# Patient Record
Sex: Male | Born: 1945 | Race: Black or African American | Hispanic: No | Smoking: Former smoker
Health system: Southern US, Community
[De-identification: ages and names within clinical notes are randomized; demographics above are authoritative.]

## PROBLEM LIST (undated history)

## (undated) DIAGNOSIS — R972 Elevated prostate specific antigen [PSA]: Secondary | ICD-10-CM

## (undated) DIAGNOSIS — M899 Disorder of bone, unspecified: Secondary | ICD-10-CM

## (undated) DIAGNOSIS — I10 Essential (primary) hypertension: Secondary | ICD-10-CM

## (undated) DIAGNOSIS — R945 Abnormal results of liver function studies: Secondary | ICD-10-CM

## (undated) DIAGNOSIS — R609 Edema, unspecified: Secondary | ICD-10-CM

## (undated) DIAGNOSIS — H547 Unspecified visual loss: Secondary | ICD-10-CM

## (undated) DIAGNOSIS — E876 Hypokalemia: Secondary | ICD-10-CM

## (undated) DIAGNOSIS — M949 Disorder of cartilage, unspecified: Secondary | ICD-10-CM

## (undated) DIAGNOSIS — H409 Unspecified glaucoma: Secondary | ICD-10-CM

## (undated) HISTORY — DX: Unspecified glaucoma: H40.9

## (undated) HISTORY — DX: Abnormal results of liver function studies: R94.5

## (undated) HISTORY — DX: Disorder of bone, unspecified: M89.9

## (undated) HISTORY — DX: Hypokalemia: E87.6

## (undated) HISTORY — DX: Disorder of cartilage, unspecified: M94.9

## (undated) HISTORY — DX: Edema, unspecified: R60.9

## (undated) HISTORY — DX: Essential (primary) hypertension: I10

## (undated) HISTORY — DX: Elevated prostate specific antigen (PSA): R97.20

## (undated) NOTE — *Deleted (*Deleted)
Pt called EMS complaining of clogged suprapubic catheter, legs swollen. Pt taking keflex, took last dose today. Pt not reporting any pain.  BP 150/98 HR 77 O2 98 % RA, R 12, CBG 99.

---

## 2001-06-05 HISTORY — PX: HERNIA REPAIR: SHX51

## 2007-06-06 HISTORY — PX: EYE SURGERY: SHX253

## 2011-08-11 DIAGNOSIS — H179 Unspecified corneal scar and opacity: Secondary | ICD-10-CM | POA: Diagnosis not present

## 2011-08-11 DIAGNOSIS — H409 Unspecified glaucoma: Secondary | ICD-10-CM | POA: Diagnosis not present

## 2011-08-11 DIAGNOSIS — H538 Other visual disturbances: Secondary | ICD-10-CM | POA: Diagnosis not present

## 2011-08-11 DIAGNOSIS — H4011X0 Primary open-angle glaucoma, stage unspecified: Secondary | ICD-10-CM | POA: Diagnosis not present

## 2011-08-11 DIAGNOSIS — H2510 Age-related nuclear cataract, unspecified eye: Secondary | ICD-10-CM | POA: Diagnosis not present

## 2011-08-14 DIAGNOSIS — H409 Unspecified glaucoma: Secondary | ICD-10-CM | POA: Diagnosis not present

## 2011-08-14 DIAGNOSIS — H2510 Age-related nuclear cataract, unspecified eye: Secondary | ICD-10-CM | POA: Diagnosis not present

## 2011-08-14 DIAGNOSIS — H4011X0 Primary open-angle glaucoma, stage unspecified: Secondary | ICD-10-CM | POA: Diagnosis not present

## 2011-08-14 DIAGNOSIS — H179 Unspecified corneal scar and opacity: Secondary | ICD-10-CM | POA: Diagnosis not present

## 2011-08-14 DIAGNOSIS — H401190 Primary open-angle glaucoma, unspecified eye, stage unspecified: Secondary | ICD-10-CM | POA: Insufficient documentation

## 2011-08-22 DIAGNOSIS — I10 Essential (primary) hypertension: Secondary | ICD-10-CM | POA: Diagnosis not present

## 2011-08-22 DIAGNOSIS — Z1329 Encounter for screening for other suspected endocrine disorder: Secondary | ICD-10-CM | POA: Diagnosis not present

## 2011-08-22 DIAGNOSIS — Z131 Encounter for screening for diabetes mellitus: Secondary | ICD-10-CM | POA: Diagnosis not present

## 2011-08-22 DIAGNOSIS — Z23 Encounter for immunization: Secondary | ICD-10-CM | POA: Diagnosis not present

## 2011-08-22 DIAGNOSIS — Z1211 Encounter for screening for malignant neoplasm of colon: Secondary | ICD-10-CM | POA: Diagnosis not present

## 2011-08-22 DIAGNOSIS — Z833 Family history of diabetes mellitus: Secondary | ICD-10-CM | POA: Diagnosis not present

## 2011-08-22 DIAGNOSIS — R609 Edema, unspecified: Secondary | ICD-10-CM | POA: Diagnosis not present

## 2011-09-05 ENCOUNTER — Other Ambulatory Visit: Payer: Self-pay | Admitting: Internal Medicine

## 2011-09-05 DIAGNOSIS — I10 Essential (primary) hypertension: Secondary | ICD-10-CM | POA: Diagnosis not present

## 2011-09-05 DIAGNOSIS — K760 Fatty (change of) liver, not elsewhere classified: Secondary | ICD-10-CM

## 2011-09-07 ENCOUNTER — Other Ambulatory Visit: Payer: Self-pay | Admitting: Internal Medicine

## 2011-09-07 ENCOUNTER — Ambulatory Visit
Admission: RE | Admit: 2011-09-07 | Discharge: 2011-09-07 | Disposition: A | Discharge status: Home / Self Care | Payer: Medicare Other | Source: Ambulatory Visit | Attending: Internal Medicine | Admitting: Internal Medicine

## 2011-09-07 DIAGNOSIS — K760 Fatty (change of) liver, not elsewhere classified: Secondary | ICD-10-CM

## 2011-09-07 DIAGNOSIS — R7989 Other specified abnormal findings of blood chemistry: Secondary | ICD-10-CM | POA: Diagnosis not present

## 2011-09-19 DIAGNOSIS — R945 Abnormal results of liver function studies: Secondary | ICD-10-CM | POA: Diagnosis not present

## 2011-09-19 DIAGNOSIS — H409 Unspecified glaucoma: Secondary | ICD-10-CM | POA: Diagnosis not present

## 2011-09-19 DIAGNOSIS — R609 Edema, unspecified: Secondary | ICD-10-CM | POA: Diagnosis not present

## 2011-09-19 DIAGNOSIS — I10 Essential (primary) hypertension: Secondary | ICD-10-CM | POA: Diagnosis not present

## 2012-01-02 DIAGNOSIS — R972 Elevated prostate specific antigen [PSA]: Secondary | ICD-10-CM | POA: Diagnosis not present

## 2012-01-02 DIAGNOSIS — E876 Hypokalemia: Secondary | ICD-10-CM | POA: Diagnosis not present

## 2012-01-02 DIAGNOSIS — R609 Edema, unspecified: Secondary | ICD-10-CM | POA: Diagnosis not present

## 2012-01-02 DIAGNOSIS — I10 Essential (primary) hypertension: Secondary | ICD-10-CM | POA: Diagnosis not present

## 2012-06-05 HISTORY — PX: EYE SURGERY: SHX253

## 2012-09-12 DIAGNOSIS — H40159 Residual stage of open-angle glaucoma, unspecified eye: Secondary | ICD-10-CM | POA: Diagnosis not present

## 2012-09-12 DIAGNOSIS — H4011X0 Primary open-angle glaucoma, stage unspecified: Secondary | ICD-10-CM | POA: Diagnosis not present

## 2012-09-12 DIAGNOSIS — H3520 Other non-diabetic proliferative retinopathy, unspecified eye: Secondary | ICD-10-CM | POA: Diagnosis not present

## 2012-09-12 DIAGNOSIS — H35039 Hypertensive retinopathy, unspecified eye: Secondary | ICD-10-CM | POA: Diagnosis not present

## 2012-09-12 DIAGNOSIS — H2510 Age-related nuclear cataract, unspecified eye: Secondary | ICD-10-CM | POA: Diagnosis not present

## 2012-09-12 DIAGNOSIS — H409 Unspecified glaucoma: Secondary | ICD-10-CM | POA: Diagnosis not present

## 2012-09-12 DIAGNOSIS — B0052 Herpesviral keratitis: Secondary | ICD-10-CM | POA: Diagnosis not present

## 2012-09-12 DIAGNOSIS — H35059 Retinal neovascularization, unspecified, unspecified eye: Secondary | ICD-10-CM | POA: Diagnosis not present

## 2012-09-12 DIAGNOSIS — H11159 Pinguecula, unspecified eye: Secondary | ICD-10-CM | POA: Diagnosis not present

## 2012-09-17 ENCOUNTER — Ambulatory Visit (INDEPENDENT_AMBULATORY_CARE_PROVIDER_SITE_OTHER): Payer: Medicare Other | Admitting: Internal Medicine

## 2012-09-17 ENCOUNTER — Encounter: Payer: Self-pay | Admitting: Internal Medicine

## 2012-09-17 VITALS — BP 136/88 | HR 70 | Temp 97.8°F | Resp 15 | Wt 217.4 lb

## 2012-09-17 DIAGNOSIS — R972 Elevated prostate specific antigen [PSA]: Secondary | ICD-10-CM | POA: Diagnosis not present

## 2012-09-17 DIAGNOSIS — R609 Edema, unspecified: Secondary | ICD-10-CM | POA: Diagnosis not present

## 2012-09-17 DIAGNOSIS — I10 Essential (primary) hypertension: Secondary | ICD-10-CM

## 2012-09-17 DIAGNOSIS — Z111 Encounter for screening for respiratory tuberculosis: Secondary | ICD-10-CM

## 2012-09-17 LAB — TB SKIN TEST: Induration: 0 mm

## 2012-09-17 NOTE — Progress Notes (Signed)
  Subjective:    Patient ID: [name] [name]., male    DOB: Jan 06, 1946, 67 y.o.   MRN: [id number]  HPI  He has been having eye problem and follows with Dr Luciana Axe. He is scheduled for surgery and needs PPD placed and read prior to that. On review of dr rankin's lab request form, appears PPD is wanted wit concerns for sarcoidosis changes seen on retinal exam. He has been taking his blood pressure medications and his bp has been under better control. He has swelling in both his legs mainly towards end of day and improves with leg elevation. He denies any chest pain or SOB He has refused lab work and urology follow up with rectal exam for more than a year. He agrees for rectal exam during his physical next visit.  Review of Systems  Constitutional: Negative for fever, chills, diaphoresis, activity change and appetite change.  HENT: Negative for congestion.   Eyes: Negative for pain.  Respiratory: Negative for cough and shortness of breath.   Cardiovascular: Negative for chest pain, palpitations and leg swelling.  Gastrointestinal: Negative for abdominal pain.  Endocrine: Negative for cold intolerance and polydipsia.  Genitourinary: Negative for dysuria.  Musculoskeletal: Negative for arthralgias.  Neurological: Negative for dizziness, syncope and light-headedness.  Hematological: Negative for adenopathy.  Psychiatric/Behavioral: Negative for behavioral problems and agitation.       Objective:   Physical Exam  Constitutional: He is oriented to person, place, and time. He appears well-developed and well-nourished. No distress.  HENT:  Head: Normocephalic and atraumatic.  Mouth/Throat: Oropharynx is clear and moist.  Neck: Normal range of motion. Neck supple. No JVD present. No thyromegaly present.  Cardiovascular: Normal rate and regular rhythm.   Pulmonary/Chest: Effort normal and breath sounds normal.  Abdominal: Soft. Bowel sounds are normal.  Musculoskeletal: Normal range of  motion. He exhibits edema.  Lymphadenopathy:    He has no cervical adenopathy.  Neurological: He is alert and oriented to person, place, and time.  Skin: Skin is warm and dry. He is not diaphoretic.  Psychiatric: He has a normal mood and affect.    BP 136/88  Pulse 70  Temp(Src) 97.8 F (36.6 C) (Oral)  Resp 15  Wt 217 lb 6.4 oz (98.612 kg)      Assessment & Plan:   HTN- continue current dose of hctz, pt agrees for lab work today. With history of hypokalemia and long standing htn, check bmp for renal function and electrolytes. Will also check cbc to rule out anemia  Edema- could from side effect from hctz. No SOB or chest pain. No other cardiac findings on exam. Will check bmp and consider putting him on low dose lasix. Will get ekg on next visit and assess further  Elevated PSA- a year back had elevated PSA, refused rectal exam and self cancelled 2 urology appointment. He agrees for a repeat psa today and rectal exam on his next visit which is for physical Will need to assess for possible mass/malignancy vs BPH  Glaucoma- continue timolol and alphagan for now.  PPD has been placed and will read it on Thursday.  Deranged LFT- recheck lft panel today

## 2012-09-18 LAB — COMPREHENSIVE METABOLIC PANEL
ALT: 59 IU/L — ABNORMAL HIGH (ref 0–44)
AST: 59 IU/L — ABNORMAL HIGH (ref 0–40)
Albumin/Globulin Ratio: 1.4 (ref 1.1–2.5)
Albumin: 4.1 g/dL (ref 3.6–4.8)
Alkaline Phosphatase: 62 IU/L (ref 39–117)
BUN/Creatinine Ratio: 13 (ref 10–22)
BUN: 13 mg/dL (ref 8–27)
CO2: 28 mmol/L (ref 19–28)
Calcium: 9.4 mg/dL (ref 8.6–10.2)
Chloride: 97 mmol/L (ref 97–108)
Creatinine, Ser: 0.97 mg/dL (ref 0.76–1.27)
GFR calc Af Amer: 94 mL/min/{1.73_m2} (ref >59)
GFR calc non Af Amer: 81 mL/min/{1.73_m2} (ref >59)
Globulin, Total: 3 g/dL (ref 1.5–4.5)
Glucose: 94 mg/dL (ref 65–99)
Potassium: 3.8 mmol/L (ref 3.5–5.2)
Sodium: 137 mmol/L (ref 134–144)
Total Bilirubin: 0.8 mg/dL (ref 0.0–1.2)
Total Protein: 7.1 g/dL (ref 6.0–8.5)

## 2012-09-18 LAB — CBC WITH DIFFERENTIAL/PLATELET
Basophils Absolute: 0 10*3/uL (ref 0.0–0.2)
Basos: 0 % (ref 0–3)
Eos: 2 % (ref 0–5)
Eosinophils Absolute: 0.1 10*3/uL (ref 0.0–0.4)
HCT: 41.3 % (ref 37.5–51.0)
Hemoglobin: 14.2 g/dL (ref 12.6–17.7)
Immature Grans (Abs): 0 10*3/uL (ref 0.0–0.1)
Immature Granulocytes: 0 % (ref 0–2)
Lymphocytes Absolute: 2 10*3/uL (ref 0.7–3.1)
Lymphs: 37 % (ref 14–46)
MCH: 31.8 pg (ref 26.6–33.0)
MCHC: 34.4 g/dL (ref 31.5–35.7)
MCV: 92 fL (ref 79–97)
Monocytes Absolute: 0.5 10*3/uL (ref 0.1–0.9)
Monocytes: 9 % (ref 4–12)
Neutrophils Absolute: 2.7 10*3/uL (ref 1.4–7.0)
Neutrophils Relative %: 52 % (ref 40–74)
RBC: 4.47 x10E6/uL (ref 4.14–5.80)
RDW: 12.6 % (ref 12.3–15.4)
WBC: 5.3 10*3/uL (ref 3.4–10.8)

## 2012-09-18 LAB — PSA: PSA: 14.1 ng/mL — ABNORMAL HIGH (ref 0.0–4.0)

## 2012-09-23 DIAGNOSIS — H3520 Other non-diabetic proliferative retinopathy, unspecified eye: Secondary | ICD-10-CM | POA: Diagnosis not present

## 2012-10-02 ENCOUNTER — Telehealth: Payer: Self-pay | Admitting: *Deleted

## 2012-10-02 NOTE — Telephone Encounter (Signed)
Called patient to make him aware of his appointment at [facility] # [phone] on 10/23/2012 @ 8:15am and told patient that they told me he had already Canceled 2 previous appointment and this would be his last appointment scheduled. He stated that I shouldn't dictate that he couldn't cancel the appointment and that he wanted it else where. Told patient that this was the only [facility] in [place] and he stated that is couldn't be.Wanted to argue about why he had to go and I told him that his PSA was elevated and he needed to get it evaluated and he stated that it always ran high and that was not abnormal. Also said he was driving and could not take the information down. I told patient that I would mail him the appointment. He agreed. Mailed appointment today.

## 2012-10-03 ENCOUNTER — Telehealth: Payer: Self-pay | Admitting: *Deleted

## 2012-10-03 NOTE — Telephone Encounter (Signed)
Patient called and stated that her have two eye appointment bills that he has to pay, so he canceled his appointment with the Urologist

## 2012-10-03 NOTE — Telephone Encounter (Signed)
He needs to keep the appointment given his rising PSA level. If he is refusing, we can get renal ultrasound with comment being to evaluate size of prostate if patient agrees. thanks

## 2012-10-03 NOTE — Telephone Encounter (Signed)
LMOVM  To call back

## 2012-10-04 NOTE — Telephone Encounter (Signed)
LMOVM to call back 

## 2012-10-07 NOTE — Telephone Encounter (Signed)
LMOVM to call back 

## 2012-10-08 NOTE — Telephone Encounter (Signed)
Per Dr Glade Lloyd I do not have too contact patient any more since I have tried calling him 4 times and left messages all four times

## 2012-10-08 NOTE — Telephone Encounter (Signed)
LMOVM to call back 

## 2012-10-09 ENCOUNTER — Ambulatory Visit: Payer: Self-pay | Admitting: Internal Medicine

## 2012-10-14 DIAGNOSIS — H409 Unspecified glaucoma: Secondary | ICD-10-CM | POA: Diagnosis not present

## 2012-10-14 DIAGNOSIS — H179 Unspecified corneal scar and opacity: Secondary | ICD-10-CM | POA: Diagnosis not present

## 2012-10-14 DIAGNOSIS — H40159 Residual stage of open-angle glaucoma, unspecified eye: Secondary | ICD-10-CM | POA: Diagnosis not present

## 2012-10-14 DIAGNOSIS — H4011X0 Primary open-angle glaucoma, stage unspecified: Secondary | ICD-10-CM | POA: Diagnosis not present

## 2012-12-17 ENCOUNTER — Encounter (HOSPITAL_COMMUNITY): Payer: Self-pay

## 2012-12-17 ENCOUNTER — Emergency Department (HOSPITAL_COMMUNITY)
Admission: EM | Admit: 2012-12-17 | Discharge: 2012-12-17 | Disposition: A | Discharge status: Home / Self Care | Payer: Medicare Other | Attending: Emergency Medicine | Admitting: Emergency Medicine

## 2012-12-17 DIAGNOSIS — Z87448 Personal history of other diseases of urinary system: Secondary | ICD-10-CM | POA: Insufficient documentation

## 2012-12-17 DIAGNOSIS — Z79899 Other long term (current) drug therapy: Secondary | ICD-10-CM | POA: Diagnosis not present

## 2012-12-17 DIAGNOSIS — R339 Retention of urine, unspecified: Secondary | ICD-10-CM

## 2012-12-17 DIAGNOSIS — N390 Urinary tract infection, site not specified: Secondary | ICD-10-CM | POA: Diagnosis not present

## 2012-12-17 DIAGNOSIS — Z8719 Personal history of other diseases of the digestive system: Secondary | ICD-10-CM | POA: Insufficient documentation

## 2012-12-17 DIAGNOSIS — R319 Hematuria, unspecified: Secondary | ICD-10-CM | POA: Diagnosis not present

## 2012-12-17 DIAGNOSIS — Z8739 Personal history of other diseases of the musculoskeletal system and connective tissue: Secondary | ICD-10-CM | POA: Insufficient documentation

## 2012-12-17 DIAGNOSIS — H409 Unspecified glaucoma: Secondary | ICD-10-CM | POA: Insufficient documentation

## 2012-12-17 DIAGNOSIS — R338 Other retention of urine: Secondary | ICD-10-CM | POA: Diagnosis not present

## 2012-12-17 DIAGNOSIS — I10 Essential (primary) hypertension: Secondary | ICD-10-CM | POA: Insufficient documentation

## 2012-12-17 DIAGNOSIS — Z87891 Personal history of nicotine dependence: Secondary | ICD-10-CM | POA: Diagnosis not present

## 2012-12-17 DIAGNOSIS — Z8679 Personal history of other diseases of the circulatory system: Secondary | ICD-10-CM | POA: Insufficient documentation

## 2012-12-17 LAB — URINALYSIS, ROUTINE W REFLEX MICROSCOPIC
Glucose, UA: 100 mg/dL — AB
Ketones, ur: >80 mg/dL — AB
Nitrite: POSITIVE — AB
Protein, ur: >300 mg/dL — AB
Specific Gravity, Urine: 1.031 — ABNORMAL HIGH (ref 1.005–1.030)
Urobilinogen, UA: 4 mg/dL — ABNORMAL HIGH (ref 0.0–1.0)
pH: 5 (ref 5.0–8.0)

## 2012-12-17 LAB — URINE MICROSCOPIC-ADD ON

## 2012-12-17 MED ORDER — HYDROCODONE-ACETAMINOPHEN 5-325 MG PO TABS
1.0000 | ORAL_TABLET | Freq: Once | ORAL | Status: AC
Start: 2012-12-17 — End: 2012-12-17
  Administered 2012-12-17: 1 via ORAL
  Filled 2012-12-17: qty 1

## 2012-12-17 MED ORDER — CEPHALEXIN 500 MG PO CAPS: 500.0000 mg | ORAL_CAPSULE | Freq: Four times a day (QID) | ORAL | Status: DC

## 2012-12-17 MED ORDER — CEPHALEXIN 500 MG PO CAPS
500.0000 mg | ORAL_CAPSULE | Freq: Once | ORAL | Status: AC
  Administered 2012-12-17: 500 mg via ORAL
  Filled 2012-12-17: qty 1

## 2012-12-17 NOTE — ED Notes (Signed)
Foley cath bag changed to a leg bag per patient's request.

## 2012-12-17 NOTE — ED Notes (Signed)
Bladder Scan- 394 ml after voiding x2 with a total of 100 ml voided prior to scan.

## 2012-12-17 NOTE — ED Provider Notes (Signed)
History    CSN: [id number] Arrival date & time 12/17/12  0605  First MD Initiated Contact with Patient 12/17/12 0703     Chief Complaint  Patient presents with  . Hematuria   (Consider location/radiation/quality/duration/timing/severity/associated sxs/prior Treatment) Patient is a 67 y.o. male presenting with hematuria. The history is provided by the patient.  Hematuria This is a new problem. Episode onset: 3 days ago. The problem occurs constantly. The problem has been gradually worsening. Pertinent negatives include no chest pain, no abdominal pain and no shortness of breath. Nothing aggravates the symptoms. Nothing relieves the symptoms. He has tried nothing for the symptoms.  Gross hematuria started 3 days ago.  Associated with frequency and urgency.  Able to void small amounts frequently.  No fevers or abdominal pain.   Past Medical History  Diagnosis Date  . Hypopotassemia   . Elevated prostate specific antigen (PSA)   . Nonspecific abnormal results of liver function study   . Unspecified glaucoma(365.9)   . Hypertension   . Disorder of bone and cartilage, unspecified   . Edema    Past Surgical History  Procedure Laterality Date  . Hernia repair  2003  . Eye surgery  2009   History reviewed. No pertinent family history. History  Substance Use Topics  . Smoking status: Former Games developer  . Smokeless tobacco: Not on file  . Alcohol Use: No    Review of Systems  Constitutional: Negative for fever.  HENT: Negative for congestion.   Respiratory: Negative for cough and shortness of breath.   Cardiovascular: Negative for chest pain.  Gastrointestinal: Negative for nausea, vomiting, abdominal pain and diarrhea.  Genitourinary: Positive for hematuria.  All other systems reviewed and are negative.    Allergies  Review of patient's allergies indicates no known allergies.  Home Medications   Current Outpatient Rx  Name  Route  Sig  Dispense  Refill  . brimonidine  (ALPHAGAN P) 0.1 % SOLN   Ophthalmic   Apply 1 drop to eye daily.         . cholecalciferol (VITAMIN D) 1000 UNITS tablet   Oral   Take 1,000 Units by mouth daily.         . dorzolamide-timolol (COSOPT) 22.3-6.8 MG/ML ophthalmic solution   Both Eyes   Place 1 drop into both eyes 2 (two) times daily.          . folic acid (FOLVITE) 1 MG tablet   Oral   Take 2 mg by mouth daily.         . hydrochlorothiazide (HYDRODIURIL) 25 MG tablet   Oral   Take 25 mg by mouth daily.         . Magnesium 250 MG TABS   Oral   Take 250 mg by mouth daily.         . milk thistle 175 MG tablet   Oral   Take 175 mg by mouth daily.         Marland Kitchen POTASSIUM GLUCONATE PO   Oral   Take 4 tablets by mouth daily.         Marland Kitchen pyridOXINE (VITAMIN B-6) 100 MG tablet   Oral   Take 100 mg by mouth daily.         . vitamin C (ASCORBIC ACID) 500 MG tablet   Oral   Take 1,000 mg by mouth daily.          BP 143/82  Pulse 83  Temp(Src) 98.2 F (36.8 C) (  Oral)  Resp 18  SpO2 100% Physical Exam  Nursing note and vitals reviewed. Constitutional: He is oriented to person, place, and time. He appears well-developed and well-nourished. No distress.  HENT:  Head: Normocephalic and atraumatic.  Mouth/Throat: Oropharynx is clear and moist.  Eyes: Conjunctivae are normal. Pupils are equal, round, and reactive to light. No scleral icterus.  Neck: Neck supple.  Cardiovascular: Normal rate, regular rhythm, normal heart sounds and intact distal pulses.   No murmur heard. Pulmonary/Chest: Effort normal and breath sounds normal. No stridor. No respiratory distress. He has no wheezes. He has no rales.  Abdominal: Soft. He exhibits no distension. There is no tenderness.  Genitourinary: Right testis shows mass. Right testis shows no swelling and no tenderness. Left testis shows no mass, no swelling and no tenderness.  Blood at urethral meatus  Musculoskeletal: Normal range of motion. He exhibits no  edema.  Neurological: He is alert and oriented to person, place, and time.  Skin: Skin is warm and dry. No rash noted.  Psychiatric: He has a normal mood and affect. His behavior is normal.    ED Course  Procedures (including critical care time) Labs Reviewed  URINALYSIS, ROUTINE W REFLEX MICROSCOPIC - Abnormal; Notable for the following:    Color, Urine RED (*)    APPearance TURBID (*)    Specific Gravity, Urine 1.031 (*)    Glucose, UA 100 (*)    Hgb urine dipstick LARGE (*)    Bilirubin Urine LARGE (*)    Ketones, ur >80 (*)    Protein, ur >300 (*)    Urobilinogen, UA 4.0 (*)    Nitrite POSITIVE (*)    Leukocytes, UA LARGE (*)    All other components within normal limits  URINE MICROSCOPIC-ADD ON   No results found. 1. Hematuria   2. Urinary retention   3. UTI (urinary tract infection)     MDM  Gross hematuria for 3 days.  UA shows blood, leuks, and nitrites.  Along with urgency and frequency, likely represents hemorrhagic cystitis.  Plan to bladder scan to ensure adequate voiding, then treat with keflex.  He will need urology follow up.    9:31 AM post void residual approx 400cc.  Foley placed with good return and improvement in urinary urgency.  I discussed case with Dr. Patsi Sears (Urology) to secure outpatient follow up.  Pt remained well appearing and was discharged with foley in place.   Candyce Churn, MD 12/17/12 [phone]

## 2012-12-17 NOTE — ED Notes (Signed)
[id number]<GU> Expected date:<BR> Expected time:<BR> Means of arrival:<BR> Comments:<BR> EMS/66 yo male urinating blood

## 2012-12-17 NOTE — ED Notes (Signed)
Pt has very limited eyesight

## 2012-12-17 NOTE — ED Notes (Signed)
Pt states that he's bee urinating dark magenta color blood for three days, no complaints of any pain.

## 2012-12-18 ENCOUNTER — Emergency Department (HOSPITAL_COMMUNITY)
Admission: EM | Admit: 2012-12-18 | Discharge: 2012-12-19 | Disposition: A | Discharge status: Home / Self Care | Payer: Medicare Other | Attending: Emergency Medicine | Admitting: Emergency Medicine

## 2012-12-18 ENCOUNTER — Encounter (HOSPITAL_COMMUNITY): Payer: Self-pay | Admitting: Emergency Medicine

## 2012-12-18 DIAGNOSIS — I10 Essential (primary) hypertension: Secondary | ICD-10-CM | POA: Diagnosis not present

## 2012-12-18 DIAGNOSIS — R141 Gas pain: Secondary | ICD-10-CM | POA: Insufficient documentation

## 2012-12-18 DIAGNOSIS — Z79899 Other long term (current) drug therapy: Secondary | ICD-10-CM | POA: Insufficient documentation

## 2012-12-18 DIAGNOSIS — Z8739 Personal history of other diseases of the musculoskeletal system and connective tissue: Secondary | ICD-10-CM | POA: Insufficient documentation

## 2012-12-18 DIAGNOSIS — R109 Unspecified abdominal pain: Secondary | ICD-10-CM | POA: Insufficient documentation

## 2012-12-18 DIAGNOSIS — E876 Hypokalemia: Secondary | ICD-10-CM | POA: Insufficient documentation

## 2012-12-18 DIAGNOSIS — R3915 Urgency of urination: Secondary | ICD-10-CM | POA: Insufficient documentation

## 2012-12-18 DIAGNOSIS — Z8669 Personal history of other diseases of the nervous system and sense organs: Secondary | ICD-10-CM | POA: Insufficient documentation

## 2012-12-18 DIAGNOSIS — R339 Retention of urine, unspecified: Secondary | ICD-10-CM | POA: Insufficient documentation

## 2012-12-18 DIAGNOSIS — Z87891 Personal history of nicotine dependence: Secondary | ICD-10-CM | POA: Diagnosis not present

## 2012-12-18 DIAGNOSIS — R319 Hematuria, unspecified: Secondary | ICD-10-CM | POA: Diagnosis not present

## 2012-12-18 DIAGNOSIS — R142 Eructation: Secondary | ICD-10-CM | POA: Insufficient documentation

## 2012-12-18 HISTORY — DX: Unspecified visual loss: H54.7

## 2012-12-18 NOTE — ED Notes (Addendum)
PT. REPORTS URINARY RETENTION /UNABLE TO URINATE WITH BLADDER PRESSURE ONSET THIS MORNING . PT. IS BLIND .

## 2012-12-19 NOTE — ED Notes (Signed)
Pt states understanding of discharge instructions 

## 2012-12-19 NOTE — ED Provider Notes (Signed)
History    CSN: [id number] Arrival date & time 12/18/12  2337  First MD Initiated Contact with Patient 12/19/12 0009     Chief Complaint  Patient presents with  . Urinary Retention   (Consider location/radiation/quality/duration/timing/severity/associated sxs/prior Treatment) HPI Comments: Pt comes in with cc of urinary retention. Seen on 7/15 with hematuria, noted to have urinary retention and sent home with a urinary catheter and Urology f/u. Today he noted that the foley stopped draining, and so he cut the catheter, and removed the foley. The retention hasnt resolved, he continues to have the discomfort, and so he came to the ED.  no n/v/f/c.   The history is provided by the patient.   Past Medical History  Diagnosis Date  . Hypopotassemia   . Elevated prostate specific antigen (PSA)   . Nonspecific abnormal results of liver function study   . Unspecified glaucoma(365.9)   . Hypertension   . Disorder of bone and cartilage, unspecified   . Edema   . Blind    Past Surgical History  Procedure Laterality Date  . Hernia repair  2003  . Eye surgery  2009   No family history on file. History  Substance Use Topics  . Smoking status: Former Games developer  . Smokeless tobacco: Not on file  . Alcohol Use: No    Review of Systems  Constitutional: Positive for activity change. Negative for fever and chills.  Gastrointestinal: Positive for abdominal pain and abdominal distention.  Genitourinary: Positive for urgency, hematuria and decreased urine volume. Negative for dysuria and flank pain.  Musculoskeletal: Positive for back pain.    Allergies  Review of patient's allergies indicates no known allergies.  Home Medications   Current Outpatient Rx  Name  Route  Sig  Dispense  Refill  . brimonidine (ALPHAGAN P) 0.1 % SOLN   Both Eyes   Place 1 drop into both eyes daily.          . cephALEXin (KEFLEX) 500 MG capsule   Oral   Take 1 capsule (500 mg total) by mouth 4 (four)  times daily.   40 capsule   0   . cholecalciferol (VITAMIN D) 1000 UNITS tablet   Oral   Take 1,000 Units by mouth daily.         . dorzolamide-timolol (COSOPT) 22.3-6.8 MG/ML ophthalmic solution   Both Eyes   Place 1 drop into both eyes 2 (two) times daily.          . folic acid (FOLVITE) 1 MG tablet   Oral   Take 2 mg by mouth daily.         . hydrochlorothiazide (HYDRODIURIL) 25 MG tablet   Oral   Take 25 mg by mouth daily.         . Magnesium 250 MG TABS   Oral   Take 250 mg by mouth daily.         . milk thistle 175 MG tablet   Oral   Take 175 mg by mouth daily.         Marland Kitchen POTASSIUM GLUCONATE PO   Oral   Take 4 tablets by mouth daily.         Marland Kitchen pyridOXINE (VITAMIN B-6) 100 MG tablet   Oral   Take 100 mg by mouth daily.         . vitamin C (ASCORBIC ACID) 500 MG tablet   Oral   Take 1,000 mg by mouth daily.  BP 163/84  Pulse 77  Temp(Src) 98.1 F (36.7 C) (Oral)  Resp 20  SpO2 96% Physical Exam  Nursing note and vitals reviewed. Constitutional: He appears well-developed and well-nourished.  HENT:  Head: Normocephalic and atraumatic.  Eyes: Pupils are equal, round, and reactive to light.  Neck: Neck supple. No JVD present.  Cardiovascular: Normal rate and regular rhythm.   Pulmonary/Chest: No respiratory distress.  Abdominal: Soft. He exhibits distension. There is tenderness.  Diffuse tenderness, left sided hernia, reducible.    ED Course  Procedures (including critical care time) Labs Reviewed - No data to display No results found. 1. Urinary retention     MDM  Pt came in with cc of urinary retention.  Last urine drainage was at 9 am. He removed his own foley, which was placed for heamturia, as it was not draining. Bladder scan showed residual of > 900 cc.  New foley placed, with good output, and patient discharged. To see Urology as planned.    Derwood Kaplan, MD 12/19/12 0126

## 2012-12-20 ENCOUNTER — Encounter (HOSPITAL_COMMUNITY): Payer: Self-pay | Admitting: Family Medicine

## 2012-12-20 ENCOUNTER — Telehealth (HOSPITAL_COMMUNITY): Payer: Self-pay | Admitting: *Deleted

## 2012-12-20 ENCOUNTER — Emergency Department (HOSPITAL_COMMUNITY)
Admission: EM | Admit: 2012-12-20 | Discharge: 2012-12-20 | Disposition: A | Discharge status: Home / Self Care | Payer: Medicare Other | Attending: Emergency Medicine | Admitting: Emergency Medicine

## 2012-12-20 DIAGNOSIS — M7989 Other specified soft tissue disorders: Secondary | ICD-10-CM | POA: Insufficient documentation

## 2012-12-20 DIAGNOSIS — T83091A Other mechanical complication of indwelling urethral catheter, initial encounter: Secondary | ICD-10-CM | POA: Diagnosis not present

## 2012-12-20 DIAGNOSIS — Z8739 Personal history of other diseases of the musculoskeletal system and connective tissue: Secondary | ICD-10-CM | POA: Diagnosis not present

## 2012-12-20 DIAGNOSIS — R339 Retention of urine, unspecified: Secondary | ICD-10-CM | POA: Diagnosis not present

## 2012-12-20 DIAGNOSIS — R310 Gross hematuria: Secondary | ICD-10-CM | POA: Insufficient documentation

## 2012-12-20 DIAGNOSIS — I10 Essential (primary) hypertension: Secondary | ICD-10-CM | POA: Insufficient documentation

## 2012-12-20 DIAGNOSIS — Z87891 Personal history of nicotine dependence: Secondary | ICD-10-CM | POA: Insufficient documentation

## 2012-12-20 DIAGNOSIS — Z79899 Other long term (current) drug therapy: Secondary | ICD-10-CM | POA: Diagnosis not present

## 2012-12-20 DIAGNOSIS — E876 Hypokalemia: Secondary | ICD-10-CM | POA: Diagnosis not present

## 2012-12-20 DIAGNOSIS — Y846 Urinary catheterization as the cause of abnormal reaction of the patient, or of later complication, without mention of misadventure at the time of the procedure: Secondary | ICD-10-CM | POA: Insufficient documentation

## 2012-12-20 DIAGNOSIS — Z8669 Personal history of other diseases of the nervous system and sense organs: Secondary | ICD-10-CM | POA: Diagnosis not present

## 2012-12-20 MED ORDER — HYDROCODONE-ACETAMINOPHEN 5-325 MG PO TABS: 2.0000 | ORAL_TABLET | ORAL | Status: DC | PRN

## 2012-12-20 NOTE — ED Notes (Signed)
Dr. Ignacia Palma at beside.

## 2012-12-20 NOTE — ED Notes (Signed)
Pt c/o clogged urinary catheter. Pt states he received a catheter 3 days ago, began having some issues with the catheter and noticed he had no urine in his catheter and had some pain and then about ago he began having urinary flow. Pt states he had some blood and clots in his urine.

## 2012-12-20 NOTE — ED Notes (Addendum)
Per pt noticed catheter was clogged this am. sts urine is not flowing because blood clots.

## 2012-12-20 NOTE — ED Provider Notes (Signed)
History    CSN: [id number] Arrival date & time 12/20/12  1118  First MD Initiated Contact with Patient 12/20/12 1341     Chief Complaint  Patient presents with  . catheter issues    (Consider location/radiation/quality/duration/timing/severity/associated sxs/prior Treatment) Patient is a 67 y.o. male presenting with male genitourinary complaint. The history is provided by the patient. No language interpreter was used.  Male GU Problem Presenting symptoms comment:  Pt has urinary retention, a problem that has been ongoing since July 15, when seen for it and treated with Foley catheter.  He has had to return for catheter change due to blockage by clots on July 16, and now returns with same problem for several hours.  He has an appointment to be seen at [facility] on July 24. Relieved by:  Nothing Worsened by:  Nothing tried Ineffective treatments:  None tried Associated symptoms: hematuria and urinary retention   Associated symptoms: no fever and no urinary frequency   Risk factors: urinary catheter    Past Medical History  Diagnosis Date  . Hypopotassemia   . Elevated prostate specific antigen (PSA)   . Nonspecific abnormal results of liver function study   . Unspecified glaucoma(365.9)   . Hypertension   . Disorder of bone and cartilage, unspecified   . Edema   . Blind    Past Surgical History  Procedure Laterality Date  . Hernia repair  2003  . Eye surgery  2009   History reviewed. No pertinent family history. History  Substance Use Topics  . Smoking status: Former Games developer  . Smokeless tobacco: Not on file  . Alcohol Use: No    Review of Systems  Constitutional: Negative for fever and chills.       He is on Keflex to prevent UTI.  HENT: Negative.   Eyes: Negative.   Respiratory: Negative.   Cardiovascular: Positive for leg swelling.  Gastrointestinal: Negative.   Genitourinary: Positive for hematuria and difficulty urinating. Negative for frequency.    Urinary retention despite foley catheter.  Musculoskeletal: Negative.   Skin: Negative.   Neurological: Negative.   Psychiatric/Behavioral: Negative.     Allergies  Review of patient's allergies indicates no known allergies.  Home Medications   Current Outpatient Rx  Name  Route  Sig  Dispense  Refill  . brimonidine (ALPHAGAN P) 0.1 % SOLN   Both Eyes   Place 1 drop into both eyes daily.          . cephALEXin (KEFLEX) 500 MG capsule   Oral   Take 1 capsule (500 mg total) by mouth 4 (four) times daily.   40 capsule   0   . cholecalciferol (VITAMIN D) 1000 UNITS tablet   Oral   Take 1,000 Units by mouth daily.         . dorzolamide-timolol (COSOPT) 22.3-6.8 MG/ML ophthalmic solution   Both Eyes   Place 1 drop into both eyes 2 (two) times daily.          . folic acid (FOLVITE) 1 MG tablet   Oral   Take 2 mg by mouth daily.         . hydrochlorothiazide (HYDRODIURIL) 25 MG tablet   Oral   Take 25 mg by mouth daily.         . Magnesium 250 MG TABS   Oral   Take 250 mg by mouth daily.         . milk thistle 175 MG tablet   Oral  Take 175 mg by mouth daily.         Marland Kitchen POTASSIUM GLUCONATE PO   Oral   Take 4 tablets by mouth daily.         Marland Kitchen pyridOXINE (VITAMIN B-6) 100 MG tablet   Oral   Take 100 mg by mouth daily.         . vitamin C (ASCORBIC ACID) 500 MG tablet   Oral   Take 1,000 mg by mouth daily.          BP 184/92  Pulse 78  Temp(Src) 98.5 F (36.9 C) (Oral)  Resp 16  SpO2 98% Physical Exam  Nursing note and vitals reviewed. Constitutional: He is oriented to person, place, and time. He appears well-developed and well-nourished.  No current distress as his catheter became unclogged spontaneously.  His catheter bag is filled with dark bloody urine and clots.  HENT:  Head: Normocephalic and atraumatic.  Right Ear: External ear normal.  Left Ear: External ear normal.  Mouth/Throat: Oropharynx is clear and moist.  Eyes:  Conjunctivae are normal. Pupils are equal, round, and reactive to light. No scleral icterus.  Neck: Normal range of motion.  Cardiovascular: Normal rate, regular rhythm and normal heart sounds.   Pulmonary/Chest: Effort normal and breath sounds normal.  Abdominal: Soft. Bowel sounds are normal.  Genitourinary:  Has indwelling Foley catheter and leg bag.    Musculoskeletal:  3+ bilateral ankle and lower leg edema, chronic.  Neurological: He is alert and oriented to person, place, and time.  No sensory or motor deficit.  Skin: Skin is warm and dry.  Psychiatric: He has a normal mood and affect. His behavior is normal.    ED Course  Procedures (including critical care time)  Procedure:  Bladder irrigation. Pt's foley catheter and bladder was irrigated with 2 liters normal saline using piston syringe.  Multiple clots washed out until irrigating fluid had no further clots.  Called [facility], got pt an appointment to be seen next Monday, July 21, at 8 A.MJovita Gamma Rx for hydrocodone-acetaminophen, but got call from Flow Manager, who advised me that pt had altered the prescription, giving himself five refills.  Cancelled prescription.   1. Urinary retention   2. Gross hematuria      Carleene Cooper III, MD 12/21/12 [phone]

## 2012-12-21 ENCOUNTER — Encounter (HOSPITAL_COMMUNITY): Payer: Self-pay | Admitting: Emergency Medicine

## 2012-12-21 ENCOUNTER — Emergency Department (HOSPITAL_COMMUNITY)
Admission: EM | Admit: 2012-12-21 | Discharge: 2012-12-22 | Disposition: A | Discharge status: Home / Self Care | Payer: Medicare Other | Attending: Emergency Medicine | Admitting: Emergency Medicine

## 2012-12-21 DIAGNOSIS — Z79899 Other long term (current) drug therapy: Secondary | ICD-10-CM | POA: Insufficient documentation

## 2012-12-21 DIAGNOSIS — M7989 Other specified soft tissue disorders: Secondary | ICD-10-CM | POA: Diagnosis not present

## 2012-12-21 DIAGNOSIS — T83091A Other mechanical complication of indwelling urethral catheter, initial encounter: Secondary | ICD-10-CM | POA: Diagnosis not present

## 2012-12-21 DIAGNOSIS — T83091D Other mechanical complication of indwelling urethral catheter, subsequent encounter: Secondary | ICD-10-CM

## 2012-12-21 DIAGNOSIS — Y838 Other surgical procedures as the cause of abnormal reaction of the patient, or of later complication, without mention of misadventure at the time of the procedure: Secondary | ICD-10-CM | POA: Insufficient documentation

## 2012-12-21 DIAGNOSIS — I10 Essential (primary) hypertension: Secondary | ICD-10-CM | POA: Insufficient documentation

## 2012-12-21 DIAGNOSIS — Z87891 Personal history of nicotine dependence: Secondary | ICD-10-CM | POA: Insufficient documentation

## 2012-12-21 DIAGNOSIS — Z8669 Personal history of other diseases of the nervous system and sense organs: Secondary | ICD-10-CM | POA: Insufficient documentation

## 2012-12-21 DIAGNOSIS — E876 Hypokalemia: Secondary | ICD-10-CM | POA: Insufficient documentation

## 2012-12-21 DIAGNOSIS — Z9889 Other specified postprocedural states: Secondary | ICD-10-CM | POA: Insufficient documentation

## 2012-12-21 DIAGNOSIS — Z8739 Personal history of other diseases of the musculoskeletal system and connective tissue: Secondary | ICD-10-CM | POA: Insufficient documentation

## 2012-12-21 MED ORDER — HYDROCODONE-ACETAMINOPHEN 5-325 MG PO TABS
2.0000 | ORAL_TABLET | Freq: Once | ORAL | Status: AC
  Administered 2012-12-22: 2 via ORAL
  Filled 2012-12-21: qty 2

## 2012-12-21 NOTE — ED Notes (Signed)
Pt reports that his catheter has not had any drainage since 0700. Pt states he has a history of having an enlarged prostate and also clots, which has caused an obstructed catheter in the past. Pt reports increased pressure to void. Pt has leg drainage bag in place. Pt has moderately severe edema to his bilateral lower extremities, pt states normally the swelling goes down when he sleeps and elevates his feet, however he has been unable to sleep due to the urinary pressure. Pt is A/O, respirations equal and unlabored. NAD.

## 2012-12-21 NOTE — ED Notes (Signed)
Dr. Norlene Campbell made aware of the catheter irrigation.

## 2012-12-22 NOTE — ED Provider Notes (Signed)
History    CSN: [id number] Arrival date & time 12/21/12  2220  First MD Initiated Contact with Patient 12/21/12 2301     Chief Complaint  Patient presents with  . Urinary Retention   (Consider location/radiation/quality/duration/timing/severity/associated sxs/prior Treatment) HPI 67 yo male presents to the ER with dysfunctioning foley catheter.  Pt had catheter placed on 7/15.  He returned on 7/16, and again on 7/18.  Pt reports he has not urinated since 7 am.  Nursing staff has irrigated the foley and he is now without complaints with good return of urine.   Past Medical History  Diagnosis Date  . Hypopotassemia   . Elevated prostate specific antigen (PSA)   . Nonspecific abnormal results of liver function study   . Unspecified glaucoma(365.9)   . Hypertension   . Disorder of bone and cartilage, unspecified   . Edema   . Blind    Past Surgical History  Procedure Laterality Date  . Hernia repair  2003  . Eye surgery  2009   No family history on file. History  Substance Use Topics  . Smoking status: Former Games developer  . Smokeless tobacco: Not on file  . Alcohol Use: No    Review of Systems  Cardiovascular: Positive for leg swelling (worse over the last week as he has not been able to elevate legs).  All other systems reviewed and are negative.    Allergies  Review of patient's allergies indicates no known allergies.  Home Medications   Current Outpatient Rx  Name  Route  Sig  Dispense  Refill  . brimonidine (ALPHAGAN P) 0.1 % SOLN   Both Eyes   Place 1 drop into both eyes 2 (two) times daily.          . cephALEXin (KEFLEX) 500 MG capsule   Oral   Take 1 capsule (500 mg total) by mouth 4 (four) times daily.   40 capsule   0   . dorzolamide-timolol (COSOPT) 22.3-6.8 MG/ML ophthalmic solution   Both Eyes   Place 1 drop into both eyes 2 (two) times daily.          . folic acid (FOLVITE) 1 MG tablet   Oral   Take 2 mg by mouth every morning.           . hydrochlorothiazide (HYDRODIURIL) 25 MG tablet   Oral   Take 25 mg by mouth every morning.          . potassium gluconate 595 MG TABS   Oral   Take 595 mg by mouth every morning.         Marland Kitchen HYDROcodone-acetaminophen (NORCO/VICODIN) 5-325 MG per tablet   Oral   Take 2 tablets by mouth every 4 (four) hours as needed for pain.   20 tablet   0    BP 163/90  Pulse 80  Temp(Src) 98.3 F (36.8 C) (Oral)  Resp 18  Ht 5\' 8"  (1.727 m)  Wt 209 lb 6.4 oz (94.983 kg)  BMI 31.85 kg/m2  SpO2 99% Physical Exam  Nursing note and vitals reviewed. Constitutional: He appears well-developed and well-nourished. No distress.  HENT:  Head: Normocephalic and atraumatic.  Neck: Normal range of motion. Neck supple. No JVD present. No tracheal deviation present. No thyromegaly present.  Cardiovascular: Normal rate, regular rhythm, normal heart sounds and intact distal pulses.  Exam reveals no gallop and no friction rub.   No murmur heard. Pulmonary/Chest: Effort normal and breath sounds normal. No stridor. No respiratory  distress. He has no wheezes. He has no rales. He exhibits no tenderness.  Abdominal: Soft. Bowel sounds are normal. He exhibits no distension and no mass. There is no tenderness. There is no rebound and no guarding.  Genitourinary:  Foley draining brown urine  Musculoskeletal: Normal range of motion. He exhibits edema. He exhibits no tenderness.  Lymphadenopathy:    He has no cervical adenopathy.  Skin: Skin is warm and dry. No rash noted. No erythema. No pallor.    ED Course  Procedures (including critical care time) Labs Reviewed - No data to display No results found. 1. Obstruction of Foley catheter, subsequent encounter     MDM  67 yo male with obstructed foley.  Of note, pt is very sorry for having changed his norco prescription given to him 2 days ago.  He has appt with urology on Monday.  Olivia Mackie, MD 12/22/12 [phone]

## 2012-12-23 ENCOUNTER — Inpatient Hospital Stay (HOSPITAL_COMMUNITY)
Admission: EM | Admit: 2012-12-23 | Discharge: 2012-12-25 | DRG: 342 | Disposition: A | Discharge status: Home / Self Care | Payer: Medicare Other | Attending: Internal Medicine | Admitting: Internal Medicine

## 2012-12-23 ENCOUNTER — Observation Stay (HOSPITAL_COMMUNITY): Payer: Medicare Other

## 2012-12-23 ENCOUNTER — Encounter (HOSPITAL_COMMUNITY): Payer: Self-pay | Admitting: *Deleted

## 2012-12-23 DIAGNOSIS — R310 Gross hematuria: Secondary | ICD-10-CM | POA: Diagnosis not present

## 2012-12-23 DIAGNOSIS — R319 Hematuria, unspecified: Secondary | ICD-10-CM

## 2012-12-23 DIAGNOSIS — D649 Anemia, unspecified: Secondary | ICD-10-CM

## 2012-12-23 DIAGNOSIS — Z87891 Personal history of nicotine dependence: Secondary | ICD-10-CM | POA: Diagnosis not present

## 2012-12-23 DIAGNOSIS — N281 Cyst of kidney, acquired: Secondary | ICD-10-CM | POA: Diagnosis present

## 2012-12-23 DIAGNOSIS — R079 Chest pain, unspecified: Secondary | ICD-10-CM | POA: Diagnosis not present

## 2012-12-23 DIAGNOSIS — N200 Calculus of kidney: Secondary | ICD-10-CM | POA: Diagnosis present

## 2012-12-23 DIAGNOSIS — M949 Disorder of cartilage, unspecified: Secondary | ICD-10-CM | POA: Diagnosis present

## 2012-12-23 DIAGNOSIS — H409 Unspecified glaucoma: Secondary | ICD-10-CM | POA: Diagnosis present

## 2012-12-23 DIAGNOSIS — I10 Essential (primary) hypertension: Secondary | ICD-10-CM | POA: Diagnosis present

## 2012-12-23 DIAGNOSIS — D126 Benign neoplasm of colon, unspecified: Secondary | ICD-10-CM | POA: Diagnosis not present

## 2012-12-23 DIAGNOSIS — Q820 Hereditary lymphedema: Secondary | ICD-10-CM | POA: Diagnosis not present

## 2012-12-23 DIAGNOSIS — N390 Urinary tract infection, site not specified: Secondary | ICD-10-CM | POA: Diagnosis present

## 2012-12-23 DIAGNOSIS — E871 Hypo-osmolality and hyponatremia: Secondary | ICD-10-CM | POA: Diagnosis not present

## 2012-12-23 DIAGNOSIS — N401 Enlarged prostate with lower urinary tract symptoms: Secondary | ICD-10-CM | POA: Diagnosis present

## 2012-12-23 DIAGNOSIS — E876 Hypokalemia: Secondary | ICD-10-CM | POA: Diagnosis not present

## 2012-12-23 DIAGNOSIS — H543 Unqualified visual loss, both eyes: Secondary | ICD-10-CM | POA: Diagnosis present

## 2012-12-23 DIAGNOSIS — M899 Disorder of bone, unspecified: Secondary | ICD-10-CM | POA: Diagnosis present

## 2012-12-23 DIAGNOSIS — D62 Acute posthemorrhagic anemia: Secondary | ICD-10-CM | POA: Diagnosis present

## 2012-12-23 DIAGNOSIS — R072 Precordial pain: Secondary | ICD-10-CM | POA: Diagnosis not present

## 2012-12-23 DIAGNOSIS — R609 Edema, unspecified: Secondary | ICD-10-CM | POA: Diagnosis present

## 2012-12-23 DIAGNOSIS — K389 Disease of appendix, unspecified: Principal | ICD-10-CM | POA: Diagnosis present

## 2012-12-23 DIAGNOSIS — R339 Retention of urine, unspecified: Secondary | ICD-10-CM | POA: Diagnosis present

## 2012-12-23 DIAGNOSIS — R972 Elevated prostate specific antigen [PSA]: Secondary | ICD-10-CM | POA: Diagnosis present

## 2012-12-23 DIAGNOSIS — N138 Other obstructive and reflux uropathy: Secondary | ICD-10-CM | POA: Diagnosis present

## 2012-12-23 DIAGNOSIS — N179 Acute kidney failure, unspecified: Secondary | ICD-10-CM | POA: Diagnosis present

## 2012-12-23 DIAGNOSIS — C61 Malignant neoplasm of prostate: Secondary | ICD-10-CM | POA: Diagnosis present

## 2012-12-23 DIAGNOSIS — N400 Enlarged prostate without lower urinary tract symptoms: Secondary | ICD-10-CM | POA: Diagnosis not present

## 2012-12-23 LAB — COMPREHENSIVE METABOLIC PANEL
ALT: 35 U/L (ref 0–53)
AST: 45 U/L — ABNORMAL HIGH (ref 0–37)
Albumin: 3.5 g/dL (ref 3.5–5.2)
Alkaline Phosphatase: 55 U/L (ref 39–117)
BUN: 19 mg/dL (ref 6–23)
CO2: 26 mEq/L (ref 19–32)
Calcium: 8.9 mg/dL (ref 8.4–10.5)
Chloride: 94 mEq/L — ABNORMAL LOW (ref 96–112)
Creatinine, Ser: 1.28 mg/dL (ref 0.50–1.35)
GFR calc Af Amer: 66 mL/min — ABNORMAL LOW (ref >90)
GFR calc non Af Amer: 57 mL/min — ABNORMAL LOW (ref >90)
Glucose, Bld: 108 mg/dL — ABNORMAL HIGH (ref 70–99)
Potassium: 2.8 mEq/L — ABNORMAL LOW (ref 3.5–5.1)
Sodium: 132 mEq/L — ABNORMAL LOW (ref 135–145)
Total Bilirubin: 1.2 mg/dL (ref 0.3–1.2)
Total Protein: 6.7 g/dL (ref 6.0–8.3)

## 2012-12-23 LAB — CBC
HCT: 25.9 % — ABNORMAL LOW (ref 39.0–52.0)
Hemoglobin: 8.9 g/dL — ABNORMAL LOW (ref 13.0–17.0)
MCH: 31.6 pg (ref 26.0–34.0)
MCHC: 34.4 g/dL (ref 30.0–36.0)
MCV: 91.8 fL (ref 78.0–100.0)
Platelets: 224 10*3/uL (ref 150–400)
RBC: 2.82 MIL/uL — ABNORMAL LOW (ref 4.22–5.81)
RDW: 12.8 % (ref 11.5–15.5)
WBC: 6.5 10*3/uL (ref 4.0–10.5)

## 2012-12-23 LAB — APTT: aPTT: 28 seconds (ref 24–37)

## 2012-12-23 LAB — POCT I-STAT, CHEM 8
BUN: 17 mg/dL (ref 6–23)
Calcium, Ion: 1.08 mmol/L — ABNORMAL LOW (ref 1.13–1.30)
Chloride: 97 mEq/L (ref 96–112)
Creatinine, Ser: 1.4 mg/dL — ABNORMAL HIGH (ref 0.50–1.35)
Glucose, Bld: 112 mg/dL — ABNORMAL HIGH (ref 70–99)
HCT: 26 % — ABNORMAL LOW (ref 39.0–52.0)
Hemoglobin: 8.8 g/dL — ABNORMAL LOW (ref 13.0–17.0)
Potassium: 2.9 mEq/L — ABNORMAL LOW (ref 3.5–5.1)
Sodium: 135 mEq/L (ref 135–145)
TCO2: 24 mmol/L (ref 0–100)

## 2012-12-23 LAB — PROTIME-INR
INR: 1.1 (ref 0.00–1.49)
Prothrombin Time: 14 seconds (ref 11.6–15.2)

## 2012-12-23 LAB — TYPE AND SCREEN
ABO/RH(D): B POS
Antibody Screen: NEGATIVE

## 2012-12-23 LAB — ABO/RH: ABO/RH(D): B POS

## 2012-12-23 LAB — TROPONIN I: Troponin I: <0.3 ng/mL (ref <0.30)

## 2012-12-23 LAB — PRO B NATRIURETIC PEPTIDE: Pro B Natriuretic peptide (BNP): 345.6 pg/mL — ABNORMAL HIGH (ref 0–125)

## 2012-12-23 LAB — TSH: TSH: 1.218 u[IU]/mL (ref 0.350–4.500)

## 2012-12-23 MED ORDER — POTASSIUM CHLORIDE CRYS ER 20 MEQ PO TBCR
40.0000 meq | EXTENDED_RELEASE_TABLET | Freq: Once | ORAL | Status: AC
  Administered 2012-12-24: 40 meq via ORAL
  Filled 2012-12-23 (×2): qty 2

## 2012-12-23 MED ORDER — ACETAMINOPHEN 325 MG PO TABS: 650.0000 mg | ORAL_TABLET | Freq: Four times a day (QID) | ORAL | Status: DC | PRN

## 2012-12-23 MED ORDER — SODIUM CHLORIDE 0.9 % IV SOLN
INTRAVENOUS | Status: DC
  Administered 2012-12-23: 11:00:00 via INTRAVENOUS
  Filled 2012-12-23 (×5): qty 1000

## 2012-12-23 MED ORDER — IOHEXOL 300 MG/ML  SOLN
100.0000 mL | Freq: Once | INTRAMUSCULAR | Status: AC | PRN
  Administered 2012-12-23: 100 mL via INTRAVENOUS

## 2012-12-23 MED ORDER — SODIUM CHLORIDE 0.9 % IJ SOLN
3.0000 mL | Freq: Two times a day (BID) | INTRAMUSCULAR | Status: DC
  Administered 2012-12-24: 3 mL via INTRAVENOUS

## 2012-12-23 MED ORDER — ONDANSETRON HCL 4 MG/2ML IJ SOLN: 4.0000 mg | Freq: Four times a day (QID) | INTRAMUSCULAR | Status: DC | PRN

## 2012-12-23 MED ORDER — DEXTROSE 5 % IV SOLN
2.0000 g | INTRAVENOUS | Status: AC
  Administered 2012-12-24: 2 g via INTRAVENOUS
  Filled 2012-12-23 (×2): qty 2

## 2012-12-23 MED ORDER — SODIUM CHLORIDE 0.9 % IV SOLN
INTRAVENOUS | Status: DC
  Administered 2012-12-23: 1000 mL via INTRAVENOUS

## 2012-12-23 MED ORDER — IOHEXOL 300 MG/ML  SOLN
50.0000 mL | Freq: Once | INTRAMUSCULAR | Status: AC | PRN
  Administered 2012-12-23: 50 mL via ORAL

## 2012-12-23 MED ORDER — ONDANSETRON HCL 4 MG/2ML IJ SOLN: 4.0000 mg | Freq: Three times a day (TID) | INTRAMUSCULAR | Status: DC | PRN

## 2012-12-23 MED ORDER — DEXTROSE 5 % IV SOLN
2.0000 g | INTRAVENOUS | Status: DC
  Filled 2012-12-23: qty 2

## 2012-12-23 MED ORDER — FOLIC ACID 1 MG PO TABS
2.0000 mg | ORAL_TABLET | Freq: Every morning | ORAL | Status: DC
  Administered 2012-12-23 – 2012-12-25 (×3): 2 mg via ORAL
  Filled 2012-12-23 (×3): qty 2

## 2012-12-23 MED ORDER — POTASSIUM CHLORIDE CRYS ER 20 MEQ PO TBCR
40.0000 meq | EXTENDED_RELEASE_TABLET | Freq: Once | ORAL | Status: AC
  Administered 2012-12-23: 40 meq via ORAL
  Filled 2012-12-23: qty 2

## 2012-12-23 MED ORDER — ACETAMINOPHEN 650 MG RE SUPP: 650.0000 mg | Freq: Four times a day (QID) | RECTAL | Status: DC | PRN

## 2012-12-23 MED ORDER — POTASSIUM CHLORIDE CRYS ER 20 MEQ PO TBCR
40.0000 meq | EXTENDED_RELEASE_TABLET | ORAL | Status: AC
  Administered 2012-12-23 (×2): 40 meq via ORAL
  Filled 2012-12-23 (×2): qty 2

## 2012-12-23 MED ORDER — HYDROMORPHONE HCL PF 1 MG/ML IJ SOLN: 1.0000 mg | INTRAMUSCULAR | Status: AC | PRN

## 2012-12-23 MED ORDER — BRIMONIDINE TARTRATE 0.15 % OP SOLN
1.0000 [drp] | Freq: Two times a day (BID) | OPHTHALMIC | Status: DC
  Administered 2012-12-23 – 2012-12-25 (×5): 1 [drp] via OPHTHALMIC
  Filled 2012-12-23: qty 5

## 2012-12-23 MED ORDER — PEG 3350-KCL-NA BICARB-NACL 420 G PO SOLR
4000.0000 mL | Freq: Once | ORAL | Status: AC
  Administered 2012-12-23: 4000 mL via ORAL

## 2012-12-23 MED ORDER — ONDANSETRON HCL 4 MG PO TABS: 4.0000 mg | ORAL_TABLET | Freq: Four times a day (QID) | ORAL | Status: DC | PRN

## 2012-12-23 MED ORDER — DORZOLAMIDE HCL-TIMOLOL MAL 2-0.5 % OP SOLN
1.0000 [drp] | Freq: Two times a day (BID) | OPHTHALMIC | Status: DC
  Administered 2012-12-23 – 2012-12-25 (×5): 1 [drp] via OPHTHALMIC
  Filled 2012-12-23: qty 10

## 2012-12-23 MED ORDER — HYDROCODONE-ACETAMINOPHEN 5-325 MG PO TABS
2.0000 | ORAL_TABLET | ORAL | Status: DC | PRN
  Administered 2012-12-23: 2 via ORAL
  Administered 2012-12-23: 1 via ORAL
  Administered 2012-12-24 – 2012-12-25 (×5): 2 via ORAL
  Filled 2012-12-23 (×7): qty 2

## 2012-12-23 NOTE — ED Provider Notes (Signed)
I have reviewed the report and personally reviewed the above radiology studies.    [name] [name] S Jhonatan Lomeli, MD 12/23/12 0613 

## 2012-12-23 NOTE — Consult Note (Addendum)
Urology Consult  Requesting provider:  Dr. Sharl Ma  CC: Gross hematuria.  HPI: 67 year old male presents to the ER with urinary retention. He began having gross hematuria on 12/16/12. A Foley catheter was placed. A stab draining and he removed himself but he continued to have urinary retention. He returned to the ER for catheter replacement. Again this was replaced and he was discharged home and he was to followup with me today, but he got admitted back to the hospital for continued clot urinary retention. This catheter was exchanged and he currently has a 16 [place] catheter in place. His urine is grossly clear with some sediment. I was able to irrigate his catheter with return of urinary sediment. The patient he agrees hematuria previously 5 years ago. He does not recall the result of that workup. He has a partial history of smoking for 5 years which was very distant. He was also exposed to secondhand smoke by his father when he was growing out. He denies any dysuria other than urgency when he was unable to void. He has a negative family history of prostate cancer. Nothing makes the gross hematuria better or worse. A CT of the abdomen and pelvis today with IV contrast. This revealed bilateral renal cysts which appeared simple. There were also numerous bilateral small renal lesions which were too small to characterize. There were no enhancing renal lesions concerning for cancer. His bladder was empty but did show some thickening of the bladder wall. He also had enlarged prostate. Rectal exam today is positive for a large prostate but no concerning nodules.  We discussed possible causes of gross hematuria which could include malignant and benign disease. We also discussed the need for workup for prostate cancer. I feel that obtaining a PSA at this time would not be accurate given his catheterization nipple times over the last week. We discussed continued workup with a cystoscopy. We discussed the risk benefits,  and side effects. He wishes to proceed.  PMH: Past Medical History  Diagnosis Date  . Hypopotassemia   . Elevated prostate specific antigen (PSA)   . Nonspecific abnormal results of liver function study   . Unspecified glaucoma(365.9)   . Hypertension   . Disorder of bone and cartilage, unspecified   . Edema   . Blind     PSH: Past Surgical History  Procedure Laterality Date  . Hernia repair  2003  . Eye surgery  2009    Allergies: No Known Allergies  Medications: Prescriptions prior to admission  Medication Sig Dispense Refill  . brimonidine (ALPHAGAN P) 0.1 % SOLN Place 1 drop into both eyes 2 (two) times daily.       . cephALEXin (KEFLEX) 500 MG capsule Take 1 capsule (500 mg total) by mouth 4 (four) times daily.  40 capsule  0  . dorzolamide-timolol (COSOPT) 22.3-6.8 MG/ML ophthalmic solution Place 1 drop into both eyes 2 (two) times daily.       . folic acid (FOLVITE) 1 MG tablet Take 2 mg by mouth every morning.       . hydrochlorothiazide (HYDRODIURIL) 25 MG tablet Take 25 mg by mouth every morning.       . potassium chloride (KLOR-CON) 20 MEQ packet Take 20 mEq by mouth daily.      Marland Kitchen HYDROcodone-acetaminophen (NORCO/VICODIN) 5-325 MG per tablet Take 2 tablets by mouth every 4 (four) hours as needed for pain.  20 tablet  0     Social History: History   Social  History  . Marital Status: Single    Spouse Name: N/A    Number of Children: N/A  . Years of Education: N/A   Occupational History  . Not on file.   Social History Main Topics  . Smoking status: Former Games developer  . Smokeless tobacco: Never Used  . Alcohol Use: No  . Drug Use: No  . Sexually Active: No   Other Topics Concern  . Not on file   Social History Narrative  . No narrative on file    Family History: Family History  Problem Relation Age of Onset  . Kidney failure Mother     Review of Systems: Positive: Chest discomfort. Negative: Nausea, SOB, or fever.  A further 10 point review  of systems was negative except what is listed in the HPI.  Physical Exam: Filed Vitals:   12/23/12 0600  BP: 140/79  Pulse: 67  Temp: 97.7 F (36.5 C)  Resp: 15    General: No acute distress.  Awake. Head:  Normocephalic.  Atraumatic. ENT:  EOMI.  Mucous membranes moist Neck:  Supple.  No lymphadenopathy. CV:  S1 present. S2 present. Regular rate. Pulmonary: Equal effort bilaterally.  Clear to auscultation bilaterally. Abdomen: Soft.  Non- tender to palpation. Skin:  Normal turgor.  No visible rash. Extremity: No gross deformity of bilateral upper extremities.  No gross deformity of    bilateral lower extremities. Neurologic: Alert. Appropriate mood.  Penis:  Circumcised.  No lesions. Urethra: Foley catheter in place draining clear yellow urine w/ sediment.  Orthotopic meatus. Scrotum: No lesions.  No ecchymosis.  No erythema. Testicles descended bilaterally. Bilateral hydroceles.     Studies:  Recent Labs     12/23/12  0235  12/23/12  0240  HGB  8.9*  8.8*  WBC  6.5   --   PLT  224   --     Recent Labs     12/23/12  0240  12/23/12  0419  NA  135  132*  K  2.9*  2.8*  CL  97  94*  CO2   --   26  BUN  17  19  CREATININE  1.40*  1.28  CALCIUM   --   8.9  GFRNONAA   --   57*  GFRAA   --   66*     Recent Labs     12/23/12  0419  INR  1.10  APTT  28     No components found with this basename: ABG,     Assessment:  Gross hematuria.   Plan: -Replace 16Fr cath with 20Fr cath. Nursing hand irrigate until no return of urinary sediment. -Will schedule for outpatient cystoscopy in my clinic later this week.   - Scheduled for f/u this Friday, 12/27/12 at 8 am.  Pager: [phone]    CC: Dr. Sharl Ma

## 2012-12-23 NOTE — H&P (Signed)
[facility] History and Physical  Brent Taillon. QMV:[id number] DOB: January 05, 1946 DOA: 12/23/2012  Referring physician: ED PCP: Oneal Grout, MD    Chief Complaint:  Chief Complaint  Patient presents with  . Blocked urinary catheter      HPI: [name] [name]. is a 67 y.o. male without significant PMHx who started having gross hematuria 1 week ago. He presented to the Ed and was found to have an elevated PSA . He  had a foley inserted and was referred to see urology . Before the urology appointment the patient presented back with severe abdominal pain and it was noted that the foley was blocked by clots and that the patient has developed mild aki and acute blood loss anemia. 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 Sharp , not associated with effort,  Has chronic vision loss from glaucoma  The patient denies anorexia, fever, weight loss,,  decreased hearing, hoarseness, syncope, dyspnea on exertion, balance deficits, hemoptysis, abdominal pain, melena, hematochezia, severe indigestion/heartburn,, genital sores, muscle weakness, suspicious skin lesions, transient blindness, difficulty walking, depression, unusual weight change, abnormal bleeding, enlarged lymph nodes, angioedema,    Past Medical History  Diagnosis Date  . Hypopotassemia   . Elevated prostate specific antigen (PSA)   . Nonspecific abnormal results of liver function study   . Unspecified glaucoma(365.9)   . Hypertension   . Disorder of bone and cartilage, unspecified   . Edema   . Blind    Past Surgical History  Procedure Laterality Date  . Hernia repair  2003  . Eye surgery  2009   Social History:  reports that he has quit smoking. He does not have any smokeless tobacco history on file. He reports that he does not drink alcohol or use illicit  drugs.   No Known Allergies  Family History  Problem Relation Age of Onset  . Kidney failure Mother     Prior to Admission medications   Medication Sig Start Date End Date Taking? Authorizing Provider  brimonidine (ALPHAGAN P) 0.1 % SOLN Place 1 drop into both eyes 2 (two) times daily.    Yes Historical Provider, MD  cephALEXin (KEFLEX) 500 MG capsule Take 1 capsule (500 mg total) by mouth 4 (four) times daily. 12/17/12  Yes Candyce Churn, MD  dorzolamide-timolol (COSOPT) 22.3-6.8 MG/ML ophthalmic solution Place 1 drop into both eyes 2 (two) times daily.    Yes Historical Provider, MD  folic acid (FOLVITE) 1 MG tablet Take 2 mg by mouth every morning.    Yes Historical Provider, MD  hydrochlorothiazide (HYDRODIURIL) 25 MG tablet Take 25 mg by mouth every morning.    Yes Historical Provider, MD  potassium chloride (KLOR-CON) 20 MEQ packet Take 20 mEq by mouth daily.   Yes Historical Provider, MD  HYDROcodone-acetaminophen (NORCO/VICODIN) 5-325 MG per tablet Take 2 tablets by mouth every 4 (four) hours as needed for pain. 12/20/12   Osvaldo Human, MD   Physical Exam: Filed Vitals:   12/23/12 0204 12/23/12 0429  BP: 179/127 155/83  Pulse: 79 68  Temp: 97.8 F (36.6 C)   TempSrc: Oral   Resp: 18 14  SpO2: 100% 100%     General:  axox3  Eyes: wears glasses, perrla  ENT: clear pharynx  Neck: no JVD   Cardiovascular: rrr, no m,r,g   Respiratory: ctab  Abdomen: soft, has suprapubic tenderness   Skin: warm, dry   Musculoskeletal: intact   Psychiatric: euthymic   Neurologic: cn 2-12 intact , strength 5/5 all 4  Labs on Admission:  Basic Metabolic Panel:  Recent Labs Lab 12/23/12 0240 12/23/12 0419  NA 135 132*  K 2.9* 2.8*  CL 97 94*  CO2  --  26  GLUCOSE 112* 108*  BUN 17 19  CREATININE 1.40* 1.28  CALCIUM  --  8.9   Liver Function Tests:  Recent Labs Lab 12/23/12 0419  AST 45*  ALT 35  ALKPHOS 55  BILITOT 1.2  PROT 6.7  ALBUMIN 3.5    No results found for this basename: LIPASE, AMYLASE,  in the last 168 hours No results found for this basename: AMMONIA,  in the last 168 hours CBC:  Recent Labs Lab 12/23/12 0235 12/23/12 0240  WBC 6.5  --   HGB 8.9* 8.8*  HCT 25.9* 26.0*  MCV 91.8  --   PLT 224  --    Cardiac Enzymes: No results found for this basename: CKTOTAL, CKMB, CKMBINDEX, TROPONINI,  in the last 168 hours  BNP (last 3 results) No results found for this basename: PROBNP,  in the last 8760 hours CBG: No results found for this basename: GLUCAP,  in the last 168 hours  Radiological Exams on Admission: No results found.  EKG: ordered   Assessment/Plan Principal Problem:   Hematuria Active Problems:   Elevated prostate specific antigen (PSA)   Hypertension   Edema   Chest pain   Anemia due to blood loss, acute   1. 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 We will get urology consultation for consideration of cysto, continuous foley irrigation , biopsy.  2. Acute blood loss anemia - for now patient is stable hemodynamically. We shall monitor CBCs closely and transfuse as needed  3. Chest pain - patient has never had a w/u - will start with ekg, troponins and echo  4. LE edema - ? Maybe congenital lymphedema - check BNP and echo and TSH  5. dvt px - scds   [name] [name] [facility] Pager [phone]  If 7PM-7AM, please contact night-coverage [email or web address] Password [email or web address] 12/23/2012, 6:02 AM

## 2012-12-23 NOTE — ED Provider Notes (Signed)
History    CSN: [id number] Arrival date & time 12/23/12  0154  First MD Initiated Contact with Patient 12/23/12 0216     Chief Complaint  Patient presents with  . Blocked urinary catheter    (Consider location/radiation/quality/duration/timing/severity/associated sxs/prior Treatment) HPI  [name] [name]. is a 67 y.o. male complaining of clogged foley catheter with urinary retention starting at 2000 yesterday. Pt has had gross hematuria stating on the 14th with urinary retention requiring foley cath placement. Pt is unsure about BPH (h/o elevated PSA). Pt reports suprapubic pain and fullness. Denies fever, N/V, flank pain. Patient is taking Keflex for UTI prophylaxis. Pt has been seen 4 times in the last four days for similar issues. He has not had urology evaluation, has appointment with Dr. Patsi Sears tomorrow morning at 8 AM.  Patient had similar episode of hematuria without urinary retention several years ago.  Past Medical History  Diagnosis Date  . Hypopotassemia   . Elevated prostate specific antigen (PSA)   . Nonspecific abnormal results of liver function study   . Unspecified glaucoma(365.9)   . Hypertension   . Disorder of bone and cartilage, unspecified   . Edema   . Blind    Past Surgical History  Procedure Laterality Date  . Hernia repair  2003  . Eye surgery  2009   No family history on file. History  Substance Use Topics  . Smoking status: Former Games developer  . Smokeless tobacco: Not on file  . Alcohol Use: No    Review of Systems  Constitutional: Negative for fever.  Respiratory: Negative for shortness of breath.   Cardiovascular: Negative for chest pain.  Gastrointestinal: Positive for abdominal pain. Negative for nausea, vomiting and diarrhea.  Genitourinary: Positive for hematuria and difficulty urinating. Negative for flank pain.  All other systems reviewed and are negative.    Allergies  Review of patient's allergies indicates no known  allergies.  Home Medications   Current Outpatient Rx  Name  Route  Sig  Dispense  Refill  . brimonidine (ALPHAGAN P) 0.1 % SOLN   Both Eyes   Place 1 drop into both eyes 2 (two) times daily.          . cephALEXin (KEFLEX) 500 MG capsule   Oral   Take 1 capsule (500 mg total) by mouth 4 (four) times daily.   40 capsule   0   . dorzolamide-timolol (COSOPT) 22.3-6.8 MG/ML ophthalmic solution   Both Eyes   Place 1 drop into both eyes 2 (two) times daily.          . folic acid (FOLVITE) 1 MG tablet   Oral   Take 2 mg by mouth every morning.          . hydrochlorothiazide (HYDRODIURIL) 25 MG tablet   Oral   Take 25 mg by mouth every morning.          Marland Kitchen HYDROcodone-acetaminophen (NORCO/VICODIN) 5-325 MG per tablet   Oral   Take 2 tablets by mouth every 4 (four) hours as needed for pain.   20 tablet   0   . potassium gluconate 595 MG TABS   Oral   Take 595 mg by mouth every morning.          BP 179/127  Pulse 79  Temp(Src) 97.8 F (36.6 C) (Oral)  Resp 18  SpO2 100% Physical Exam  Nursing note and vitals reviewed. Constitutional: He is oriented to person, place, and time. He appears well-developed and well-nourished.  No distress.  HENT:  Head: Normocephalic.  Eyes: Conjunctivae and EOM are normal.  Cardiovascular: Normal rate.   Pulmonary/Chest: Effort normal. No stridor.  Abdominal: Soft. Bowel sounds are normal.  Suprapubic tenderness to palpation.   Musculoskeletal: Normal range of motion.  Neurological: He is alert and oriented to person, place, and time.  Psychiatric: He has a normal mood and affect.    ED Course  Procedures (including critical care time) Labs Reviewed  CBC - Abnormal; Notable for the following:    RBC 2.82 (*)    Hemoglobin 8.9 (*)    HCT 25.9 (*)    All other components within normal limits  POCT I-STAT, CHEM 8 - Abnormal; Notable for the following:    Potassium 2.9 (*)    Creatinine, Ser 1.40 (*)    Glucose, Bld 112 (*)     Calcium, Ion 1.08 (*)    Hemoglobin 8.8 (*)    HCT 26.0 (*)    All other components within normal limits  COMPREHENSIVE METABOLIC PANEL  APTT  PROTIME-INR  TYPE AND SCREEN   No results found. 1. Painless hematuria   2. Urinary retention   3. Anemia   4. AKI (acute kidney injury)     MDM   Filed Vitals:   12/23/12 0204  BP: 179/127  Pulse: 79  Temp: 97.8 F (36.6 C)  TempSrc: Oral  Resp: 18  SpO2: 100%     [name] [name]. is a 67 y.o. male with gross hematuria starting 5 days ago. Patient has urinary retention, Foley placed. He continues to be clogged with blood clots. Flushing the patient's Foley cath resulted in urine. Patient had significant improvement in discomfort.   Patient has acute kidney injury with creatinine of 1.4 this was normal 5 days ago.  H&H is found to be 8 point 8/26. This is a 5 point drop in the last 5 days. Patient is not on any anticoagulation. Patient is asymptomatic he denies chest pain, palpitations, shortness of breath, weakness, fatigue. However chart review shows that he has a history of elevated LFTs. CMP and clotting studies and type and screen pending.  Due to the patient's age, acute drop in hemoglobin and repeated issues with keeping the Foley cath patent needs to be admitted for observation.  Patient [facility] be [facility] Lavera Guise to a telemetry bed.   Wynetta Emery, PA-C 12/23/12 [phone]

## 2012-12-23 NOTE — ED Notes (Signed)
Pt bladder scanned: of urine.

## 2012-12-23 NOTE — ED Notes (Signed)
Pt c/o of return of pain in bladder. Pt catheter flushed again with 20ml of NaCL and blood clot returned as well as more urine.  Urine bag draining now reaching upwards of .

## 2012-12-23 NOTE — Progress Notes (Signed)
  Echocardiogram 2D Echocardiogram has been performed.  Jorje Guild 12/23/2012, 12:40 PM

## 2012-12-23 NOTE — Care Management Note (Addendum)
    Page 1 of 1   12/24/2012     12:44:34 PM   CARE MANAGEMENT NOTE 12/24/2012  Patient:  [name],[name]   Account Number:  [id number]  Date Initiated:  12/23/2012  Documentation initiated by:  Lanier Clam  Subjective/Objective Assessment:   ADMITTED W/HEMATURIA.     Action/Plan:   FROM HOME,ALONE.HAS PCP,PHARMACY.   Anticipated DC Date:  12/25/2012   Anticipated DC Plan:  HOME/SELF CARE      DC Planning Services  CM consult      Choice offered to / List presented to:             Status of service:  In process, will continue to follow Medicare Important Message given?   (If response is "NO", the following Medicare IM given date fields will be blank) Date Medicare IM given:   Date Additional Medicare IM given:    Discharge Disposition:    Per UR Regulation:  Reviewed for med. necessity/level of care/duration of stay  If discussed at Long Length of Stay Meetings, dates discussed:    Comments:  12/24/12 [name] Mccorkel RN,BSN NCM [phone] S/P LAP APPY.  12/23/12 [name] Sanagustin RN,BSN NCM [phone]

## 2012-12-23 NOTE — Consult Note (Signed)
WOC consulted to mark patient preoperatively for possible ileostomy or colostomy.  After I explained that this was just precaution if once the surgeon got in to the abdomen and needed to do more than just remove the appendix we would have sites marked that he can see and that he can manage. He is in agreement to do as a precaution, just in case the surgeon needs them.   Marked ileostomy site at 2cm below the umbilicus and 3cm to the right of the umbilicus Marked colostomy site at 2cm below the umbilicus and 4.5 cm to the left of the umbilicus.  Marked both sites after evaluation of the patient lying, sitting, and standing.  Avoiding the belt line (which the patient reports he wears his pants under the pannus, so I have marked sites that will be above the belt line due to the fact he wears his pants.  I have avoided any creases or abdominal folds.  Pt does have poor vision but he reports he can see both sites I have marked.  Ostomy educational materials left in the room should we need them post op.   Answered questions appropriately.   WOC team will follow up if you reconsult [place], if he ends up with stoma. Tarissa Kerin Gorham RN,CWOCN [phone]

## 2012-12-23 NOTE — ED Notes (Signed)
Per pt report: pt was seen here at [facility] last night for similar complaint.  Pt's catheter was flushed last night and drained more than .  Tonight pt's catheter was clogged up again and pt noted no urine in his bag around 20:00. Pt hx of glaucoma.  Pt a/o x 4.  Skin warm and dry.

## 2012-12-23 NOTE — Progress Notes (Signed)
Subjective: Patient admitted this morning, CT Abdomen shows Focally dilated segment of the appendix measuring up to 2.8 cm  in diameter. There is some thin curvilinear calcification  associated with the wall of the appendix in this region, and the  overall appearance is concerning for potential mucocele of the  appendix.   Filed Vitals:   12/23/12 0600  BP: 140/79  Pulse: 67  Temp: 97.7 F (36.5 C)  Resp: 15    Chest: Clear Bilaterally Heart : S1S2 RRR Abdomen: Soft, nontender Ext : No edema Neuro: Alert, oriented x 3  A/P Hematuria Elevated PSA Anemia ? Mucocele of appendix  Hematuria- CT shows prostatomegaly, elevated PSA. Will get Urology consult. ? Mucocele of appendix- will obtain surgery consult.    Meredeth Ide [facility] [phone]

## 2012-12-23 NOTE — Progress Notes (Signed)
Patient declined MyChart activation at this time

## 2012-12-23 NOTE — Consult Note (Signed)
Reason for Consult: Incidental CT Abdomen showing focally dilated segment of the appendix measuring up to 2.8 cm in diameter, with overall appearance concerning for potential mucocele of the appendix. Referring Physician: Dr. Mauro Kaufmann  [name] Werts. is a 67 y.o. male.  HPI: 67 yo [place] American male presented to the ER on 12/17/2012 with urinary retention. He began having gross hematuria on 12/16/12. ED found elevated PSA.  A Foley catheter was placed. Returned to ER multiple times for catheter obstruction. Each time clots where found. He was readmitted back to the hospital for continued clot urinary retention and associated abdominal pain. Now patient states all discomfort and abdominal pain was relieved after bladder was drained. Patient informed of findings on CT and was then asked about his gastrointestinal history. He admits to abdominal pain only with the recent onset of urinary retention when his bladder was full. Has never had any issues with bowels. Denies fever, chills, nausea, vomiting, dysphagia, hematemesis, diarrhea, constipation, blood in stool, dyspepsia, GERD, distention, post prandial abdominal pain or nausea. Had bilateral inguinal hernia repair in 2003. No other abdominal surgeries. Had eye surgery in 2009 and 2014 for glaucoma related problems.    Past Medical History  Diagnosis Date  . Hypopotassemia   . Elevated prostate specific antigen (PSA)   . Nonspecific abnormal results of liver function study   . Unspecified glaucoma(365.9)   . Hypertension   . Disorder of bone and cartilage, unspecified   . Edema   . Blind     Past Surgical History  Procedure Laterality Date  . Hernia repair Bilateral 2003    Dr. Laroy Apple  . Eye surgery Bilateral 2009    for glaucoma  . Eye surgery Left 2014    Family History  Problem Relation Age of Onset  . Kidney failure Mother     Social History:  reports that he has quit smoking. He has never used smokeless tobacco.  He reports that he does not drink alcohol or use illicit drugs.  Allergies: No Known Allergies  Medications: I have reviewed the patient's current medications.  Results for orders placed during the hospital encounter of 12/23/12 (from the past 48 hour(s))  CBC     Status: Abnormal   Collection Time    12/23/12  2:35 AM      Result Value Range   WBC 6.5  4.0 - 10.5 K/uL   RBC 2.82 (*) 4.22 - 5.81 MIL/uL   Hemoglobin 8.9 (*) 13.0 - 17.0 g/dL   HCT 16.1 (*) 09.6 - 04.5 %   MCV 91.8  78.0 - 100.0 fL   MCH 31.6  26.0 - 34.0 pg   MCHC 34.4  30.0 - 36.0 g/dL   RDW 40.9  81.1 - 91.4 %   Platelets 224  150 - 400 K/uL  POCT I-STAT, CHEM 8     Status: Abnormal   Collection Time    12/23/12  2:40 AM      Result Value Range   Sodium 135  135 - 145 mEq/L   Potassium 2.9 (*) 3.5 - 5.1 mEq/L   Chloride 97  96 - 112 mEq/L   BUN 17  6 - 23 mg/dL   Creatinine, Ser 7.82 (*) 0.50 - 1.35 mg/dL   Glucose, Bld 956 (*) 70 - 99 mg/dL   Calcium, Ion 2.13 (*) 1.13 - 1.30 mmol/L   TCO2 24  0 - 100 mmol/L   Hemoglobin 8.8 (*) 13.0 - 17.0 g/dL  HCT 26.0 (*) 39.0 - 52.0 %  TYPE AND SCREEN     Status: None   Collection Time    12/23/12  3:52 AM      Result Value Range   ABO/RH(D) B POS     Antibody Screen NEG     Sample Expiration 12/26/2012    ABO/RH     Status: None   Collection Time    12/23/12  3:52 AM      Result Value Range   ABO/RH(D) B POS    COMPREHENSIVE METABOLIC PANEL     Status: Abnormal   Collection Time    12/23/12  4:19 AM      Result Value Range   Sodium 132 (*) 135 - 145 mEq/L   Potassium 2.8 (*) 3.5 - 5.1 mEq/L   Chloride 94 (*) 96 - 112 mEq/L   CO2 26  19 - 32 mEq/L   Glucose, Bld 108 (*) 70 - 99 mg/dL   BUN 19  6 - 23 mg/dL   Creatinine, Ser 4.09  0.50 - 1.35 mg/dL   Calcium 8.9  8.4 - 81.1 mg/dL   Total Protein 6.7  6.0 - 8.3 g/dL   Albumin 3.5  3.5 - 5.2 g/dL   AST 45 (*) 0 - 37 U/L   ALT 35  0 - 53 U/L   Alkaline Phosphatase 55  39 - 117 U/L   Total Bilirubin  1.2  0.3 - 1.2 mg/dL   GFR calc non Af Amer 57 (*) >90 mL/min   GFR calc Af Amer 66 (*) >90 mL/min   Comment:            The eGFR has been calculated     using the CKD EPI equation.     This calculation has not been     validated in all clinical     situations.     eGFR's persistently     <90 mL/min signify     possible Chronic Kidney Disease.  APTT     Status: None   Collection Time    12/23/12  4:19 AM      Result Value Range   aPTT 28  24 - 37 seconds  PROTIME-INR     Status: None   Collection Time    12/23/12  4:19 AM      Result Value Range   Prothrombin Time 14.0  11.6 - 15.2 seconds   INR 1.10  0.00 - 1.49  TSH     Status: None   Collection Time    12/23/12  4:19 AM      Result Value Range   TSH 1.218  0.350 - 4.500 uIU/mL  PRO B NATRIURETIC PEPTIDE     Status: Abnormal   Collection Time    12/23/12  6:40 AM      Result Value Range   Pro B Natriuretic peptide (BNP) 345.6 (*) 0 - 125 pg/mL  TROPONIN I     Status: None   Collection Time    12/23/12  6:40 AM      Result Value Range   Troponin I <0.30  <0.30 ng/mL   Comment:            Due to the release kinetics of cTnI,     a negative result within the first hours     of the onset of symptoms does not rule out     myocardial infarction with certainty.     If myocardial infarction  is still suspected,     repeat the test at appropriate intervals.    Ct Abdomen Pelvis W Contrast  12/23/2012   *RADIOLOGY REPORT*  Clinical Data: Bladder pain.  Gross hematuria.  CT ABDOMEN AND PELVIS WITH CONTRAST  Technique:  Multidetector CT imaging of the abdomen and pelvis was performed following the standard protocol during bolus administration of intravenous contrast.  Contrast: OMNIPAQUE IOHEXOL 300 MG/ML  SOLN  Comparison: No priors.  Findings:  Lung Bases: Unremarkable.  Abdomen/Pelvis:  There are multiple renal lesions bilaterally. Many of these are subcentimeter in size and too small to definitively characterize.  The  larger lesions are all well defined low attenuation and do not enhance, compatible with simple cysts, with the largest cyst extending exophytically from the anterior aspect of the lower pole of the right kidney measuring up to 5 cm in diameter.  Post contrast delayed images demonstrate no definite filling defect within the collecting system of either kidney.  The Foley balloon catheter is in position within the urinary bladder. Urinary bladder is nearly completely decompressed, but appears thick-walled.  The prostate gland is massively enlarged, with severe median lobe hypertrophy.  The gland is irregular in shape, making an accurate measurement challenging, however, the gland appears to measure approximately 11.2 x 8.4 x 11.4 cm.  Subcentimeter low attenuation lesion in segment 8 of the liver is too small to definitively characterize, but statistically likely to represent a small cyst.  No other larger more suspicious appearing hepatic lesions are noted.  The appearance of the gallbladder, pancreas, spleen and bilateral adrenal glands is unremarkable. Mild atherosclerosis throughout the abdominal and pelvic vasculature, without evidence of aneurysm or dissection.  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  No significant volume of ascites.  No pneumoperitoneum.  No pathologic distension of small bowel.  No definite pathologic lymphadenopathy identified within the abdomen or pelvis on today's examination.  Small left inguinal hernia containing only fat.  Musculoskeletal: There are no aggressive appearing lytic or blastic lesions noted in the visualized portions of the skeleton.  IMPRESSION: 1.  Massive prostatomegaly.  2.  Multiple renal lesions bilaterally.  The majority of these represent multiple simple cysts, however, many of the smaller  lesions are too small to definitively characterize. 3.  Focally dilated segment of the appendix measuring up to 2.8 cm in diameter.  There is some thin curvilinear calcification associated with the wall of the appendix in this region, and the overall appearance is concerning for potential mucocele of the appendix.  Nonemergent surgical consultation is recommended. 4.  Atherosclerosis.   Original Report Authenticated By: Trudie Reed, M.D.    Review of Systems  Constitutional: Negative for fever, chills, weight loss, malaise/fatigue and diaphoresis.  HENT: Positive for nosebleeds. Negative for congestion, sore throat and neck pain.   Eyes: Negative.   Respiratory: Negative for cough, hemoptysis, shortness of breath, wheezing and stridor.   Cardiovascular: Positive for leg swelling. Negative for chest pain, palpitations and orthopnea.  Gastrointestinal: Negative for heartburn, nausea, vomiting, abdominal pain, diarrhea, constipation, blood in stool and melena.  Genitourinary: Negative for dysuria, urgency, frequency, hematuria and flank pain.  Musculoskeletal: Negative for myalgias, back pain, joint pain and falls.  Skin: Negative for itching and rash.  Neurological: Negative for dizziness, tingling, tremors, sensory change, speech change, focal weakness, seizures, loss  of consciousness, weakness and headaches.  Endo/Heme/Allergies: Negative for environmental allergies and polydipsia. Does not bruise/bleed easily.  Psychiatric/Behavioral: Negative for depression, suicidal ideas, hallucinations, memory loss and substance abuse. The patient is not nervous/anxious and does not have insomnia.    Blood pressure 140/79, pulse 67, temperature 97.7 F (36.5 C), temperature source Oral, resp. rate 15, height 5\' 8"  (1.727 m), weight 203 lb 11.3 oz (92.4 kg), SpO2 100.00%. Physical Exam  Constitutional: He is oriented to person, place, and time. He appears well-developed and well-nourished. No distress.   HENT:  Head: Normocephalic and atraumatic.  Nose: Nose normal.  Mouth/Throat: Oropharynx is clear and moist.  Eyes: EOM are normal. No scleral icterus.  Left eye cloudy at 12 o'clock position  Neck: No JVD present. No tracheal deviation present. No thyromegaly present.  Cardiovascular: Normal rate, regular rhythm, normal heart sounds and intact distal pulses.  Exam reveals no gallop and no friction rub.   No murmur heard. Respiratory: Effort normal. No stridor. No respiratory distress. He has no wheezes. He has no rales. He exhibits no tenderness.  GI: Soft. Bowel sounds are normal. He exhibits no distension, no ascites and no mass. There is no hepatosplenomegaly. There is no tenderness. There is no rigidity, no rebound, no guarding, no tenderness at McBurney's point and negative Murphy's sign. Hernia confirmed negative in the ventral area, confirmed negative in the right inguinal area and confirmed negative in the left inguinal area.  Negative Rovsings sign, Murphys sign, Psoas sign  Musculoskeletal: He exhibits edema. He exhibits no tenderness.  Bilateral 3+ pitting edema  Neurological: He is alert and oriented to person, place, and time. Coordination normal.  Skin: Skin is warm and dry. No rash noted.  hyerpigmentation and scaling of skin on tibial aspect bilaterally.  Psychiatric: He has a normal mood and affect. His behavior is normal. Judgment and thought content normal.    Assessment/Plan: 1. 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 Hematuria 3. Acute Kidney injury 4. Urinary retention 5. Prostatomegaly  Plan: 1. Prepare for OR in am - laparoscopic appendectomy, possible ileocecectomy, possible open 2. Surgery preparation: clear liquid diet until 12am, 12am start NPO. Begin Golytly immediatley. 3.  Consent form 4.  Pre-op abx 5.  Hold VTE prophylaxis  Georgina Quint Physician Assistant Student, [facility] Surgery [phone]  12/23/2012, 12:20  PM  The patient was seen by me and examined.  Procedure explained to him.  Plan to proceed tomorrow with lap appendectomy

## 2012-12-24 ENCOUNTER — Encounter (HOSPITAL_COMMUNITY): Payer: Self-pay | Admitting: Anesthesiology

## 2012-12-24 ENCOUNTER — Observation Stay (HOSPITAL_COMMUNITY): Payer: Medicare Other | Admitting: Anesthesiology

## 2012-12-24 ENCOUNTER — Encounter: Payer: Self-pay | Admitting: Internal Medicine

## 2012-12-24 ENCOUNTER — Encounter (HOSPITAL_COMMUNITY)
Admission: EM | Disposition: A | Discharge status: Home / Self Care | Payer: Self-pay | Source: Home / Self Care | Attending: Family Medicine

## 2012-12-24 DIAGNOSIS — R319 Hematuria, unspecified: Secondary | ICD-10-CM | POA: Diagnosis not present

## 2012-12-24 DIAGNOSIS — I10 Essential (primary) hypertension: Secondary | ICD-10-CM | POA: Diagnosis not present

## 2012-12-24 DIAGNOSIS — K389 Disease of appendix, unspecified: Secondary | ICD-10-CM | POA: Diagnosis not present

## 2012-12-24 DIAGNOSIS — R339 Retention of urine, unspecified: Secondary | ICD-10-CM

## 2012-12-24 DIAGNOSIS — N179 Acute kidney failure, unspecified: Secondary | ICD-10-CM | POA: Diagnosis not present

## 2012-12-24 DIAGNOSIS — E876 Hypokalemia: Secondary | ICD-10-CM

## 2012-12-24 DIAGNOSIS — H409 Unspecified glaucoma: Secondary | ICD-10-CM | POA: Diagnosis not present

## 2012-12-24 DIAGNOSIS — E871 Hypo-osmolality and hyponatremia: Secondary | ICD-10-CM | POA: Diagnosis not present

## 2012-12-24 DIAGNOSIS — D126 Benign neoplasm of colon, unspecified: Secondary | ICD-10-CM | POA: Diagnosis not present

## 2012-12-24 DIAGNOSIS — D62 Acute posthemorrhagic anemia: Secondary | ICD-10-CM | POA: Diagnosis not present

## 2012-12-24 HISTORY — PX: LAPAROSCOPIC APPENDECTOMY: SHX408

## 2012-12-24 LAB — COMPREHENSIVE METABOLIC PANEL
ALT: 28 U/L (ref 0–53)
AST: 35 U/L (ref 0–37)
Albumin: 2.9 g/dL — ABNORMAL LOW (ref 3.5–5.2)
Alkaline Phosphatase: 48 U/L (ref 39–117)
BUN: 9 mg/dL (ref 6–23)
CO2: 27 mEq/L (ref 19–32)
Calcium: 8.3 mg/dL — ABNORMAL LOW (ref 8.4–10.5)
Chloride: 106 mEq/L (ref 96–112)
Creatinine, Ser: 0.89 mg/dL (ref 0.50–1.35)
GFR calc Af Amer: >90 mL/min (ref >90)
GFR calc non Af Amer: 87 mL/min — ABNORMAL LOW (ref >90)
Glucose, Bld: 92 mg/dL (ref 70–99)
Potassium: 3.6 mEq/L (ref 3.5–5.1)
Sodium: 140 mEq/L (ref 135–145)
Total Bilirubin: 0.8 mg/dL (ref 0.3–1.2)
Total Protein: 5.8 g/dL — ABNORMAL LOW (ref 6.0–8.3)

## 2012-12-24 LAB — CBC
HCT: 25.4 % — ABNORMAL LOW (ref 39.0–52.0)
Hemoglobin: 8.5 g/dL — ABNORMAL LOW (ref 13.0–17.0)
MCH: 31.6 pg (ref 26.0–34.0)
MCHC: 33.5 g/dL (ref 30.0–36.0)
MCV: 94.4 fL (ref 78.0–100.0)
Platelets: 199 10*3/uL (ref 150–400)
RBC: 2.69 MIL/uL — ABNORMAL LOW (ref 4.22–5.81)
RDW: 13.3 % (ref 11.5–15.5)
WBC: 5.7 10*3/uL (ref 4.0–10.5)

## 2012-12-24 LAB — SURGICAL PCR SCREEN
MRSA, PCR: NEGATIVE
Staphylococcus aureus: NEGATIVE

## 2012-12-24 SURGERY — APPENDECTOMY, LAPAROSCOPIC
Anesthesia: General | Site: Abdomen | Wound class: Clean Contaminated

## 2012-12-24 MED ORDER — MIDAZOLAM HCL 5 MG/5ML IJ SOLN
INTRAMUSCULAR | Status: DC | PRN
  Administered 2012-12-24: 2 mg via INTRAVENOUS

## 2012-12-24 MED ORDER — BUPIVACAINE-EPINEPHRINE 0.25% -1:200000 IJ SOLN
INTRAMUSCULAR | Status: AC
  Filled 2012-12-24: qty 1

## 2012-12-24 MED ORDER — PROPOFOL 10 MG/ML IV BOLUS
INTRAVENOUS | Status: DC | PRN
  Administered 2012-12-24: 180 mg via INTRAVENOUS

## 2012-12-24 MED ORDER — GLYCOPYRROLATE 0.2 MG/ML IJ SOLN
INTRAMUSCULAR | Status: DC | PRN
  Administered 2012-12-24: 0.6 mg via INTRAVENOUS

## 2012-12-24 MED ORDER — HEPARIN SODIUM (PORCINE) 5000 UNIT/ML IJ SOLN
5000.0000 [IU] | Freq: Three times a day (TID) | INTRAMUSCULAR | Status: DC
  Administered 2012-12-24 – 2012-12-25 (×3): 5000 [IU] via SUBCUTANEOUS
  Filled 2012-12-24 (×6): qty 1

## 2012-12-24 MED ORDER — PROMETHAZINE HCL 25 MG/ML IJ SOLN: 6.2500 mg | INTRAMUSCULAR | Status: DC | PRN

## 2012-12-24 MED ORDER — NEOSTIGMINE METHYLSULFATE 1 MG/ML IJ SOLN
INTRAMUSCULAR | Status: DC | PRN
  Administered 2012-12-24: 4 mg via INTRAVENOUS

## 2012-12-24 MED ORDER — PHENYLEPHRINE HCL 10 MG/ML IJ SOLN
INTRAMUSCULAR | Status: DC | PRN
  Administered 2012-12-24: 80 ug via INTRAVENOUS

## 2012-12-24 MED ORDER — LIDOCAINE HCL (CARDIAC) 20 MG/ML IV SOLN
INTRAVENOUS | Status: DC | PRN
  Administered 2012-12-24: 100 mg via INTRAVENOUS

## 2012-12-24 MED ORDER — ONDANSETRON HCL 4 MG/2ML IJ SOLN
INTRAMUSCULAR | Status: DC | PRN
  Administered 2012-12-24: 4 mg via INTRAVENOUS

## 2012-12-24 MED ORDER — LACTATED RINGERS IR SOLN
Status: DC | PRN
  Administered 2012-12-24: 1000 mL

## 2012-12-24 MED ORDER — HYDROCHLOROTHIAZIDE 12.5 MG PO CAPS
12.5000 mg | ORAL_CAPSULE | Freq: Every day | ORAL | Status: DC
  Administered 2012-12-24 – 2012-12-25 (×2): 12.5 mg via ORAL
  Filled 2012-12-24 (×2): qty 1

## 2012-12-24 MED ORDER — 0.9 % SODIUM CHLORIDE (POUR BTL) OPTIME
TOPICAL | Status: DC | PRN
  Administered 2012-12-24: 1000 mL

## 2012-12-24 MED ORDER — FENTANYL CITRATE 0.05 MG/ML IJ SOLN
INTRAMUSCULAR | Status: DC | PRN
  Administered 2012-12-24 (×4): 50 ug via INTRAVENOUS

## 2012-12-24 MED ORDER — POTASSIUM CHLORIDE CRYS ER 20 MEQ PO TBCR
20.0000 meq | EXTENDED_RELEASE_TABLET | Freq: Every day | ORAL | Status: DC
  Administered 2012-12-25: 20 meq via ORAL
  Filled 2012-12-24 (×2): qty 1

## 2012-12-24 MED ORDER — KCL IN DEXTROSE-NACL 20-5-0.45 MEQ/L-%-% IV SOLN
INTRAVENOUS | Status: DC
  Administered 2012-12-25: 05:00:00 via INTRAVENOUS
  Filled 2012-12-24 (×3): qty 1000

## 2012-12-24 MED ORDER — CIPROFLOXACIN IN D5W 400 MG/200ML IV SOLN
400.0000 mg | Freq: Two times a day (BID) | INTRAVENOUS | Status: DC
Start: 2012-12-24 — End: 2012-12-25
  Administered 2012-12-24 – 2012-12-25 (×2): 400 mg via INTRAVENOUS
  Filled 2012-12-24 (×3): qty 200

## 2012-12-24 MED ORDER — HYDROMORPHONE HCL PF 1 MG/ML IJ SOLN: 0.2500 mg | INTRAMUSCULAR | Status: DC | PRN

## 2012-12-24 MED ORDER — LACTATED RINGERS IV SOLN
INTRAVENOUS | Status: DC | PRN
  Administered 2012-12-24 (×2): via INTRAVENOUS

## 2012-12-24 MED ORDER — DEXAMETHASONE SODIUM PHOSPHATE 10 MG/ML IJ SOLN
INTRAMUSCULAR | Status: DC | PRN
  Administered 2012-12-24: 10 mg via INTRAVENOUS

## 2012-12-24 MED ORDER — SUCCINYLCHOLINE CHLORIDE 20 MG/ML IJ SOLN
INTRAMUSCULAR | Status: DC | PRN
  Administered 2012-12-24: 100 mg via INTRAVENOUS

## 2012-12-24 MED ORDER — BUPIVACAINE-EPINEPHRINE 0.25% -1:200000 IJ SOLN
INTRAMUSCULAR | Status: DC | PRN
  Administered 2012-12-24: 5 mL

## 2012-12-24 MED ORDER — ROCURONIUM BROMIDE 100 MG/10ML IV SOLN
INTRAVENOUS | Status: DC | PRN
  Administered 2012-12-24: 30 mg via INTRAVENOUS

## 2012-12-24 SURGICAL SUPPLY — 37 items
APPLIER CLIP ROT 10 11.4 M/L (STAPLE)
CANISTER SUCTION 2500CC (MISCELLANEOUS) ×2 IMPLANT
CLIP APPLIE ROT 10 11.4 M/L (STAPLE) IMPLANT
CLOTH BEACON ORANGE TIMEOUT ST (SAFETY) ×2 IMPLANT
COVER SURGICAL LIGHT HANDLE (MISCELLANEOUS) ×2 IMPLANT
CUTTER FLEX LINEAR 45M (STAPLE) ×2 IMPLANT
DECANTER SPIKE VIAL GLASS SM (MISCELLANEOUS) ×2 IMPLANT
DERMABOND ADVANCED (GAUZE/BANDAGES/DRESSINGS) ×1
DERMABOND ADVANCED .7 DNX12 (GAUZE/BANDAGES/DRESSINGS) ×1 IMPLANT
DRAPE LAPAROSCOPIC ABDOMINAL (DRAPES) ×2 IMPLANT
ELECT REM PT RETURN 9FT ADLT (ELECTROSURGICAL) ×2
ELECTRODE REM PT RTRN 9FT ADLT (ELECTROSURGICAL) ×1 IMPLANT
ENDOLOOP SUT PDS II  0 18 (SUTURE)
ENDOLOOP SUT PDS II 0 18 (SUTURE) IMPLANT
GLOVE BIOGEL M 8.0 STRL (GLOVE) ×2 IMPLANT
GLOVE BIOGEL PI IND STRL 7.0 (GLOVE) ×1 IMPLANT
GLOVE BIOGEL PI INDICATOR 7.0 (GLOVE) ×1
GOWN STRL NON-REIN LRG LVL3 (GOWN DISPOSABLE) ×2 IMPLANT
GOWN STRL REIN XL XLG (GOWN DISPOSABLE) ×4 IMPLANT
HAND ACTIVATED (MISCELLANEOUS) ×2 IMPLANT
KIT BASIN OR (CUSTOM PROCEDURE TRAY) ×2 IMPLANT
NS IRRIG 1000ML POUR BTL (IV SOLUTION) ×2 IMPLANT
PENCIL BUTTON HOLSTER BLD 10FT (ELECTRODE) IMPLANT
POUCH SPECIMEN RETRIEVAL 10MM (ENDOMECHANICALS) ×2 IMPLANT
RELOAD 45 VASCULAR/THIN (ENDOMECHANICALS) ×4 IMPLANT
RELOAD STAPLE TA45 3.5 REG BLU (ENDOMECHANICALS) IMPLANT
SET IRRIG TUBING LAPAROSCOPIC (IRRIGATION / IRRIGATOR) ×2 IMPLANT
SOLUTION ANTI FOG 6CC (MISCELLANEOUS) ×2 IMPLANT
STRIP CLOSURE SKIN 1/2X4 (GAUZE/BANDAGES/DRESSINGS) ×2 IMPLANT
SUT VIC AB 4-0 SH 18 (SUTURE) ×2 IMPLANT
SUT VICRYL 0 UR6 27IN ABS (SUTURE) ×2 IMPLANT
SYR 30ML LL (SYRINGE) ×2 IMPLANT
TRAY FOLEY CATH 14FRSI W/METER (CATHETERS) ×2 IMPLANT
TRAY LAP CHOLE (CUSTOM PROCEDURE TRAY) ×2 IMPLANT
TROCAR XCEL BLUNT TIP 100MML (ENDOMECHANICALS) ×2 IMPLANT
TROCAR XCEL NON-BLD 11X100MML (ENDOMECHANICALS) ×2 IMPLANT
TUBING INSUFFLATION 10FT LAP (TUBING) ×2 IMPLANT

## 2012-12-24 NOTE — Anesthesia Preprocedure Evaluation (Signed)
Anesthesia Evaluation  Patient identified by MRN, date of birth, ID band Patient awake  General Assessment Comment:.  Hypopotassemia     .  Elevated prostate specific antigen (PSA)     .  Nonspecific abnormal results of liver function study     .  Unspecified glaucoma(365.9)     .  Hypertension     .  Disorder of bone and cartilage, unspecified     .  Edema     .  Blind     Reviewed: Allergy & Precautions, H&P , NPO status , Patient's Chart, lab work & pertinent test results  Airway Mallampati: II TM Distance: >3 FB Neck ROM: Full    Dental no notable dental hx.    Pulmonary neg pulmonary ROS,  breath sounds clear to auscultation  Pulmonary exam normal       Cardiovascular Exercise Tolerance: Good hypertension, Pt. on medications negative cardio ROS  Rhythm:Regular Rate:Normal     Neuro/Psych negative neurological ROS  negative psych ROS   GI/Hepatic negative GI ROS, Neg liver ROS,   Endo/Other  negative endocrine ROS  Renal/GU negative Renal ROS  negative genitourinary   Musculoskeletal negative musculoskeletal ROS (+)   Abdominal   Peds negative pediatric ROS (+)  Hematology negative hematology ROS (+)   Anesthesia Other Findings   Reproductive/Obstetrics negative OB ROS                           Anesthesia Physical Anesthesia Plan  ASA: II  Anesthesia Plan: General   Post-op Pain Management:    Induction: Intravenous  Airway Management Planned: Oral ETT  Additional Equipment:   Intra-op Plan:   Post-operative Plan: Extubation in OR  Informed Consent: I have reviewed the patients History and Physical, chart, labs and discussed the procedure including the risks, benefits and alternatives for the proposed anesthesia with the patient or authorized representative who has indicated his/her understanding and acceptance.   Dental advisory given  Plan Discussed with:  CRNA  Anesthesia Plan Comments:         Anesthesia Quick Evaluation

## 2012-12-24 NOTE — Interval H&P Note (Signed)
History and Physical Interval Note:  12/24/2012 8:11 AM  [name] [name].  has presented today for surgery, with the diagnosis of mucocele of appendix  The various methods of treatment have been discussed with the patient and family. After consideration of risks, benefits and other options for treatment, the patient has consented to  Procedure(s):  LAPAROSCOPIC APPENDECTOMY WITH POSSIBLE ILEOCECECTOMY, POSSIBLE OPEN (N/A) as a surgical intervention .  The patient's history has been reviewed, patient examined, no change in status, stable for surgery.  I have reviewed the patient's chart and labs.  Questions were answered to the patient's satisfaction.     [name] B

## 2012-12-24 NOTE — Progress Notes (Signed)
[facility] PROGRESS NOTE  [name] [name]. WUJ:[id number] DOB: [birth year] DOA: 12/23/2012 PCP: Oneal Grout, MD  Assessment/Plan:  *Hematuria- patient has significant hematuria, CT abdomen shows multiple kidney stones and prostatomegaly.Patient has foley catheter in place, will follow up urology as outpatient for cystoscopy.  S/P laparoscopic appendectomy-CT Abdomen shows Focally dilated segment of the appendix measuring up to 2.8 cm in diameter. There is some thin curvilinear calcification associated with the wall of the appendix in this region, and the overall appearance is concerning for potential mucocele of the appendix. Surgery was consulted and he underwent appendectomy.  UTI- Patient had abnormal UA on 12/17/12 in the ED, no culture obtained, but patient says he has been taking antibiotics. will start Cipro as flouroquinolones have better penetration of the prostate.  Hyponatremia- resolved  Hypokalemia- resolved, potassium replaced.  Hypertension- will restart HCTZ as patient says he gets leg swelling without HCTZ, will also add potassium pills.Will start the Hctz at lower dose of 12.5 mg po daily to avoid the side effects of hypokalemia.  Anemia- Secondary to hematuria, Hb has remained around 8.5, will need to follow up CBC as outpatient  DVT Prophylaxis- SCD  Code Status: *Full code Family Communication: *Discussed with patient in detail Disposition Plan: *Home when stable   Consultants:  Urology  Surgery  Procedures:  Laparoscopic appendectomy  Antibiotics:  Cipro  HPI/Subjective: Patient seen and examined, admitted with hematuria and now s/p laparoscopic appendectomy after CT abdomen revealed incidental finding of mucocele in the appendix.  Objective: Filed Vitals:   12/24/12 1015 12/24/12 1020 12/24/12 1030 12/24/12 1045  BP: 136/78  139/84 139/79  Pulse: 60 63 60 58  Temp:  97.9 F (36.6 C)  98.3 F (36.8 C)  TempSrc:      Resp: 14 16 16  16   Height:      Weight:      SpO2: 100% 100% 100% 100%    Intake/Output Summary (Last 24 hours) at 12/24/12 1400 Last data filed at 12/24/12 1030  Gross per 24 hour  Intake 2972.5 ml  Output   2410 ml  Net  562.5 ml   Filed Weights   12/23/12 0600  Weight: 92.4 kg (203 lb 11.3 oz)    Exam:   General:  Appear in no acute distress  Cardiovascular: S1s2 RRR  Respiratory: Clear bilaterally  Abdomen: *Soft, mild tenderness in RLQ  Musculoskeletal: *No edema  Data Reviewed: Basic Metabolic Panel:  Recent Labs Lab 12/23/12 0240 12/23/12 0419 12/24/12 0511  NA 135 132* 140  K 2.9* 2.8* 3.6  CL 97 94* 106  CO2  --  26 27  GLUCOSE 112* 108* 92  BUN 17 19 9   CREATININE 1.40* 1.28 0.89  CALCIUM  --  8.9 8.3*   Liver Function Tests:  Recent Labs Lab 12/23/12 0419 12/24/12 0511  AST 45* 35  ALT 35 28  ALKPHOS 55 48  BILITOT 1.2 0.8  PROT 6.7 5.8*  ALBUMIN 3.5 2.9*   No results found for this basename: LIPASE, AMYLASE,  in the last 168 hours No results found for this basename: AMMONIA,  in the last 168 hours CBC:  Recent Labs Lab 12/23/12 0235 12/23/12 0240 12/24/12 0511  WBC 6.5  --  5.7  HGB 8.9* 8.8* 8.5*  HCT 25.9* 26.0* 25.4*  MCV 91.8  --  94.4  PLT 224  --  199   Cardiac Enzymes:  Recent Labs Lab 12/23/12 0640  TROPONINI <0.30   BNP (last 3 results)  Recent Labs  12/23/12 0640  PROBNP 345.6*   CBG: No results found for this basename: GLUCAP,  in the last 168 hours  Recent Results (from the past 240 hour(s))  SURGICAL PCR SCREEN     Status: None   Collection Time    12/24/12  5:42 AM      Result Value Range Status   MRSA, PCR NEGATIVE  NEGATIVE Final   Staphylococcus aureus NEGATIVE  NEGATIVE Final   Comment:            The Xpert SA Assay (FDA     approved for NASAL specimens     in patients over 28 years of age),     is one component of     a comprehensive surveillance     program.  Test performance has     been  validated by [employer] for patients greater     than or equal to 23 year old.     It is not intended     to diagnose infection nor to     guide or monitor treatment.     Studies: Ct Abdomen Pelvis W Contrast  12/23/2012   *RADIOLOGY REPORT*  Clinical Data: Bladder pain.  Gross hematuria.  CT ABDOMEN AND PELVIS WITH CONTRAST  Technique:  Multidetector CT imaging of the abdomen and pelvis was performed following the standard protocol during bolus administration of intravenous contrast.  Contrast: OMNIPAQUE IOHEXOL 300 MG/ML  SOLN  Comparison: No priors.  Findings:  Lung Bases: Unremarkable.  Abdomen/Pelvis:  There are multiple renal lesions bilaterally. Many of these are subcentimeter in size and too small to definitively characterize.  The larger lesions are all well defined low attenuation and do not enhance, compatible with simple cysts, with the largest cyst extending exophytically from the anterior aspect of the lower pole of the right kidney measuring up to 5 cm in diameter.  Post contrast delayed images demonstrate no definite filling defect within the collecting system of either kidney.  The Foley balloon catheter is in position within the urinary bladder. Urinary bladder is nearly completely decompressed, but appears thick-walled.  The prostate gland is massively enlarged, with severe median lobe hypertrophy.  The gland is irregular in shape, making an accurate measurement challenging, however, the gland appears to measure approximately 11.2 x 8.4 x 11.4 cm.  Subcentimeter low attenuation lesion in segment 8 of the liver is too small to definitively characterize, but statistically likely to represent a small cyst.  No other larger more suspicious appearing hepatic lesions are noted.  The appearance of the gallbladder, pancreas, spleen and bilateral adrenal glands is unremarkable. Mild atherosclerosis throughout the abdominal and pelvic vasculature, without evidence of aneurysm or  dissection.  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  No significant volume of ascites.  No pneumoperitoneum.  No pathologic distension of small bowel.  No definite pathologic lymphadenopathy identified within the abdomen or pelvis on today's examination.  Small left inguinal hernia containing only fat.  Musculoskeletal: There are no aggressive appearing lytic or blastic lesions noted in the visualized portions of the skeleton.  IMPRESSION: 1.  Massive prostatomegaly.  2.  Multiple renal lesions bilaterally.  The majority of these represent multiple simple cysts, however, many of the smaller lesions are too small to definitively characterize. 3.  Focally dilated segment of the appendix measuring up to 2.8 cm in diameter.  There is some thin curvilinear calcification associated with the wall of the appendix in this region, and the overall appearance is concerning for potential mucocele of the appendix.  Nonemergent surgical consultation is recommended. 4.  Atherosclerosis.   Original Report Authenticated By: Trudie Reed, M.D.    Scheduled Meds: . brimonidine  1 drop Both Eyes BID  . dorzolamide-timolol  1 drop Both Eyes BID  . folic acid  2 mg Oral q morning - 10a  . heparin  5,000 Units Subcutaneous Q8H  . potassium chloride  40 mEq Oral Once  . sodium chloride  3 mL Intravenous Q12H   Continuous Infusions: . dextrose 5 % and 0.45 % NaCl with KCl 20 mEq/L 75 mL/hr (12/24/12 1200)  . sodium chloride 0.9 % 1,000 mL with potassium chloride 20 mEq infusion 75 mL/hr at 12/24/12 0700    Principal Problem:   Hematuria Active Problems:   Elevated prostate specific antigen (PSA)   Hypertension   Edema   Chest pain   Anemia due to blood loss, acute    Time spent: 25 min    [facility]  [facility] Pager  [phone]. If 7PM-7AM, please contact night-coverage at [email or web address], password [facility] 12/24/2012, 2:00 PM  LOS: 1 day

## 2012-12-24 NOTE — H&P (View-Only) (Signed)
Reason for Consult: Incidental CT Abdomen showing focally dilated segment of the appendix measuring up to 2.8 cm in diameter, with overall appearance concerning for potential mucocele of the appendix. Referring Physician: Dr. Gagan Lama  [name] [name]. is a 67 y.o. male.  HPI: 67 yo African American male presented to the ER on 12/17/2012 with urinary retention. He began having gross hematuria on 12/16/12. ED found elevated PSA.  A Foley catheter was placed. Returned to ER multiple times for catheter obstruction. Each time clots where found. He was readmitted back to the hospital for continued clot urinary retention and associated abdominal pain. Now patient states all discomfort and abdominal pain was relieved after bladder was drained. Patient informed of findings on CT and was then asked about his gastrointestinal history. He admits to abdominal pain only with the recent onset of urinary retention when his bladder was full. Has never had any issues with bowels. Denies fever, chills, nausea, vomiting, dysphagia, hematemesis, diarrhea, constipation, blood in stool, dyspepsia, GERD, distention, post prandial abdominal pain or nausea. Had bilateral inguinal hernia repair in 2003. No other abdominal surgeries. Had eye surgery in 2009 and 2014 for glaucoma related problems.    Past Medical History  Diagnosis Date  . Hypopotassemia   . Elevated prostate specific antigen (PSA)   . Nonspecific abnormal results of liver function study   . Unspecified glaucoma(365.9)   . Hypertension   . Disorder of bone and cartilage, unspecified   . Edema   . Blind     Past Surgical History  Procedure Laterality Date  . Hernia repair Bilateral 2003    Dr. Chuck Collins  . Eye surgery Bilateral 2009    for glaucoma  . Eye surgery Left 2014    Family History  Problem Relation Age of Onset  . Kidney failure Mother     Social History:  reports that he has quit smoking. He has never used smokeless tobacco.  He reports that he does not drink alcohol or use illicit drugs.  Allergies: No Known Allergies  Medications: I have reviewed the patient's current medications.  Results for orders placed during the hospital encounter of 12/23/12 (from the past 48 hour(s))  CBC     Status: Abnormal   Collection Time    12/23/12  2:35 AM      Result Value Range   WBC 6.5  4.0 - 10.5 K/uL   RBC 2.82 (*) 4.22 - 5.81 MIL/uL   Hemoglobin 8.9 (*) 13.0 - 17.0 g/dL   HCT 25.9 (*) 39.0 - 52.0 %   MCV 91.8  78.0 - 100.0 fL   MCH 31.6  26.0 - 34.0 pg   MCHC 34.4  30.0 - 36.0 g/dL   RDW 12.8  11.5 - 15.5 %   Platelets 224  150 - 400 K/uL  POCT I-STAT, CHEM 8     Status: Abnormal   Collection Time    12/23/12  2:40 AM      Result Value Range   Sodium 135  135 - 145 mEq/L   Potassium 2.9 (*) 3.5 - 5.1 mEq/L   Chloride 97  96 - 112 mEq/L   BUN 17  6 - 23 mg/dL   Creatinine, Ser 1.40 (*) 0.50 - 1.35 mg/dL   Glucose, Bld 112 (*) 70 - 99 mg/dL   Calcium, Ion 1.08 (*) 1.13 - 1.30 mmol/L   TCO2 24  0 - 100 mmol/L   Hemoglobin 8.8 (*) 13.0 - 17.0 g/dL     HCT 26.0 (*) 39.0 - 52.0 %  TYPE AND SCREEN     Status: None   Collection Time    12/23/12  3:52 AM      Result Value Range   ABO/RH(D) B POS     Antibody Screen NEG     Sample Expiration 12/26/2012    ABO/RH     Status: None   Collection Time    12/23/12  3:52 AM      Result Value Range   ABO/RH(D) B POS    COMPREHENSIVE METABOLIC PANEL     Status: Abnormal   Collection Time    12/23/12  4:19 AM      Result Value Range   Sodium 132 (*) 135 - 145 mEq/L   Potassium 2.8 (*) 3.5 - 5.1 mEq/L   Chloride 94 (*) 96 - 112 mEq/L   CO2 26  19 - 32 mEq/L   Glucose, Bld 108 (*) 70 - 99 mg/dL   BUN 19  6 - 23 mg/dL   Creatinine, Ser 1.28  0.50 - 1.35 mg/dL   Calcium 8.9  8.4 - 10.5 mg/dL   Total Protein 6.7  6.0 - 8.3 g/dL   Albumin 3.5  3.5 - 5.2 g/dL   AST 45 (*) 0 - 37 U/L   ALT 35  0 - 53 U/L   Alkaline Phosphatase 55  39 - 117 U/L   Total Bilirubin  1.2  0.3 - 1.2 mg/dL   GFR calc non Af Amer 57 (*) >90 mL/min   GFR calc Af Amer 66 (*) >90 mL/min   Comment:            The eGFR has been calculated     using the CKD EPI equation.     This calculation has not been     validated in all clinical     situations.     eGFR's persistently     <90 mL/min signify     possible Chronic Kidney Disease.  APTT     Status: None   Collection Time    12/23/12  4:19 AM      Result Value Range   aPTT 28  24 - 37 seconds  PROTIME-INR     Status: None   Collection Time    12/23/12  4:19 AM      Result Value Range   Prothrombin Time 14.0  11.6 - 15.2 seconds   INR 1.10  0.00 - 1.49  TSH     Status: None   Collection Time    12/23/12  4:19 AM      Result Value Range   TSH 1.218  0.350 - 4.500 uIU/mL  PRO B NATRIURETIC PEPTIDE     Status: Abnormal   Collection Time    12/23/12  6:40 AM      Result Value Range   Pro B Natriuretic peptide (BNP) 345.6 (*) 0 - 125 pg/mL  TROPONIN I     Status: None   Collection Time    12/23/12  6:40 AM      Result Value Range   Troponin I <0.30  <0.30 ng/mL   Comment:            Due to the release kinetics of cTnI,     a negative result within the first hours     of the onset of symptoms does not rule out     myocardial infarction with certainty.     If myocardial infarction   is still suspected,     repeat the test at appropriate intervals.    Ct Abdomen Pelvis W Contrast  12/23/2012   *RADIOLOGY REPORT*  Clinical Data: Bladder pain.  Gross hematuria.  CT ABDOMEN AND PELVIS WITH CONTRAST  Technique:  Multidetector CT imaging of the abdomen and pelvis was performed following the standard protocol during bolus administration of intravenous contrast.  Contrast: 100mL OMNIPAQUE IOHEXOL 300 MG/ML  SOLN  Comparison: No priors.  Findings:  Lung Bases: Unremarkable.  Abdomen/Pelvis:  There are multiple renal lesions bilaterally. Many of these are subcentimeter in size and too small to definitively characterize.  The  larger lesions are all well defined low attenuation and do not enhance, compatible with simple cysts, with the largest cyst extending exophytically from the anterior aspect of the lower pole of the right kidney measuring up to 5 cm in diameter.  Post contrast delayed images demonstrate no definite filling defect within the collecting system of either kidney.  The Foley balloon catheter is in position within the urinary bladder. Urinary bladder is nearly completely decompressed, but appears thick-walled.  The prostate gland is massively enlarged, with severe median lobe hypertrophy.  The gland is irregular in shape, making an accurate measurement challenging, however, the gland appears to measure approximately 11.2 x 8.4 x 11.4 cm.  Subcentimeter low attenuation lesion in segment 8 of the liver is too small to definitively characterize, but statistically likely to represent a small cyst.  No other larger more suspicious appearing hepatic lesions are noted.  The appearance of the gallbladder, pancreas, spleen and bilateral adrenal glands is unremarkable. Mild atherosclerosis throughout the abdominal and pelvic vasculature, without evidence of aneurysm or dissection.  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  No significant volume of ascites.  No pneumoperitoneum.  No pathologic distension of small bowel.  No definite pathologic lymphadenopathy identified within the abdomen or pelvis on today's examination.  Small left inguinal hernia containing only fat.  Musculoskeletal: There are no aggressive appearing lytic or blastic lesions noted in the visualized portions of the skeleton.  IMPRESSION: 1.  Massive prostatomegaly.  2.  Multiple renal lesions bilaterally.  The majority of these represent multiple simple cysts, however, many of the smaller  lesions are too small to definitively characterize. 3.  Focally dilated segment of the appendix measuring up to 2.8 cm in diameter.  There is some thin curvilinear calcification associated with the wall of the appendix in this region, and the overall appearance is concerning for potential mucocele of the appendix.  Nonemergent surgical consultation is recommended. 4.  Atherosclerosis.   Original Report Authenticated By: Daniel Entrikin, M.D.    Review of Systems  Constitutional: Negative for fever, chills, weight loss, malaise/fatigue and diaphoresis.  HENT: Positive for nosebleeds. Negative for congestion, sore throat and neck pain.   Eyes: Negative.   Respiratory: Negative for cough, hemoptysis, shortness of breath, wheezing and stridor.   Cardiovascular: Positive for leg swelling. Negative for chest pain, palpitations and orthopnea.  Gastrointestinal: Negative for heartburn, nausea, vomiting, abdominal pain, diarrhea, constipation, blood in stool and melena.  Genitourinary: Negative for dysuria, urgency, frequency, hematuria and flank pain.  Musculoskeletal: Negative for myalgias, back pain, joint pain and falls.  Skin: Negative for itching and rash.  Neurological: Negative for dizziness, tingling, tremors, sensory change, speech change, focal weakness, seizures, loss   of consciousness, weakness and headaches.  Endo/Heme/Allergies: Negative for environmental allergies and polydipsia. Does not bruise/bleed easily.  Psychiatric/Behavioral: Negative for depression, suicidal ideas, hallucinations, memory loss and substance abuse. The patient is not nervous/anxious and does not have insomnia.    Blood pressure 140/79, pulse 67, temperature 97.7 F (36.5 C), temperature source Oral, resp. rate 15, height 5' 8" (1.727 m), weight 203 lb 11.3 oz (92.4 kg), SpO2 100.00%. Physical Exam  Constitutional: He is oriented to person, place, and time. He appears well-developed and well-nourished. No distress.   HENT:  Head: Normocephalic and atraumatic.  Nose: Nose normal.  Mouth/Throat: Oropharynx is clear and moist.  Eyes: EOM are normal. No scleral icterus.  Left eye cloudy at 12 o'clock position  Neck: No JVD present. No tracheal deviation present. No thyromegaly present.  Cardiovascular: Normal rate, regular rhythm, normal heart sounds and intact distal pulses.  Exam reveals no gallop and no friction rub.   No murmur heard. Respiratory: Effort normal. No stridor. No respiratory distress. He has no wheezes. He has no rales. He exhibits no tenderness.  GI: Soft. Bowel sounds are normal. He exhibits no distension, no ascites and no mass. There is no hepatosplenomegaly. There is no tenderness. There is no rigidity, no rebound, no guarding, no tenderness at McBurney's point and negative Murphy's sign. Hernia confirmed negative in the ventral area, confirmed negative in the right inguinal area and confirmed negative in the left inguinal area.  Negative Rovsings sign, Murphys sign, Psoas sign  Musculoskeletal: He exhibits edema. He exhibits no tenderness.  Bilateral 3+ pitting edema  Neurological: He is alert and oriented to person, place, and time. Coordination normal.  Skin: Skin is warm and dry. No rash noted.  hyerpigmentation and scaling of skin on tibial aspect bilaterally.  Psychiatric: He has a normal mood and affect. His behavior is normal. Judgment and thought content normal.    Assessment/Plan: 1. 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 Hematuria 3. Acute Kidney injury 4. Urinary retention 5. Prostatomegaly  Plan: 1. Prepare for OR in am - laparoscopic appendectomy, possible ileocecectomy, possible open 2. Surgery preparation: clear liquid diet until 12am, 12am start NPO. Begin Golytly immediatley. 3.  Consent form 4.  Pre-op abx 5.  Hold VTE prophylaxis  Roper, [place] Physician Assistant Student, [employer] [facility] [phone]  12/23/2012, 12:20  PM  The patient was seen by me and examined.  Procedure explained to him.  Plan to proceed tomorrow with lap appendectomy    

## 2012-12-24 NOTE — Anesthesia Postprocedure Evaluation (Signed)
  Anesthesia Post-op Note  Patient: [name] [name].  Procedure(s) Performed: Procedure(s) (LRB):  LAPAROSCOPIC APPENDECTOMY  (N/A)  Patient Location: PACU  Anesthesia Type: General  Level of Consciousness: awake and alert   Airway and Oxygen Therapy: Patient Spontanous Breathing  Post-op Pain: mild  Post-op Assessment: Post-op Vital signs reviewed, Patient's Cardiovascular Status Stable, Respiratory Function Stable, Patent Airway and No signs of Nausea or vomiting  Last Vitals:  Filed Vitals:   12/24/12 1045  BP: 139/79  Pulse: 58  Temp: 36.8 C  Resp: 16    Post-op Vital Signs: stable   Complications: No apparent anesthesia complications

## 2012-12-24 NOTE — Op Note (Signed)
Surgeon: Wenda Low, MD, FACS  Asst:  none  Anes:  general  Procedure: Laparoscopic appendectomy for mucocele of the appendix  Diagnosis: Mucocele and prostate cancer  Complications: None noted  EBL:   minimal cc  Description of Procedure:  Taken to room 6 on the LDOW.  General anesthesia and prep with PCMX.  Timeout and entrance to abdomen through umbilicus with Roseanne Reno.  5 mm in right upper quadrant and 11 in left lower quadrant.  Liver has some cirrhotic changes.  Large prostate noted.  Cecum identified and base of appendix noted and it appear that mucocele is at distal tip.  Good margin obtained by taking some cecum with the appendix.  Harmonic used to transect the mesentery.  Removed without rupturing and brought out in a bag.  Umbilicus closed with fig of 8 and simple 0 vicryl.  Inspection showed intact stump and no bleeding.  Deflation and closure with dermabond and steri strips on the navel.  To PACU in stable condition.   Matt B. Daphine Deutscher, MD, [facility], [place] [phone]

## 2012-12-24 NOTE — Transfer of Care (Signed)
Immediate Anesthesia Transfer of Care Note  Patient: [name] [name].  Procedure(s) Performed: Procedure(s):  LAPAROSCOPIC APPENDECTOMY  (N/A)  Patient Location: PACU  Anesthesia Type:General  Level of Consciousness: sedated  Airway & Oxygen Therapy: Patient Spontanous Breathing and Patient connected to face mask oxygen  Post-op Assessment: Report given to PACU RN and Post -op Vital signs reviewed and stable  Post vital signs: Reviewed and stable  Complications: No apparent anesthesia complications

## 2012-12-25 ENCOUNTER — Encounter (HOSPITAL_COMMUNITY): Payer: Self-pay | Admitting: Surgery

## 2012-12-25 DIAGNOSIS — N179 Acute kidney failure, unspecified: Secondary | ICD-10-CM | POA: Diagnosis not present

## 2012-12-25 DIAGNOSIS — R319 Hematuria, unspecified: Secondary | ICD-10-CM | POA: Diagnosis not present

## 2012-12-25 LAB — BASIC METABOLIC PANEL
BUN: 8 mg/dL (ref 6–23)
CO2: 25 mEq/L (ref 19–32)
Calcium: 8.6 mg/dL (ref 8.4–10.5)
Chloride: 104 mEq/L (ref 96–112)
Creatinine, Ser: 0.84 mg/dL (ref 0.50–1.35)
GFR calc Af Amer: >90 mL/min (ref >90)
GFR calc non Af Amer: 89 mL/min — ABNORMAL LOW (ref >90)
Glucose, Bld: 141 mg/dL — ABNORMAL HIGH (ref 70–99)
Potassium: 4.2 mEq/L (ref 3.5–5.1)
Sodium: 137 mEq/L (ref 135–145)

## 2012-12-25 LAB — CBC
HCT: 27.7 % — ABNORMAL LOW (ref 39.0–52.0)
Hemoglobin: 9.4 g/dL — ABNORMAL LOW (ref 13.0–17.0)
MCH: 32.2 pg (ref 26.0–34.0)
MCHC: 33.9 g/dL (ref 30.0–36.0)
MCV: 94.9 fL (ref 78.0–100.0)
Platelets: 231 10*3/uL (ref 150–400)
RBC: 2.92 MIL/uL — ABNORMAL LOW (ref 4.22–5.81)
RDW: 13.4 % (ref 11.5–15.5)
WBC: 12.5 10*3/uL — ABNORMAL HIGH (ref 4.0–10.5)

## 2012-12-25 MED ORDER — HYDROCODONE-ACETAMINOPHEN 5-325 MG PO TABS: 1.0000 | ORAL_TABLET | ORAL | Status: DC | PRN

## 2012-12-25 MED ORDER — CIPROFLOXACIN HCL 500 MG PO TABS: 500.0000 mg | ORAL_TABLET | Freq: Two times a day (BID) | ORAL | Status: DC

## 2012-12-25 NOTE — Progress Notes (Signed)
Urology Progress Note  Subjective:     No acute urologic events overnight. Appendectomy yesterday. No further hematuria at this time.  ROS: Negative: Chest pain or SOB.  Objective:  Patient Vitals for the past 24 hrs:  BP Temp Temp src Pulse Resp SpO2  12/25/12 0634 139/72 mmHg 98.1 F (36.7 C) Oral 69 16 100 %  12/25/12 0217 122/69 mmHg 98.1 F (36.7 C) Oral 47 14 100 %  12/24/12 2111 134/77 mmHg 97.4 F (36.3 C) Oral 55 20 100 %  12/24/12 1700 116/75 mmHg 97.8 F (36.6 C) Oral 53 16 100 %  12/24/12 1300 149/75 mmHg 99.1 F (37.3 C) Oral 70 20 100 %  12/24/12 1045 139/79 mmHg 98.3 F (36.8 C) - 58 16 100 %  12/24/12 1030 139/84 mmHg - - 60 16 100 %  12/24/12 1020 - 97.9 F (36.6 C) - 63 16 100 %  12/24/12 1015 136/78 mmHg - - 60 14 100 %  12/24/12 1005 - - - 65 14 99 %  12/24/12 1000 156/89 mmHg - - 62 13 100 %  12/24/12 0955 141/90 mmHg 97.6 F (36.4 C) - 65 15 100 %    Physical Exam: General:  No acute distress, awake Cardiovascular:    [x]   S1/S2 present, RRR  []   Irregularly irregular Chest:  CTA-B Abdomen:               [x]  Soft, appropriately TTP  []  Soft, NTTP  []  Soft, appropriately TTP, incision(s) clean/dry/intact  Genitourinary: Foley in place Foley:  Draining clear yellow urine.    I/O last 3 completed shifts: In: 5965.3 [P.O.:1254; I.V.:4251.3; Other:60; IV Piggyback:400] Out: 3710 [Urine:3710]  Recent Labs     12/24/12  0511  12/25/12  0500  HGB  8.5*  9.4*  WBC  5.7  12.5*  PLT  199  231    Recent Labs     12/24/12  0511  12/25/12  0500  NA  140  137  K  3.6  4.2  CL  106  104  CO2  27  25  BUN  9  8  CREATININE  0.89  0.84  CALCIUM  8.3*  8.6  GFRNONAA  87*  89*  GFRAA  >90  >90     Recent Labs     12/23/12  0419  INR  1.10  APTT  28     No components found with this basename: ABG,     Length of stay: 2 days.  Assessment: BPH. Gross hematuria.    Plan: Discharge home w/ catheter. F/u this Friday at 8 am  for cystoscopy.   Natalia Leatherwood, MD [phone]

## 2012-12-25 NOTE — Discharge Summary (Signed)
Physician Discharge Summary  [name] [name]. ZOX:[id number] DOB: Aug 23, 1945 DOA: 12/23/2012  PCP: Oneal Grout, MD  Admit date: 12/23/2012 Discharge date: 12/25/2012  Recommendations for Outpatient Follow-up:  1. Pt will need to follow up with PCP in 2-3 weeks post discharge 2. Please obtain BMP to evaluate electrolytes and kidney function 3. Please also check CBC to evaluate Hg and Hct levels 4. Pt discharged home with foley cath as recommended by urologist, has an appointment scheduled with urologist this coming Friday, pt made aware  5. Pt discharge on Ciprofloxacin to complete therapy for presumptive UTI  Discharge Diagnoses: Hematuria  Principal Problem:   Hematuria Active Problems:   Elevated prostate specific antigen (PSA)   Hypertension   Edema   Chest pain   Anemia due to blood loss, acute  Discharge Condition: Stable  Diet recommendation: Heart healthy diet discussed in details   History of present illness:  67 y.o. male without significant PMHx who started having gross hematuria 1 week prior to this admission. He presented to the ED and was found to have an elevated PSA . He had a foley inserted and was referred to see urology . Before the urology appointment the patient presented back with severe abdominal pain and it was noted that the foley was blocked by clots and that the patient has developed mild AKI and acute blood loss anemia. He was admitted for further w/u and observation by [facility].  Hospital Course:  *Hematuria- patient has significant hematuria, CT abdomen showed multiple kidney stones and prostatomegaly.Patient has foley catheter in place, will follow up with urology as outpatient for cystoscopy. Pt made aware.  S/P laparoscopic appendectomy-CT Abdomen showed focally dilated segment of the appendix measuring up to 2.8 cm in diameter. There is some thin curvilinear calcification associated with the wall of the appendix in this region, and the overall  appearance is concerning for potential mucocele of the appendix. Surgery was consulted and he underwent appendectomy. Clinically doing well, hemodynamically stable.  UTI- Patient had abnormal UA on 12/17/12 in the ED, no culture obtained, but patient says he has been taking antibiotics. Cipro was started  as flouroquinolones have better penetration of the prostate.  Hyponatremia- resolved  Hypokalemia- resolved, potassium replaced.  Hypertension- continue HCTZ as patient says he gets leg swelling without HCTZ. Anemia- Secondary to hematuria, Hb has remained stable overall DVT Prophylaxis- SCD while inpatient   Code Status: *Full code  Family Communication: *Discussed with patient in detail   Consultants:  Urology  Surgery Procedures:  Laparoscopic appendectomy Ct Abdomen Pelvis W Contrast 12/23/2012   1.  Massive prostatomegaly.   2.  Multiple renal lesions bilaterally.  The majority of these represent multiple simple cysts, however, many of the smaller lesions are too small to definitively characterize.  3.  Focally dilated segment of the appendix measuring up to 2.8 cm in diameter.  There is some thin curvilinear calcification associated with the wall of the appendix in this region, and the overall appearance is concerning for potential mucocele of the appendix.  Nonemergent surgical consultation is recommended.  4.  Atherosclerosis.    Antibiotics:  Ciprofloxacin   Discharge Exam: Filed Vitals:   12/25/12 0634  BP: 139/72  Pulse: 69  Temp: 98.1 F (36.7 C)  Resp: 16   Filed Vitals:   12/24/12 1700 12/24/12 2111 12/25/12 0217 12/25/12 0634  BP: 116/75 134/77 122/69 139/72  Pulse: 53 55 47 69  Temp: 97.8 F (36.6 C) 97.4 F (36.3 C) 98.1 F (36.7  C) 98.1 F (36.7 C)  TempSrc: Oral Oral Oral Oral  Resp: 16 20 14 16   Height:      Weight:      SpO2: 100% 100% 100% 100%    General: Pt is alert, follows commands appropriately, not in acute distress Cardiovascular: Regular  rate and rhythm, S1/S2 +, no murmurs, no rubs, no gallops Respiratory: Clear to auscultation bilaterally, no wheezing, no crackles, no rhonchi Abdominal: Soft, non tender, non distended, bowel sounds +, no guarding Extremities: no edema, no cyanosis, pulses palpable bilaterally DP and PT Neuro: Grossly nonfocal  Discharge Instructions  Discharge Orders   Future Appointments Provider Department Dept Phone   01/14/2013 12:00 PM [facility], [place] [phone]   Future Orders Complete By Expires     Diet - low sodium heart healthy  As directed     Increase activity slowly  As directed         Medication List    STOP taking these medications       cephALEXin 500 MG capsule  Commonly known as:  KEFLEX      TAKE these medications       brimonidine 0.1 % Soln  Commonly known as:  ALPHAGAN P  Place 1 drop into both eyes 2 (two) times daily.     ciprofloxacin 500 MG tablet  Commonly known as:  CIPRO  Take 1 tablet (500 mg total) by mouth 2 (two) times daily.     dorzolamide-timolol 22.3-6.8 MG/ML ophthalmic solution  Commonly known as:  COSOPT  Place 1 drop into both eyes 2 (two) times daily.     folic acid 1 MG tablet  Commonly known as:  FOLVITE  Take 2 mg by mouth every morning.     hydrochlorothiazide 25 MG tablet  Commonly known as:  HYDRODIURIL  Take 25 mg by mouth every morning.     HYDROcodone-acetaminophen 5-325 MG per tablet  Commonly known as:  NORCO/VICODIN  Take 1-2 tablets by mouth every 4 (four) hours as needed for pain.     potassium chloride 20 MEQ packet  Commonly known as:  KLOR-CON  Take 20 mEq by mouth daily.           Follow-up Information   Follow up with Milford Cage, MD On 12/27/2012. (8:00 am)    Contact information:   [address] [place] FLO[address] [place] [address] [phone]       Follow up with [facility] On 01/14/2013. (Arrive at 11:30 to  fill out new patient forms)    Contact information:   [address]   [place] [address] [phone]       Follow up with Debbora Presto, MD. (As needed if symptoms worsen)    Contact information:   [address] [place] [address] [phone]        The results of significant diagnostics from this hospitalization (including imaging, microbiology, ancillary and laboratory) are listed below for reference.     Microbiology: Recent Results (from the past 240 hour(s))  SURGICAL PCR SCREEN     Status: None   Collection Time    12/24/12  5:42 AM      Result Value Range Status   MRSA, PCR NEGATIVE  NEGATIVE Final   Staphylococcus aureus NEGATIVE  NEGATIVE Final   Comment:            The  Xpert SA Assay (FDA     approved for NASAL specimens     in patients over 80 years of age),     is one component of     a comprehensive surveillance     program.  Test performance has     been validated by [employer] for patients greater     than or equal to 75 year old.     It is not intended     to diagnose infection nor to     guide or monitor treatment.     Labs: Basic Metabolic Panel:  Recent Labs Lab 12/23/12 0240 12/23/12 0419 12/24/12 0511 12/25/12 0500  NA 135 132* 140 137  K 2.9* 2.8* 3.6 4.2  CL 97 94* 106 104  CO2  --  26 27 25   GLUCOSE 112* 108* 92 141*  BUN 17 19 9 8   CREATININE 1.40* 1.28 0.89 0.84  CALCIUM  --  8.9 8.3* 8.6   Liver Function Tests:  Recent Labs Lab 12/23/12 0419 12/24/12 0511  AST 45* 35  ALT 35 28  ALKPHOS 55 48  BILITOT 1.2 0.8  PROT 6.7 5.8*  ALBUMIN 3.5 2.9*   CBC:  Recent Labs Lab 12/23/12 0235 12/23/12 0240 12/24/12 0511 12/25/12 0500  WBC 6.5  --  5.7 12.5*  HGB 8.9* 8.8* 8.5* 9.4*  HCT 25.9* 26.0* 25.4* 27.7*  MCV 91.8  --  94.4 94.9  PLT 224  --  199 231   Cardiac Enzymes:  Recent Labs Lab 12/23/12 0640  TROPONINI <0.30   BNP: BNP (last 3 results)  Recent Labs   12/23/12 0640  PROBNP 345.6*   SIGNED: Time coordinating discharge: Over 30 minutes  Debbora Presto, MD  [facility] 12/25/2012, 12:25 PM Pager [phone]  If 7PM-7AM, please contact night-coverage [email or web address] Password [email or web address]

## 2012-12-25 NOTE — Progress Notes (Signed)
Discharge instructions explained with teach back, prescriptions given. Patient verbalizes understanding of same. Stable for discharge.

## 2012-12-25 NOTE — Progress Notes (Signed)
Patient ID: [name] [name]., male   DOB: 02-25-1946, 67 y.o.   MRN: [id number] 1 Day Post-Op  Subjective: No complaints, min pain, tolerating diet, ready to go home  Objective: Vital signs in last 24 hours: Temp:  [97.4 F (36.3 C)-99.1 F (37.3 C)] 98.1 F (36.7 C) (07/23 0634) Pulse Rate:  [47-70] 69 (07/23 0634) Resp:  [13-20] 16 (07/23 0634) BP: (116-156)/(69-90) 139/72 mmHg (07/23 0634) SpO2:  [99 %-100 %] 100 % (07/23 0634) Last BM Date: 12/24/12  Intake/Output from previous day: 07/22 0701 - 07/23 0700 In: 5499 [P.O.:1254; I.V.:3845; IV Piggyback:400] Out: 2350 [Urine:2350] Intake/Output this shift: Total I/O In: 1432.5 [I.V.:1432.5] Out: -   PE: Abd: soft nontender, +BS, incisions c/d/i General: NAD  Lab Results:   Recent Labs  12/24/12 0511 12/25/12 0500  WBC 5.7 12.5*  HGB 8.5* 9.4*  HCT 25.4* 27.7*  PLT 199 231   BMET  Recent Labs  12/24/12 0511 12/25/12 0500  NA 140 137  K 3.6 4.2  CL 106 104  CO2 27 25  GLUCOSE 92 141*  BUN 9 8  CREATININE 0.89 0.84  CALCIUM 8.3* 8.6   PT/INR  Recent Labs  12/23/12 0419  LABPROT 14.0  INR 1.10   CMP     Component Value Date/Time   NA 137 12/25/2012 0500   NA 137 09/17/2012 1727   K 4.2 12/25/2012 0500   CL 104 12/25/2012 0500   CO2 25 12/25/2012 0500   GLUCOSE 141* 12/25/2012 0500   GLUCOSE 94 09/17/2012 1727   BUN 8 12/25/2012 0500   BUN 13 09/17/2012 1727   CREATININE 0.84 12/25/2012 0500   CALCIUM 8.6 12/25/2012 0500   PROT 5.8* 12/24/2012 0511   PROT 7.1 09/17/2012 1727   ALBUMIN 2.9* 12/24/2012 0511   AST 35 12/24/2012 0511   ALT 28 12/24/2012 0511   ALKPHOS 48 12/24/2012 0511   BILITOT 0.8 12/24/2012 0511   GFRNONAA 89* 12/25/2012 0500   GFRAA >90 12/25/2012 0500   Lipase  No results found for this basename: lipase       Studies/Results: Ct Abdomen Pelvis W Contrast  12/23/2012   *RADIOLOGY REPORT*  Clinical Data: Bladder pain.  Gross hematuria.  CT ABDOMEN AND PELVIS WITH CONTRAST   Technique:  Multidetector CT imaging of the abdomen and pelvis was performed following the standard protocol during bolus administration of intravenous contrast.  Contrast: OMNIPAQUE IOHEXOL 300 MG/ML  SOLN  Comparison: No priors.  Findings:  Lung Bases: Unremarkable.  Abdomen/Pelvis:  There are multiple renal lesions bilaterally. Many of these are subcentimeter in size and too small to definitively characterize.  The larger lesions are all well defined low attenuation and do not enhance, compatible with simple cysts, with the largest cyst extending exophytically from the anterior aspect of the lower pole of the right kidney measuring up to 5 cm in diameter.  Post contrast delayed images demonstrate no definite filling defect within the collecting system of either kidney.  The Foley balloon catheter is in position within the urinary bladder. Urinary bladder is nearly completely decompressed, but appears thick-walled.  The prostate gland is massively enlarged, with severe median lobe hypertrophy.  The gland is irregular in shape, making an accurate measurement challenging, however, the gland appears to measure approximately 11.2 x 8.4 x 11.4 cm.  Subcentimeter low attenuation lesion in segment 8 of the liver is too small to definitively characterize, but statistically likely to represent a small cyst.  No other larger more suspicious  appearing hepatic lesions are noted.  The appearance of the gallbladder, pancreas, spleen and bilateral adrenal glands is unremarkable. Mild atherosclerosis throughout the abdominal and pelvic vasculature, without evidence of aneurysm or dissection.  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  No significant volume of ascites.  No pneumoperitoneum.  No pathologic distension of small bowel.  No definite  pathologic lymphadenopathy identified within the abdomen or pelvis on today's examination.  Small left inguinal hernia containing only fat.  Musculoskeletal: There are no aggressive appearing lytic or blastic lesions noted in the visualized portions of the skeleton.  IMPRESSION: 1.  Massive prostatomegaly.  2.  Multiple renal lesions bilaterally.  The majority of these represent multiple simple cysts, however, many of the smaller lesions are too small to definitively characterize. 3.  Focally dilated segment of the appendix measuring up to 2.8 cm in diameter.  There is some thin curvilinear calcification associated with the wall of the appendix in this region, and the overall appearance is concerning for potential mucocele of the appendix.  Nonemergent surgical consultation is recommended. 4.  Atherosclerosis.   Original Report Authenticated By: Trudie Reed, M.D.    Anti-infectives: Anti-infectives   Start     Dose/Rate Route Frequency Ordered Stop   12/24/12 1600  ciprofloxacin (CIPRO) IVPB 400 mg     400 mg 200 mL/hr over 60 Minutes Intravenous Every 12 hours 12/24/12 1413     12/24/12 0600  cefoTEtan (CEFOTAN) 2 g in dextrose 5 % 50 mL IVPB  Status:  Discontinued     2 g 100 mL/hr over 30 Minutes Intravenous On call to O.R. 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

## 2012-12-25 NOTE — Discharge Instructions (Signed)
[facility] ______[facility] °LAPAROSCOPIC SURGERY: POST OP INSTRUCTIONS °Always review your discharge instruction sheet given to you by the facility where your surgery was performed. °IF YOU HAVE DISABILITY OR FAMILY LEAVE FORMS, YOU MUST BRING THEM TO THE OFFICE FOR PROCESSING.   °DO NOT GIVE THEM TO YOUR DOCTOR. ° °1. A prescription for pain medication may be given to you upon discharge.  Take your pain medication as prescribed, if needed.  If narcotic pain medicine is not needed, then you may take acetaminophen (Tylenol) or ibuprofen (Advil) as needed. °2. Take your usually prescribed medications unless otherwise directed. °3. If you need a refill on your pain medication, please contact your pharmacy.  They will contact our office to request authorization. Prescriptions will not be filled after 5pm or on week-ends. °4. You should follow a light diet the first few days after arrival home, such as soup and crackers, etc.  Be sure to include lots of fluids daily. °5. Most patients will experience some swelling and bruising in the area of the incisions.  Ice packs will help.  Swelling and bruising can take several days to resolve.  °6. It is common to experience some constipation if taking pain medication after surgery.  Increasing fluid intake and taking a stool softener (such as Colace) will usually help or prevent this problem from occurring.  A mild laxative (Milk of Magnesia or Miralax) should be taken according to package instructions if there are no bowel movements after 48 hours. °7. Unless discharge instructions indicate otherwise, you may remove your bandages 24-48 hours after surgery, and you may shower at that time.  You may have steri-strips (small skin tapes) in place directly over the incision.  These strips should be left on the skin for 7-10 days.  If your surgeon used skin glue on the incision, you may shower in 24 hours.  The glue will flake off over the next 2-3 weeks.  Any sutures or  staples will be removed at the office during your follow-up visit. °8. ACTIVITIES:  You may resume regular (light) daily activities beginning the next day--such as daily self-care, walking, climbing stairs--gradually increasing activities as tolerated.  You may have sexual intercourse when it is comfortable.  Refrain from any heavy lifting or straining until approved by your doctor. °a. You may drive when you are no longer taking prescription pain medication, you can comfortably wear a seatbelt, and you can safely maneuver your car and apply brakes. °b. RETURN TO WORK:  __________________________________________________________ °9. You should see your doctor in the office for a follow-up appointment approximately 2-3 weeks after your surgery.  Make sure that you call for this appointment within a day or two after you arrive home to insure a convenient appointment time. °10. OTHER INSTRUCTIONS: __________________________________________________________________________________________________________________________ __________________________________________________________________________________________________________________________ °WHEN TO CALL YOUR DOCTOR: °1. Fever over 101.0 °2. Inability to urinate °3. Continued bleeding from incision. °4. Increased pain, redness, or drainage from the incision. °5. Increasing abdominal pain ° °The clinic staff is available to answer your questions during regular business hours.  Please don’t hesitate to call and ask to speak to one of the nurses for clinical concerns.  If you have a medical emergency, go to the nearest emergency room or call 911.  A surgeon from [facility] is always on call at the hospital. °[address], [address], [place], [place]  [address] ? [address] [address], [place], [place]   [address] °[phone] ? [phone] ? FAX [phone] °Web site:   [email or web address] °

## 2012-12-27 ENCOUNTER — Encounter (HOSPITAL_COMMUNITY): Payer: Self-pay | Admitting: Emergency Medicine

## 2012-12-27 ENCOUNTER — Emergency Department (HOSPITAL_COMMUNITY)
Admission: EM | Admit: 2012-12-27 | Discharge: 2012-12-28 | Disposition: A | Discharge status: Home / Self Care | Payer: Medicare Other | Attending: Emergency Medicine | Admitting: Emergency Medicine

## 2012-12-27 DIAGNOSIS — T83091A Other mechanical complication of indwelling urethral catheter, initial encounter: Secondary | ICD-10-CM | POA: Insufficient documentation

## 2012-12-27 DIAGNOSIS — N400 Enlarged prostate without lower urinary tract symptoms: Secondary | ICD-10-CM | POA: Diagnosis not present

## 2012-12-27 DIAGNOSIS — R109 Unspecified abdominal pain: Secondary | ICD-10-CM | POA: Diagnosis not present

## 2012-12-27 DIAGNOSIS — Z79899 Other long term (current) drug therapy: Secondary | ICD-10-CM | POA: Diagnosis not present

## 2012-12-27 DIAGNOSIS — Z87891 Personal history of nicotine dependence: Secondary | ICD-10-CM | POA: Diagnosis not present

## 2012-12-27 DIAGNOSIS — R972 Elevated prostate specific antigen [PSA]: Secondary | ICD-10-CM | POA: Diagnosis not present

## 2012-12-27 DIAGNOSIS — Z862 Personal history of diseases of the blood and blood-forming organs and certain disorders involving the immune mechanism: Secondary | ICD-10-CM | POA: Diagnosis not present

## 2012-12-27 DIAGNOSIS — Z872 Personal history of diseases of the skin and subcutaneous tissue: Secondary | ICD-10-CM | POA: Insufficient documentation

## 2012-12-27 DIAGNOSIS — Z8639 Personal history of other endocrine, nutritional and metabolic disease: Secondary | ICD-10-CM | POA: Insufficient documentation

## 2012-12-27 DIAGNOSIS — R310 Gross hematuria: Secondary | ICD-10-CM | POA: Diagnosis not present

## 2012-12-27 DIAGNOSIS — Z8739 Personal history of other diseases of the musculoskeletal system and connective tissue: Secondary | ICD-10-CM | POA: Diagnosis not present

## 2012-12-27 DIAGNOSIS — Z8669 Personal history of other diseases of the nervous system and sense organs: Secondary | ICD-10-CM | POA: Insufficient documentation

## 2012-12-27 DIAGNOSIS — I10 Essential (primary) hypertension: Secondary | ICD-10-CM | POA: Insufficient documentation

## 2012-12-27 DIAGNOSIS — R339 Retention of urine, unspecified: Secondary | ICD-10-CM | POA: Diagnosis not present

## 2012-12-27 DIAGNOSIS — Y846 Urinary catheterization as the cause of abnormal reaction of the patient, or of later complication, without mention of misadventure at the time of the procedure: Secondary | ICD-10-CM | POA: Insufficient documentation

## 2012-12-27 LAB — URINALYSIS, ROUTINE W REFLEX MICROSCOPIC
Bilirubin Urine: NEGATIVE
Glucose, UA: NEGATIVE mg/dL
Ketones, ur: NEGATIVE mg/dL
Nitrite: NEGATIVE
Protein, ur: NEGATIVE mg/dL
Specific Gravity, Urine: 1.008 (ref 1.005–1.030)
Urobilinogen, UA: 0.2 mg/dL (ref 0.0–1.0)
pH: 6.5 (ref 5.0–8.0)

## 2012-12-27 LAB — URINE MICROSCOPIC-ADD ON

## 2012-12-27 NOTE — ED Notes (Signed)
Pt c/o concerns over appendectomy incision site. MD notified.

## 2012-12-27 NOTE — ED Notes (Addendum)
Pt reports that his urinary catheter has not drained since 5 pm. Pt reports that his catheter was inserted this morning at [facility] by Dr. Margarita Grizzle, 13 french catheter in place. Pt reports having a 20 french in place prior to arrival to his appointment this morning and that Dr. Margarita Grizzle had instructed to have a 18 french placed, pt states the 13 french must have been placed in error. Pt states there was a blood clot this morning, however the clot was inadvertently pushed back into the bladder upon attempts at removing the clot by urology. Pt requesting to have catheter flushed. Pt reports having urinary pressure and the urge to void, however he has been unable to void.  Pt is A/Ox4, equal and unlabored respirations, NAD.

## 2012-12-27 NOTE — ED Provider Notes (Addendum)
CSN: [id number]     Arrival date & time 12/27/12  2207 History     First MD Initiated Contact with Patient 12/27/12 2245     Chief Complaint  Patient presents with  . Urinary Retention   (Consider location/radiation/quality/duration/timing/severity/associated sxs/prior Treatment) HPI Comments: Patient with a history of Foley catheter placement secondary to prostate enlargement presents with an obstructed Foley catheter. He saw Dr. Margarita Grizzle at [facility] today and shows 20 French Foley catheter was taken out so that Dr. Margarita Grizzle to do a cystoscopy. A 13 French catheter was replaced. He states that he was having blood clots and that today's catheter clot obstructed. He started having some pressure in his lower abdomen. He was recently admitted for anemia secondary to ongoing hematuria. He was discharged 2 days ago.   Past Medical History  Diagnosis Date  . Hypopotassemia   . Elevated prostate specific antigen (PSA)   . Nonspecific abnormal results of liver function study   . Unspecified glaucoma(365.9)   . Hypertension   . Disorder of bone and cartilage, unspecified   . Edema   . Blind    Past Surgical History  Procedure Laterality Date  . Hernia repair Bilateral 2003    Dr. Laroy Apple  . Eye surgery Bilateral 2009    for glaucoma  . Eye surgery Left 2014  . Laparoscopic appendectomy N/A 12/24/2012    Procedure:  LAPAROSCOPIC APPENDECTOMY ;  Surgeon: Valarie Merino, MD;  Location: [facility];  Service: General;  Laterality: N/A;   Family History  Problem Relation Age of Onset  . Kidney failure Mother    History  Substance Use Topics  . Smoking status: Former Smoker    Types: Cigarettes  . Smokeless tobacco: Never Used  . Alcohol Use: No     Comment: STOPED IN 1970S    Review of Systems  Constitutional: Negative for fever, chills, diaphoresis and fatigue.  HENT: Negative for congestion, rhinorrhea and sneezing.   Eyes: Negative.   Respiratory: Negative for  cough, chest tightness and shortness of breath.   Cardiovascular: Negative for chest pain and leg swelling.  Gastrointestinal: Positive for abdominal pain. Negative for nausea, vomiting, diarrhea and blood in stool.  Genitourinary: Negative for frequency, hematuria, flank pain and difficulty urinating.  Musculoskeletal: Negative for back pain and arthralgias.  Skin: Negative for rash.  Neurological: Negative for dizziness, speech difficulty, weakness, numbness and headaches.    Allergies  Review of patient's allergies indicates no known allergies.  Home Medications   Current Outpatient Rx  Name  Route  Sig  Dispense  Refill  . brimonidine (ALPHAGAN P) 0.1 % SOLN   Both Eyes   Place 1 drop into both eyes 2 (two) times daily.          . ciprofloxacin (CIPRO) 500 MG tablet   Oral   Take 1 tablet (500 mg total) by mouth 2 (two) times daily.   14 tablet   0   . cyanocobalamin 500 MCG tablet   Oral   Take 500 mcg by mouth every morning.         . dorzolamide-timolol (COSOPT) 22.3-6.8 MG/ML ophthalmic solution   Both Eyes   Place 1 drop into both eyes 2 (two) times daily.          . folic acid (FOLVITE) 1 MG tablet   Oral   Take 2 mg by mouth every morning.          . hydrochlorothiazide (HYDRODIURIL) 25 MG tablet  Oral   Take 25 mg by mouth every morning.          Marland Kitchen HYDROcodone-acetaminophen (NORCO/VICODIN) 5-325 MG per tablet   Oral   Take 1-2 tablets by mouth every 4 (four) hours as needed for pain.   60 tablet   0   . potassium chloride SA (K-DUR,KLOR-CON) 20 MEQ tablet   Oral   Take 20 mEq by mouth every morning.         . pyridOXINE (VITAMIN B-6) 100 MG tablet   Oral   Take 100 mg by mouth every morning.         . vitamin C (ASCORBIC ACID) 500 MG tablet   Oral   Take 500 mg by mouth every morning.          BP 141/90  Pulse 64  Temp(Src) 97.9 F (36.6 C) (Oral)  Resp 20  SpO2 99% Physical Exam  Constitutional: He is oriented to  person, place, and time. He appears well-developed and well-nourished.  HENT:  Head: Normocephalic and atraumatic.  Eyes: Pupils are equal, round, and reactive to light.  Neck: Normal range of motion. Neck supple.  Cardiovascular: Normal rate, regular rhythm and normal heart sounds.   Pulmonary/Chest: Effort normal and breath sounds normal. No respiratory distress. He has no wheezes. He has no rales. He exhibits no tenderness.  Abdominal: Soft. Bowel sounds are normal. There is no tenderness. There is no rebound and no guarding.  Musculoskeletal: Normal range of motion. He exhibits no edema.  Lymphadenopathy:    He has no cervical adenopathy.  Neurological: He is alert and oriented to person, place, and time.  Skin: Skin is warm and dry. No rash noted.  Psychiatric: He has a normal mood and affect.    ED Course   Procedures (including critical care time) Results for orders placed during the hospital encounter of 12/27/12  URINALYSIS, ROUTINE W REFLEX MICROSCOPIC      Result Value Range   Color, Urine YELLOW  YELLOW   APPearance CLOUDY (*) CLEAR   Specific Gravity, Urine 1.008  1.005 - 1.030   pH 6.5  5.0 - 8.0   Glucose, UA NEGATIVE  NEGATIVE mg/dL   Hgb urine dipstick LARGE (*) NEGATIVE   Bilirubin Urine NEGATIVE  NEGATIVE   Ketones, ur NEGATIVE  NEGATIVE mg/dL   Protein, ur NEGATIVE  NEGATIVE mg/dL   Urobilinogen, UA 0.2  0.0 - 1.0 mg/dL   Nitrite NEGATIVE  NEGATIVE   Leukocytes, UA MODERATE (*) NEGATIVE  URINE MICROSCOPIC-ADD ON      Result Value Range   Squamous Epithelial / LPF RARE  RARE   WBC, UA 0-2  <3 WBC/hpf   RBC / HPF TOO NUMEROUS TO COUNT  <3 RBC/hpf     No results found. 1. Obstructed Foley catheter, initial encounter     MDM  A Foley catheter was replaced with the 18 French Foley. This appears to be draining well. Patient is currently on Cipro.  Has f/u appt with Dr Gerre Couch, MD 12/27/12 1610  Rolan Bucco, MD 12/27/12 2321

## 2012-12-29 LAB — URINE CULTURE
Colony Count: NO GROWTH
Culture: NO GROWTH

## 2012-12-30 ENCOUNTER — Encounter (HOSPITAL_COMMUNITY): Payer: Self-pay

## 2012-12-30 ENCOUNTER — Emergency Department (HOSPITAL_COMMUNITY)
Admission: EM | Admit: 2012-12-30 | Discharge: 2012-12-30 | Disposition: A | Discharge status: Home / Self Care | Payer: Medicare Other | Attending: Emergency Medicine | Admitting: Emergency Medicine

## 2012-12-30 DIAGNOSIS — Z87891 Personal history of nicotine dependence: Secondary | ICD-10-CM | POA: Diagnosis not present

## 2012-12-30 DIAGNOSIS — R141 Gas pain: Secondary | ICD-10-CM | POA: Insufficient documentation

## 2012-12-30 DIAGNOSIS — N400 Enlarged prostate without lower urinary tract symptoms: Secondary | ICD-10-CM | POA: Diagnosis not present

## 2012-12-30 DIAGNOSIS — H409 Unspecified glaucoma: Secondary | ICD-10-CM | POA: Insufficient documentation

## 2012-12-30 DIAGNOSIS — Z8739 Personal history of other diseases of the musculoskeletal system and connective tissue: Secondary | ICD-10-CM | POA: Insufficient documentation

## 2012-12-30 DIAGNOSIS — Z862 Personal history of diseases of the blood and blood-forming organs and certain disorders involving the immune mechanism: Secondary | ICD-10-CM | POA: Insufficient documentation

## 2012-12-30 DIAGNOSIS — R339 Retention of urine, unspecified: Secondary | ICD-10-CM | POA: Insufficient documentation

## 2012-12-30 DIAGNOSIS — I10 Essential (primary) hypertension: Secondary | ICD-10-CM | POA: Insufficient documentation

## 2012-12-30 DIAGNOSIS — Z9889 Other specified postprocedural states: Secondary | ICD-10-CM | POA: Insufficient documentation

## 2012-12-30 DIAGNOSIS — T8389XA Other specified complication of genitourinary prosthetic devices, implants and grafts, initial encounter: Secondary | ICD-10-CM | POA: Diagnosis not present

## 2012-12-30 DIAGNOSIS — R310 Gross hematuria: Secondary | ICD-10-CM | POA: Insufficient documentation

## 2012-12-30 DIAGNOSIS — Z79899 Other long term (current) drug therapy: Secondary | ICD-10-CM | POA: Diagnosis not present

## 2012-12-30 DIAGNOSIS — IMO0002 Reserved for concepts with insufficient information to code with codable children: Secondary | ICD-10-CM | POA: Insufficient documentation

## 2012-12-30 DIAGNOSIS — Z8669 Personal history of other diseases of the nervous system and sense organs: Secondary | ICD-10-CM | POA: Insufficient documentation

## 2012-12-30 DIAGNOSIS — Y838 Other surgical procedures as the cause of abnormal reaction of the patient, or of later complication, without mention of misadventure at the time of the procedure: Secondary | ICD-10-CM | POA: Insufficient documentation

## 2012-12-30 DIAGNOSIS — T83091A Other mechanical complication of indwelling urethral catheter, initial encounter: Secondary | ICD-10-CM | POA: Diagnosis not present

## 2012-12-30 DIAGNOSIS — T83091D Other mechanical complication of indwelling urethral catheter, subsequent encounter: Secondary | ICD-10-CM

## 2012-12-30 DIAGNOSIS — Z8639 Personal history of other endocrine, nutritional and metabolic disease: Secondary | ICD-10-CM | POA: Insufficient documentation

## 2012-12-30 DIAGNOSIS — R142 Eructation: Secondary | ICD-10-CM | POA: Insufficient documentation

## 2012-12-30 LAB — BASIC METABOLIC PANEL
BUN: 5 mg/dL — ABNORMAL LOW (ref 6–23)
CO2: 24 mEq/L (ref 19–32)
Calcium: 9.2 mg/dL (ref 8.4–10.5)
Chloride: 96 mEq/L (ref 96–112)
Creatinine, Ser: 1.03 mg/dL (ref 0.50–1.35)
GFR calc Af Amer: 85 mL/min — ABNORMAL LOW (ref >90)
GFR calc non Af Amer: 74 mL/min — ABNORMAL LOW (ref >90)
Glucose, Bld: 101 mg/dL — ABNORMAL HIGH (ref 70–99)
Potassium: 3.5 mEq/L (ref 3.5–5.1)
Sodium: 132 mEq/L — ABNORMAL LOW (ref 135–145)

## 2012-12-30 LAB — CBC WITH DIFFERENTIAL/PLATELET
Basophils Absolute: 0.1 10*3/uL (ref 0.0–0.1)
Basophils Relative: 1 % (ref 0–1)
Eosinophils Absolute: 0.2 10*3/uL (ref 0.0–0.7)
Eosinophils Relative: 5 % (ref 0–5)
HCT: 27.8 % — ABNORMAL LOW (ref 39.0–52.0)
Hemoglobin: 9.4 g/dL — ABNORMAL LOW (ref 13.0–17.0)
Lymphocytes Relative: 19 % (ref 12–46)
Lymphs Abs: 0.9 10*3/uL (ref 0.7–4.0)
MCH: 31.8 pg (ref 26.0–34.0)
MCHC: 33.8 g/dL (ref 30.0–36.0)
MCV: 93.9 fL (ref 78.0–100.0)
Monocytes Absolute: 0.4 10*3/uL (ref 0.1–1.0)
Monocytes Relative: 9 % (ref 3–12)
Neutro Abs: 3.3 10*3/uL (ref 1.7–7.7)
Neutrophils Relative %: 66 % (ref 43–77)
Platelets: ADEQUATE 10*3/uL (ref 150–400)
RBC: 2.96 MIL/uL — ABNORMAL LOW (ref 4.22–5.81)
RDW: 13 % (ref 11.5–15.5)
WBC: 4.9 10*3/uL (ref 4.0–10.5)

## 2012-12-30 NOTE — ED Provider Notes (Signed)
CSN: [id number]     Arrival date & time 12/30/12  4098 History     First MD Initiated Contact with Patient 12/30/12 0245     Chief Complaint  Patient presents with  . Urinary Retention    clogged cath   (Consider location/radiation/quality/duration/timing/severity/associated sxs/prior Treatment) HPI 67 yo male presents to the ER with complaint of lower abdominal pressure and blocked foley catheter.  Pt has had recent complicated course with foley over the last 2 weeks with hematuria, BPH, and urinary retention.  He has had intermittent clotting of his catheter requiring ED visits and one admission for anemia, AKI and clogged foley.  Pt was seen by his urologist on 7/25, had cystoscopy done, had ED visit that day for clogged foley.  Pt denies any fevers, n/v/d, sob, chest pain.  Past Medical History  Diagnosis Date  . Hypopotassemia   . Elevated prostate specific antigen (PSA)   . Nonspecific abnormal results of liver function study   . Unspecified glaucoma(365.9)   . Hypertension   . Disorder of bone and cartilage, unspecified   . Edema   . Blind    Past Surgical History  Procedure Laterality Date  . Hernia repair Bilateral 2003    Dr. Laroy Apple  . Eye surgery Bilateral 2009    for glaucoma  . Eye surgery Left 2014  . Laparoscopic appendectomy N/A 12/24/2012    Procedure:  LAPAROSCOPIC APPENDECTOMY ;  Surgeon: Valarie Merino, MD;  Location: [facility];  Service: General;  Laterality: N/A;   Family History  Problem Relation Age of Onset  . Kidney failure Mother    History  Substance Use Topics  . Smoking status: Former Smoker    Types: Cigarettes  . Smokeless tobacco: Never Used  . Alcohol Use: No     Comment: STOPED IN 1970S    Review of Systems  All other systems reviewed and are negative.    Allergies  Review of patient's allergies indicates no known allergies.  Home Medications   Current Outpatient Rx  Name  Route  Sig  Dispense  Refill  . brimonidine  (ALPHAGAN P) 0.1 % SOLN   Both Eyes   Place 1 drop into both eyes 2 (two) times daily.          . ciprofloxacin (CIPRO) 500 MG tablet   Oral   Take 1 tablet (500 mg total) by mouth 2 (two) times daily.   14 tablet   0   . cyanocobalamin 500 MCG tablet   Oral   Take 500 mcg by mouth every morning.         . dorzolamide-timolol (COSOPT) 22.3-6.8 MG/ML ophthalmic solution   Both Eyes   Place 1 drop into both eyes 2 (two) times daily.          . folic acid (FOLVITE) 1 MG tablet   Oral   Take 2 mg by mouth every morning.          . hydrochlorothiazide (HYDRODIURIL) 25 MG tablet   Oral   Take 25 mg by mouth every morning.          Marland Kitchen HYDROcodone-acetaminophen (NORCO/VICODIN) 5-325 MG per tablet   Oral   Take 1-2 tablets by mouth every 4 (four) hours as needed for pain.   60 tablet   0   . potassium chloride SA (K-DUR,KLOR-CON) 20 MEQ tablet   Oral   Take 20 mEq by mouth every morning.         Marland Kitchen  pyridOXINE (VITAMIN B-6) 100 MG tablet   Oral   Take 100 mg by mouth every morning.         . vitamin C (ASCORBIC ACID) 500 MG tablet   Oral   Take 500 mg by mouth every morning.          BP 143/92  Pulse 70  Temp(Src) 98.6 F (37 C) (Oral)  Resp 16  Ht 5\' 8"  (1.727 m)  Wt 200 lb (90.719 kg)  BMI 30.42 kg/m2  SpO2 99% Physical Exam  Nursing note and vitals reviewed. Constitutional: He is oriented to person, place, and time. He appears well-developed and well-nourished. He appears distressed.  HENT:  Head: Normocephalic and atraumatic.  Nose: Nose normal.  Mouth/Throat: Oropharynx is clear and moist.  Eyes: Conjunctivae and EOM are normal. Pupils are equal, round, and reactive to light.  Neck: Normal range of motion. Neck supple. No JVD present. No tracheal deviation present. No thyromegaly present.  Cardiovascular: Normal rate, regular rhythm, normal heart sounds and intact distal pulses.  Exam reveals no gallop and no friction rub.   No murmur  heard. Pulmonary/Chest: Effort normal and breath sounds normal. No stridor. No respiratory distress. He has no wheezes. He has no rales. He exhibits no tenderness.  Abdominal: Soft. Bowel sounds are normal. He exhibits distension. He exhibits no mass. There is tenderness. There is no rebound and no guarding.  Musculoskeletal: Normal range of motion. He exhibits no edema and no tenderness.  Lymphadenopathy:    He has no cervical adenopathy.  Neurological: He is alert and oriented to person, place, and time. He exhibits normal muscle tone. Coordination normal.  Skin: Skin is warm and dry. No rash noted. No erythema. No pallor.  Psychiatric: He has a normal mood and affect. His behavior is normal. Judgment and thought content normal.    ED Course   Procedures (including critical care time)  Labs Reviewed  CBC WITH DIFFERENTIAL - Abnormal; Notable for the following:    RBC 2.96 (*)    Hemoglobin 9.4 (*)    HCT 27.8 (*)    All other components within normal limits  BASIC METABOLIC PANEL - Abnormal; Notable for the following:    Sodium 132 (*)    Glucose, Bld 101 (*)    BUN 5 (*)    GFR calc non Af Amer 74 (*)    GFR calc Af Amer 85 (*)    All other components within normal limits   No results found. 1. Obstructed Foley catheter, subsequent encounter   2. Hematuria, gross     MDM  67 yo male with obstructed foley.  It has been irrigated and is flowing well now.  CBC and bmp checked given recent admission for anemia, AKI, has improved.  Will d/c to f/u with urology.  Olivia Mackie, MD 12/30/12 0430

## 2013-01-03 DIAGNOSIS — R310 Gross hematuria: Secondary | ICD-10-CM | POA: Diagnosis not present

## 2013-01-03 DIAGNOSIS — R339 Retention of urine, unspecified: Secondary | ICD-10-CM | POA: Diagnosis not present

## 2013-01-05 ENCOUNTER — Encounter (HOSPITAL_COMMUNITY): Payer: Self-pay | Admitting: *Deleted

## 2013-01-05 ENCOUNTER — Emergency Department (HOSPITAL_COMMUNITY)
Admission: EM | Admit: 2013-01-05 | Discharge: 2013-01-05 | Disposition: A | Discharge status: Home / Self Care | Payer: Medicare Other | Attending: Emergency Medicine | Admitting: Emergency Medicine

## 2013-01-05 DIAGNOSIS — Z87891 Personal history of nicotine dependence: Secondary | ICD-10-CM | POA: Diagnosis not present

## 2013-01-05 DIAGNOSIS — I10 Essential (primary) hypertension: Secondary | ICD-10-CM | POA: Diagnosis not present

## 2013-01-05 DIAGNOSIS — Z8639 Personal history of other endocrine, nutritional and metabolic disease: Secondary | ICD-10-CM | POA: Insufficient documentation

## 2013-01-05 DIAGNOSIS — Z862 Personal history of diseases of the blood and blood-forming organs and certain disorders involving the immune mechanism: Secondary | ICD-10-CM | POA: Diagnosis not present

## 2013-01-05 DIAGNOSIS — Z8739 Personal history of other diseases of the musculoskeletal system and connective tissue: Secondary | ICD-10-CM | POA: Diagnosis not present

## 2013-01-05 DIAGNOSIS — H543 Unqualified visual loss, both eyes: Secondary | ICD-10-CM | POA: Diagnosis not present

## 2013-01-05 DIAGNOSIS — Z79899 Other long term (current) drug therapy: Secondary | ICD-10-CM | POA: Diagnosis not present

## 2013-01-05 DIAGNOSIS — T8389XA Other specified complication of genitourinary prosthetic devices, implants and grafts, initial encounter: Secondary | ICD-10-CM | POA: Diagnosis not present

## 2013-01-05 DIAGNOSIS — Y846 Urinary catheterization as the cause of abnormal reaction of the patient, or of later complication, without mention of misadventure at the time of the procedure: Secondary | ICD-10-CM | POA: Insufficient documentation

## 2013-01-05 DIAGNOSIS — T83091A Other mechanical complication of indwelling urethral catheter, initial encounter: Secondary | ICD-10-CM | POA: Diagnosis not present

## 2013-01-05 DIAGNOSIS — Z8669 Personal history of other diseases of the nervous system and sense organs: Secondary | ICD-10-CM | POA: Diagnosis not present

## 2013-01-05 NOTE — ED Provider Notes (Signed)
CSN: [id number]     Arrival date & time 01/05/13  1758 History     First MD Initiated Contact with Patient 01/05/13 1806     Chief Complaint  Patient presents with  . catheter blockage    (Consider location/radiation/quality/duration/timing/severity/associated sxs/prior Treatment) HPI Comments: Clogged foley. Recent admission for AKI, however labs one week ago normal. Has been having hematuria, has cystoscopy  Patient is a 67 y.o. male presenting with general illness.  Illness Location:  Foley blockage Quality:  Complete foley blockage Severity:  Moderate Onset quality:  Sudden Duration:  5 hours Timing:  Constant Progression:  Worsening Chronicity:  Recurrent Context:  Hematuria Relieved by:  Nothing Worsened by:  Nothing Associated symptoms: no abdominal pain, no cough, no fever, no shortness of breath and no vomiting     Past Medical History  Diagnosis Date  . Hypopotassemia   . Elevated prostate specific antigen (PSA)   . Nonspecific abnormal results of liver function study   . Unspecified glaucoma(365.9)   . Hypertension   . Disorder of bone and cartilage, unspecified   . Edema   . Blind    Past Surgical History  Procedure Laterality Date  . Hernia repair Bilateral 2003    Dr. Laroy Apple  . Eye surgery Bilateral 2009    for glaucoma  . Eye surgery Left 2014  . Laparoscopic appendectomy N/A 12/24/2012    Procedure:  LAPAROSCOPIC APPENDECTOMY ;  Surgeon: Valarie Merino, MD;  Location: [facility];  Service: General;  Laterality: N/A;   Family History  Problem Relation Age of Onset  . Kidney failure Mother    History  Substance Use Topics  . Smoking status: Former Smoker    Types: Cigarettes  . Smokeless tobacco: Never Used  . Alcohol Use: No     Comment: STOPED IN 1970S    Review of Systems  Constitutional: Negative for fever.  Respiratory: Negative for cough and shortness of breath.   Gastrointestinal: Negative for vomiting and abdominal pain.  All  other systems reviewed and are negative.    Allergies  Review of patient's allergies indicates no known allergies.  Home Medications   Current Outpatient Rx  Name  Route  Sig  Dispense  Refill  . brimonidine (ALPHAGAN P) 0.1 % SOLN   Both Eyes   Place 1 drop into both eyes 2 (two) times daily.          . ciprofloxacin (CIPRO) 500 MG tablet   Oral   Take 1 tablet (500 mg total) by mouth 2 (two) times daily.   14 tablet   0   . cyanocobalamin 500 MCG tablet   Oral   Take 500 mcg by mouth every morning.         . dorzolamide-timolol (COSOPT) 22.3-6.8 MG/ML ophthalmic solution   Both Eyes   Place 1 drop into both eyes 2 (two) times daily.          . folic acid (FOLVITE) 1 MG tablet   Oral   Take 2 mg by mouth every morning.          . hydrochlorothiazide (HYDRODIURIL) 25 MG tablet   Oral   Take 25 mg by mouth every morning.          Marland Kitchen HYDROcodone-acetaminophen (NORCO/VICODIN) 5-325 MG per tablet   Oral   Take 1-2 tablets by mouth every 4 (four) hours as needed for pain.   60 tablet   0   . potassium chloride SA (K-DUR,KLOR-CON)  20 MEQ tablet   Oral   Take 20 mEq by mouth every morning.         . pyridOXINE (VITAMIN B-6) 100 MG tablet   Oral   Take 100 mg by mouth every morning.         . vitamin C (ASCORBIC ACID) 500 MG tablet   Oral   Take 500 mg by mouth every morning.          BP 148/94  Pulse 101  Temp(Src) 98.6 F (37 C) (Oral)  Resp 18  SpO2 99% Physical Exam  Nursing note and vitals reviewed. Constitutional: He is oriented to person, place, and time. He appears well-developed and well-nourished. No distress.  HENT:  Head: Normocephalic and atraumatic.  Mouth/Throat: No oropharyngeal exudate.  Eyes: EOM are normal. Pupils are equal, round, and reactive to light.  Neck: Normal range of motion. Neck supple.  Cardiovascular: Normal rate and regular rhythm.  Exam reveals no friction rub.   No murmur heard. Pulmonary/Chest: Effort  normal and breath sounds normal. No respiratory distress. He has no wheezes. He has no rales.  Abdominal: He exhibits no distension. There is no tenderness. There is no rebound.  Musculoskeletal: Normal range of motion. He exhibits no edema.  Neurological: He is alert and oriented to person, place, and time.  Skin: He is not diaphoretic.    ED Course   Procedures (including critical care time)  Labs Reviewed - No data to display No results found. 1. Obstructed Foley catheter, initial encounter     MDM  [id number] with hx of BPH, HTN presenting with clogged foley. Recent foley placement for urinary retention. He has had hematuria and had had subsequent cystoscopy by Urology. He's also had recent admission for AKi, however when here a week ago for his foley being clogged, labs at that time were normal.  No other complaints today, just wants his foley flushed. He had Happened previously, earlier this week. Flushed here by me with return of roughly 900cc of bloody urine. He has had hematuria and this is not new. He has f/u with Urology soon. I do not feel he needs labs today. Stable for discharge.   Dagmar Hait, MD 01/05/13 [id number]

## 2013-01-05 NOTE — ED Notes (Signed)
Pt states his foley catheter is blocked, been blocked since 1400 today, pt states having bladder discomfort.

## 2013-01-06 ENCOUNTER — Encounter (HOSPITAL_COMMUNITY): Payer: Self-pay

## 2013-01-06 ENCOUNTER — Emergency Department (HOSPITAL_COMMUNITY)
Admission: EM | Admit: 2013-01-06 | Discharge: 2013-01-07 | Disposition: A | Discharge status: Home / Self Care | Payer: Medicare Other | Attending: Emergency Medicine | Admitting: Emergency Medicine

## 2013-01-06 DIAGNOSIS — Z87891 Personal history of nicotine dependence: Secondary | ICD-10-CM | POA: Diagnosis not present

## 2013-01-06 DIAGNOSIS — Z9089 Acquired absence of other organs: Secondary | ICD-10-CM | POA: Diagnosis not present

## 2013-01-06 DIAGNOSIS — I10 Essential (primary) hypertension: Secondary | ICD-10-CM | POA: Diagnosis not present

## 2013-01-06 DIAGNOSIS — Y846 Urinary catheterization as the cause of abnormal reaction of the patient, or of later complication, without mention of misadventure at the time of the procedure: Secondary | ICD-10-CM | POA: Insufficient documentation

## 2013-01-06 DIAGNOSIS — T83091A Other mechanical complication of indwelling urethral catheter, initial encounter: Secondary | ICD-10-CM | POA: Insufficient documentation

## 2013-01-06 DIAGNOSIS — Z862 Personal history of diseases of the blood and blood-forming organs and certain disorders involving the immune mechanism: Secondary | ICD-10-CM | POA: Insufficient documentation

## 2013-01-06 DIAGNOSIS — T8389XA Other specified complication of genitourinary prosthetic devices, implants and grafts, initial encounter: Secondary | ICD-10-CM | POA: Diagnosis not present

## 2013-01-06 DIAGNOSIS — Z8639 Personal history of other endocrine, nutritional and metabolic disease: Secondary | ICD-10-CM | POA: Insufficient documentation

## 2013-01-06 DIAGNOSIS — R319 Hematuria, unspecified: Secondary | ICD-10-CM | POA: Diagnosis not present

## 2013-01-06 DIAGNOSIS — Z8669 Personal history of other diseases of the nervous system and sense organs: Secondary | ICD-10-CM | POA: Insufficient documentation

## 2013-01-06 DIAGNOSIS — T839XXD Unspecified complication of genitourinary prosthetic device, implant and graft, subsequent encounter: Secondary | ICD-10-CM

## 2013-01-06 DIAGNOSIS — H547 Unspecified visual loss: Secondary | ICD-10-CM | POA: Diagnosis not present

## 2013-01-06 DIAGNOSIS — Z79899 Other long term (current) drug therapy: Secondary | ICD-10-CM | POA: Insufficient documentation

## 2013-01-06 DIAGNOSIS — Z8739 Personal history of other diseases of the musculoskeletal system and connective tissue: Secondary | ICD-10-CM | POA: Insufficient documentation

## 2013-01-06 NOTE — ED Notes (Signed)
Pt reports his urinary catheter has clogged up with blood clots starting today

## 2013-01-07 NOTE — ED Notes (Signed)
Foley irrigated. No return of urine noted.

## 2013-01-07 NOTE — ED Provider Notes (Signed)
CSN: [id number]     Arrival date & time 01/06/13  2325 History     First MD Initiated Contact with Patient 01/07/13 0022     Chief Complaint  Patient presents with  . Urinary Retention   (Consider location/radiation/quality/duration/timing/severity/associated sxs/prior Treatment) HPI 67 yo male presents to the ER from home with complaint of clogged foley.  Pt has had problems with his foley for the past month with several ED visits for clogging due to blood clots.  Pt is being followed by Urology.  Pt also had appendectomy done on 7/22, being followed by Dr Daphine Deutscher.  Pt has not yet been told what is causing his hematuria.  Pt was seen by urology last week, and reports they were going to do a trial of removing foley, but given recent surgery were planning on waiting for another 2 weeks.  No fevers, chills, n/v//d.     Past Medical History  Diagnosis Date  . Hypopotassemia   . Elevated prostate specific antigen (PSA)   . Nonspecific abnormal results of liver function study   . Unspecified glaucoma(365.9)   . Hypertension   . Disorder of bone and cartilage, unspecified   . Edema   . Blind    Past Surgical History  Procedure Laterality Date  . Hernia repair Bilateral 2003    Dr. Laroy Apple  . Eye surgery Bilateral 2009    for glaucoma  . Eye surgery Left 2014  . Laparoscopic appendectomy N/A 12/24/2012    Procedure:  LAPAROSCOPIC APPENDECTOMY ;  Surgeon: Valarie Merino, MD;  Location: [facility];  Service: General;  Laterality: N/A;   Family History  Problem Relation Age of Onset  . Kidney failure Mother    History  Substance Use Topics  . Smoking status: Former Smoker    Types: Cigarettes  . Smokeless tobacco: Never Used  . Alcohol Use: No     Comment: STOPED IN 1970S    Review of Systems  All other systems reviewed and are negative.    Allergies  Review of patient's allergies indicates no known allergies.  Home Medications   Current Outpatient Rx  Name  Route   Sig  Dispense  Refill  . brimonidine (ALPHAGAN P) 0.1 % SOLN   Both Eyes   Place 1 drop into both eyes 2 (two) times daily.          . ciprofloxacin (CIPRO) 500 MG tablet   Oral   Take 1 tablet (500 mg total) by mouth 2 (two) times daily.   14 tablet   0   . cyanocobalamin 500 MCG tablet   Oral   Take 500 mcg by mouth every morning.         . dorzolamide-timolol (COSOPT) 22.3-6.8 MG/ML ophthalmic solution   Both Eyes   Place 1 drop into both eyes 2 (two) times daily.          . folic acid (FOLVITE) 1 MG tablet   Oral   Take 2 mg by mouth every morning.          . hydrochlorothiazide (HYDRODIURIL) 25 MG tablet   Oral   Take 25 mg by mouth every morning.          Marland Kitchen HYDROcodone-acetaminophen (NORCO/VICODIN) 5-325 MG per tablet   Oral   Take 1-2 tablets by mouth every 4 (four) hours as needed for pain.   60 tablet   0   . potassium chloride SA (K-DUR,KLOR-CON) 20 MEQ tablet   Oral  Take 20 mEq by mouth every morning.         . pyridOXINE (VITAMIN B-6) 100 MG tablet   Oral   Take 100 mg by mouth every morning.         . vitamin C (ASCORBIC ACID) 500 MG tablet   Oral   Take 500 mg by mouth every morning.          BP 128/84  Pulse 68  Temp(Src) 98.6 F (37 C) (Oral)  Resp 20  SpO2 99% Physical Exam  Nursing note and vitals reviewed. Constitutional: He appears well-developed and well-nourished. No distress.  HENT:  Head: Normocephalic and atraumatic.  Nose: Nose normal.  Mouth/Throat: Oropharynx is clear and moist.  Cardiovascular: Normal rate, regular rhythm, normal heart sounds and intact distal pulses.  Exam reveals no gallop and no friction rub.   No murmur heard. Pulmonary/Chest: Effort normal and breath sounds normal. No respiratory distress. He has no wheezes. He has no rales. He exhibits no tenderness.  Abdominal: Soft. Bowel sounds are normal. He exhibits no distension and no mass. There is no tenderness. There is no rebound and no  guarding.  Genitourinary:  Foley catheter in place draining reddish urine with fibrinous material and small clote    ED Course   Procedures (including critical care time)  Labs Reviewed - No data to display No results found. 1. Foley catheter problem, subsequent encounter     MDM  67 yo male with clogged foley, an ongoing problem for him.  After replacement of the foley by nursing staff, he was back to his baseline.  Due to ED volume I was unable to see him prior to placement of the foley.  Will send message to his urologist for help in keeping him out of the ER with these repeat problems with his foley.    Olivia Mackie, MD 01/07/13 [id number]

## 2013-01-07 NOTE — ED Notes (Signed)
Bladder scanned: 41ml

## 2013-01-07 NOTE — ED Notes (Signed)
After this nurse removed occluded indwelling foley, patient urinated out multiple medium sized clots. New foley inserted and draining without problem

## 2013-01-07 NOTE — ED Notes (Signed)
Drainage bag disconnected and leg bag attached for patient to be discharged home with.

## 2013-01-10 DIAGNOSIS — R972 Elevated prostate specific antigen [PSA]: Secondary | ICD-10-CM | POA: Diagnosis not present

## 2013-01-10 DIAGNOSIS — N400 Enlarged prostate without lower urinary tract symptoms: Secondary | ICD-10-CM | POA: Diagnosis not present

## 2013-01-10 DIAGNOSIS — R339 Retention of urine, unspecified: Secondary | ICD-10-CM | POA: Diagnosis not present

## 2013-01-10 DIAGNOSIS — R310 Gross hematuria: Secondary | ICD-10-CM | POA: Diagnosis not present

## 2013-01-14 ENCOUNTER — Encounter (INDEPENDENT_AMBULATORY_CARE_PROVIDER_SITE_OTHER): Payer: Self-pay

## 2013-01-14 ENCOUNTER — Encounter: Payer: Medicare Other | Admitting: Internal Medicine

## 2013-01-14 ENCOUNTER — Ambulatory Visit (INDEPENDENT_AMBULATORY_CARE_PROVIDER_SITE_OTHER): Payer: Medicare Other | Admitting: General Surgery

## 2013-01-14 VITALS — BP 130/62 | HR 64 | Temp 97.2°F | Resp 20 | Ht 68.5 in | Wt 206.4 lb

## 2013-01-14 DIAGNOSIS — Z5189 Encounter for other specified aftercare: Secondary | ICD-10-CM

## 2013-01-14 DIAGNOSIS — T814XXD Infection following a procedure, subsequent encounter: Secondary | ICD-10-CM

## 2013-01-14 DIAGNOSIS — D121 Benign neoplasm of appendix: Secondary | ICD-10-CM

## 2013-01-14 DIAGNOSIS — D126 Benign neoplasm of colon, unspecified: Secondary | ICD-10-CM

## 2013-01-14 NOTE — Progress Notes (Signed)
[name] [name] [birth year] [id number] 01/14/2013   History of Present Illness: [name] [name]. is a  67 y.o. male who presents today status post lap appy by Dr. Daphine Deutscher.  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  The patient is tolerating a regular diet, having normal bowel movements, has good pain control.  He  is back to most normal activities.   Physical Exam: Abd: soft, nontender, active bowel sounds, nondistended.  All incisions are well healed.  Impression: 1.  Acute appendicitis, s/p lap appy  Plan: He  is able to return to normal activities. He  may follow up on a prn basis.

## 2013-01-14 NOTE — Patient Instructions (Signed)
Resume daily activities and regular diet Follow up in 2 weeks with Dr. Daphine Deutscher

## 2013-01-28 ENCOUNTER — Encounter: Payer: Medicare Other | Admitting: Internal Medicine

## 2013-01-31 ENCOUNTER — Encounter (INDEPENDENT_AMBULATORY_CARE_PROVIDER_SITE_OTHER): Payer: Medicare Other | Admitting: Surgery

## 2013-02-04 ENCOUNTER — Ambulatory Visit: Payer: Medicare Other | Admitting: Internal Medicine

## 2013-02-04 ENCOUNTER — Encounter: Payer: Medicare Other | Admitting: Internal Medicine

## 2013-03-28 ENCOUNTER — Other Ambulatory Visit: Payer: Self-pay | Admitting: Internal Medicine

## 2013-04-29 ENCOUNTER — Other Ambulatory Visit: Payer: Self-pay | Admitting: Internal Medicine

## 2013-04-30 ENCOUNTER — Other Ambulatory Visit: Payer: Self-pay | Admitting: *Deleted

## 2013-04-30 ENCOUNTER — Other Ambulatory Visit: Payer: Self-pay | Admitting: Internal Medicine

## 2013-04-30 MED ORDER — DORZOLAMIDE HCL-TIMOLOL MAL 2-0.5 % OP SOLN: 1.0000 [drp] | Freq: Two times a day (BID) | OPHTHALMIC | Status: DC

## 2013-07-13 ENCOUNTER — Other Ambulatory Visit: Payer: Self-pay | Admitting: Nurse Practitioner

## 2013-12-23 ENCOUNTER — Telehealth: Payer: Self-pay | Admitting: *Deleted

## 2013-12-23 NOTE — Telephone Encounter (Signed)
Patient called and stated that he needs a work note regarding the Hydrochlorothiazide which causes him to frequently go to the bathroom and wants a note from the Dr. Edison Simon he needs to be excused for regular visits to the men's restroom.  Patient wants it mailed to him. Please Advise. [place] [id number]  [phone]

## 2013-12-23 NOTE — Telephone Encounter (Signed)
i have not seen patient in more than a year. i cannot provide note until i re-evaluate patient and maybe in that visit, we can change his bp medication to something else.

## 2013-12-24 NOTE — Telephone Encounter (Signed)
Notified patient and he said ok and hung up on me while trying to give him an appointment.

## 2014-03-10 ENCOUNTER — Encounter: Payer: Medicare Other | Admitting: Internal Medicine

## 2015-02-05 DIAGNOSIS — H40051 Ocular hypertension, right eye: Secondary | ICD-10-CM | POA: Diagnosis not present

## 2015-02-05 DIAGNOSIS — H2513 Age-related nuclear cataract, bilateral: Secondary | ICD-10-CM | POA: Diagnosis not present

## 2015-02-05 DIAGNOSIS — H4011X3 Primary open-angle glaucoma, severe stage: Secondary | ICD-10-CM | POA: Diagnosis not present

## 2015-02-05 DIAGNOSIS — H35033 Hypertensive retinopathy, bilateral: Secondary | ICD-10-CM | POA: Diagnosis not present

## 2015-11-04 DIAGNOSIS — H18601 Keratoconus, unspecified, right eye: Secondary | ICD-10-CM | POA: Diagnosis not present

## 2015-11-04 DIAGNOSIS — H2513 Age-related nuclear cataract, bilateral: Secondary | ICD-10-CM | POA: Diagnosis not present

## 2015-11-04 DIAGNOSIS — H401133 Primary open-angle glaucoma, bilateral, severe stage: Secondary | ICD-10-CM | POA: Diagnosis not present

## 2015-11-08 DIAGNOSIS — H1711 Central corneal opacity, right eye: Secondary | ICD-10-CM | POA: Diagnosis not present

## 2015-11-08 DIAGNOSIS — H401133 Primary open-angle glaucoma, bilateral, severe stage: Secondary | ICD-10-CM | POA: Diagnosis not present

## 2015-11-08 DIAGNOSIS — H2513 Age-related nuclear cataract, bilateral: Secondary | ICD-10-CM | POA: Diagnosis not present

## 2015-11-08 DIAGNOSIS — H52213 Irregular astigmatism, bilateral: Secondary | ICD-10-CM | POA: Diagnosis not present

## 2015-11-17 ENCOUNTER — Encounter: Payer: Medicare Other | Admitting: Internal Medicine

## 2015-11-19 DIAGNOSIS — H401133 Primary open-angle glaucoma, bilateral, severe stage: Secondary | ICD-10-CM | POA: Diagnosis not present

## 2015-12-17 DIAGNOSIS — H401133 Primary open-angle glaucoma, bilateral, severe stage: Secondary | ICD-10-CM | POA: Diagnosis not present

## 2015-12-17 DIAGNOSIS — H2513 Age-related nuclear cataract, bilateral: Secondary | ICD-10-CM | POA: Diagnosis not present

## 2015-12-17 DIAGNOSIS — H18601 Keratoconus, unspecified, right eye: Secondary | ICD-10-CM | POA: Diagnosis not present

## 2016-02-16 ENCOUNTER — Ambulatory Visit (INDEPENDENT_AMBULATORY_CARE_PROVIDER_SITE_OTHER): Payer: Medicare Other | Admitting: Internal Medicine

## 2016-02-16 ENCOUNTER — Encounter: Payer: Self-pay | Admitting: Internal Medicine

## 2016-02-16 VITALS — BP 122/86 | HR 63 | Temp 98.1°F | Resp 20 | Ht 69.0 in | Wt 219.6 lb

## 2016-02-16 DIAGNOSIS — I10 Essential (primary) hypertension: Secondary | ICD-10-CM

## 2016-02-16 DIAGNOSIS — B351 Tinea unguium: Secondary | ICD-10-CM

## 2016-02-16 DIAGNOSIS — R600 Localized edema: Secondary | ICD-10-CM

## 2016-02-16 DIAGNOSIS — Z0000 Encounter for general adult medical examination without abnormal findings: Secondary | ICD-10-CM | POA: Diagnosis not present

## 2016-02-16 DIAGNOSIS — N400 Enlarged prostate without lower urinary tract symptoms: Secondary | ICD-10-CM

## 2016-02-16 DIAGNOSIS — Z136 Encounter for screening for cardiovascular disorders: Secondary | ICD-10-CM

## 2016-02-16 DIAGNOSIS — R972 Elevated prostate specific antigen [PSA]: Secondary | ICD-10-CM

## 2016-02-16 LAB — CBC WITH DIFFERENTIAL/PLATELET
Basophils Absolute: 55 cells/uL (ref 0–200)
Basophils Relative: 1 %
Eosinophils Absolute: 55 cells/uL (ref 15–500)
Eosinophils Relative: 1 %
HCT: 40.9 % (ref 38.5–50.0)
Hemoglobin: 14 g/dL (ref 13.2–17.1)
Lymphocytes Relative: 25 %
Lymphs Abs: 1375 cells/uL (ref 850–3900)
MCH: 30.7 pg (ref 27.0–33.0)
MCHC: 34.2 g/dL (ref 32.0–36.0)
MCV: 89.7 fL (ref 80.0–100.0)
MPV: 11.9 fL (ref 7.5–12.5)
Monocytes Absolute: 385 cells/uL (ref 200–950)
Monocytes Relative: 7 %
Neutro Abs: 3630 cells/uL (ref 1500–7800)
Neutrophils Relative %: 66 %
Platelets: 189 10*3/uL (ref 140–400)
RBC: 4.56 MIL/uL (ref 4.20–5.80)
RDW: 13.4 % (ref 11.0–15.0)
WBC: 5.5 10*3/uL (ref 3.8–10.8)

## 2016-02-16 MED ORDER — HYDROCHLOROTHIAZIDE 25 MG PO TABS: 25.0000 mg | ORAL_TABLET | Freq: Every day | ORAL | 6 refills | Status: DC

## 2016-02-16 NOTE — Progress Notes (Signed)
Patient ID: [name] Brabson., male   DOB: [birth year], 70 y.o.   MRN: [id number]   Location:  [facility]  Place of Service:  OFFICE  Provider: Arletha Grippe, DO  Patient Care Team: Gildardo Cranker, DO as PCP - General (Internal Medicine)  Extended Emergency Contact Information Primary Emergency Contact: Fosnaugh,Bobby  [place] Phone: [phone] Relation: Brother  Code Status: FULL CODE Goals of Care: Advanced Directive information Advanced Directives 02/16/2016  Does patient have an advance directive? No  Would patient like information on creating an advanced directive? Yes - Educational materials given  Pre-existing out of facility DNR order (yellow form or pink MOST form) -     Chief Complaint  Patient presents with  . Medical Management of Chronic Issues    New pt; CPE  . MMSE    30/30, Passed Clock Test    HPI: Patient is a 70 y.o. male seen in today as a new pt annual wellness exam. He has not been seen in this office since 2014  HTN - has been off HCTZ for some time. He c/o LE swelling.  Hypokalemia - no longer takes potassium supplement  Glaucoma - stable on cosopt and alphagan gtts. 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 Followed by Dr Katy Fitch  Hx appendiceal mucinous neoplasm - low grade. dx in 01/2013. surgically resected with neg margins. No further tx necessary.   Elevated PSA/hx BPH - 14.1 PSA in 2014. Has seen urology in past (while living in [place] and again few yrs ago with Dr Jasmine December), Tried proscar in past but it caused "vision to go yellow" and he stopped med. Prefers not to take alpha blockers due to glaucoma. He has never had prostate bx in past.  Takes vitamins daily   Depression screen [facility] 2/9 09/17/2012  Decreased Interest 0  Down, Depressed, Hopeless 0  PHQ - 2 Score 0    Fall Risk  02/16/2016 09/17/2012  Falls in the past year? No No   MMSE - Mini Mental State Exam 02/16/2016  Orientation to time 5  Orientation to Place 5    Registration 3  Attention/ Calculation 5  Recall 3  Language- name 2 objects 2  Language- repeat 1  Language- follow 3 step command 3  Language- read & follow direction 1  Write a sentence 1  Copy design 1  Total score 30     Health Maintenance  Topic Date Due  . INFLUENZA VACCINE  05/05/2016 (Originally 01/04/2016)  . PNA vac Low Risk Adult (1 of 2 - PCV13) 05/05/2016 (Originally 03/07/2011)  . ZOSTAVAX  02/15/2017 (Originally 03/06/2006)  . Hepatitis C Screening  02/15/2017 (Originally January 07, 1946)  . COLONOSCOPY  02/15/2026 (Originally 03/06/1996)  . TETANUS/TDAP  02/15/2026 (Originally 03/06/1965)    Urinary incontinence? Has urinary urgency/frequency but no leakage  Functional Status Survey: Is the patient deaf or have difficulty hearing?: No Does the patient have difficulty seeing, even when wearing glasses/contacts?: No (he wears eyeglasses) Does the patient have difficulty concentrating, remembering, or making decisions?: No Does the patient have difficulty walking or climbing stairs?: No Does the patient have difficulty dressing or bathing?: No Does the patient have difficulty doing errands alone such as visiting a doctor's office or shopping?: Yes (due to poor vision)  Exercise? Yes - uses bench press machine, sit ups, trunk twists, curls, reverse sit ups, neck bridges. He does not walk as much as he used  Diet? Consumes lots of vegetables and fruits, whole wheat bread, lean  meats and salmon. Bakes meats and steams veggies. Eats brown rice  No exam data present  Hearing: no issues    Dentition: he has not seen dentist since 2014. No longer has dental insurance  Pain: none at this time  Past Medical History:  Diagnosis Date  . Blind   . Disorder of bone and cartilage, unspecified   . Edema   . Elevated prostate specific antigen (PSA)   . Hypertension   . Hypopotassemia   . Nonspecific abnormal results of liver function study   . Unspecified glaucoma      Past Surgical History:  Procedure Laterality Date  . EYE SURGERY Bilateral 2009   for glaucoma  . EYE SURGERY Left 2014  . HERNIA REPAIR Bilateral 2003   Dr. Jeraldine Loots  . LAPAROSCOPIC APPENDECTOMY N/A 12/24/2012   Procedure:  LAPAROSCOPIC APPENDECTOMY ;  Surgeon: Pedro Earls, MD;  Location: [facility];  Service: General;  Laterality: N/A;    Family History  Problem Relation Age of Onset  . Kidney failure Mother    Family Status  Relation Status  . Mother Deceased  . Father Deceased    Social History   Social History  . Marital status: Single    Spouse name: N/A  . Number of children: N/A  . Years of education: N/A   Occupational History  . Not on file.   Social History Main Topics  . Smoking status: Former Smoker    Types: Cigarettes  . Smokeless tobacco: Never Used  . Alcohol use No     Comment: STOPED IN 1970S  . Drug use: No  . Sexual activity: No   Other Topics Concern  . Not on file   Social History Narrative  . No narrative on file    No Known Allergies    Medication List       Accurate as of 02/16/16  9:54 PM. Always use your most recent med list.          brimonidine 0.1 % Soln Commonly known as:  ALPHAGAN P Place 1 drop into both eyes 2 (two) times daily.   cyanocobalamin 500 MCG tablet Take 500 mcg by mouth every morning.   dorzolamide-timolol 22.3-6.8 MG/ML ophthalmic solution Commonly known as:  COSOPT APPOINTMENT OVERDUE, instill 1 drop in both eyes twice daily   folic acid 1 MG tablet Commonly known as:  FOLVITE Take 2 mg by mouth every morning.   hydrochlorothiazide 25 MG tablet Commonly known as:  HYDRODIURIL Take 1 tablet (25 mg total) by mouth daily.   pyridOXINE 100 MG tablet Commonly known as:  VITAMIN B-6 Take 100 mg by mouth every morning.   vitamin C 500 MG tablet Commonly known as:  ASCORBIC ACID Take 500 mg by mouth every morning.        Review of Systems:  Review of Systems  All other  systems reviewed and are negative.   Physical Exam: Vitals:   02/16/16 0957  BP: 122/86  Pulse: 63  Resp: 20  Temp: 98.1 F (36.7 C)  TempSrc: Oral  SpO2: 97%  Weight: 219 lb 9.6 oz (99.6 kg)  Height: 5' 9" (1.753 m)   Body mass index is 32.43 kg/m. Physical Exam  Constitutional: He is oriented to person, place, and time. He appears well-developed and well-nourished. No distress.  HENT:  Head: Normocephalic and atraumatic.  Right Ear: Hearing, tympanic membrane, external ear and ear canal normal.  Left Ear: Hearing, tympanic membrane, external ear and ear  canal normal.  Mouth/Throat: Uvula is midline, oropharynx is clear and moist and mucous membranes are normal. He does not have dentures.  Eyes: Conjunctivae, EOM and lids are normal. Pupils are equal, round, and reactive to light. No scleral icterus.  Neck: Trachea normal and normal range of motion. Neck supple. Carotid bruit is not present. No thyroid mass and no thyromegaly present.  Cardiovascular: Normal rate, regular rhythm, normal heart sounds and intact distal pulses.  Exam reveals no gallop and no friction rub.   No murmur heard. No carotid bruit b/l. No LE edema b/l. No calf TTP.   Pulmonary/Chest: Effort normal and breath sounds normal. He has no wheezes. He has no rhonchi. He has no rales. Right breast exhibits no inverted nipple, no mass, no nipple discharge, no skin change and no tenderness. Left breast exhibits no inverted nipple, no mass, no nipple discharge, no skin change and no tenderness. Breasts are symmetrical.  Abdominal: Soft. Normal appearance, normal aorta and bowel sounds are normal. He exhibits no pulsatile midline mass and no mass. There is no hepatosplenomegaly. There is no tenderness. There is no rigidity, no rebound and no guarding. No hernia.  Genitourinary: Rectal exam shows no external hemorrhoid, no mass and no tenderness. Prostate is enlarged (smooth, firm no palpable mass).  Musculoskeletal:  Normal range of motion. He exhibits edema.  Lymphadenopathy:       Head (right side): No posterior auricular adenopathy present.       Head (left side): No posterior auricular adenopathy present.    He has no cervical adenopathy.       Right: No supraclavicular adenopathy present.       Left: No supraclavicular adenopathy present.  Neurological: He is alert and oriented to person, place, and time. He has normal strength and normal reflexes. No cranial nerve deficit. Gait normal.  Skin: Skin is warm, dry and intact. No rash noted. Nails show no clubbing.  B/l dystrophic yellow thick toenails. No calluses or ulcerations  Psychiatric: He has a normal mood and affect. His speech is normal and behavior is normal. Thought content normal. Cognition and memory are normal.    Labs reviewed:  Basic Metabolic Panel: No results for input(s): NA, K, CL, CO2, GLUCOSE, BUN, CREATININE, CALCIUM, MG, PHOS, TSH in the last 8760 hours. Liver Function Tests: No results for input(s): AST, ALT, ALKPHOS, BILITOT, PROT, ALBUMIN in the last 8760 hours. No results for input(s): LIPASE, AMYLASE in the last 8760 hours. No results for input(s): AMMONIA in the last 8760 hours. CBC:  Recent Labs  02/16/16 1143  WBC 5.5  NEUTROABS 3,630  HGB 14.0  HCT 40.9  MCV 89.7  PLT 189   Lipid Panel: No results for input(s): CHOL, HDL, LDLCALC, TRIG, CHOLHDL, LDLDIRECT in the last 8760 hours. No results found for: HGBA1C  Procedures: No results found.   ECG OBTAINED AND REVIEWED BY MYSELF:  NSR @ 60 bpm, LAD, LAE. No acute ischemic changes. No change since 12/2012  Assessment/Plan   ICD-9-CM ICD-10-CM   1. Medicare annual wellness visit, initial V70.0 Z00.00 Lipid Panel  2. Essential hypertension 401.9 I10 EKG 12-Lead     CMP with eGFR     Lipid Panel     TSH     CBC with Differential/Platelets     Urinalysis with Reflex Microscopic     hydrochlorothiazide (HYDRODIURIL) 25 MG tablet  3. Bilateral edema of  lower extremity 782.3 R60.0 CMP with eGFR     TSH  hydrochlorothiazide (HYDRODIURIL) 25 MG tablet  4. BPH with elevated PSA 600.00 N40.0 PSA   790.93 R97.20   5. Encounter for screening for cardiovascular disorders V81.2 Z13.6 Lipid Panel  6. Onychomycosis of toenail 110.1 B35.1 Ambulatory referral to Podiatry    Pt is UTD on health maintenance. Vaccinations are UTD. Pt maintains a healthy lifestyle. Encouraged pt to exercise 30-45 minutes 4-5 times per week. Eat a well balanced diet. Avoid smoking. Limit alcohol intake. Wear seatbelt when riding in the car. Wear sun block (SPF >50) when spending extended times outside.  Continue current medications as ordered  cologuard for colon cancer screening  Will call with lab results  Will call with podiatry referral  Follow up in 6 mos for routine visit   Saatvik Thielman S. Perlie Gold  [facility] [address] [place], [place] [address] [phone] Cell (Monday-Friday 8 AM - 5 PM) [phone] After 5 PM and follow prompts

## 2016-02-16 NOTE — Patient Instructions (Signed)
Encouraged him to exercise 30-45 minutes 4-5 times per week. Eat a well balanced diet. Avoid smoking. Limit alcohol intake. Wear seatbelt when riding in the car. Wear sun block (SPF >50) when spending extended times outside.  Continue current medications as ordered  cologuard for colon cancer screening  Will call with lab results  Will call with podiatry referral  Follow up in 6 mos for routine visit

## 2016-02-17 LAB — URINALYSIS, ROUTINE W REFLEX MICROSCOPIC
Bilirubin Urine: NEGATIVE
Glucose, UA: NEGATIVE
Hgb urine dipstick: NEGATIVE
Ketones, ur: NEGATIVE
Leukocytes, UA: NEGATIVE
Nitrite: NEGATIVE
Specific Gravity, Urine: 1.013 (ref 1.001–1.035)
pH: 6 (ref 5.0–8.0)

## 2016-02-17 LAB — COMPLETE METABOLIC PANEL WITH GFR
ALT: 41 U/L (ref 9–46)
AST: 42 U/L — ABNORMAL HIGH (ref 10–35)
Albumin: 4 g/dL (ref 3.6–5.1)
Alkaline Phosphatase: 73 U/L (ref 40–115)
BUN: 13 mg/dL (ref 7–25)
CO2: 26 mmol/L (ref 20–31)
Calcium: 9.3 mg/dL (ref 8.6–10.3)
Chloride: 102 mmol/L (ref 98–110)
Creat: 0.88 mg/dL (ref 0.70–1.25)
GFR, Est African American: >89 mL/min (ref >=60)
GFR, Est Non African American: 88 mL/min (ref >=60)
Glucose, Bld: 88 mg/dL (ref 65–99)
Potassium: 3.5 mmol/L (ref 3.5–5.3)
Sodium: 142 mmol/L (ref 135–146)
Total Bilirubin: 1.1 mg/dL (ref 0.2–1.2)
Total Protein: 7.4 g/dL (ref 6.1–8.1)

## 2016-02-17 LAB — LIPID PANEL
Cholesterol: 167 mg/dL (ref 125–200)
HDL: 92 mg/dL (ref >=40)
LDL Cholesterol: 59 mg/dL (ref <130)
Total CHOL/HDL Ratio: 1.8 Ratio (ref <=5.0)
Triglycerides: 79 mg/dL (ref <150)
VLDL: 16 mg/dL (ref <30)

## 2016-02-17 LAB — PSA: PSA: 15.1 ng/mL — ABNORMAL HIGH (ref <=4.0)

## 2016-02-17 LAB — TSH: TSH: 1.06 mIU/L (ref 0.40–4.50)

## 2016-02-29 ENCOUNTER — Other Ambulatory Visit: Payer: Self-pay | Admitting: *Deleted

## 2016-02-29 DIAGNOSIS — I10 Essential (primary) hypertension: Secondary | ICD-10-CM

## 2016-02-29 DIAGNOSIS — R600 Localized edema: Secondary | ICD-10-CM

## 2016-02-29 MED ORDER — HYDROCHLOROTHIAZIDE 25 MG PO TABS: 25.0000 mg | ORAL_TABLET | Freq: Every day | ORAL | 5 refills | Status: DC

## 2016-02-29 NOTE — Telephone Encounter (Signed)
[facility] 

## 2016-03-01 ENCOUNTER — Ambulatory Visit: Payer: Medicare Other | Admitting: Podiatry

## 2016-06-26 ENCOUNTER — Telehealth: Payer: Self-pay | Admitting: *Deleted

## 2016-06-26 NOTE — Telephone Encounter (Signed)
Patient called angry and screaming in the phone stating Dr. Eulas Post had no right changing his medication. Stated that she should not change something without talking to him first. Stated that she changed his HCTZ to 25 mg and it was 50mg . I tried to tell him that Dr. Eulas Post did not change it that we hadn't seen him since 2014 and that was what was on his medication list currently. Patient very angry and cutting me off and not letting me explain. Patient kept screaming and then hung up on me.

## 2016-06-27 NOTE — Telephone Encounter (Signed)
Patient notified and started screaming in the phone that he was not coming in for an appointment before March. Stated you should never had changed his medication in the first place. I tried to explain to him that you did not change it and he started screaming in the phone that he was NOT coming back in and that you can see him in March. Stated you need to change his medication back and hung up on me.

## 2016-06-27 NOTE — Telephone Encounter (Signed)
Noted. I Rx the med and dose that was listed in his chart. Pt really needs to be seen before March appt due to abnormal labs in September. I am concerned that they could be worse especially with this angry outburst. Thanks

## 2016-06-27 NOTE — Telephone Encounter (Signed)
Will not be making any changes of his medication until seen in office. He may follow up as scheduled. Thank you, Rodena Piety

## 2016-06-27 NOTE — Telephone Encounter (Signed)
Tried calling patient. Voicemail is not set up and cannot leave message.

## 2016-06-27 NOTE — Telephone Encounter (Signed)
Patient notified. Told him what Dr. Eulas Post said and he hung up on me.

## 2016-06-30 ENCOUNTER — Encounter: Payer: Self-pay | Admitting: Internal Medicine

## 2016-08-16 ENCOUNTER — Ambulatory Visit: Payer: Medicare Other | Admitting: Internal Medicine

## 2016-10-18 ENCOUNTER — Ambulatory Visit: Payer: Medicare Other | Admitting: Internal Medicine

## 2017-02-20 ENCOUNTER — Other Ambulatory Visit: Payer: Self-pay | Admitting: Internal Medicine

## 2017-02-20 DIAGNOSIS — R600 Localized edema: Secondary | ICD-10-CM

## 2017-02-20 DIAGNOSIS — I10 Essential (primary) hypertension: Secondary | ICD-10-CM

## 2017-04-04 ENCOUNTER — Encounter (HOSPITAL_COMMUNITY): Payer: Self-pay

## 2017-04-04 ENCOUNTER — Emergency Department (HOSPITAL_COMMUNITY)
Admission: EM | Admit: 2017-04-04 | Discharge: 2017-04-04 | Disposition: A | Discharge status: Home / Self Care | Payer: Medicare Other | Attending: Emergency Medicine | Admitting: Emergency Medicine

## 2017-04-04 DIAGNOSIS — Z87891 Personal history of nicotine dependence: Secondary | ICD-10-CM | POA: Diagnosis not present

## 2017-04-04 DIAGNOSIS — I10 Essential (primary) hypertension: Secondary | ICD-10-CM | POA: Insufficient documentation

## 2017-04-04 DIAGNOSIS — R339 Retention of urine, unspecified: Secondary | ICD-10-CM | POA: Diagnosis not present

## 2017-04-04 DIAGNOSIS — Z79899 Other long term (current) drug therapy: Secondary | ICD-10-CM | POA: Insufficient documentation

## 2017-04-04 LAB — URINALYSIS, ROUTINE W REFLEX MICROSCOPIC
Bilirubin Urine: NEGATIVE
Glucose, UA: NEGATIVE mg/dL
Ketones, ur: NEGATIVE mg/dL
Leukocytes, UA: NEGATIVE
Nitrite: NEGATIVE
Protein, ur: NEGATIVE mg/dL
Specific Gravity, Urine: 1.008 (ref 1.005–1.030)
Squamous Epithelial / LPF: NONE SEEN
pH: 5 (ref 5.0–8.0)

## 2017-04-04 NOTE — Discharge Instructions (Signed)
You were seen today for urinary retention.  You need to follow-up closely with your urologist in 5-7 days for repeat evaluation and testing.  You will need to wear your Foley catheter until that time.

## 2017-04-04 NOTE — ED Triage Notes (Signed)
Pt states he's unable to urinate for three days, just dribbles He thinks it's related to his niacin that he's been taking

## 2017-04-04 NOTE — ED Notes (Signed)
Bed: WTR6 Expected date:  Expected time:  Means of arrival:  Comments: 

## 2017-04-04 NOTE — ED Provider Notes (Signed)
[facility] DEPT Provider Note   CSN: [id number] Arrival date & time: 04/04/17  0106     History   Chief Complaint Chief Complaint  Patient presents with  . Urinary Retention    HPI [name] [name]. is a 71 y.o. male.  HPI  This is a 71 year old male who presents with urinary retention.  Patient reports history of similar symptoms in the past.  He has previously seen urology and has had to have a Foley.  Patient reports 3 days of worsening urinary retention.  He states "I am just dribbling."  He states that he feels like "I have a basketball in my stomach."  Denies any fevers, back pain, chest pain, shortness of breath, nausea, vomiting.  Denies any hematuria.  He feels his symptoms may be related to taking niacin.  Past Medical History:  Diagnosis Date  . Blind   . Disorder of bone and cartilage, unspecified   . Edema   . Elevated prostate specific antigen (PSA)   . Hypertension   . Hypopotassemia   . Nonspecific abnormal results of liver function study   . Unspecified glaucoma(365.9)     Patient Active Problem List   Diagnosis Date Noted  . Hematuria 12/23/2012  . Chest pain 12/23/2012  . Anemia due to blood loss, acute 12/23/2012  . Elevated prostate specific antigen (PSA)   . Hypertension   . Edema     Past Surgical History:  Procedure Laterality Date  . EYE SURGERY Bilateral 2009   for glaucoma  . EYE SURGERY Left 2014  . HERNIA REPAIR Bilateral 2003   Dr. Jeraldine Loots  . LAPAROSCOPIC APPENDECTOMY N/A 12/24/2012   Procedure:  LAPAROSCOPIC APPENDECTOMY ;  Surgeon: Pedro Earls, MD;  Location: [facility];  Service: General;  Laterality: N/A;       Home Medications    Prior to Admission medications   Medication Sig Start Date End Date Taking? Authorizing Provider  brimonidine (ALPHAGAN P) 0.1 % SOLN Place 1 drop into both eyes 2 (two) times daily.     [provider]  cyanocobalamin 500 MCG tablet Take 500  mcg by mouth every morning.    [provider]  dorzolamide-timolol (COSOPT) 22.3-6.8 MG/ML ophthalmic solution APPOINTMENT OVERDUE, instill 1 drop in both eyes twice daily 07/14/13   [name], NP  folic acid (FOLVITE) 1 MG tablet Take 2 mg by mouth every morning.     [provider]  hydrochlorothiazide (HYDRODIURIL) 25 MG tablet TAKE 1 TABLET BY MOUTH EVERY DAY 02/20/17   Gildardo Cranker, DO  pyridOXINE (VITAMIN B-6) 100 MG tablet Take 100 mg by mouth every morning.    [provider]  vitamin C (ASCORBIC ACID) 500 MG tablet Take 500 mg by mouth every morning.    [provider]    Family History Family History  Problem Relation Age of Onset  . Kidney failure Mother     Social History Social History  Substance Use Topics  . Smoking status: Former Smoker    Types: Cigarettes  . Smokeless tobacco: Never Used  . Alcohol use No     Comment: STOPED IN 1970S     Allergies   Patient has no known allergies.   Review of Systems Review of Systems  Constitutional: Negative for fever.  Respiratory: Negative for shortness of breath.   Cardiovascular: Negative for chest pain.  Gastrointestinal: Positive for abdominal pain. Negative for nausea and vomiting.  Genitourinary: Positive for decreased urine  volume. Negative for dysuria and hematuria.  All other systems reviewed and are negative.    Physical Exam Updated Vital Signs BP (!) 187/102 (BP Location: Left Arm)   Pulse 72   Temp 99.3 F (37.4 C) (Oral)   Resp 20   SpO2 98%   Physical Exam  Constitutional: He is oriented to person, place, and time. He appears well-developed and well-nourished. No distress.  HENT:  Head: Normocephalic and atraumatic.  Cardiovascular: Normal rate, regular rhythm and normal heart sounds.   No murmur heard. Pulmonary/Chest: Effort normal and breath sounds normal. No respiratory distress. He has no wheezes.  Abdominal: Soft. Bowel sounds are normal.  There is tenderness. There is no rebound.  Lower abdominal and suprapubic tenderness to palpation with palpable bladder  Musculoskeletal: He exhibits edema.  Neurological: He is alert and oriented to person, place, and time.  Skin: Skin is warm and dry.  Chronic lower extremity venous stasis changes  Psychiatric: He has a normal mood and affect.  Nursing note and vitals reviewed.    ED Treatments / Results  Labs (all labs ordered are listed, but only abnormal results are displayed) Labs Reviewed  URINALYSIS, ROUTINE W REFLEX MICROSCOPIC - Abnormal; Notable for the following:       Result Value   Hgb urine dipstick LARGE (*)    Bacteria, UA RARE (*)    All other components within normal limits    EKG  EKG Interpretation None       Radiology No results found.  Procedures Procedures (including critical care time)  Medications Ordered in ED Medications - No data to display   Initial Impression / Assessment and Plan / ED Course  I have reviewed the triage vital signs and the nursing notes.  Pertinent labs & imaging results that were available during my care of the patient were reviewed by me and considered in my medical decision making (see chart for details).     Patient presents with acute urinary retention.  History of the same.  He is otherwise nontoxic appearing.  Initial vital signs notable for hypertension.  His bladder scan showed greater than thousand cc.  Foley catheter was placed and patient had complete resolution of his symptoms.  Urinalysis without evidence of infection.  On recheck, discussed with patient maintaining urinary catheter until he follows up with urology.  Recommend follow-up with Dr. Jasmine December.  Patient will be given a leg bag.  Foley catheter care instructions provided.  After history, exam, and medical workup I feel the patient has been appropriately medically screened and is safe for discharge home. Pertinent diagnoses were discussed with the  patient. Patient was given return precautions.   Final Clinical Impressions(s) / ED Diagnoses   Final diagnoses:  Urinary retention    New Prescriptions New Prescriptions   No medications on file     Merryl Hacker, MD 04/04/17 [phone]

## 2017-04-14 ENCOUNTER — Emergency Department (HOSPITAL_COMMUNITY): Payer: Medicare Other

## 2017-04-14 ENCOUNTER — Other Ambulatory Visit: Payer: Self-pay

## 2017-04-14 ENCOUNTER — Emergency Department (HOSPITAL_COMMUNITY)
Admission: EM | Admit: 2017-04-14 | Discharge: 2017-04-15 | Disposition: A | Discharge status: Home / Self Care | Payer: Medicare Other | Attending: Emergency Medicine | Admitting: Emergency Medicine

## 2017-04-14 ENCOUNTER — Encounter (HOSPITAL_COMMUNITY): Payer: Self-pay | Admitting: *Deleted

## 2017-04-14 DIAGNOSIS — D649 Anemia, unspecified: Secondary | ICD-10-CM | POA: Diagnosis not present

## 2017-04-14 DIAGNOSIS — N281 Cyst of kidney, acquired: Secondary | ICD-10-CM | POA: Diagnosis not present

## 2017-04-14 DIAGNOSIS — Z79899 Other long term (current) drug therapy: Secondary | ICD-10-CM | POA: Insufficient documentation

## 2017-04-14 DIAGNOSIS — R319 Hematuria, unspecified: Secondary | ICD-10-CM | POA: Diagnosis not present

## 2017-04-14 DIAGNOSIS — I10 Essential (primary) hypertension: Secondary | ICD-10-CM | POA: Insufficient documentation

## 2017-04-14 DIAGNOSIS — N3001 Acute cystitis with hematuria: Secondary | ICD-10-CM | POA: Insufficient documentation

## 2017-04-14 DIAGNOSIS — N3289 Other specified disorders of bladder: Secondary | ICD-10-CM | POA: Diagnosis not present

## 2017-04-14 LAB — COMPREHENSIVE METABOLIC PANEL
ALT: 35 U/L (ref 17–63)
AST: 47 U/L — ABNORMAL HIGH (ref 15–41)
Albumin: 3.5 g/dL (ref 3.5–5.0)
Alkaline Phosphatase: 54 U/L (ref 38–126)
Anion gap: 16 — ABNORMAL HIGH (ref 5–15)
BUN: 19 mg/dL (ref 6–20)
CO2: 22 mmol/L (ref 22–32)
Calcium: 8.6 mg/dL — ABNORMAL LOW (ref 8.9–10.3)
Chloride: 100 mmol/L — ABNORMAL LOW (ref 101–111)
Creatinine, Ser: 1.24 mg/dL (ref 0.61–1.24)
GFR calc Af Amer: >60 mL/min (ref >60)
GFR calc non Af Amer: 57 mL/min — ABNORMAL LOW (ref >60)
Glucose, Bld: 110 mg/dL — ABNORMAL HIGH (ref 65–99)
Potassium: 3 mmol/L — ABNORMAL LOW (ref 3.5–5.1)
Sodium: 138 mmol/L (ref 135–145)
Total Bilirubin: 1.1 mg/dL (ref 0.3–1.2)
Total Protein: 7 g/dL (ref 6.5–8.1)

## 2017-04-14 LAB — CBC
HCT: 28.6 % — ABNORMAL LOW (ref 39.0–52.0)
Hemoglobin: 9.8 g/dL — ABNORMAL LOW (ref 13.0–17.0)
MCH: 31.3 pg (ref 26.0–34.0)
MCHC: 34.3 g/dL (ref 30.0–36.0)
MCV: 91.4 fL (ref 78.0–100.0)
Platelets: 213 10*3/uL (ref 150–400)
RBC: 3.13 MIL/uL — ABNORMAL LOW (ref 4.22–5.81)
RDW: 12.9 % (ref 11.5–15.5)
WBC: 12 10*3/uL — ABNORMAL HIGH (ref 4.0–10.5)

## 2017-04-14 MED ORDER — CEPHALEXIN 500 MG PO CAPS: 500.0000 mg | ORAL_CAPSULE | Freq: Four times a day (QID) | ORAL | 0 refills | Status: DC

## 2017-04-14 MED ORDER — DEXTROSE 5 % IV SOLN
1.0000 g | Freq: Once | INTRAVENOUS | Status: AC
  Administered 2017-04-14: 1 g via INTRAVENOUS
  Filled 2017-04-14: qty 10

## 2017-04-14 MED ORDER — SODIUM CHLORIDE 0.9 % IV BOLUS (SEPSIS)
1000.0000 mL | Freq: Once | INTRAVENOUS | Status: AC
  Administered 2017-04-14: 1000 mL via INTRAVENOUS

## 2017-04-14 NOTE — Discharge Instructions (Signed)
Please read and follow all provided instructions.  Your diagnoses today include:  1. Hematuria, unspecified type   2. Acute hemorrhagic cystitis     Tests performed today include: Vital signs. See below for your results today.   Medications prescribed:  Take as prescribed   Home care instructions:  Follow any educational materials contained in this packet.  Follow-up instructions: Please follow-up with Urology for further evaluation of symptoms and treatment. Call on Monday for appointment  Return instructions:  Please return to the Emergency Department if you do not get better, if you get worse, or new symptoms OR  - Fever (temperature greater than 101.20F)  - Bleeding that does not stop with holding pressure to the area    -Severe pain (please note that you may be more sore the day after your accident)  - Chest Pain  - Difficulty breathing  - Severe nausea or vomiting  - Inability to tolerate food and liquids  - Passing out  - Skin becoming red around your wounds  - Change in mental status (confusion or lethargy)  - New numbness or weakness    Please return if you have any other emergent concerns.  Additional Information:  Your vital signs today were: BP 133/79    Pulse 72    Temp 98.2 F (36.8 C) (Oral)    Resp 18    SpO2 100%  If your blood pressure (BP) was elevated above 135/85 this visit, please have this repeated by your doctor within one month. ---------------

## 2017-04-14 NOTE — ED Notes (Signed)
Unable to send urine specimen for urinalysis---- urine very dark-red with lots of clots (bloody urine).

## 2017-04-14 NOTE — ED Provider Notes (Signed)
Medical screening examination/treatment/procedure(s) were conducted as a shared visit with non-physician practitioner(s) and myself.  I personally evaluated the patient during the encounter.   EKG Interpretation None     71 year old male here with 2-day history of hematuria.  No prior history of same.  On exam patient does have evidence of gross hematuria.  Will irrigate bladder and give urology referral   Lacretia Leigh, MD 04/14/17 2229

## 2017-04-14 NOTE — ED Triage Notes (Signed)
Pt reports he has blood in his urine x 1 days.  Pt also reports clots. Pt currently denies any pain.  Pt report that his bladder will periodically empty but there will be clots and pain will be there.  Pt a/o x 4.

## 2017-04-14 NOTE — ED Provider Notes (Signed)
[facility] DEPT Provider Note   CSN: [id number] Arrival date & time: 04/14/17  1543     History   Chief Complaint Chief Complaint  Patient presents with  . Hematuria    HPI [name] [name]. is a 71 y.o. male.  HPI  71 y.o. male with a hx of Blindness, HTN, presents to the Emergency Department today due to hematuria. Notes x 1 day. Notes that he currently has a foley in place from previous ED visit on 04-04-17 due to urinary retention. Pt has not follow up with Urology for this. Endorses passing clots in urine. Denies dysuria or any pain. No back pain. No fevers. No CP/SOB/ABD pain. No N/V. No chills or night sweats. Pt states that he still sees output from foley, but states it is all red now. No other symptoms noted.    Past Medical History:  Diagnosis Date  . Blind   . Disorder of bone and cartilage, unspecified   . Edema   . Elevated prostate specific antigen (PSA)   . Hypertension   . Hypopotassemia   . Nonspecific abnormal results of liver function study   . Unspecified glaucoma(365.9)     Patient Active Problem List   Diagnosis Date Noted  . Hematuria 12/23/2012  . Chest pain 12/23/2012  . Anemia due to blood loss, acute 12/23/2012  . Elevated prostate specific antigen (PSA)   . Hypertension   . Edema     Past Surgical History:  Procedure Laterality Date  . EYE SURGERY Bilateral 2009   for glaucoma  . EYE SURGERY Left 2014  . HERNIA REPAIR Bilateral 2003   Dr. Jeraldine Loots       Home Medications    Prior to Admission medications   Medication Sig Start Date End Date Taking? Authorizing Provider  brimonidine (ALPHAGAN P) 0.1 % SOLN Place 1 drop into both eyes 2 (two) times daily.     [provider]  cyanocobalamin 500 MCG tablet Take 500 mcg by mouth every morning.    [provider]  dorzolamide-timolol (COSOPT) 22.3-6.8 MG/ML ophthalmic solution APPOINTMENT OVERDUE, instill 1 drop in both eyes  twice daily 07/14/13   [name], NP  folic acid (FOLVITE) 1 MG tablet Take 2 mg by mouth every morning.     [provider]  hydrochlorothiazide (HYDRODIURIL) 25 MG tablet TAKE 1 TABLET BY MOUTH EVERY DAY 02/20/17   Gildardo Cranker, DO  pyridOXINE (VITAMIN B-6) 100 MG tablet Take 100 mg by mouth every morning.    [provider]  vitamin C (ASCORBIC ACID) 500 MG tablet Take 500 mg by mouth every morning.    [provider]    Family History Family History  Problem Relation Age of Onset  . Kidney failure Mother     Social History Social History   Tobacco Use  . Smoking status: Never Smoker  . Smokeless tobacco: Never Used  Substance Use Topics  . Alcohol use: No    Comment: STOPED IN 1970S  . Drug use: No     Allergies   Patient has no known allergies.   Review of Systems Review of Systems ROS reviewed and all are negative for acute change except as noted in the HPI.  Physical Exam Updated Vital Signs BP 102/85 (BP Location: Left Arm)   Pulse 93   Temp 98.2 F (36.8 C) (Oral)   Resp 18   SpO2 100%   Physical Exam  Constitutional: He is oriented to  person, place, and time. He appears well-developed and well-nourished. No distress.  HENT:  Head: Normocephalic and atraumatic.  Right Ear: Tympanic membrane, external ear and ear canal normal.  Left Ear: Tympanic membrane, external ear and ear canal normal.  Nose: Nose normal.  Mouth/Throat: Uvula is midline, oropharynx is clear and moist and mucous membranes are normal. No trismus in the jaw. No oropharyngeal exudate, posterior oropharyngeal erythema or tonsillar abscesses.  Eyes: EOM are normal. Pupils are equal, round, and reactive to light.  Neck: Normal range of motion. Neck supple. No tracheal deviation present.  Cardiovascular: Normal rate, regular rhythm, S1 normal, S2 normal, normal heart sounds, intact distal pulses and normal pulses.  Pulmonary/Chest: Effort normal and breath  sounds normal. No respiratory distress. He has no decreased breath sounds. He has no wheezes. He has no rhonchi. He has no rales.  Abdominal: Normal appearance and bowel sounds are normal. There is no tenderness.  Genitourinary:  Genitourinary Comments: Chaperone present. No blood around meatus. Foley in place. Blood noted in foley bag. Large clots noted   Musculoskeletal: Normal range of motion.  Neurological: He is alert and oriented to person, place, and time.  Skin: Skin is warm and dry.  Psychiatric: He has a normal mood and affect. His speech is normal and behavior is normal. Thought content normal.  Nursing note and vitals reviewed.  ED Treatments / Results  Labs (all labs ordered are listed, but only abnormal results are displayed) Labs Reviewed  CBC - Abnormal; Notable for the following components:      Result Value   WBC 12.0 (*)    RBC 3.13 (*)    Hemoglobin 9.8 (*)    HCT 28.6 (*)    All other components within normal limits  COMPREHENSIVE METABOLIC PANEL - Abnormal; Notable for the following components:   Potassium 3.0 (*)    Chloride 100 (*)    Glucose, Bld 110 (*)    Calcium 8.6 (*)    AST 47 (*)    GFR calc non Af Amer 57 (*)    Anion gap 16 (*)    All other components within normal limits  URINALYSIS, ROUTINE W REFLEX MICROSCOPIC    EKG  EKG Interpretation None       Radiology Ct Renal Stone Study  Result Date: 04/14/2017 CLINICAL DATA:  71 y/o  M; hematuria. EXAM: CT ABDOMEN AND PELVIS WITHOUT CONTRAST TECHNIQUE: Multidetector CT imaging of the abdomen and pelvis was performed following the standard protocol without IV contrast. COMPARISON:  12/23/2012 CT abdomen and pelvis. FINDINGS: Lower chest: No acute abnormality. Hepatobiliary: Stable subcentimeter right lobe of liver cyst. No other focal liver lesion. Normal gallbladder. No biliary ductal dilatation. Pancreas: Unremarkable. No pancreatic ductal dilatation or surrounding inflammatory changes.  Spleen: Normal in size without focal abnormality. Adrenals/Urinary Tract: Normal adrenal glands. Multiple renal cysts measuring up to 5.1 cm in lower pole of right kidney. No hydronephrosis.Foley catheter in situ. Bladder air-fluid level. Fluid in the bladder is high in attenuation compatible with hemorrhage. Stomach/Bowel: Stomach is within normal limits. Appendix resected. No evidence of bowel wall thickening, distention, or inflammatory changes. Vascular/Lymphatic: Aortic atherosclerosis. No enlarged abdominal or pelvic lymph nodes. Reproductive: Massive prostate enlargement measuring up to 13.2 cm in sagittal plane, previously 12.1 cm. Other: Small left inguinal hernia containing fat is stable. Musculoskeletal: Mild osteoarthrosis of hip joints and moderate spondylosis of the lumbar spine. IMPRESSION: 1. Large volume of hemorrhage within the bladder. 2. Progressed massive prostate enlargement from 2014.  3. Liver and kidney cysts. 4. Mild aortic atherosclerosis. Electronically Signed   By: Kristine Garbe M.D.   On: 04/14/2017 20:54    Procedures Procedures (including critical care time)  Medications Ordered in ED Medications  cefTRIAXone (ROCEPHIN) 1 g in dextrose 5 % 50 mL IVPB (not administered)  sodium chloride 0.9 % bolus 1,000 mL (0 mLs Intravenous Stopped 04/14/17 2202)     Initial Impression / Assessment and Plan / ED Course  I have reviewed the triage vital signs and the nursing notes.  Pertinent labs & imaging results that were available during my care of the patient were reviewed by me and considered in my medical decision making (see chart for details).  Final Clinical Impressions(s) / ED Diagnoses  {I have reviewed and evaluated the relevant laboratory values. {I have reviewed and evaluated the relevant imaging studies.  {I have reviewed the relevant previous healthcare records.  {I obtained HPI from historian. {Patient discussed with supervising physician.  ED  Course:  Assessment: Pt is a 71 y.o. male with a hx of Blindness, HTN, presents to the Emergency Department today due to hematuria. Notes x 1 day. Notes that he currently has a foley in place from previous ED visit on 04-04-17 due to urinary retention. Pt has not follow up with Urology for this. Endorses passing clots in urine. Denies dysuria or any pain. No back pain. No fevers. No CP/SOB/ABD pain. No N/V. No chills or night sweats. Pt states that he still sees output from foley, but states it is all red now. On exam, pt in NAD. Nontoxic/nonseptic appearing. VSS. Afebrile. Lungs CTA. Heart RRR. Abdomen nontender soft. No blood around meatus. Foley in place. Blood noted in foley bag. Large clots noted. CBC with stable Hgb. UA unable to read due to blood. Irrigate foley in ED with 2L with minimal effect on hematuria. CT Abdomen shows large hemorrhage in bladder. Replaced foley with three way for continuous irrigation. Given IV Rocephin for suspect infection. Consult to Urology Alyson Ingles). Will start on ABX for suspect acute cystitis as cause of symptoms. Close follow up with Urology discussed. Anticipate DC home after irrigation.   Disposition/Plan:  DC Home Follow up with Urology  Strict Return precautions given Pt acknowledges and agrees with plan  Supervising Physician Lacretia Leigh, MD  Final diagnoses:  Hematuria, unspecified type  Acute hemorrhagic cystitis    ED Discharge Orders    None       Shary Decamp, PA-C 04/14/17 2242

## 2017-04-15 LAB — URINALYSIS, ROUTINE W REFLEX MICROSCOPIC

## 2017-04-15 LAB — URINALYSIS, MICROSCOPIC (REFLEX): Bacteria, UA: NONE SEEN

## 2017-04-15 MED ORDER — POTASSIUM CHLORIDE CRYS ER 20 MEQ PO TBCR
40.0000 meq | EXTENDED_RELEASE_TABLET | Freq: Once | ORAL | Status: AC
  Administered 2017-04-15: 40 meq via ORAL
  Filled 2017-04-15: qty 2

## 2017-04-15 NOTE — ED Provider Notes (Signed)
Care assumed from [facility], PA-C.  Please see his full H&P.  [name] [name]. is a 71 y.o. male presents with hematuria x2 days.  Patient does not have a history of same.  He does currently have a Foley catheter in place from previous ED visit on 04/04/2017 due to urinary retention.  He has not yet followed up with urology for this.  He endorses blood clots in his urine.  Physical Exam  BP 123/88   Pulse 75   Temp 98.2 F (36.8 C) (Oral)   Resp 18   SpO2 98%   Physical Exam  Constitutional: He appears well-developed and well-nourished. No distress.  HENT:  Head: Normocephalic.  Eyes: Conjunctivae are normal. No scleral icterus.  Neck: Normal range of motion.  Cardiovascular: Normal rate and intact distal pulses.  Pulmonary/Chest: Effort normal.  Musculoskeletal: Normal range of motion.  Neurological: He is alert.  Skin: Skin is warm and dry.  Nursing note and vitals reviewed.   ED Course  Procedures  Clinical Course as of Apr 15 5  Sat Apr 14, 2017  2300 Plan: Will insert three-way Foley catheter and irrigate bladder.  She has been given Rocephin.  Will discharge home with Keflex and urology follow-up.  [HM]  Sun Apr 15, 2017  0003 Urine is clearing and is now light pink.  581mL remaining on his irrigation  [HM]    Clinical Course User Index [HM] Angellee Cohill, Gwenlyn Perking    MDM Patient with hematuria and hemorrhagic cystitis.  Given Rocephin here in the emergency department.  Bladder was irrigated with moderate improvement in urine color.  Some hematuria is persistent.  Patient will need close follow-up with urology.  Patient states understanding and reports he will pursue this as directed.  Discussed reasons to return to the emergency department including worsening blood.  He states understanding.  Hematuria, unspecified type  Acute hemorrhagic cystitis       Abigail Butts, PA-C 04/15/17 0308

## 2017-04-16 LAB — URINE CULTURE: Culture: NO GROWTH

## 2017-04-18 ENCOUNTER — Other Ambulatory Visit: Payer: Self-pay | Admitting: Internal Medicine

## 2017-04-18 DIAGNOSIS — R600 Localized edema: Secondary | ICD-10-CM

## 2017-04-18 DIAGNOSIS — I10 Essential (primary) hypertension: Secondary | ICD-10-CM

## 2017-05-16 ENCOUNTER — Other Ambulatory Visit: Payer: Self-pay | Admitting: Internal Medicine

## 2017-05-16 DIAGNOSIS — R600 Localized edema: Secondary | ICD-10-CM

## 2017-05-16 DIAGNOSIS — I10 Essential (primary) hypertension: Secondary | ICD-10-CM

## 2017-12-26 ENCOUNTER — Other Ambulatory Visit: Payer: Self-pay | Admitting: Internal Medicine

## 2017-12-26 DIAGNOSIS — R600 Localized edema: Secondary | ICD-10-CM

## 2017-12-26 DIAGNOSIS — I10 Essential (primary) hypertension: Secondary | ICD-10-CM

## 2017-12-26 NOTE — Telephone Encounter (Signed)
Patient has not been seen since 2017 by Dr. Eulas Post, he has been called and still will not schedule an appointment. Please advise for his refill.

## 2017-12-26 NOTE — Telephone Encounter (Signed)
Refill denied until patient makes appt

## 2018-01-13 DIAGNOSIS — R310 Gross hematuria: Secondary | ICD-10-CM | POA: Diagnosis not present

## 2018-01-13 DIAGNOSIS — I878 Other specified disorders of veins: Secondary | ICD-10-CM | POA: Diagnosis present

## 2018-01-13 DIAGNOSIS — N401 Enlarged prostate with lower urinary tract symptoms: Secondary | ICD-10-CM | POA: Diagnosis present

## 2018-01-13 DIAGNOSIS — I10 Essential (primary) hypertension: Secondary | ICD-10-CM | POA: Diagnosis not present

## 2018-01-13 DIAGNOSIS — H548 Legal blindness, as defined in USA: Secondary | ICD-10-CM | POA: Diagnosis not present

## 2018-01-13 DIAGNOSIS — E8770 Fluid overload, unspecified: Secondary | ICD-10-CM | POA: Diagnosis present

## 2018-01-13 DIAGNOSIS — D62 Acute posthemorrhagic anemia: Secondary | ICD-10-CM | POA: Diagnosis not present

## 2018-01-13 DIAGNOSIS — D500 Iron deficiency anemia secondary to blood loss (chronic): Secondary | ICD-10-CM | POA: Diagnosis not present

## 2018-01-13 DIAGNOSIS — H409 Unspecified glaucoma: Secondary | ICD-10-CM | POA: Diagnosis present

## 2018-01-13 DIAGNOSIS — N320 Bladder-neck obstruction: Secondary | ICD-10-CM | POA: Diagnosis present

## 2018-01-13 DIAGNOSIS — K4090 Unilateral inguinal hernia, without obstruction or gangrene, not specified as recurrent: Secondary | ICD-10-CM | POA: Diagnosis present

## 2018-01-13 DIAGNOSIS — N029 Recurrent and persistent hematuria with unspecified morphologic changes: Secondary | ICD-10-CM | POA: Diagnosis not present

## 2018-01-13 DIAGNOSIS — F419 Anxiety disorder, unspecified: Secondary | ICD-10-CM | POA: Diagnosis present

## 2018-01-13 DIAGNOSIS — E876 Hypokalemia: Secondary | ICD-10-CM | POA: Diagnosis present

## 2018-01-13 DIAGNOSIS — Z79899 Other long term (current) drug therapy: Secondary | ICD-10-CM

## 2018-01-13 NOTE — ED Triage Notes (Addendum)
Pt to ED with c/o of rose colored urine. Pt states that he is also urinating clots in his urine as well. Pt denies any pain.

## 2018-01-14 ENCOUNTER — Encounter (HOSPITAL_COMMUNITY): Payer: Self-pay | Admitting: Internal Medicine

## 2018-01-14 ENCOUNTER — Inpatient Hospital Stay (HOSPITAL_COMMUNITY)
Admission: EM | Admit: 2018-01-14 | Discharge: 2018-01-18 | DRG: 669 | Disposition: A | Discharge status: Home / Self Care | Payer: Medicare Other | Attending: Internal Medicine | Admitting: Internal Medicine

## 2018-01-14 ENCOUNTER — Inpatient Hospital Stay (HOSPITAL_COMMUNITY): Payer: Medicare Other

## 2018-01-14 ENCOUNTER — Other Ambulatory Visit: Payer: Self-pay

## 2018-01-14 DIAGNOSIS — R310 Gross hematuria: Secondary | ICD-10-CM

## 2018-01-14 DIAGNOSIS — N401 Enlarged prostate with lower urinary tract symptoms: Secondary | ICD-10-CM | POA: Diagnosis not present

## 2018-01-14 DIAGNOSIS — R319 Hematuria, unspecified: Secondary | ICD-10-CM | POA: Diagnosis present

## 2018-01-14 DIAGNOSIS — H409 Unspecified glaucoma: Secondary | ICD-10-CM | POA: Diagnosis not present

## 2018-01-14 DIAGNOSIS — E876 Hypokalemia: Secondary | ICD-10-CM | POA: Diagnosis not present

## 2018-01-14 DIAGNOSIS — N342 Other urethritis: Secondary | ICD-10-CM | POA: Diagnosis not present

## 2018-01-14 DIAGNOSIS — N3289 Other specified disorders of bladder: Secondary | ICD-10-CM | POA: Diagnosis not present

## 2018-01-14 DIAGNOSIS — H548 Legal blindness, as defined in USA: Secondary | ICD-10-CM | POA: Diagnosis present

## 2018-01-14 DIAGNOSIS — D494 Neoplasm of unspecified behavior of bladder: Secondary | ICD-10-CM | POA: Diagnosis not present

## 2018-01-14 DIAGNOSIS — Z79899 Other long term (current) drug therapy: Secondary | ICD-10-CM | POA: Diagnosis not present

## 2018-01-14 DIAGNOSIS — R972 Elevated prostate specific antigen [PSA]: Secondary | ICD-10-CM | POA: Diagnosis not present

## 2018-01-14 DIAGNOSIS — N281 Cyst of kidney, acquired: Secondary | ICD-10-CM | POA: Diagnosis not present

## 2018-01-14 DIAGNOSIS — D72829 Elevated white blood cell count, unspecified: Secondary | ICD-10-CM | POA: Diagnosis not present

## 2018-01-14 DIAGNOSIS — N320 Bladder-neck obstruction: Secondary | ICD-10-CM | POA: Diagnosis not present

## 2018-01-14 DIAGNOSIS — I10 Essential (primary) hypertension: Secondary | ICD-10-CM

## 2018-01-14 DIAGNOSIS — F419 Anxiety disorder, unspecified: Secondary | ICD-10-CM | POA: Diagnosis present

## 2018-01-14 DIAGNOSIS — I878 Other specified disorders of veins: Secondary | ICD-10-CM | POA: Diagnosis not present

## 2018-01-14 DIAGNOSIS — K4090 Unilateral inguinal hernia, without obstruction or gangrene, not specified as recurrent: Secondary | ICD-10-CM | POA: Diagnosis not present

## 2018-01-14 DIAGNOSIS — D62 Acute posthemorrhagic anemia: Secondary | ICD-10-CM

## 2018-01-14 DIAGNOSIS — N029 Recurrent and persistent hematuria with unspecified morphologic changes: Secondary | ICD-10-CM | POA: Diagnosis not present

## 2018-01-14 DIAGNOSIS — D500 Iron deficiency anemia secondary to blood loss (chronic): Secondary | ICD-10-CM | POA: Diagnosis not present

## 2018-01-14 DIAGNOSIS — E8770 Fluid overload, unspecified: Secondary | ICD-10-CM | POA: Diagnosis not present

## 2018-01-14 LAB — CBC WITH DIFFERENTIAL/PLATELET
Basophils Absolute: 0.1 10*3/uL (ref 0.0–0.1)
Basophils Relative: 1 %
Eosinophils Absolute: 0 10*3/uL (ref 0.0–0.7)
Eosinophils Relative: 0 %
HCT: 12.5 % — ABNORMAL LOW (ref 39.0–52.0)
Hemoglobin: 3.8 g/dL — CL (ref 13.0–17.0)
Lymphocytes Relative: 31 %
Lymphs Abs: 1.7 10*3/uL (ref 0.7–4.0)
MCH: 22.2 pg — ABNORMAL LOW (ref 26.0–34.0)
MCHC: 30.4 g/dL (ref 30.0–36.0)
MCV: 73.1 fL — ABNORMAL LOW (ref 78.0–100.0)
Monocytes Absolute: 0.5 10*3/uL (ref 0.1–1.0)
Monocytes Relative: 9 %
Neutro Abs: 3.2 10*3/uL (ref 1.7–7.7)
Neutrophils Relative %: 59 %
Platelets: 261 10*3/uL (ref 150–400)
RBC: 1.71 MIL/uL — ABNORMAL LOW (ref 4.22–5.81)
RDW: 21.7 % — ABNORMAL HIGH (ref 11.5–15.5)
WBC: 5.4 10*3/uL (ref 4.0–10.5)

## 2018-01-14 LAB — URINALYSIS, ROUTINE W REFLEX MICROSCOPIC

## 2018-01-14 LAB — PREPARE RBC (CROSSMATCH)

## 2018-01-14 LAB — BASIC METABOLIC PANEL
Anion gap: 10 (ref 5–15)
BUN: 14 mg/dL (ref 8–23)
CO2: 24 mmol/L (ref 22–32)
Calcium: 8.3 mg/dL — ABNORMAL LOW (ref 8.9–10.3)
Chloride: 103 mmol/L (ref 98–111)
Creatinine, Ser: 1.07 mg/dL (ref 0.61–1.24)
GFR calc Af Amer: >60 mL/min (ref >60)
GFR calc non Af Amer: >60 mL/min (ref >60)
Glucose, Bld: 105 mg/dL — ABNORMAL HIGH (ref 70–99)
Potassium: 2.7 mmol/L — CL (ref 3.5–5.1)
Sodium: 137 mmol/L (ref 135–145)

## 2018-01-14 LAB — URINALYSIS, MICROSCOPIC (REFLEX)
RBC / HPF: >50 RBC/hpf (ref 0–5)
Squamous Epithelial / LPF: NONE SEEN (ref 0–5)

## 2018-01-14 LAB — HEMOGLOBIN AND HEMATOCRIT, BLOOD
HCT: 21.7 % — ABNORMAL LOW (ref 39.0–52.0)
Hemoglobin: 6.9 g/dL — CL (ref 13.0–17.0)

## 2018-01-14 LAB — PSA: Prostatic Specific Antigen: 38.32 ng/mL — ABNORMAL HIGH (ref 0.00–4.00)

## 2018-01-14 LAB — POTASSIUM: Potassium: 3 mmol/L — ABNORMAL LOW (ref 3.5–5.1)

## 2018-01-14 MED ORDER — ACETAMINOPHEN 650 MG RE SUPP: 650.0000 mg | Freq: Four times a day (QID) | RECTAL | Status: DC | PRN

## 2018-01-14 MED ORDER — POTASSIUM CHLORIDE CRYS ER 20 MEQ PO TBCR
40.0000 meq | EXTENDED_RELEASE_TABLET | Freq: Once | ORAL | Status: AC
Start: 2018-01-14 — End: 2018-01-14
  Administered 2018-01-14: 40 meq via ORAL
  Filled 2018-01-14: qty 2

## 2018-01-14 MED ORDER — VITAMIN B-6 100 MG PO TABS
100.0000 mg | ORAL_TABLET | Freq: Every morning | ORAL | Status: DC
  Administered 2018-01-14 – 2018-01-18 (×4): 100 mg via ORAL
  Filled 2018-01-14 (×5): qty 1

## 2018-01-14 MED ORDER — PILOCARPINE HCL 2 % OP SOLN
1.0000 [drp] | Freq: Four times a day (QID) | OPHTHALMIC | Status: DC
  Administered 2018-01-14 – 2018-01-18 (×16): 1 [drp] via OPHTHALMIC
  Filled 2018-01-14: qty 15

## 2018-01-14 MED ORDER — VITAMIN C 500 MG PO TABS
2000.0000 mg | ORAL_TABLET | Freq: Every morning | ORAL | Status: DC
  Administered 2018-01-14 – 2018-01-18 (×4): 2000 mg via ORAL
  Filled 2018-01-14 (×5): qty 4

## 2018-01-14 MED ORDER — SODIUM CHLORIDE 0.9% IV SOLUTION
Freq: Once | INTRAVENOUS | Status: AC
  Administered 2018-01-16: 13:00:00 via INTRAVENOUS

## 2018-01-14 MED ORDER — DORZOLAMIDE HCL-TIMOLOL MAL 2-0.5 % OP SOLN
1.0000 [drp] | Freq: Two times a day (BID) | OPHTHALMIC | Status: DC
  Administered 2018-01-14 – 2018-01-18 (×8): 1 [drp] via OPHTHALMIC
  Filled 2018-01-14: qty 10

## 2018-01-14 MED ORDER — MAGNESIUM OXIDE 400 (241.3 MG) MG PO TABS
200.0000 mg | ORAL_TABLET | Freq: Every day | ORAL | Status: DC
  Administered 2018-01-14 – 2018-01-18 (×4): 200 mg via ORAL
  Filled 2018-01-14 (×4): qty 1

## 2018-01-14 MED ORDER — HYDROCHLOROTHIAZIDE 25 MG PO TABS
25.0000 mg | ORAL_TABLET | Freq: Every day | ORAL | Status: DC
  Administered 2018-01-14 – 2018-01-18 (×4): 25 mg via ORAL
  Filled 2018-01-14 (×4): qty 1

## 2018-01-14 MED ORDER — ONDANSETRON HCL 4 MG PO TABS: 4.0000 mg | ORAL_TABLET | Freq: Four times a day (QID) | ORAL | Status: DC | PRN

## 2018-01-14 MED ORDER — ACETAMINOPHEN 325 MG PO TABS: 650.0000 mg | ORAL_TABLET | Freq: Four times a day (QID) | ORAL | Status: DC | PRN

## 2018-01-14 MED ORDER — POTASSIUM CHLORIDE CRYS ER 20 MEQ PO TBCR
40.0000 meq | EXTENDED_RELEASE_TABLET | Freq: Once | ORAL | Status: AC
  Administered 2018-01-14: 40 meq via ORAL
  Filled 2018-01-14: qty 2

## 2018-01-14 MED ORDER — FINASTERIDE 5 MG PO TABS
5.0000 mg | ORAL_TABLET | Freq: Every day | ORAL | Status: DC
  Administered 2018-01-14 – 2018-01-18 (×4): 5 mg via ORAL
  Filled 2018-01-14 (×4): qty 1

## 2018-01-14 MED ORDER — FUROSEMIDE 10 MG/ML IJ SOLN
20.0000 mg | Freq: Once | INTRAMUSCULAR | Status: AC
  Administered 2018-01-14: 20 mg via INTRAVENOUS
  Filled 2018-01-14: qty 2

## 2018-01-14 MED ORDER — POTASSIUM CHLORIDE 10 MEQ/100ML IV SOLN
10.0000 meq | INTRAVENOUS | Status: AC
  Administered 2018-01-14 (×2): 10 meq via INTRAVENOUS
  Filled 2018-01-14 (×2): qty 100

## 2018-01-14 MED ORDER — VITAMIN B-12 1000 MCG PO TABS
1000.0000 ug | ORAL_TABLET | Freq: Every morning | ORAL | Status: DC
Start: 2018-01-14 — End: 2018-01-18
  Administered 2018-01-14 – 2018-01-18 (×4): 1000 ug via ORAL
  Filled 2018-01-14 (×4): qty 1

## 2018-01-14 MED ORDER — ONDANSETRON HCL 4 MG/2ML IJ SOLN: 4.0000 mg | Freq: Four times a day (QID) | INTRAMUSCULAR | Status: DC | PRN

## 2018-01-14 MED ORDER — SODIUM CHLORIDE 0.9% IV SOLUTION
Freq: Once | INTRAVENOUS | Status: AC
  Administered 2018-01-14: 17:00:00 via INTRAVENOUS

## 2018-01-14 MED ORDER — BRIMONIDINE TARTRATE 0.15 % OP SOLN
1.0000 [drp] | Freq: Two times a day (BID) | OPHTHALMIC | Status: DC
  Administered 2018-01-14 – 2018-01-18 (×8): 1 [drp] via OPHTHALMIC
  Filled 2018-01-14: qty 5

## 2018-01-14 MED ORDER — FOLIC ACID 1 MG PO TABS
500.0000 ug | ORAL_TABLET | Freq: Every day | ORAL | Status: DC
  Administered 2018-01-14 – 2018-01-18 (×4): 0.5 mg via ORAL
  Filled 2018-01-14 (×4): qty 1

## 2018-01-14 NOTE — ED Provider Notes (Addendum)
[facility] DEPT Provider Note: Georgena Spurling, MD, FACEP  CSN: [id number] MRN: [id number] ARRIVAL: 01/13/18 at 2345 ROOM: WA10/WA10   CHIEF COMPLAINT  Hematuria   HISTORY OF PRESENT ILLNESS  01/14/18 12:07 AM [name] [name]. is a 72 y.o. male who is legally blind.  He states he has had intermittent hematuria since 2014.  These episodes come and go.  They typically consist of rose to burgundy colored urine and sometimes with the passage of clots.  When he has heavy clots he sometimes gets obstructed but denies pain at the present time.  The current episode is been present since July 19.  He is here tonight because he is concerned he may have been losing too much blood.  He was weak at home earlier but states he feels at his baseline now.  He has chronic edema of the ankles and feet.   Past Medical History:  Diagnosis Date  . Blind   . Disorder of bone and cartilage, unspecified   . Edema   . Elevated prostate specific antigen (PSA)   . Hypertension   . Hypopotassemia   . Nonspecific abnormal results of liver function study   . Unspecified glaucoma(365.9)     Past Surgical History:  Procedure Laterality Date  . EYE SURGERY Bilateral 2009   for glaucoma  . EYE SURGERY Left 2014  . HERNIA REPAIR Bilateral 2003   Dr. Jeraldine Loots  . LAPAROSCOPIC APPENDECTOMY N/A 12/24/2012   Procedure:  LAPAROSCOPIC APPENDECTOMY ;  Surgeon: Pedro Earls, MD;  Location: [facility];  Service: General;  Laterality: N/A;    Family History  Problem Relation Age of Onset  . Kidney failure Mother     Social History   Tobacco Use  . Smoking status: Never Smoker  . Smokeless tobacco: Never Used  Substance Use Topics  . Alcohol use: No    Comment: STOPED IN 1970S  . Drug use: No    Prior to Admission medications   Medication Sig Start Date End Date Taking? Authorizing Provider  brimonidine (ALPHAGAN P) 0.1 % SOLN Place 1 drop into both eyes 2 (two) times daily.     [provider]  cephALEXin (KEFLEX) 500 MG capsule Take 1 capsule (500 mg total) 4 (four) times daily by mouth. 04/14/17   Shary Decamp, PA-C  cyanocobalamin 500 MCG tablet Take 500 mcg by mouth every morning.    [provider]  dorzolamide-timolol (COSOPT) 22.3-6.8 MG/ML ophthalmic solution APPOINTMENT OVERDUE, instill 1 drop in both eyes twice daily 07/14/13   [name], NP  folic acid (FOLVITE) 1 MG tablet Take 2 mg by mouth every morning.     [provider]  hydrochlorothiazide (HYDRODIURIL) 25 MG tablet TAKE 1 TABLET BY MOUTH EVERY DAY 04/18/17   Gildardo Cranker, DO  OVER THE COUNTER MEDICATION Take 5 mLs daily by mouth.     [provider]  pyridOXINE (VITAMIN B-6) 100 MG tablet Take 100 mg by mouth every morning.    [provider]  vitamin C (ASCORBIC ACID) 500 MG tablet Take 500 mg by mouth every morning.    [provider]    Allergies Patient has no known allergies.   REVIEW OF SYSTEMS  Negative except as noted here or in the History of Present Illness.   PHYSICAL EXAMINATION  Initial Vital Signs Blood pressure 130/72, pulse 84, temperature 97.8 F (36.6 C), temperature source Oral, resp. rate 15, height 5\' 8"  (1.727 m), weight 86.2 kg, SpO2  95 %.  Examination General: Well-developed, well-nourished male in no acute distress; appearance consistent with age of record HENT: normocephalic; atraumatic Eyes: Anisocoria, pupils sluggish; extraocular muscles grossly intact; poor visual acuity, cannot count fingers; conjunctival pallor Neck: supple Heart: regular rate and rhythm Lungs: clear to auscultation bilaterally Abdomen: soft; nondistended; nontender; bowel sounds present Extremities: No deformity; full range of motion; pulses normal; edema of ankles and feet, right greater than left Neurologic: Awake, alert and oriented; motor function intact in all extremities and symmetric; no facial droop Skin: Warm and dry;  pale Psychiatric: Normal mood and affect   RESULTS  Summary of this visit's results, reviewed by myself:   EKG Interpretation  Date/Time:  Monday January 14 2018 01:47:34 EDT Ventricular Rate:  70 PR Interval:    QRS Duration: 102 QT Interval:  412 QTC Calculation: 445 R Axis:   50 Text Interpretation:  Sinus rhythm Normal ECG No significant change was found Confirmed by Kerington Hildebrant, Jenny Reichmann [phone]) on 01/14/2018 2:26:19 AM      Laboratory Studies: Results for orders placed or performed during the hospital encounter of 01/14/18 (from the past 24 hour(s))  CBC with Differential/Platelet     Status: Abnormal   Collection Time: 01/14/18 12:18 AM  Result Value Ref Range   WBC 5.4 4.0 - 10.5 K/uL   RBC 1.71 (L) 4.22 - 5.81 MIL/uL   Hemoglobin 3.8 (LL) 13.0 - 17.0 g/dL   HCT 12.5 (L) 39.0 - 52.0 %   MCV 73.1 (L) 78.0 - 100.0 fL   MCH 22.2 (L) 26.0 - 34.0 pg   MCHC 30.4 30.0 - 36.0 g/dL   RDW 21.7 (H) 11.5 - 15.5 %   Platelets 261 150 - 400 K/uL   Neutrophils Relative % 59 %   Neutro Abs 3.2 1.7 - 7.7 K/uL   Lymphocytes Relative 31 %   Lymphs Abs 1.7 0.7 - 4.0 K/uL   Monocytes Relative 9 %   Monocytes Absolute 0.5 0.1 - 1.0 K/uL   Eosinophils Relative 0 %   Eosinophils Absolute 0.0 0.0 - 0.7 K/uL   Basophils Relative 1 %   Basophils Absolute 0.1 0.0 - 0.1 K/uL   RBC Morphology POLYCHROMASIA PRESENT   Basic metabolic panel     Status: Abnormal   Collection Time: 01/14/18 12:18 AM  Result Value Ref Range   Sodium 137 135 - 145 mmol/L   Potassium 2.7 (LL) 3.5 - 5.1 mmol/L   Chloride 103 98 - 111 mmol/L   CO2 24 22 - 32 mmol/L   Glucose, Bld 105 (H) 70 - 99 mg/dL   BUN 14 8 - 23 mg/dL   Creatinine, Ser 1.07 0.61 - 1.24 mg/dL   Calcium 8.3 (L) 8.9 - 10.3 mg/dL   GFR calc non Af Amer >60 >60 mL/min   GFR calc Af Amer >60 >60 mL/min   Anion gap 10 5 - 15  Type and screen [place]     Status: None   Collection Time: 01/14/18  1:00 AM  Result Value Ref Range    ABO/RH(D) B POS    Antibody Screen NEG    Sample Expiration      01/17/2018 Performed at [facility], [place] [address], [place], [place] [address]    Imaging Studies: No results found.  ED COURSE and MDM  Nursing notes and initial vitals signs, including pulse oximetry, reviewed.  Vitals:   01/13/18 2356 01/14/18 0230  BP: 130/72 (!) 146/76  Pulse: 84 84  Resp: 15 19  Temp: 97.8 F (36.6 C)   TempSrc: Oral   SpO2: 95% 95%  Weight: 86.2 kg   Height: 5\' 8"  (1.727 m)    3:04 AM Potassium repletion in progress.  Will order 2 units of packed red blood cells and have admitted for acute on chronic blood loss with symptomatic anemia.   3:07 AM Bladder scan showed approximately 125 mL of retained urine with patient unable to void.  Foley catheter placed.  Will have patient admitted for severe anemia.  Urology consult will be needed for patient's hematuria as this is suspicious for bladder cancer.   PROCEDURES   CRITICAL CARE Performed by: Shanon Rosser L Total critical care time: 30 minutes Critical care time was exclusive of separately billable procedures and treating other patients. Critical care was necessary to treat or prevent imminent or life-threatening deterioration. Critical care was time spent personally by me on the following activities: development of treatment plan with patient and/or surrogate as well as nursing, discussions with consultants, evaluation of patient's response to treatment, examination of patient, obtaining history from patient or surrogate, ordering and performing treatments and interventions, ordering and review of laboratory studies, ordering and review of radiographic studies, pulse oximetry and re-evaluation of patient's condition.   ED DIAGNOSES     ICD-10-CM   1. Anemia due to blood loss, chronic D50.0   2. Gross hematuria R31.0   3. Hypokalemia E87.6        Tudor Chandley, Jenny Reichmann, MD 01/14/18 3903    Shanon Rosser,  MD 02/16/18 0001

## 2018-01-14 NOTE — ED Notes (Signed)
PT HAVE BIG BLOOD CLOTS WHEN URINE, THE URINE WAS MOSTLY BLOOD AND THE BLOOD CLOTS BEGAN TO BLOCK THE URINE FROM COMING OUT.

## 2018-01-14 NOTE — ED Notes (Signed)
Urine and urine culture sent down  EKG given to [facility], MD

## 2018-01-14 NOTE — H&P (View-Only) (Signed)
Subjective: CC: Gross hematuria.  Hx: [name] [name] is a 72 yo BM who I was asked to see in consultation by Dr. Alcario Drought for gross hematuria with acute blood loss anemia with a Hgb of 3.8.  He has been having hematuria with clots intermittently since 12/18/17.  He has a long history of massive BPH with and saw Dr. Jasmine December in our office in 2014 for similar complaints.   He was given finasteride in the past which helped the bleeding.   He had not had bleeding since 2014 until now. He has been voiding with no hesitancy and a good stream.  He has had frequency and some nocturia that has gotten worse with this episode.  He has had slight dysuria.  He normally he has been continence but he now has UUI.  He was getting weak when he was admitted.   A renal US showed a massive prostate with some possible clots but not a large PVR.   On a CT from 2018, his prostatic ureteral length was 13.2cm but that appears to have been stable over the last 5 years.  He has a very large middle lobe that occupies a good portion of the bladder.  He has an elevated PSA of 38.32 on 01/14/18.  It was 15.1 in 9/17 and 14.1 in 09/17/12. He has declined biopsy in the past.  He remains able to function sexually.  ROS:  Review of Systems  Constitutional: Positive for malaise/fatigue.  Genitourinary: Positive for frequency, hematuria and urgency.  All other systems reviewed and are negative.   No Known Allergies  Past Medical History:  Diagnosis Date  . Blind   . Disorder of bone and cartilage, unspecified   . Edema   . Elevated prostate specific antigen (PSA)   . Hypertension   . Hypopotassemia   . Nonspecific abnormal results of liver function study   . Unspecified glaucoma(365.9)     Past Surgical History:  Procedure Laterality Date  . EYE SURGERY Bilateral 2009   for glaucoma  . EYE SURGERY Left 2014  . HERNIA REPAIR Right 2003   Dr. Jeraldine Loots  . LAPAROSCOPIC APPENDECTOMY N/A 12/24/2012   Procedure:  LAPAROSCOPIC  APPENDECTOMY ;  Surgeon: Pedro Earls, MD;  Location: [facility];  Service: General;  Laterality: N/A;    Social History   Socioeconomic History  . Marital status: Single    Spouse name: Not on file  . Number of children: Not on file  . Years of education: Not on file  . Highest education level: Not on file  Occupational History  . Not on file  Social Needs  . Financial resource strain: Not hard at all  . Food insecurity:    Worry: Never true    Inability: Never true  . Transportation needs:    Medical: No    Non-medical: No  Tobacco Use  . Smoking status: Never Smoker  . Smokeless tobacco: Never Used  Substance and Sexual Activity  . Alcohol use: No    Comment: STOPED IN 1970S  . Drug use: No  . Sexual activity: Never  Lifestyle  . Physical activity:    Days per week: 0 days    Minutes per session: 0 min  . Stress: Only a little  Relationships  . Social connections:    Talks on phone: Once a week    Gets together: Never    Attends religious service: Never    Active member of club or organization: No  Attends meetings of clubs or organizations: Never    Relationship status: Separated  . Intimate partner violence:    Fear of current or ex partner: No    Emotionally abused: No    Physically abused: No    Forced sexual activity: No  Other Topics Concern  . Not on file  Social History Narrative  . Not on file    Family History  Problem Relation Age of Onset  . Kidney failure Mother     Anti-infectives: Anti-infectives (From admission, onward)   None      Current Facility-Administered Medications  Medication Dose Route Frequency Provider Last Rate Last Dose  . 0.9 %  sodium chloride infusion (Manually program via Guardrails IV Fluids)   Intravenous Once Etta Quill, DO      . acetaminophen (TYLENOL) tablet 650 mg  650 mg Oral Q6H PRN Etta Quill, DO       Or  . acetaminophen (TYLENOL) suppository 650 mg  650 mg Rectal Q6H PRN Etta Quill, DO      . brimonidine (ALPHAGAN) 0.15 % ophthalmic solution 1 drop  1 drop Both Eyes BID Alcario Drought, Jared M, DO      . dorzolamide-timolol (COSOPT) 22.3-6.8 MG/ML ophthalmic solution 1 drop  1 drop Both Eyes BID Jennette Kettle M, DO      . finasteride (PROSCAR) tablet 5 mg  5 mg Oral Daily Patrecia Pour, Christean Grief, MD   5 mg at 01/14/18 1024  . folic acid (FOLVITE) tablet 0.5 mg  500 mcg Oral Daily Jennette Kettle M, DO   0.5 mg at 01/14/18 1023  . furosemide (LASIX) injection 20 mg  20 mg Intravenous Once Patrecia Pour, Christean Grief, MD      . hydrochlorothiazide (HYDRODIURIL) tablet 25 mg  25 mg Oral Daily Jennette Kettle M, DO   25 mg at 01/14/18 1024  . magnesium oxide (MAG-OX) tablet 200 mg  200 mg Oral Daily Jennette Kettle M, DO   200 mg at 01/14/18 1023  . ondansetron (ZOFRAN) tablet 4 mg  4 mg Oral Q6H PRN Etta Quill, DO       Or  . ondansetron [facility]) injection 4 mg  4 mg Intravenous Q6H PRN Etta Quill, DO      . pilocarpine (PILOCAR) 2 % ophthalmic solution 1 drop  1 drop Right Eye QID Jennette Kettle M, DO   1 drop at 01/14/18 1024  . pyridOXINE (VITAMIN B-6) tablet 100 mg  100 mg Oral q morning - 10a Jennette Kettle M, DO   100 mg at 01/14/18 1023  . vitamin B-12 (CYANOCOBALAMIN) tablet 1,000 mcg  1,000 mcg Oral q morning - 10a Etta Quill, DO   1,000 mcg at 01/14/18 1023  . vitamin C (ASCORBIC ACID) tablet 2,000 mg  2,000 mg Oral q morning - 10a Jennette Kettle M, DO   2,000 mg at 01/14/18 1023     Objective: Vital signs in last 24 hours: Temp:  [97.8 F (36.6 C)-98.4 F (36.9 C)] 98.2 F (36.8 C) (08/12 1032) Pulse Rate:  [73-94] 80 (08/12 1032) Resp:  [15-19] 18 (08/12 1032) BP: (116-152)/(64-90) 128/64 (08/12 1032) SpO2:  [94 %-100 %] 97 % (08/12 1032) Weight:  [86.2 kg] 86.2 kg (08/11 2356)  Intake/Output from previous day: No intake/output data recorded. Intake/Output this shift: Total I/O In: 663.5 [P.O.:240; Blood:423.5] Out: 250 [Urine:250]   Physical Exam   Constitutional: He is oriented to person, place, and time. He appears well-developed and  well-nourished.  HENT:  Head: Normocephalic.  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 Neck supple. No thyromegaly present.  Cardiovascular: Normal rate, regular rhythm and normal heart sounds.  Pulmonary/Chest: Effort normal and breath sounds normal. No respiratory distress.  Abdominal: Soft. He exhibits no distension and no mass. There is no tenderness.  Genitourinary:  Genitourinary Comments: Normal phallus with adequate meatus.  Phallus retracted secondary to bilateral moderate soft hydroceles.  Testes unremarkable. Scrotum unremarkable.   AP without lesions. NST.   Prostate is 4+ and soft.   Musculoskeletal: Normal range of motion. He exhibits edema (right > left). He exhibits no tenderness.  Neurological: He is alert and oriented to person, place, and time.  Skin: Skin is warm and dry.  Multiple melanotic lesions.   Psychiatric: He has a normal mood and affect.  Vitals reviewed.   Lab Results:  Recent Labs    01/14/18 0018  WBC 5.4  HGB 3.8*  HCT 12.5*  PLT 261   BMET Recent Labs    01/14/18 0018  NA 137  K 2.7*  CL 103  CO2 24  GLUCOSE 105*  BUN 14  CREATININE 1.07  CALCIUM 8.3*   PT/INR No results for input(s): LABPROT, INR in the last 72 hours. ABG No results for input(s): PHART, HCO3 in the last 72 hours.  Invalid input(s): PCO2, PO2  Studies/Results: US Renal  Result Date: 01/14/2018 CLINICAL DATA:  Hematuria. EXAM: RENAL / URINARY TRACT ULTRASOUND COMPLETE COMPARISON:  CT abdomen and pelvis 04/14/2017 FINDINGS: Right Kidney: Length: 11.8 cm. Multiple renal cysts are demonstrated. Largest measures 5.8 cm maximal diameter. No hydronephrosis and no solid masses identified. Left Kidney: Length: 12.3 cm. Multiple renal cysts are demonstrated. Largest measures about 5.3 cm maximal diameter. No solid mass or hydronephrosis  identified. Bladder: Hyperechoic structure inferior to the bladder measuring 8.2 x 10.9 cm in diameter. Correlating with the previous CT, this likely represents a prominent enlarged prostate gland. There is prostatic impression on the bladder base. The bladder is incompletely distended, limiting evaluation. There is suggestion of some polypoid filling defect in the bladder. This could be a lobular arising from the prostate gland or it could represent intrinsic bladder lesion such as transitional cell epithelioma or it could represent a blood clot. Consider cystoscopy for further evaluation if clinically indicated. IMPRESSION: 1. Bilateral renal cysts.  No hydronephrosis. 2. Marked enlargement of the prostate gland. 3. Bladder is incompletely distended, limiting evaluation. Suggestion of polypoid lesion in the bladder which could represent a lobule of the prostate gland protruding into the bladder or an intrinsic bladder lesion. Consider cystoscopy for further evaluation depending on clinical indications. Electronically Signed   By: Lucienne Capers M.D.   On: 01/14/2018 04:43   Labs and Imaging reviewed, including prior CT scans.   Prior office notes reviewed.   Assessment: Gross hematuria with acute blood loss anemia with massive BPH with BOO.   He will have a foley placed for irrigation.   I have asked that he be restarted on finasteride which seemed to help in the past.  He voids remarkably well despite his prostate size but may need to be considered for surgical management of his massive prostate, but his anemia will need to be managed first and cystoscopy performed to r/o associated bladder tumor.     Elevated PSA which is more than double over the past year but has been high.  The acute rise could be associated  with the bleeding event and will probably need to be reassessed at a later date.  His prostate is benign on exam but cancer is a possibility.      CC: Dr. Sharyl Nimrod.     [name] [name] 01/14/2018 [phone]

## 2018-01-14 NOTE — Consult Note (Addendum)
Subjective: CC: Gross hematuria.  Hx: Mr. Puccinelli is a 72 yo BM who I was asked to see in consultation by Dr. Alcario Drought for gross hematuria with acute blood loss anemia with a Hgb of 3.8.  He has been having hematuria with clots intermittently since 12/18/17.  He has a long history of massive BPH with and saw Dr. Jasmine December in our office in 2014 for similar complaints.   He was given finasteride in the past which helped the bleeding.   He had not had bleeding since 2014 until now. He has been voiding with no hesitancy and a good stream.  He has had frequency and some nocturia that has gotten worse with this episode.  He has had slight dysuria.  He normally he has been continence but he now has UUI.  He was getting weak when he was admitted.   A renal US showed a massive prostate with some possible clots but not a large PVR.   On a CT from 2018, his prostatic ureteral length was 13.2cm but that appears to have been stable over the last 5 years.  He has a very large middle lobe that occupies a good portion of the bladder.  He has an elevated PSA of 38.32 on 01/14/18.  It was 15.1 in 9/17 and 14.1 in 09/17/12. He has declined biopsy in the past.  He remains able to function sexually.  ROS:  Review of Systems  Constitutional: Positive for malaise/fatigue.  Genitourinary: Positive for frequency, hematuria and urgency.  All other systems reviewed and are negative.   No Known Allergies  Past Medical History:  Diagnosis Date  . Blind   . Disorder of bone and cartilage, unspecified   . Edema   . Elevated prostate specific antigen (PSA)   . Hypertension   . Hypopotassemia   . Nonspecific abnormal results of liver function study   . Unspecified glaucoma(365.9)     Past Surgical History:  Procedure Laterality Date  . EYE SURGERY Bilateral 2009   for glaucoma  . EYE SURGERY Left 2014  . HERNIA REPAIR Right 2003   Dr. Jeraldine Loots  . LAPAROSCOPIC APPENDECTOMY N/A 12/24/2012   Procedure:  LAPAROSCOPIC  APPENDECTOMY ;  Surgeon: Pedro Earls, MD;  Location: [facility];  Service: General;  Laterality: N/A;    Social History   Socioeconomic History  . Marital status: Single    Spouse name: Not on file  . Number of children: Not on file  . Years of education: Not on file  . Highest education level: Not on file  Occupational History  . Not on file  Social Needs  . Financial resource strain: Not hard at all  . Food insecurity:    Worry: Never true    Inability: Never true  . Transportation needs:    Medical: No    Non-medical: No  Tobacco Use  . Smoking status: Never Smoker  . Smokeless tobacco: Never Used  Substance and Sexual Activity  . Alcohol use: No    Comment: STOPED IN 1970S  . Drug use: No  . Sexual activity: Never  Lifestyle  . Physical activity:    Days per week: 0 days    Minutes per session: 0 min  . Stress: Only a little  Relationships  . Social connections:    Talks on phone: Once a week    Gets together: Never    Attends religious service: Never    Active member of club or organization: No  Attends meetings of clubs or organizations: Never    Relationship status: Separated  . Intimate partner violence:    Fear of current or ex partner: No    Emotionally abused: No    Physically abused: No    Forced sexual activity: No  Other Topics Concern  . Not on file  Social History Narrative  . Not on file    Family History  Problem Relation Age of Onset  . Kidney failure Mother     Anti-infectives: Anti-infectives (From admission, onward)   None      Current Facility-Administered Medications  Medication Dose Route Frequency Provider Last Rate Last Dose  . 0.9 %  sodium chloride infusion (Manually program via Guardrails IV Fluids)   Intravenous Once Etta Quill, DO      . acetaminophen (TYLENOL) tablet 650 mg  650 mg Oral Q6H PRN Etta Quill, DO       Or  . acetaminophen (TYLENOL) suppository 650 mg  650 mg Rectal Q6H PRN Etta Quill, DO      . brimonidine (ALPHAGAN) 0.15 % ophthalmic solution 1 drop  1 drop Both Eyes BID Alcario Drought, Jared M, DO      . dorzolamide-timolol (COSOPT) 22.3-6.8 MG/ML ophthalmic solution 1 drop  1 drop Both Eyes BID Jennette Kettle M, DO      . finasteride (PROSCAR) tablet 5 mg  5 mg Oral Daily Patrecia Pour, Christean Grief, MD   5 mg at 01/14/18 1024  . folic acid (FOLVITE) tablet 0.5 mg  500 mcg Oral Daily Jennette Kettle M, DO   0.5 mg at 01/14/18 1023  . furosemide (LASIX) injection 20 mg  20 mg Intravenous Once Patrecia Pour, Christean Grief, MD      . hydrochlorothiazide (HYDRODIURIL) tablet 25 mg  25 mg Oral Daily Jennette Kettle M, DO   25 mg at 01/14/18 1024  . magnesium oxide (MAG-OX) tablet 200 mg  200 mg Oral Daily Jennette Kettle M, DO   200 mg at 01/14/18 1023  . ondansetron (ZOFRAN) tablet 4 mg  4 mg Oral Q6H PRN Etta Quill, DO       Or  . ondansetron [facility]) injection 4 mg  4 mg Intravenous Q6H PRN Etta Quill, DO      . pilocarpine (PILOCAR) 2 % ophthalmic solution 1 drop  1 drop Right Eye QID Jennette Kettle M, DO   1 drop at 01/14/18 1024  . pyridOXINE (VITAMIN B-6) tablet 100 mg  100 mg Oral q morning - 10a Jennette Kettle M, DO   100 mg at 01/14/18 1023  . vitamin B-12 (CYANOCOBALAMIN) tablet 1,000 mcg  1,000 mcg Oral q morning - 10a Etta Quill, DO   1,000 mcg at 01/14/18 1023  . vitamin C (ASCORBIC ACID) tablet 2,000 mg  2,000 mg Oral q morning - 10a Jennette Kettle M, DO   2,000 mg at 01/14/18 1023     Objective: Vital signs in last 24 hours: Temp:  [97.8 F (36.6 C)-98.4 F (36.9 C)] 98.2 F (36.8 C) (08/12 1032) Pulse Rate:  [73-94] 80 (08/12 1032) Resp:  [15-19] 18 (08/12 1032) BP: (116-152)/(64-90) 128/64 (08/12 1032) SpO2:  [94 %-100 %] 97 % (08/12 1032) Weight:  [86.2 kg] 86.2 kg (08/11 2356)  Intake/Output from previous day: No intake/output data recorded. Intake/Output this shift: Total I/O In: 663.5 [P.O.:240; Blood:423.5] Out: 250 [Urine:250]   Physical Exam   Constitutional: He is oriented to person, place, and time. He appears well-developed and  well-nourished.  HENT:  Head: Normocephalic.  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 Neck supple. No thyromegaly present.  Cardiovascular: Normal rate, regular rhythm and normal heart sounds.  Pulmonary/Chest: Effort normal and breath sounds normal. No respiratory distress.  Abdominal: Soft. He exhibits no distension and no mass. There is no tenderness.  Genitourinary:  Genitourinary Comments: Normal phallus with adequate meatus.  Phallus retracted secondary to bilateral moderate soft hydroceles.  Testes unremarkable. Scrotum unremarkable.   AP without lesions. NST.   Prostate is 4+ and soft.   Musculoskeletal: Normal range of motion. He exhibits edema (right > left). He exhibits no tenderness.  Neurological: He is alert and oriented to person, place, and time.  Skin: Skin is warm and dry.  Multiple melanotic lesions.   Psychiatric: He has a normal mood and affect.  Vitals reviewed.   Lab Results:  Recent Labs    01/14/18 0018  WBC 5.4  HGB 3.8*  HCT 12.5*  PLT 261   BMET Recent Labs    01/14/18 0018  NA 137  K 2.7*  CL 103  CO2 24  GLUCOSE 105*  BUN 14  CREATININE 1.07  CALCIUM 8.3*   PT/INR No results for input(s): LABPROT, INR in the last 72 hours. ABG No results for input(s): PHART, HCO3 in the last 72 hours.  Invalid input(s): PCO2, PO2  Studies/Results: US Renal  Result Date: 01/14/2018 CLINICAL DATA:  Hematuria. EXAM: RENAL / URINARY TRACT ULTRASOUND COMPLETE COMPARISON:  CT abdomen and pelvis 04/14/2017 FINDINGS: Right Kidney: Length: 11.8 cm. Multiple renal cysts are demonstrated. Largest measures 5.8 cm maximal diameter. No hydronephrosis and no solid masses identified. Left Kidney: Length: 12.3 cm. Multiple renal cysts are demonstrated. Largest measures about 5.3 cm maximal diameter. No solid mass or hydronephrosis  identified. Bladder: Hyperechoic structure inferior to the bladder measuring 8.2 x 10.9 cm in diameter. Correlating with the previous CT, this likely represents a prominent enlarged prostate gland. There is prostatic impression on the bladder base. The bladder is incompletely distended, limiting evaluation. There is suggestion of some polypoid filling defect in the bladder. This could be a lobular arising from the prostate gland or it could represent intrinsic bladder lesion such as transitional cell epithelioma or it could represent a blood clot. Consider cystoscopy for further evaluation if clinically indicated. IMPRESSION: 1. Bilateral renal cysts.  No hydronephrosis. 2. Marked enlargement of the prostate gland. 3. Bladder is incompletely distended, limiting evaluation. Suggestion of polypoid lesion in the bladder which could represent a lobule of the prostate gland protruding into the bladder or an intrinsic bladder lesion. Consider cystoscopy for further evaluation depending on clinical indications. Electronically Signed   By: Lucienne Capers M.D.   On: 01/14/2018 04:43   Labs and Imaging reviewed, including prior CT scans.   Prior office notes reviewed.   Assessment: Gross hematuria with acute blood loss anemia with massive BPH with BOO.   He will have a foley placed for irrigation.   I have asked that he be restarted on finasteride which seemed to help in the past.  He voids remarkably well despite his prostate size but may need to be considered for surgical management of his massive prostate, but his anemia will need to be managed first and cystoscopy performed to r/o associated bladder tumor.     Elevated PSA which is more than double over the past year but has been high.  The acute rise could be associated  with the bleeding event and will probably need to be reassessed at a later date.  His prostate is benign on exam but cancer is a possibility.      CC: Dr. Sharyl Nimrod.     Irine Seal 01/14/[phone]

## 2018-01-14 NOTE — H&P (Signed)
History and Physical    Aldean Ast. HQI:[id number] DOB: 31-Mar-1946 DOA: 01/14/2018  PCP: Gildardo Cranker, DO  Patient coming from: Home  I have personally briefly reviewed patient's old medical records in [place]  Chief Complaint: Hematuria  HPI: [name] [name]. is a 72 y.o. male with medical history significant of legally blind, HTN.  Patient has apparently been having intermittent episodes of hematuria since 2014.  Despite this and despite referrals to urology, he hasnt actually seen urology or had work up as to cause.  This episode onset July 16th.  Fairly large amount of blood loss he thinks.  Felt "like he was loosing too much blood" and so decided to come in to the ED today 8/12.   ED Course: HGB 3.8!!! K 2.7.  UA is still pending, but is grossly bloody according to RN staff.  Post void residual of just 147 though, Renal function looks OKAY on todays BMP so no reason to put in 3 way at this point.  On ROS does note he has occasional swelling of L inguinal hernia since 2014 (R inguinal hernia was repaired back in 2003).  Review of Systems: As per HPI otherwise 10 point review of systems negative.   Past Medical History:  Diagnosis Date  . Blind   . Disorder of bone and cartilage, unspecified   . Edema   . Elevated prostate specific antigen (PSA)   . Hypertension   . Hypopotassemia   . Nonspecific abnormal results of liver function study   . Unspecified glaucoma(365.9)     Past Surgical History:  Procedure Laterality Date  . EYE SURGERY Bilateral 2009   for glaucoma  . EYE SURGERY Left 2014  . HERNIA REPAIR Right 2003   Dr. Jeraldine Loots  . LAPAROSCOPIC APPENDECTOMY N/A 12/24/2012   Procedure:  LAPAROSCOPIC APPENDECTOMY ;  Surgeon: Pedro Earls, MD;  Location: [facility];  Service: General;  Laterality: N/A;     reports that he has never smoked. He has never used smokeless tobacco. He reports that he does not drink alcohol or use drugs.  No  Known Allergies  Family History  Problem Relation Age of Onset  . Kidney failure Mother      Prior to Admission medications   Medication Sig Start Date End Date Taking? Authorizing Provider  brimonidine (ALPHAGAN P) 0.1 % SOLN Place 1 drop into both eyes 2 (two) times daily.    Yes [provider]  cyanocobalamin 500 MCG tablet Take 1,000 mcg by mouth every morning.    Yes [provider]  dorzolamide-timolol (COSOPT) 22.3-6.8 MG/ML ophthalmic solution APPOINTMENT OVERDUE, instill 1 drop in both eyes twice daily Patient taking differently: Place 1 drop into both eyes 2 (two) times daily. APPOINTMENT OVERDUE, instill 1 drop in both eyes twice daily 07/14/13  Yes [name], NP  folic acid (FOLVITE) 132 MCG tablet Take 400 mcg by mouth daily.   Yes [provider]  hydrochlorothiazide (HYDRODIURIL) 25 MG tablet TAKE 1 TABLET BY MOUTH EVERY DAY Patient taking differently: Take 25 mg by mouth daily.  04/18/17  Yes Gildardo Cranker, DO  Magnesium 250 MG TABS Take 250 mg by mouth daily.   Yes [provider]  pilocarpine (PILOCAR) 2 % ophthalmic solution Place 1 drop into the right eye 4 (four) times daily.  12/25/17  Yes [provider]  pyridOXINE (VITAMIN B-6) 100 MG tablet Take 100 mg by mouth every morning.   Yes [provider]  vitamin C (ASCORBIC ACID) 500 MG tablet Take 2,000 mg by mouth every morning.    Yes [provider]    Physical Exam: Vitals:   01/13/18 2356 01/14/18 0230  BP: 130/72 (!) 146/76  Pulse: 84 84  Resp: 15 19  Temp: 97.8 F (36.6 C)   TempSrc: Oral   SpO2: 95% 95%  Weight: 86.2 kg   Height: 5\' 8"  (1.727 m)     Constitutional: NAD, calm, comfortable Eyes: Anisocoria, sluggish pupils, cannot count fingers, dysconjugate gaze ENMT: Mucous membranes are moist. Posterior pharynx clear of any exudate or lesions.Normal dentition.  Neck: normal, supple, no masses, no thyromegaly Respiratory: clear  to auscultation bilaterally, no wheezing, no crackles. Normal respiratory effort. No accessory muscle use.  Cardiovascular: Regular rate and rhythm, no murmurs / rubs / gallops. No extremity edema. 2+ pedal pulses. No carotid bruits.  Abdomen: no tenderness, no masses palpated. No hepatosplenomegaly. Bowel sounds positive.  Musculoskeletal: no clubbing / cyanosis. No joint deformity upper and lower extremities. Good ROM, no contractures. Normal muscle tone.  Skin: no rashes, lesions, ulcers. No induration Neurologic: CN 2-12 grossly intact. Sensation intact, DTR normal. Strength 5/5 in all 4.  Psychiatric: Normal judgment and insight. Alert and oriented x 3. Normal mood.    Labs on Admission: I have personally reviewed following labs and imaging studies  CBC: Recent Labs  Lab 01/14/18 0018  WBC 5.4  NEUTROABS 3.2  HGB 3.8*  HCT 12.5*  MCV 73.1*  PLT 409   Basic Metabolic Panel: Recent Labs  Lab 01/14/18 0018  NA 137  K 2.7*  CL 103  CO2 24  GLUCOSE 105*  BUN 14  CREATININE 1.07  CALCIUM 8.3*   GFR: Estimated Creatinine Clearance: 67.6 mL/min (by C-G formula based on SCr of 1.07 mg/dL). Liver Function Tests: No results for input(s): AST, ALT, ALKPHOS, BILITOT, PROT, ALBUMIN in the last 168 hours. No results for input(s): LIPASE, AMYLASE in the last 168 hours. No results for input(s): AMMONIA in the last 168 hours. Coagulation Profile: No results for input(s): INR, PROTIME in the last 168 hours. Cardiac Enzymes: No results for input(s): CKTOTAL, CKMB, CKMBINDEX, TROPONINI in the last 168 hours. BNP (last 3 results) No results for input(s): PROBNP in the last 8760 hours. HbA1C: No results for input(s): HGBA1C in the last 72 hours. CBG: No results for input(s): GLUCAP in the last 168 hours. Lipid Profile: No results for input(s): CHOL, HDL, LDLCALC, TRIG, CHOLHDL, LDLDIRECT in the last 72 hours. Thyroid Function Tests: No results for input(s): TSH, T4TOTAL, FREET4,  T3FREE, THYROIDAB in the last 72 hours. Anemia Panel: No results for input(s): VITAMINB12, FOLATE, FERRITIN, TIBC, IRON, RETICCTPCT in the last 72 hours. Urine analysis:    Component Value Date/Time   COLORURINE RED (A) 04/14/2017 1633   APPEARANCEUR TURBID (A) 04/14/2017 1633   LABSPEC  04/14/2017 1633    TEST NOT REPORTED DUE TO COLOR INTERFERENCE OF URINE PIGMENT   PHURINE  04/14/2017 1633    TEST NOT REPORTED DUE TO COLOR INTERFERENCE OF URINE PIGMENT   GLUCOSEU (A) 04/14/2017 1633    TEST NOT REPORTED DUE TO COLOR INTERFERENCE OF URINE PIGMENT   HGBUR (A) 04/14/2017 1633    TEST NOT REPORTED DUE TO COLOR INTERFERENCE OF URINE PIGMENT   BILIRUBINUR (A) 04/14/2017 1633    TEST NOT REPORTED DUE TO COLOR INTERFERENCE OF URINE PIGMENT   KETONESUR (A) 04/14/2017 1633    TEST NOT REPORTED DUE TO COLOR INTERFERENCE OF URINE PIGMENT  PROTEINUR (A) 04/14/2017 1633    TEST NOT REPORTED DUE TO COLOR INTERFERENCE OF URINE PIGMENT   UROBILINOGEN 0.2 12/27/2012 2241   NITRITE (A) 04/14/2017 1633    TEST NOT REPORTED DUE TO COLOR INTERFERENCE OF URINE PIGMENT   LEUKOCYTESUR (A) 04/14/2017 1633    TEST NOT REPORTED DUE TO COLOR INTERFERENCE OF URINE PIGMENT    Radiological Exams on Admission: No results found.  EKG: Independently reviewed.  Assessment/Plan Principal Problem:   Acute blood loss anemia Active Problems:   Hypertension   Hematuria   Hypokalemia   Legally blind    1. Acute blood loss anemia -  1. Transfuse 2u PRBC now 2. Repeat H/H post transfusion 3. Reassess patient for diuretic needs post transfusion 4. 2 more units are being held for likely later this morning. 2. Hematuria - 1. Call urology for evaluation in AM 2. US renal ordered 3. PSA ordered, elevated in past and Prostate is known to be markedly enlarged (13.2cm in Nov last year). 4. Obviously, I am highly concerned for malignancy given presentation, and did stress importance of getting this followed up  on to patient. 3. Hypokalemia - 1. Replacing K 2. Repeat K ordered for Noon 4. Blindness - continue eye drops 5. HTN - continue HCTZ 6. L inguinal hernia - 1. Doubt that this is related to recurrent hematuria, was fat containing only and small on last CT scan 04/2017  DVT prophylaxis: SCDs Code Status: Full Family Communication: No family in room Disposition Plan: Home after admit Consults called: None, call urology in AM for IP evaluation Admission status: Admit to inpatient   Etta Quill DO [facility] Pager [phone] Only works nights!  If 7AM-7PM, please contact the primary day team physician taking care of patient  [email or web address] Password [facility]  01/14/2018, 3:45 AM

## 2018-01-14 NOTE — Progress Notes (Signed)
Verified with Dr. Jeffie Pollock to keep 3-way foley catheter irrigation port plugged and hand irrigate PRN blood clots.

## 2018-01-14 NOTE — Progress Notes (Signed)
PROGRESS NOTE  [name] [name]. NWG:[id number] DOB: 03/25/1946 DOA: 01/14/2018 PCP: Gildardo Cranker, DO   Brief Narrative:  [name] [name] is a 72 y.o. male with a past medical history significant for hypertension and blindness. He has had intermittent episodes of hematuria since 2014, but has not followed closely with urology as he has been advised. He has been told in the past that he has an enlarged prostate (was 13.2 cm in 11/18). He presented to the emergency department on 8/12 complaining of gross painless hematuria since July 16th, 2019 and felt that he was "losing too much blood." He was found to have a Hb of 3.8 and K 2.7. He was given 2 units of PRBCs and supplemental K. He was admitted to the hospital with the working diagnosis of acute blood loss anemia secondary to gross hematuria.   Subjective: Patient is doing well this morning and has no new complaints. He does endorse some mild dysuria with hematuria episodes. He reports that he is urinating well and that it appears a lighter pink in color. He is still passing some blood clots. He had one episode of incontinence over night. He reports no recent weight loss, fevers/chills, NVD, abdominal pani, dizziness, SOB or CP.   Assessment & Plan:  Acute Blood Loss Anemia secondary to Gross Hematuria - US Renal shows bilateral renal cysts and polypoid lesion in bladder which could be from the prostate or within the bladder itself.  - Urology consulted - appreciate their evaluation and recommendations. Started on 5mg  finasteride.  - PSA 38. With present symptoms, suspicions raised for malignancy. - Transfuse 2 units PRBC, followed by repeat H/H. 2 more units are currently held and will likely be needed. LE edema noted - Added 20mg  IV lasix for transfusion related volume overload. - Order placed for foley catheter. May need CBI.  Hypokalemia - Monitor closely with CMP. Supplemental K PO as needed. - Will check magnesium.  Hypertension -  Currently stable. Continue 25mg  HCTZ.   Blindness - Continue home eyedrops (pilocar, alphagan, cosopt)  DVT prophylaxis: SCDs  Code Status: FULL  Family Communication: No family at bedside Disposition Plan: Unclear, pending urology work up and clinical improvement.  Consultants:   Urology  Procedures:   None  Antimicrobials:   None   Objective: Vitals:   01/14/18 0406 01/14/18 0602 01/14/18 0617 01/14/18 0910  BP: (!) 142/76 119/66 (!) 142/75 116/64  Pulse: 75 94 73 83  Resp: 16 16 16 16   Temp: 98 F (36.7 C) 97.9 F (36.6 C) 98.4 F (36.9 C) 98.1 F (36.7 C)  TempSrc: Oral Oral Oral Oral  SpO2: 94%  95% 100%  Weight:      Height:        Intake/Output Summary (Last 24 hours) at 01/14/2018 0951 Last data filed at 01/14/2018 5784 Gross per 24 hour  Intake 663.5 ml  Output -  Net 663.5 ml   Filed Weights   01/13/18 2356  Weight: 86.2 kg    Examination: General appearance: adult male, awake and alert laying in bed NAD.   HEENT: Anicteric, dysconjugate gaze, conjunctival pallor noted, lids and lashes normal. No nasal deformity, discharge, epistaxis. Skin: Warm and dry. No jaundice. No suspicious rashes or lesions. Cardiac: RRR, nl S1-S2, no murmurs appreciated. No JVD. +1 pitting edema noted bilaterally. No cyanosis. Distal pulses are intact bilaterally. Respiratory: Normal respiratory rate and rhythm. CTAB without wheezes, crackles, or rales. Abdomen: Abdomen soft and non-distended. No TTP. Positive bowel sounds throughout. Neuro: AOx3.  Moves all extremities. Speech fluent.    Psych: Attention normal. Affect normal. Judgment and insight appear normal.  Data Reviewed: I have personally reviewed following labs and imaging studies:  CBC: Recent Labs  Lab 01/14/18 0018  WBC 5.4  NEUTROABS 3.2  HGB 3.8*  HCT 12.5*  MCV 73.1*  PLT 883   Basic Metabolic Panel: Recent Labs  Lab 01/14/18 0018  NA 137  K 2.7*  CL 103  CO2 24  GLUCOSE 105*  BUN 14    CREATININE 1.07  CALCIUM 8.3*   GFR: Estimated Creatinine Clearance: 67.6 mL/min (by C-G formula based on SCr of 1.07 mg/dL).  Urine analysis:    Component Value Date/Time   COLORURINE RED (A) 01/14/2018 0100   APPEARANCEUR TURBID (A) 01/14/2018 0100   LABSPEC  01/14/2018 0100    TEST NOT REPORTED DUE TO COLOR INTERFERENCE OF URINE PIGMENT   PHURINE  01/14/2018 0100    TEST NOT REPORTED DUE TO COLOR INTERFERENCE OF URINE PIGMENT   GLUCOSEU (A) 01/14/2018 0100    TEST NOT REPORTED DUE TO COLOR INTERFERENCE OF URINE PIGMENT   HGBUR (A) 01/14/2018 0100    TEST NOT REPORTED DUE TO COLOR INTERFERENCE OF URINE PIGMENT   BILIRUBINUR (A) 01/14/2018 0100    TEST NOT REPORTED DUE TO COLOR INTERFERENCE OF URINE PIGMENT   KETONESUR (A) 01/14/2018 0100    TEST NOT REPORTED DUE TO COLOR INTERFERENCE OF URINE PIGMENT   PROTEINUR (A) 01/14/2018 0100    TEST NOT REPORTED DUE TO COLOR INTERFERENCE OF URINE PIGMENT   UROBILINOGEN 0.2 12/27/2012 2241   NITRITE (A) 01/14/2018 0100    TEST NOT REPORTED DUE TO COLOR INTERFERENCE OF URINE PIGMENT   LEUKOCYTESUR (A) 01/14/2018 0100    TEST NOT REPORTED DUE TO COLOR INTERFERENCE OF URINE PIGMENT   Radiology Studies: US Renal  Result Date: 01/14/2018 CLINICAL DATA:  Hematuria. EXAM: RENAL / URINARY TRACT ULTRASOUND COMPLETE COMPARISON:  CT abdomen and pelvis 04/14/2017 FINDINGS: Right Kidney: Length: 11.8 cm. Multiple renal cysts are demonstrated. Largest measures 5.8 cm maximal diameter. No hydronephrosis and no solid masses identified. Left Kidney: Length: 12.3 cm. Multiple renal cysts are demonstrated. Largest measures about 5.3 cm maximal diameter. No solid mass or hydronephrosis identified. Bladder: Hyperechoic structure inferior to the bladder measuring 8.2 x 10.9 cm in diameter. Correlating with the previous CT, this likely represents a prominent enlarged prostate gland. There is prostatic impression on the bladder base. The bladder is incompletely  distended, limiting evaluation. There is suggestion of some polypoid filling defect in the bladder. This could be a lobular arising from the prostate gland or it could represent intrinsic bladder lesion such as transitional cell epithelioma or it could represent a blood clot. Consider cystoscopy for further evaluation if clinically indicated. IMPRESSION: 1. Bilateral renal cysts.  No hydronephrosis. 2. Marked enlargement of the prostate gland. 3. Bladder is incompletely distended, limiting evaluation. Suggestion of polypoid lesion in the bladder which could represent a lobule of the prostate gland protruding into the bladder or an intrinsic bladder lesion. Consider cystoscopy for further evaluation depending on clinical indications. Electronically Signed   By: Lucienne Capers M.D.   On: 01/14/2018 04:43   Scheduled Meds: . sodium chloride   Intravenous Once  . brimonidine  1 drop Both Eyes BID  . dorzolamide-timolol  1 drop Both Eyes BID  . finasteride  5 mg Oral Daily  . folic acid  254 mcg Oral Daily  . hydrochlorothiazide  25 mg Oral Daily  .  magnesium oxide  200 mg Oral Daily  . pilocarpine  1 drop Right Eye QID  . pyridOXINE  100 mg Oral q morning - 10a  . vitamin B-12  1,000 mcg Oral q morning - 10a  . vitamin C  2,000 mg Oral q morning - 10a    LOS: 0 days   Dajae Kizer, PA-S [facility] 01/14/2018, 9:51 AM   [email or web address] Password [email or web address] If 7PM-7AM, please contact night-coverage

## 2018-01-14 NOTE — ED Notes (Signed)
Date and time results received: 01/14/18 0238  Test: Hemoglobin Critical Value: 3.8  Name of Provider Notified: [place]

## 2018-01-14 NOTE — ED Notes (Signed)
BLADDER SCAN SHOWS 113ml...Marland KitchenMarland Kitchen

## 2018-01-14 NOTE — ED Notes (Signed)
Bed: WA03 Expected date:  Expected time:  Means of arrival:  Comments: 

## 2018-01-14 NOTE — Progress Notes (Signed)
Patient has had two episodes of urine leaking around the catheter. RN present at bedside for the second episode and noted blood clots in the foley tubing. When emptying the foley catheter bag there were a few medium sized blood clots noted. Night RN at bedside performing hand irrigation to remove any more clots.   Third unit of PRBC almost finished infusing. Night RN to f/u.   Emer Onnen, Fraser Din 01/14/2018 7:45 PM

## 2018-01-14 NOTE — ED Notes (Signed)
ED TO INPATIENT HANDOFF REPORT  Name/Age/Gender Aldean Ast. 72 y.o. male  Code Status    Code Status Orders  (From admission, onward)         Start     Ordered   01/14/18 0309  Full code  Continuous     01/14/18 0320        Code Status History    Date Active Date Inactive Code Status Order ID Comments User Context   12/24/2012 1113 12/25/2012 1818 Full Code [id number]  Pedro Earls, MD Inpatient   12/23/2012 0547 12/24/2012 1113 Full Code [id number]  Elvera Lennox, MD ED      Home/SNF/Other Home  Chief Complaint Urinary Retention  Level of Care/Admitting Diagnosis ED Disposition    ED Disposition Condition [facility]: [facility] [[id number]]  Level of Care: Telemetry [5]  Admit to tele based on following criteria: Other see comments  Comments: HGB 3.8, transfusing  Diagnosis: Acute blood loss anemia [354656]  Admitting Physician: Doreatha Massed  Attending Physician: Etta Quill [phone]  Estimated length of stay: past midnight tomorrow  Certification:: I certify this patient will need inpatient services for at least 2 midnights  PT Class (Do Not Modify): Inpatient [101]  PT Acc Code (Do Not Modify): Private [1]       Medical History Past Medical History:  Diagnosis Date  . Blind   . Disorder of bone and cartilage, unspecified   . Edema   . Elevated prostate specific antigen (PSA)   . Hypertension   . Hypopotassemia   . Nonspecific abnormal results of liver function study   . Unspecified glaucoma(365.9)     Allergies No Known Allergies  IV Location/Drains/Wounds Patient Lines/Drains/Airways Status   Active Line/Drains/Airways    Name:   Placement date:   Placement time:   Site:   Days:   Peripheral IV 01/14/18 Right;Upper Arm   01/14/18    0043    Arm   less than 1   Peripheral IV 01/14/18 Left Antecubital   01/14/18    0255    Antecubital   less than 1   Urethral Catheter Cheral Bay, RN  Triple-lumen 22 Fr.   04/14/17    2159    Triple-lumen   275   Incision 12/24/12 N/A   12/24/12    0942     1847   Incision - 3 Ports Abdomen 1: Umbilicus 2: Left 3: Right   12/24/12    0855     1847          Labs/Imaging Results for orders placed or performed during the hospital encounter of 01/14/18 (from the past 48 hour(s))  CBC with Differential/Platelet     Status: Abnormal   Collection Time: 01/14/18 12:18 AM  Result Value Ref Range   WBC 5.4 4.0 - 10.5 K/uL   RBC 1.71 (L) 4.22 - 5.81 MIL/uL   Hemoglobin 3.8 (LL) 13.0 - 17.0 g/dL    Comment: CRITICAL RESULT CALLED TO, READ BACK BY AND VERIFIED WITH: SARAH,G RN AT 0238 01/14/18 BY TIBBITTS,K    HCT 12.5 (L) 39.0 - 52.0 %   MCV 73.1 (L) 78.0 - 100.0 fL   MCH 22.2 (L) 26.0 - 34.0 pg   MCHC 30.4 30.0 - 36.0 g/dL   RDW 21.7 (H) 11.5 - 15.5 %   Platelets 261 150 - 400 K/uL   Neutrophils Relative % 59 %   Neutro Abs 3.2 1.7 -  7.7 K/uL   Lymphocytes Relative 31 %   Lymphs Abs 1.7 0.7 - 4.0 K/uL   Monocytes Relative 9 %   Monocytes Absolute 0.5 0.1 - 1.0 K/uL   Eosinophils Relative 0 %   Eosinophils Absolute 0.0 0.0 - 0.7 K/uL   Basophils Relative 1 %   Basophils Absolute 0.1 0.0 - 0.1 K/uL   RBC Morphology POLYCHROMASIA PRESENT     Comment: Performed at [facility], [place] [address], [place], [place] [address]  Basic metabolic panel     Status: Abnormal   Collection Time: 01/14/18 12:18 AM  Result Value Ref Range   Sodium 137 135 - 145 mmol/L   Potassium 2.7 (LL) 3.5 - 5.1 mmol/L    Comment: CRITICAL RESULT CALLED TO, READ BACK BY AND VERIFIED WITH: GARIFO A RN 0132 01/14/2018 HILL K    Chloride 103 98 - 111 mmol/L   CO2 24 22 - 32 mmol/L   Glucose, Bld 105 (H) 70 - 99 mg/dL   BUN 14 8 - 23 mg/dL   Creatinine, Ser 1.07 0.61 - 1.24 mg/dL   Calcium 8.3 (L) 8.9 - 10.3 mg/dL   GFR calc non Af Amer >60 >60 mL/min   GFR calc Af Amer >60 >60 mL/min    Comment: (NOTE) The eGFR has been calculated using  the CKD EPI equation. This calculation has not been validated in all clinical situations. eGFR's persistently <60 mL/min signify possible Chronic Kidney Disease.    Anion gap 10 5 - 15    Comment: Performed at [facility], [place] [address] [place], [place] [address]  Type and screen [place]     Status: None   Collection Time: 01/14/18  1:00 AM  Result Value Ref Range   ABO/RH(D) B POS    Antibody Screen NEG    Sample Expiration      01/17/2018 Performed at [facility], [place] [address] [place], [place] [address]    No results found.  Pending Labs Unresulted Labs (From admission, onward)    Start     Ordered   01/15/18 3818  Basic metabolic panel  Tomorrow morning,   R     01/14/18 0320   01/15/18 0500  CBC  Tomorrow morning,   R     01/14/18 0320   01/14/18 0312  PSA  Once,   R     01/14/18 0320   01/14/18 0306  Prepare RBC  (Adult Blood Administration - Red Blood Cells)  Once,   R    Question Answer Comment  # of Units 4 units   Transfusion Indications Symptomatic Anemia   Transfusion Indications Actively Bleeding / GI Bleed   If emergent release call blood bank Not emergent release      01/14/18 0306   01/14/18 0018  Urine culture  STAT,   STAT     01/14/18 0017   01/14/18 0002  Urinalysis, Routine w reflex microscopic- may I&O cath if menses  Once,   STAT     01/14/18 0001          Vitals/Pain Today's Vitals   01/13/18 2356 01/14/18 0001 01/14/18 0230  BP: 130/72  (!) 146/76  Pulse: 84  84  Resp: 15  19  Temp: 97.8 F (36.6 C)    TempSrc: Oral    SpO2: 95%  95%  Weight: 86.2 kg    Height: '5\' 8"'$  (1.727 m)    PainSc:  0-No  pain     Isolation Precautions No active isolations  Medications Medications  potassium chloride 10 mEq in 100 mL IVPB (10 mEq Intravenous New Bag/Given 01/14/18 0224)  0.9 %  sodium chloride infusion (Manually program via Guardrails IV Fluids) (has no administration in  time range)  acetaminophen (TYLENOL) tablet 650 mg (has no administration in time range)    Or  acetaminophen (TYLENOL) suppository 650 mg (has no administration in time range)  ondansetron (ZOFRAN) tablet 4 mg (has no administration in time range)    Or  ondansetron (ZOFRAN) injection 4 mg (has no administration in time range)  potassium chloride SA (K-DUR,KLOR-CON) CR tablet 40 mEq (40 mEq Oral Given 01/14/18 0225)    Mobility walks

## 2018-01-14 NOTE — ED Notes (Signed)
Date and time results received: 01/14/18 0132  Test. Potassium Critical Value: 2.7  Name of Provider Notified: [place]

## 2018-01-14 NOTE — ED Notes (Addendum)
Attempted to get urine sample. Pt unable to do so at this time.  Pt is aware that urine sample is needed and will let the staff know when he needs to give one

## 2018-01-15 DIAGNOSIS — D500 Iron deficiency anemia secondary to blood loss (chronic): Secondary | ICD-10-CM

## 2018-01-15 LAB — MAGNESIUM: Magnesium: 2.1 mg/dL (ref 1.7–2.4)

## 2018-01-15 LAB — CBC
HCT: 24.4 % — ABNORMAL LOW (ref 39.0–52.0)
Hemoglobin: 7.9 g/dL — ABNORMAL LOW (ref 13.0–17.0)
MCH: 25.1 pg — ABNORMAL LOW (ref 26.0–34.0)
MCHC: 32.4 g/dL (ref 30.0–36.0)
MCV: 77.5 fL — ABNORMAL LOW (ref 78.0–100.0)
Platelets: 228 10*3/uL (ref 150–400)
RBC: 3.15 MIL/uL — ABNORMAL LOW (ref 4.22–5.81)
RDW: 19 % — ABNORMAL HIGH (ref 11.5–15.5)
WBC: 8.5 10*3/uL (ref 4.0–10.5)

## 2018-01-15 LAB — URINE CULTURE: Culture: <10000 — AB

## 2018-01-15 LAB — BASIC METABOLIC PANEL
Anion gap: 11 (ref 5–15)
BUN: 12 mg/dL (ref 8–23)
CO2: 25 mmol/L (ref 22–32)
Calcium: 8.1 mg/dL — ABNORMAL LOW (ref 8.9–10.3)
Chloride: 106 mmol/L (ref 98–111)
Creatinine, Ser: 0.84 mg/dL (ref 0.61–1.24)
GFR calc Af Amer: >60 mL/min (ref >60)
GFR calc non Af Amer: >60 mL/min (ref >60)
Glucose, Bld: 96 mg/dL (ref 70–99)
Potassium: 3.4 mmol/L — ABNORMAL LOW (ref 3.5–5.1)
Sodium: 142 mmol/L (ref 135–145)

## 2018-01-15 MED ORDER — OXYBUTYNIN CHLORIDE 5 MG PO TABS
5.0000 mg | ORAL_TABLET | Freq: Once | ORAL | Status: AC
  Administered 2018-01-15: 5 mg via ORAL
  Filled 2018-01-15: qty 1

## 2018-01-15 MED ORDER — HYDROXYZINE HCL 25 MG PO TABS
25.0000 mg | ORAL_TABLET | Freq: Three times a day (TID) | ORAL | Status: DC | PRN
  Administered 2018-01-15 – 2018-01-17 (×2): 25 mg via ORAL
  Filled 2018-01-15 (×2): qty 1

## 2018-01-15 MED ORDER — POTASSIUM CHLORIDE CRYS ER 20 MEQ PO TBCR
40.0000 meq | EXTENDED_RELEASE_TABLET | Freq: Once | ORAL | Status: AC
  Administered 2018-01-15: 40 meq via ORAL
  Filled 2018-01-15: qty 2

## 2018-01-15 MED ORDER — FUROSEMIDE 20 MG PO TABS
20.0000 mg | ORAL_TABLET | Freq: Every day | ORAL | Status: DC
  Administered 2018-01-17 – 2018-01-18 (×2): 20 mg via ORAL
  Filled 2018-01-15 (×2): qty 1

## 2018-01-15 MED ORDER — SODIUM CHLORIDE 0.9 % IR SOLN
3000.0000 mL | Status: DC
  Administered 2018-01-15 – 2018-01-18 (×22): 3000 mL

## 2018-01-15 NOTE — Progress Notes (Signed)
Just exchanged patient's catheter to a new 26F 3Way Catheter per MD. Shortly after exchanging it and hooking catheter back to continuous bladder irrigation patient was has feelings of pressure and started leaking around catheter again. RN hand irrigated and got multiple clots out. Currently foley catheter is hooked back up to CBI going at a moderate rate. Will continue to monitor patient. Carmela Hurt, RN

## 2018-01-15 NOTE — Progress Notes (Signed)
PROGRESS NOTE  [name] [name].  CHE:[id number] DOB: Sep 18, 1945 DOA: 01/14/2018 PCP: No primary care provider on file.   Brief Narrative:  [name] [name] is a 72 y.o. male with a past medical history significant for hypertension and blindness. He has had intermittent episodes of hematuria since 2014, but has not followed closely with urology as he has been advised. He has been told in the past that he has an enlarged prostate (was 13.2 cm in 11/18). He presented to the emergency department on 8/12 complaining of gross painless hematuria since July 16th, 2019 and felt that he was "losing too much blood." He was found to have a Hb of 3.8 and K 2.7. He was given 2 units of PRBCs and supplemental K. He was admitted to the hospital with the working diagnosis of acute blood loss anemia secondary to gross hematuria.   Subjective: Patient is doing well and continues with no new physical complaints. He does report feeling some anxiety related to the intact 3 way catheter and CBI and feels it is preventing him from urinating well. He had two episodes last night where the foley was leaking and manual irrigation of clots was required. No fevers/chills, NVD, abdominal pain, CP, SOB or dizziness.   Assessment & Plan:  Acute Blood Loss Anemia secondary to Gross Hematuria  - Patient received 3 units PRBCs on 8/12 with good response. Hb today is up to 7.9.  - Monitor with daily CBC.  Gross Hematuria  - US Renal shows polypoid lesion in bladder, unclear if from prostate or bladder itself. Suspicious for malignancy. - Following with urology. Will discuss with them whether they plan for cystoscopy inpatient or outpatient. - Continue with CBI and clot removal as needed until urine returns to normal color. Monitor I and Os.  Enlarged Prostate  - 13.2 cm on CT in 04/2017. PSA 8/12 was 38, possibly from blood. Continue proscar 5mg . - Will need to be rechecked and followed up with urology as an outpatient.    Hypokalemia  - Mg normal. K is stable at 3.4 today. Supplemental K as needed.  Situational nxiety  - Hydroxyzine added prn for situational anxiety   Hypertension   - BP stable. Continue HCTZ 25mg   Dependent Edema - Added maintenance 20mg  PO Lasix   DVT prophylaxis: SCDs Code Status: FULL Family Communication: No family at bedside Disposition Plan: Unclear, pending urology work-up and clinical improvement.  Consultants:   Urology  Procedures:   None  Antimicrobials:   None   Objective: Vitals:   01/14/18 1719 01/14/18 1750 01/14/18 2008 01/15/18 0555  BP: 128/76 136/84 111/70 121/66  Pulse: 83 83 72 (!) 53  Resp: 16 18 18 18   Temp: 98.2 F (36.8 C) 98.6 F (37 C) 98.7 F (37.1 C) 98.2 F (36.8 C)  TempSrc: Oral Axillary Oral Oral  SpO2: 100% 100% 100% 97%  Weight:      Height:        Intake/Output Summary (Last 24 hours) at 01/15/2018 0834 Last data filed at 01/15/2018 0805 Gross per 24 hour  Intake 4593.5 ml  Output 8000 ml  Net -3406.5 ml   Filed Weights   01/13/18 2356  Weight: 86.2 kg    Examination: General appearance: adult male, awake and alert laying in bed NAD.   HEENT: Anicteric, dysconjugate gaze, lids and lashes normal. No nasal deformity, discharge, epistaxis. Skin: Warm and dry. No jaundice. No suspicious rashes or lesions. Cardiac: RRR, nl S1-S2, no murmurs appreciated. No JVD. Trace  edema noted RLE, no edema noted LLE No cyanosis. Distal pulses are intact bilaterally. Respiratory: Normal respiratory rate and rhythm. CTAB without wheezes, crackles, or rales. Abdomen: Abdomen soft and non-distended. No TTP. Positive bowel sounds throughout. GU: Three way catheter in place for CBI with minimal leaking noted. Neuro: AOx3. Moves all extremities. Speech fluent.    Psych: Attention normal. Affect normal. Judgment and insight appear normal.  Data Reviewed: I have personally reviewed following labs and imaging studies:  CBC: Recent Labs   Lab 2018-01-29 0018 2018-01-29 1413 01/15/18 0437  WBC 5.4  --  8.5  NEUTROABS 3.2  --   --   HGB 3.8* 6.9* 7.9*  HCT 12.5* 21.7* 24.4*  MCV 73.1*  --  77.5*  PLT 261  --  626   Basic Metabolic Panel: Recent Labs  Lab 2018-01-29 0018 2018/01/29 1413 01/15/18 0437  NA 137  --  142  K 2.7* 3.0* 3.4*  CL 103  --  106  CO2 24  --  25  GLUCOSE 105*  --  96  BUN 14  --  12  CREATININE 1.07  --  0.84  CALCIUM 8.3*  --  8.1*  MG  --   --  2.1   GFR: Estimated Creatinine Clearance: 86.1 mL/min (by C-G formula based on SCr of 0.84 mg/dL).  Urine analysis:    Component Value Date/Time   COLORURINE RED (A) January 29, 2018 0100   APPEARANCEUR TURBID (A) January 29, 2018 0100   LABSPEC  01/29/18 0100    TEST NOT REPORTED DUE TO COLOR INTERFERENCE OF URINE PIGMENT   PHURINE  2018/01/29 0100    TEST NOT REPORTED DUE TO COLOR INTERFERENCE OF URINE PIGMENT   GLUCOSEU (A) 2018/01/29 0100    TEST NOT REPORTED DUE TO COLOR INTERFERENCE OF URINE PIGMENT   HGBUR (A) Jan 29, 2018 0100    TEST NOT REPORTED DUE TO COLOR INTERFERENCE OF URINE PIGMENT   BILIRUBINUR (A) 01/29/2018 0100    TEST NOT REPORTED DUE TO COLOR INTERFERENCE OF URINE PIGMENT   KETONESUR (A) 01/29/2018 0100    TEST NOT REPORTED DUE TO COLOR INTERFERENCE OF URINE PIGMENT   PROTEINUR (A) January 29, 2018 0100    TEST NOT REPORTED DUE TO COLOR INTERFERENCE OF URINE PIGMENT   UROBILINOGEN 0.2 12/27/2012 2241   NITRITE (A) 01-29-18 0100    TEST NOT REPORTED DUE TO COLOR INTERFERENCE OF URINE PIGMENT   LEUKOCYTESUR (A) January 29, 2018 0100    TEST NOT REPORTED DUE TO COLOR INTERFERENCE OF URINE PIGMENT   Recent Results (from the past 240 hour(s))  Urine culture     Status: Abnormal   Collection Time: 2018-01-29  1:00 AM  Result Value Ref Range Status   Specimen Description   Final    URINE, RANDOM Performed at [facility], [place] [address] [place], [place] [address]    Special Requests   Final    NONE Performed at [facility], [place] [address], [place], [place] [address]    Culture (A)  Final    <10,000 COLONIES/mL INSIGNIFICANT GROWTH Performed at [place] [address], [place], [place] [address]    Report Status 01/15/2018 FINAL  Final    Radiology Studies: US Renal  Result Date: 01-29-18 CLINICAL DATA:  Hematuria. EXAM: RENAL / URINARY TRACT ULTRASOUND COMPLETE COMPARISON:  CT abdomen and pelvis 04/14/2017 FINDINGS: Right Kidney: Length: 11.8 cm. Multiple renal cysts are demonstrated. Largest measures 5.8 cm maximal diameter. No hydronephrosis and no solid masses  identified. Left Kidney: Length: 12.3 cm. Multiple renal cysts are demonstrated. Largest measures about 5.3 cm maximal diameter. No solid mass or hydronephrosis identified. Bladder: Hyperechoic structure inferior to the bladder measuring 8.2 x 10.9 cm in diameter. Correlating with the previous CT, this likely represents a prominent enlarged prostate gland. There is prostatic impression on the bladder base. The bladder is incompletely distended, limiting evaluation. There is suggestion of some polypoid filling defect in the bladder. This could be a lobular arising from the prostate gland or it could represent intrinsic bladder lesion such as transitional cell epithelioma or it could represent a blood clot. Consider cystoscopy for further evaluation if clinically indicated. IMPRESSION: 1. Bilateral renal cysts.  No hydronephrosis. 2. Marked enlargement of the prostate gland. 3. Bladder is incompletely distended, limiting evaluation. Suggestion of polypoid lesion in the bladder which could represent a lobule of the prostate gland protruding into the bladder or an intrinsic bladder lesion. Consider cystoscopy for further evaluation depending on clinical indications. Electronically Signed   By: Lucienne Capers M.D.   On: 01/14/2018 04:43   Scheduled Meds: . sodium chloride   Intravenous Once  . brimonidine  1 drop Both Eyes BID   . dorzolamide-timolol  1 drop Both Eyes BID  . finasteride  5 mg Oral Daily  . folic acid  578 mcg Oral Daily  . hydrochlorothiazide  25 mg Oral Daily  . magnesium oxide  200 mg Oral Daily  . pilocarpine  1 drop Right Eye QID  . pyridOXINE  100 mg Oral q morning - 10a  . vitamin B-12  1,000 mcg Oral q morning - 10a  . vitamin C  2,000 mg Oral q morning - 10a   Continuous Infusions: . sodium chloride irrigation      LOS: 1 day   [place], PA-S [facility] 01/15/2018, 8:34 AM   [email or web address] Password [email or web address] If 7PM-7AM, please contact night-coverage

## 2018-01-15 NOTE — Progress Notes (Addendum)
Patient leaking around catheter at change of shift and hand irrigated with normal saline. Urine very bloody with multiple clots. Patient continued leaking around the cath and soaked through pad and bed sheet hand irrigated again for over an over with continuous multiple clots. Large, small, and stringy in size. MD on call was notified and new order received to give a one time dose of Ditropan and to start CBI if needed.

## 2018-01-15 NOTE — Progress Notes (Signed)
Urology Progress Note   Subjective: Patient with leakage around 3-way Foley catheter overnight, nursing hand-irrigating large amount of clot, slow drip CBI initiated. Catheter no longer irrigating well.  Objective: Vital signs in last 24 hours: Temp:  [98 F (36.7 C)-98.7 F (37.1 C)] 98.2 F (36.8 C) (08/13 0555) Pulse Rate:  [53-83] 53 (08/13 0555) Resp:  [16-18] 18 (08/13 0555) BP: (111-136)/(64-84) 121/66 (08/13 0555) SpO2:  [97 %-100 %] 97 % (08/13 0555)  Intake/Output from previous day: 08/12 0701 - 08/13 0700 In: 4593.5 [P.O.:470; Blood:1123.5] Out: 6950 [Urine:6950] Intake/Output this shift: Total I/O In: -  Out: 1050 [Urine:1050]  Physical Exam:  General: Alert and oriented CV: RRR Lungs: Clear Abdomen: Soft, non-tender, non-distended GU: 22Fr 3-way Foley in place draining watermelon color urine with some clot in tubing on slow drip CBI. Ext: NT, No erythema  Lab Results: Recent Labs    01/14/18 0018 01/14/18 1413 01/15/18 0437  HGB 3.8* 6.9* 7.9*  HCT 12.5* 21.7* 24.4*   BMET Recent Labs    01/14/18 0018 01/14/18 1413 01/15/18 0437  NA 137  --  142  K 2.7* 3.0* 3.4*  CL 103  --  106  CO2 24  --  25  GLUCOSE 105*  --  96  BUN 14  --  12  CREATININE 1.07  --  0.84  CALCIUM 8.3*  --  8.1*     Studies/Results: US Renal  Result Date: 01/14/2018 CLINICAL DATA:  Hematuria. EXAM: RENAL / URINARY TRACT ULTRASOUND COMPLETE COMPARISON:  CT abdomen and pelvis 04/14/2017 FINDINGS: Right Kidney: Length: 11.8 cm. Multiple renal cysts are demonstrated. Largest measures 5.8 cm maximal diameter. No hydronephrosis and no solid masses identified. Left Kidney: Length: 12.3 cm. Multiple renal cysts are demonstrated. Largest measures about 5.3 cm maximal diameter. No solid mass or hydronephrosis identified. Bladder: Hyperechoic structure inferior to the bladder measuring 8.2 x 10.9 cm in diameter. Correlating with the previous CT, this likely represents a prominent  enlarged prostate gland. There is prostatic impression on the bladder base. The bladder is incompletely distended, limiting evaluation. There is suggestion of some polypoid filling defect in the bladder. This could be a lobular arising from the prostate gland or it could represent intrinsic bladder lesion such as transitional cell epithelioma or it could represent a blood clot. Consider cystoscopy for further evaluation if clinically indicated. IMPRESSION: 1. Bilateral renal cysts.  No hydronephrosis. 2. Marked enlargement of the prostate gland. 3. Bladder is incompletely distended, limiting evaluation. Suggestion of polypoid lesion in the bladder which could represent a lobule of the prostate gland protruding into the bladder or an intrinsic bladder lesion. Consider cystoscopy for further evaluation depending on clinical indications. Electronically Signed   By: Lucienne Capers M.D.   On: 01/14/2018 04:43    Assessment/Plan:  72 y.o. male with gross hematuria with acute blood loss anemia with massive BPH with BOO and elevated PSA (38.2 on 01/14/2018).  Hgb improved to 7.9 after 3u pRBC.  - 3-way Foley to be exchange this am by nursing, will reassess need for CBI afterwards - Continue finasteride - Patient will need cystoscopy in near future to rule out bladder malignancy - PSA is more than double over the past year, but has been high.  The acute rise could be associated with the bleeding event and will need to be reassessed at a later date given obvious possibility of prostate cancer. Pending further workup, may benefit from surgical management based on size of prostate alone.  Dispo: Floor   LOS: 1 day   Case Rob Bunting 01/15/2018, 8:18 AM

## 2018-01-15 NOTE — Progress Notes (Deleted)
Pt is active with [facility] for NA/PCS. Pt called [facility] while I was in the room, trying to keep his hours. I explained that I could not increase his hours to pt and RN with [facility]. Pt will continue at discharge. Will continue to follow for discharge.

## 2018-01-16 ENCOUNTER — Other Ambulatory Visit: Payer: Self-pay | Admitting: Urology

## 2018-01-16 DIAGNOSIS — R319 Hematuria, unspecified: Secondary | ICD-10-CM

## 2018-01-16 DIAGNOSIS — E876 Hypokalemia: Secondary | ICD-10-CM

## 2018-01-16 LAB — IRON AND TIBC
Iron: 21 ug/dL — ABNORMAL LOW (ref 45–182)
Saturation Ratios: 5 % — ABNORMAL LOW (ref 17.9–39.5)
TIBC: 439 ug/dL (ref 250–450)
UIBC: 418 ug/dL

## 2018-01-16 LAB — CBC WITH DIFFERENTIAL/PLATELET
Basophils Absolute: 0 10*3/uL (ref 0.0–0.1)
Basophils Relative: 0 %
Eosinophils Absolute: 0.1 10*3/uL (ref 0.0–0.7)
Eosinophils Relative: 1 %
HCT: 23.6 % — ABNORMAL LOW (ref 39.0–52.0)
Hemoglobin: 7.7 g/dL — ABNORMAL LOW (ref 13.0–17.0)
Lymphocytes Relative: 19 %
Lymphs Abs: 1.7 10*3/uL (ref 0.7–4.0)
MCH: 25.4 pg — ABNORMAL LOW (ref 26.0–34.0)
MCHC: 32.6 g/dL (ref 30.0–36.0)
MCV: 77.9 fL — ABNORMAL LOW (ref 78.0–100.0)
Monocytes Absolute: 0.6 10*3/uL (ref 0.1–1.0)
Monocytes Relative: 7 %
Neutro Abs: 6.7 10*3/uL (ref 1.7–7.7)
Neutrophils Relative %: 73 %
Platelets: 208 10*3/uL (ref 150–400)
RBC: 3.03 MIL/uL — ABNORMAL LOW (ref 4.22–5.81)
RDW: 20 % — ABNORMAL HIGH (ref 11.5–15.5)
WBC: 9.2 10*3/uL (ref 4.0–10.5)

## 2018-01-16 LAB — BASIC METABOLIC PANEL
Anion gap: 8 (ref 5–15)
BUN: 12 mg/dL (ref 8–23)
CO2: 27 mmol/L (ref 22–32)
Calcium: 8.1 mg/dL — ABNORMAL LOW (ref 8.9–10.3)
Chloride: 107 mmol/L (ref 98–111)
Creatinine, Ser: 0.81 mg/dL (ref 0.61–1.24)
GFR calc Af Amer: >60 mL/min (ref >60)
GFR calc non Af Amer: >60 mL/min (ref >60)
Glucose, Bld: 99 mg/dL (ref 70–99)
Potassium: 4.1 mmol/L (ref 3.5–5.1)
Sodium: 142 mmol/L (ref 135–145)

## 2018-01-16 LAB — RETICULOCYTES
RBC.: 3.03 MIL/uL — ABNORMAL LOW (ref 4.22–5.81)
Retic Count, Absolute: 106.1 10*3/uL (ref 19.0–186.0)
Retic Ct Pct: 3.5 % — ABNORMAL HIGH (ref 0.4–3.1)

## 2018-01-16 LAB — PREPARE RBC (CROSSMATCH)

## 2018-01-16 LAB — FOLATE: Folate: 10.3 ng/mL (ref >5.9)

## 2018-01-16 LAB — FERRITIN: Ferritin: 12 ng/mL — ABNORMAL LOW (ref 24–336)

## 2018-01-16 LAB — VITAMIN B12: Vitamin B-12: 1000 pg/mL — ABNORMAL HIGH (ref 180–914)

## 2018-01-16 MED ORDER — FUROSEMIDE 10 MG/ML IJ SOLN
20.0000 mg | Freq: Once | INTRAMUSCULAR | Status: AC
  Administered 2018-01-16: 20 mg via INTRAVENOUS
  Filled 2018-01-16: qty 2

## 2018-01-16 NOTE — Progress Notes (Signed)
Upon reassessment this RN found a significant amount of leakage around the tip of the pt penis at foley cath. RN hand irrigated foley with 77ml of NS but was unsuccessful. Bladder scanner read 368ml. Urologist on call paged. Instructions given to stop CBI. Will continue to monitor closely.

## 2018-01-16 NOTE — Progress Notes (Signed)
Successfully re-place 24 F cath 30cc balloon under sterile technique. Pt tolerated procedure well. Blood tinge urine returned with cloths noted. Irrigation @ moderate flow. Will cont to monitor. SRP, RN

## 2018-01-16 NOTE — Anesthesia Preprocedure Evaluation (Addendum)
Anesthesia Evaluation  Patient identified by MRN, date of birth, ID band Patient awake    Reviewed: Allergy & Precautions, NPO status , Patient's Chart, lab work & pertinent test results  Airway Mallampati: I       Dental no notable dental hx. (+) Teeth Intact   Pulmonary    Pulmonary exam normal breath sounds clear to auscultation       Cardiovascular hypertension, Pt. on medications Normal cardiovascular exam Rhythm:Regular Rate:Normal     Neuro/Psych    GI/Hepatic negative GI ROS, Neg liver ROS,   Endo/Other  negative endocrine ROS  Renal/GU negative Renal ROS Bladder dysfunction      Musculoskeletal negative musculoskeletal ROS (+)   Abdominal Normal abdominal exam  (+)   Peds  Hematology  (+) anemia ,   Anesthesia Other Findings   Reproductive/Obstetrics                            Anesthesia Physical Anesthesia Plan  ASA: II  Anesthesia Plan: General   Post-op Pain Management:    Induction: Intravenous  PONV Risk Score and Plan: 4 or greater and Ondansetron and Dexamethasone  Airway Management Planned: LMA  Additional Equipment:   Intra-op Plan:   Post-operative Plan: Extubation in OR  Informed Consent: I have reviewed the patients History and Physical, chart, labs and discussed the procedure including the risks, benefits and alternatives for the proposed anesthesia with the patient or authorized representative who has indicated his/her understanding and acceptance.   Dental advisory given  Plan Discussed with: CRNA and Surgeon  Anesthesia Plan Comments:        Anesthesia Quick Evaluation

## 2018-01-16 NOTE — Progress Notes (Signed)
PROGRESS NOTE [facility]   Nicandro Sumner Boast.   WUJ:[id number] DOB: 1946/01/03  DOA: 01/14/2018 PCP: No primary care provider on file.   Brief Narrative:  [name] [name]. 72 year old male with past medical history of hypertension and legally blind who presented to the emergency department complaining of blood in the urine. Patient report having hematuria for about 5 years, has not been seen by urology in a while. Patient came to the ED because he was losing too much blood. Upon ED evaluation hemoglobin was found to be 3.8, potassium 2.7. Patient was admitted with working diagnosis of gross hematuria.  Subjective: Patient seen and examined, continue to have hematuria and blood clots. Urology planning for cystoscopy in AM.   Assessment & Plan: Blood loss anemia due to gross hematuria Status post 3 units of PRBCs on 8/12 with good response, however continues to bleed. Hgb 7.7 today, will transfuse another unit. Continue to monitor CBC closely   Gross hematuria Unclear etiology at this time, however high suspicious for malignancy. With polypoid lesion in the bladder this could represent bladder cancer. Urology recommendations appreciated - planning for cystoscopy in AM keep NPO after midnight. Also with elevated PSA.  Continue CBI for now.  Enlarged prostate History of elevated PSA, continue Proscar. Increasing PSA could be related to blood irritation. Per urology.   Hypokalemia Resolved   Hypertension Stable, continue current management   Chronic venous stasis Responding to Lasix   DVT prophylaxis: SCD's  Code Status: Full Code  Family Communication: None at bedside  Disposition Plan: Home when urology w/u is completed   Consultants:   Urology   Procedures:   None   Antimicrobials:  None     Objective: Vitals:   01/16/18 0415 01/16/18 1230 01/16/18 1300 01/16/18 1521  BP:  (!) 143/82  135/84  Pulse:  76 66 72  Resp:  16 14 18   Temp:  99.1  F (37.3 C) 98.4 F (36.9 C) 98.3 F (36.8 C)  TempSrc:  Oral Oral Oral  SpO2: 100% 100% 100% 93%  Weight:      Height:        Intake/Output Summary (Last 24 hours) at 01/16/2018 1758 Last data filed at 01/16/2018 1513 Gross per 24 hour  Intake 362.5 ml  Output 10600 ml  Net -10237.5 ml   Filed Weights   01/13/18 2356  Weight: 86.2 kg    Examination:  General: NAD Cardiovascular: RRR, S1/S2 +, no rubs, no gallops Respiratory: CTA bilaterally, no wheezing, no rhonchi Abdominal: Soft, NT, ND, bowel sounds + GU: Foley in place urine red with multiple clots  Extremities: Trace edema improved   Data Reviewed: I have personally reviewed following labs and imaging studies  CBC: Recent Labs  Lab 01/14/18 0018 01/14/18 1413 01/15/18 0437 01/16/18 0435  WBC 5.4  --  8.5 9.2  NEUTROABS 3.2  --   --  6.7  HGB 3.8* 6.9* 7.9* 7.7*  HCT 12.5* 21.7* 24.4* 23.6*  MCV 73.1*  --  77.5* 77.9*  PLT 261  --  228 956   Basic Metabolic Panel: Recent Labs  Lab 01/14/18 0018 01/14/18 1413 01/15/18 0437 01/16/18 0435  NA 137  --  142 142  K 2.7* 3.0* 3.4* 4.1  CL 103  --  106 107  CO2 24  --  25 27  GLUCOSE 105*  --  96 99  BUN 14  --  12 12  CREATININE 1.07  --  0.84 0.81  CALCIUM 8.3*  --  8.1* 8.1*  MG  --   --  2.1  --    GFR: Estimated Creatinine Clearance: 89.3 mL/min (by C-G formula based on SCr of 0.81 mg/dL). Liver Function Tests: No results for input(s): [name], ALT, ALKPHOS, BILITOT, PROT, ALBUMIN in the last 168 hours. No results for input(s): LIPASE, AMYLASE in the last 168 hours. No results for input(s): AMMONIA in the last 168 hours. Coagulation Profile: No results for input(s): INR, PROTIME in the last 168 hours. Cardiac Enzymes: No results for input(s): CKTOTAL, CKMB, CKMBINDEX, TROPONINI in the last 168 hours. BNP (last 3 results) No results for input(s): PROBNP in the last 8760 hours. HbA1C: No results for input(s): HGBA1C in the last 72  hours. CBG: No results for input(s): GLUCAP in the last 168 hours. Lipid Profile: No results for input(s): CHOL, HDL, LDLCALC, TRIG, CHOLHDL, LDLDIRECT in the last 72 hours. Thyroid Function Tests: No results for input(s): TSH, T4TOTAL, FREET4, T3FREE, THYROIDAB in the last 72 hours. Anemia Panel: Recent Labs    01/16/18 0435  VITAMINB12 1,000*  FOLATE 10.3  FERRITIN 12*  TIBC 439  IRON 21*  RETICCTPCT 3.5*   Sepsis Labs: No results for input(s): PROCALCITON, LATICACIDVEN in the last 168 hours.  Recent Results (from the past 240 hour(s))  Urine culture     Status: Abnormal   Collection Time: 01/14/18  1:00 AM  Result Value Ref Range Status   Specimen Description   Final    URINE, RANDOM Performed at [facility] [address] [place], [place] [address]    Special Requests   Final    NONE Performed at [facility], [place] [address], [place], [place] [address]    Culture (A)  Final    <10,000 COLONIES/mL INSIGNIFICANT GROWTH Performed at [facility] [address] [place], [place] [address]    Report Status 01/15/2018 FINAL  Final      Radiology Studies: No results found.    Scheduled Meds: . brimonidine  1 drop Both Eyes BID  . dorzolamide-timolol  1 drop Both Eyes BID  . finasteride  5 mg Oral Daily  . folic acid  119 mcg Oral Daily  . furosemide  20 mg Oral Daily  . hydrochlorothiazide  25 mg Oral Daily  . magnesium oxide  200 mg Oral Daily  . pilocarpine  1 drop Right Eye QID  . pyridOXINE  100 mg Oral q morning - 10a  . vitamin B-12  1,000 mcg Oral q morning - 10a  . vitamin C  2,000 mg Oral q morning - 10a   Continuous Infusions: . sodium chloride irrigation       LOS: 2 days    Time spent: Total of 25 minutes spent with pt, greater than 50% of which was spent in discussion of  treatment, counseling and coordination of care    Chipper Oman, MD Pager: Text Page via [email or web address]   If 7PM-7AM,  please contact night-coverage [email or web address] 01/16/2018, 5:58 PM   Note - This record has been created using [employer]. Chart creation errors have been sought, but may not always have been located. Such creation errors do not reflect on the standard of medical care.

## 2018-01-17 ENCOUNTER — Encounter (HOSPITAL_COMMUNITY)
Admission: EM | Disposition: A | Discharge status: Home / Self Care | Payer: Self-pay | Source: Home / Self Care | Attending: Family Medicine

## 2018-01-17 ENCOUNTER — Inpatient Hospital Stay (HOSPITAL_COMMUNITY): Payer: Medicare Other | Admitting: Anesthesiology

## 2018-01-17 HISTORY — PX: CYSTOSCOPY WITH FULGERATION: SHX6638

## 2018-01-17 LAB — BASIC METABOLIC PANEL
Anion gap: 9 (ref 5–15)
BUN: 13 mg/dL (ref 8–23)
CO2: 28 mmol/L (ref 22–32)
Calcium: 7.9 mg/dL — ABNORMAL LOW (ref 8.9–10.3)
Chloride: 105 mmol/L (ref 98–111)
Creatinine, Ser: 0.83 mg/dL (ref 0.61–1.24)
GFR calc Af Amer: >60 mL/min (ref >60)
GFR calc non Af Amer: >60 mL/min (ref >60)
Glucose, Bld: 90 mg/dL (ref 70–99)
Potassium: 4.1 mmol/L (ref 3.5–5.1)
Sodium: 142 mmol/L (ref 135–145)

## 2018-01-17 LAB — TYPE AND SCREEN
ABO/RH(D): B POS
Antibody Screen: NEGATIVE
Unit division: 0
Unit division: 0
Unit division: 0
Unit division: 0

## 2018-01-17 LAB — BPAM RBC
Blood Product Expiration Date: 201909062359
Blood Product Expiration Date: 201909072359
Blood Product Expiration Date: 201909072359
Blood Product Expiration Date: 201909072359
ISSUE DATE / TIME: 201908120545
ISSUE DATE / TIME: 201908121013
ISSUE DATE / TIME: 201908121729
ISSUE DATE / TIME: 201908141227
Unit Type and Rh: 7300
Unit Type and Rh: 7300
Unit Type and Rh: 7300
Unit Type and Rh: 7300

## 2018-01-17 LAB — CBC WITH DIFFERENTIAL/PLATELET
Basophils Absolute: 0 10*3/uL (ref 0.0–0.1)
Basophils Relative: 1 %
Eosinophils Absolute: 0.2 10*3/uL (ref 0.0–0.7)
Eosinophils Relative: 2 %
HCT: 24.7 % — ABNORMAL LOW (ref 39.0–52.0)
Hemoglobin: 7.9 g/dL — ABNORMAL LOW (ref 13.0–17.0)
Lymphocytes Relative: 21 %
Lymphs Abs: 1.7 10*3/uL (ref 0.7–4.0)
MCH: 25.2 pg — ABNORMAL LOW (ref 26.0–34.0)
MCHC: 32 g/dL (ref 30.0–36.0)
MCV: 78.9 fL (ref 78.0–100.0)
Monocytes Absolute: 0.6 10*3/uL (ref 0.1–1.0)
Monocytes Relative: 7 %
Neutro Abs: 5.8 10*3/uL (ref 1.7–7.7)
Neutrophils Relative %: 69 %
Platelets: 187 10*3/uL (ref 150–400)
RBC: 3.13 MIL/uL — ABNORMAL LOW (ref 4.22–5.81)
RDW: 19.3 % — ABNORMAL HIGH (ref 11.5–15.5)
WBC: 8.2 10*3/uL (ref 4.0–10.5)

## 2018-01-17 LAB — SURGICAL PCR SCREEN
MRSA, PCR: NEGATIVE
Staphylococcus aureus: NEGATIVE

## 2018-01-17 LAB — MAGNESIUM: Magnesium: 2.1 mg/dL (ref 1.7–2.4)

## 2018-01-17 SURGERY — CYSTOSCOPY, WITH BLADDER FULGURATION
Anesthesia: General

## 2018-01-17 MED ORDER — 0.9 % SODIUM CHLORIDE (POUR BTL) OPTIME
TOPICAL | Status: DC | PRN
  Administered 2018-01-17: 1000 mL

## 2018-01-17 MED ORDER — PHENYLEPHRINE HCL 10 MG/ML IJ SOLN
INTRAMUSCULAR | Status: AC
  Filled 2018-01-17: qty 1

## 2018-01-17 MED ORDER — DEXAMETHASONE SODIUM PHOSPHATE 10 MG/ML IJ SOLN
INTRAMUSCULAR | Status: AC
  Filled 2018-01-17: qty 1

## 2018-01-17 MED ORDER — PHENYLEPHRINE 40 MCG/ML (10ML) SYRINGE FOR IV PUSH (FOR BLOOD PRESSURE SUPPORT)
PREFILLED_SYRINGE | INTRAVENOUS | Status: DC | PRN
  Administered 2018-01-17: 80 ug via INTRAVENOUS
  Administered 2018-01-17 (×2): 120 ug via INTRAVENOUS

## 2018-01-17 MED ORDER — LACTATED RINGERS IV SOLN
INTRAVENOUS | Status: DC | PRN
  Administered 2018-01-17: 07:00:00 via INTRAVENOUS

## 2018-01-17 MED ORDER — PHENYLEPHRINE 40 MCG/ML (10ML) SYRINGE FOR IV PUSH (FOR BLOOD PRESSURE SUPPORT)
PREFILLED_SYRINGE | INTRAVENOUS | Status: AC
  Filled 2018-01-17: qty 10

## 2018-01-17 MED ORDER — PROPOFOL 10 MG/ML IV BOLUS
INTRAVENOUS | Status: DC | PRN
  Administered 2018-01-17: 60 mg via INTRAVENOUS
  Administered 2018-01-17: 140 mg via INTRAVENOUS

## 2018-01-17 MED ORDER — SODIUM CHLORIDE 0.9 % IR SOLN
Status: DC | PRN
  Administered 2018-01-17: 9000 mL

## 2018-01-17 MED ORDER — ONDANSETRON HCL 4 MG/2ML IJ SOLN
INTRAMUSCULAR | Status: AC
  Filled 2018-01-17: qty 2

## 2018-01-17 MED ORDER — FENTANYL CITRATE (PF) 100 MCG/2ML IJ SOLN
INTRAMUSCULAR | Status: DC | PRN
  Administered 2018-01-17: 50 ug via INTRAVENOUS
  Administered 2018-01-17 (×2): 25 ug via INTRAVENOUS

## 2018-01-17 MED ORDER — SODIUM CHLORIDE 0.9 % IV SOLN
INTRAVENOUS | Status: AC
  Administered 2018-01-17: 23:00:00 via INTRAVENOUS

## 2018-01-17 MED ORDER — FENTANYL CITRATE (PF) 100 MCG/2ML IJ SOLN
INTRAMUSCULAR | Status: AC
  Filled 2018-01-17: qty 2

## 2018-01-17 MED ORDER — PROPOFOL 10 MG/ML IV BOLUS
INTRAVENOUS | Status: AC
Start: 2018-01-17 — End: ?
  Filled 2018-01-17: qty 20

## 2018-01-17 MED ORDER — BELLADONNA ALKALOIDS-OPIUM 16.2-30 MG RE SUPP
RECTAL | Status: AC
  Filled 2018-01-17: qty 1

## 2018-01-17 MED ORDER — ONDANSETRON HCL 4 MG/2ML IJ SOLN
INTRAMUSCULAR | Status: DC | PRN
  Administered 2018-01-17: 4 mg via INTRAVENOUS

## 2018-01-17 MED ORDER — HYDROMORPHONE HCL 1 MG/ML IJ SOLN
INTRAMUSCULAR | Status: AC
  Filled 2018-01-17: qty 1

## 2018-01-17 MED ORDER — STERILE WATER FOR IRRIGATION IR SOLN
Status: DC | PRN
  Administered 2018-01-17: 3000 mL

## 2018-01-17 MED ORDER — MEPERIDINE HCL 50 MG/ML IJ SOLN: 6.2500 mg | INTRAMUSCULAR | Status: DC | PRN

## 2018-01-17 MED ORDER — PHENYLEPHRINE HCL 10 MG/ML IJ SOLN
INTRAMUSCULAR | Status: DC | PRN
  Administered 2018-01-17: 25 ug/min via INTRAVENOUS

## 2018-01-17 MED ORDER — PROMETHAZINE HCL 25 MG/ML IJ SOLN: 6.2500 mg | INTRAMUSCULAR | Status: DC | PRN

## 2018-01-17 MED ORDER — POLYSACCHARIDE IRON COMPLEX 150 MG PO CAPS
150.0000 mg | ORAL_CAPSULE | Freq: Every day | ORAL | Status: DC
  Administered 2018-01-17 – 2018-01-18 (×2): 150 mg via ORAL
  Filled 2018-01-17 (×2): qty 1

## 2018-01-17 MED ORDER — LIDOCAINE 2% (20 MG/ML) 5 ML SYRINGE
INTRAMUSCULAR | Status: AC
  Filled 2018-01-17: qty 5

## 2018-01-17 MED ORDER — HYDROMORPHONE HCL 1 MG/ML IJ SOLN
0.2500 mg | INTRAMUSCULAR | Status: DC | PRN
  Administered 2018-01-17 (×2): 0.5 mg via INTRAVENOUS

## 2018-01-17 MED ORDER — ACETAMINOPHEN 10 MG/ML IV SOLN: 1000.0000 mg | Freq: Once | INTRAVENOUS | Status: DC | PRN

## 2018-01-17 MED ORDER — CEFAZOLIN SODIUM-DEXTROSE 2-4 GM/100ML-% IV SOLN
2.0000 g | Freq: Once | INTRAVENOUS | Status: AC
  Administered 2018-01-17: 2 g via INTRAVENOUS
  Filled 2018-01-17: qty 100

## 2018-01-17 MED ORDER — DEXAMETHASONE SODIUM PHOSPHATE 10 MG/ML IJ SOLN
INTRAMUSCULAR | Status: DC | PRN
  Administered 2018-01-17: 10 mg via INTRAVENOUS

## 2018-01-17 SURGICAL SUPPLY — 19 items
BAG URINE DRAINAGE (UROLOGICAL SUPPLIES) ×2 IMPLANT
BAG URO CATCHER STRL LF (MISCELLANEOUS) ×3 IMPLANT
CATH FOLEY 3WAY 30CC 22FR (CATHETERS) IMPLANT
CATH FOLEY 3WAY 30CC 24FR (CATHETERS) ×2
CATH URET 5FR 28IN OPEN ENDED (CATHETERS) IMPLANT
CATH URTH STD 24FR FL 3W 2 (CATHETERS) IMPLANT
GLOVE SURG SS PI 8.0 STRL IVOR (GLOVE) ×2 IMPLANT
GOWN STRL REUS W/TWL XL LVL3 (GOWN DISPOSABLE) ×3 IMPLANT
HOLDER FOLEY CATH W/STRAP (MISCELLANEOUS) ×2 IMPLANT
LOOP CUT BIPOLAR 24F LRG (ELECTROSURGICAL) ×2 IMPLANT
MANIFOLD NEPTUNE II (INSTRUMENTS) ×3 IMPLANT
PACK CYSTO (CUSTOM PROCEDURE TRAY) ×3 IMPLANT
SET ASPIRATION TUBING (TUBING) ×3 IMPLANT
SUT ETHILON 3 0 PS 1 (SUTURE) IMPLANT
SYR 30ML LL (SYRINGE) ×2 IMPLANT
SYRINGE IRR TOOMEY STRL 70CC (SYRINGE) ×2 IMPLANT
TUBING CONNECTING 10 (TUBING) ×2 IMPLANT
TUBING CONNECTING 10' (TUBING) ×1
TUBING UROLOGY SET (TUBING) ×3 IMPLANT

## 2018-01-17 NOTE — Discharge Instructions (Signed)
CYSTOSCOPY HOME CARE INSTRUCTIONS  Activity: Rest for the remainder of the day.  Do not drive or operate equipment today.  You may resume normal activities in one to two days as instructed by your physician.   Meals: Drink plenty of liquids and eat light foods such as gelatin or soup this evening.  You may return to a normal meal plan tomorrow.  Return to Work: You may return to work in one to two days or as instructed by your physician.  Special Instructions / Symptoms: Call your physician if any of these symptoms occur:   -persistent or heavy bleeding  -bleeding which continues after first few urination  -large blood clots that are difficult to pass  -urine stream diminishes or stops completely  -fever equal to or higher than 101 degrees Farenheit.  -cloudy urine with a strong, foul odor  -severe pain  Females should always wipe from front to back after elimination.  You may feel some burning pain when you urinate.  This should disappear with time.  Applying moist heat to the lower abdomen or a hot tub bath may help relieve the pain. \  The office will call to arrange a follow appointment for 2-3 weeks.    Patient Signature:  ________________________________________________________  Nurse's Signature:  ________________________________________________________

## 2018-01-17 NOTE — Anesthesia Procedure Notes (Signed)
Procedure Name: LMA Insertion Date/Time: 01/17/2018 7:40 AM Performed by: Lind Covert, CRNA Pre-anesthesia Checklist: Patient identified, Emergency Drugs available, Suction available, Patient being monitored and Timeout performed Patient Re-evaluated:Patient Re-evaluated prior to induction Oxygen Delivery Method: Circle system utilized Preoxygenation: Pre-oxygenation with 100% oxygen Induction Type: IV induction LMA: LMA inserted LMA Size: 5.0 Number of attempts: 1 Placement Confirmation: positive ETCO2 and breath sounds checked- equal and bilateral Tube secured with: Tape Dental Injury: Teeth and Oropharynx as per pre-operative assessment

## 2018-01-17 NOTE — Progress Notes (Signed)
Patient ID: Aldean Ast., male   DOB: 1945/08/24, 72 y.o.   MRN: [id number]   Post op check  Herberto is doing well postop with clear urine on CBI.    I will have the foley removed tomorrow morning if the urine is clear and he could potentially be discharged on finasteride when voiding.    I will arrange follow up in my office in 2-3 weeks.      I will reassess the PSA elevation once he has recovered from this episode.

## 2018-01-17 NOTE — Interval H&P Note (Signed)
History and Physical Interval Note:  Mr. Conwell had the foley replaced yesterday and has been doing better with less bleeding and clots.  He did have to have the catheter hand irrigated twice last night.   Hgb is 7.9 after a second unit yesterday.   I will proceed with cystoscopy, clot evacuation and fulguration.  Risks reviewed in detail.   01/17/2018 7:24 AM  Annalee Genta Sumner Boast.  has presented today for surgery, with the diagnosis of BENIGN PROSTATIC HYPERPLASIA WIHT CLOTT RETENTION  The various methods of treatment have been discussed with the patient and family. After consideration of risks, benefits and other options for treatment, the patient has consented to  Procedure(s): [place] (N/A) as a surgical intervention .  The patient's history has been reviewed, patient examined, no change in status, stable for surgery.  I have reviewed the patient's chart and labs.  Questions were answered to the patient's satisfaction.     Irine Seal

## 2018-01-17 NOTE — Progress Notes (Signed)
Day of Surgery   Subjective/Chief Complaint:  1- Massive Prostate With Recurrent Hematuria / Clot Retention - 400gm prostate with large median lobe by CT and cysto 2019, recurrent gross hemarutia with retention. Cr <1.   Adtmitted 01/14/18 with clot retention, anemia. Transfused and placed on bladder irrigation which has clotted several times.   2 - Prostate Screening - PSA 15 2017 with PSA Density 0.04ng/mL (normal).   3 - Bilateral NON-Complex Renal Cysts - Rt lower 5cm, Rt lower 3cm, Rt upper 2cm, Lt mid 3cm non-compex cysts by contrast CT 2014, stable by imaging 2019.    PMH sig for legal blindness, lap appy, R inguinal hernia repair.   Today "[name] [name]" is still clotting off catheters on high flow irrigation. Hgb improving.    Objective: Vital signs in last 24 hours: Temp:  [97.9 F (36.6 C)-99.2 F (37.3 C)] 97.9 F (36.6 C) (08/15 0836) Pulse Rate:  [66-81] 72 (08/15 0915) Resp:  [11-18] 11 (08/15 0915) BP: (106-157)/(67-93) 140/85 (08/15 0915) SpO2:  [93 %-100 %] 100 % (08/15 0915) Last BM Date: 01/16/18  Intake/Output from previous day: 08/14 0701 - 08/15 0700 In: 32222.5 [Blood:362.5] Out: 09323 [FTDDU:20254] Intake/Output this shift: Total I/O In: 3500 [I.V.:500; Other:3000] Out: 1250 [Urine:1250]  General appearance: alert, appears stated age and extremely pleasant Eyes: negative Nose: Nares normal. Septum midline. Mucosa normal. No drainage or sinus tenderness. Throat: lips, mucosa, and tongue normal; teeth and gums normal Neck: supple, symmetrical, trachea midline Back: symmetric, no curvature. ROM normal. No CVA tenderness. Resp: non-labored on room air.  Cardio: Nl rate GI: soft, non-tender; bowel sounds normal; no masses,  no organomegaly Pelvic: 52F 3 way catheter in place clotted off. Replaced wtih 99F and resuemd irrigation.  Skin: Skin color, texture, turgor normal. No rashes or lesions Lymph nodes: Cervical, supraclavicular, and axillary nodes  normal. Neurologic: Grossly normal  Lab Results:  Recent Labs    01/16/18 0435 01/17/18 0438  WBC 9.2 8.2  HGB 7.7* 7.9*  HCT 23.6* 24.7*  PLT 208 187   BMET Recent Labs    01/16/18 0435 01/17/18 0438  NA 142 142  K 4.1 4.1  CL 107 105  CO2 27 28  GLUCOSE 99 90  BUN 12 13  CREATININE 0.81 0.83  CALCIUM 8.1* 7.9*   PT/INR No results for input(s): LABPROT, INR in the last 72 hours. ABG No results for input(s): PHART, HCO3 in the last 72 hours.  Invalid input(s): PCO2, PO2  Studies/Results: No results found.  Anti-infectives: Anti-infectives (From admission, onward)   Start     Dose/Rate Route Frequency Ordered Stop   01/17/18 0730  ceFAZolin (ANCEF) IVPB 2g/100 mL premix     2 g 200 mL/hr over 30 Minutes Intravenous  Once 01/17/18 0725 01/17/18 0742      Assessment/Plan:  1- Massive Prostate With Recurrent Hematuria / Clot Retention - NPO p MN fo rlikely cysto / fulgeration tomorrow, I am concerned he will not be able to get off irrigation without this. Agree with starting finasteride as well.   2 - Prostate Screening - up todate date with normal psa density, this is likely due tohis massive BPH.   3 - Bilateral NON-Complex Renal Cysts - no indication for further evaluation or surveillance.

## 2018-01-17 NOTE — Transfer of Care (Signed)
Immediate Anesthesia Transfer of Care Note  Patient: [name] [name].  Procedure(s) Performed: CYSTOSCOPY CLOT EVACUATION WITH FULGERATION TUR BIOPSY OF BLADDER NECK (N/A )  Patient Location: PACU  Anesthesia Type:General  Level of Consciousness: sedated  Airway & Oxygen Therapy: Patient Spontanous Breathing and Patient connected to face mask oxygen  Post-op Assessment: Report given to RN and Post -op Vital signs reviewed and stable  Post vital signs: Reviewed and stable  Last Vitals:  Vitals Value Taken Time  BP 154/109 01/17/2018  8:37 AM  Temp    Pulse 80 01/17/2018  8:39 AM  Resp 15 01/17/2018  8:39 AM  SpO2 100 % 01/17/2018  8:39 AM  Vitals shown include unvalidated device data.  Last Pain:  Vitals:   01/17/18 0448  TempSrc: Oral  PainSc:          Complications: No apparent anesthesia complications

## 2018-01-17 NOTE — Plan of Care (Signed)
  Problem: Health Behavior/Discharge Planning: Goal: Ability to manage health-related needs will improve Outcome: Progressing   Problem: Clinical Measurements: Goal: Ability to maintain clinical measurements within normal limits will improve Outcome: Progressing Goal: Will remain free from infection Outcome: Progressing Goal: Diagnostic test results will improve Outcome: Progressing Goal: Cardiovascular complication will be avoided Outcome: Progressing   Problem: Activity: Goal: Risk for activity intolerance will decrease Outcome: Progressing   Problem: Nutrition: Goal: Adequate nutrition will be maintained Outcome: Progressing   Problem: Elimination: Goal: Will not experience complications related to bowel motility Outcome: Progressing Goal: Will not experience complications related to urinary retention Outcome: Progressing   Problem: Safety: Goal: Ability to remain free from injury will improve Outcome: Progressing   Problem: Skin Integrity: Goal: Risk for impaired skin integrity will decrease Outcome: Progressing

## 2018-01-17 NOTE — Op Note (Signed)
Procedure: 1.  Cystoscopy with evacuation of clots and fulguration of bleeders. 2.  TUR biopsy of the bladder neck.  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  Postop diagnosis: Same with 1.5 cm papillary lesion of left anterior bladder neck.  Surgeon: Dr. Irine Seal.  Anesthesia: General.  Drain: 24 French three-way Foley catheter.  Specimen: TUR biopsy of bladder neck lesion.  EBL: Approximately 100 mL of clot otherwise minimal.  Complications: None.  Indications: The patient is a 72 year old African-American male who originally presented with gross hematuria and acute on chronic blood loss anemia with a hemoglobin of 3.8.  He required transfusion and catheter placement with irrigation, but the urine remained bloody and he repeatedly clotted off catheters.  It was felt that cystoscopy with clot evacuation fulguration was indicated.  Procedure: He was taken the operating room where he was given 2 g of Ancef.  A general anesthetic was induced.  He was placed in lithotomy position.  He was fitted with PAS hose.  He was prepped with Betadine solution and draped in usual sterile fashion.  Cystoscopy was performed using a 23 [place] scope and 30 degree lens.  Examination revealed a normal urethra.  The external sphincter was intact.  The prostatic urethra was markedly elongated with massive bilobar hyperplasia and a large middle lobe primarily on the left.  Initially he was noted to have a large amount clot in the bladder and this was irrigated out with a Toomey syringe with return of approximately 100 mL of clot.  Subsequent inspection with the cystoscope was somewhat difficult due to residual active bleeding so I switched to the 26 French continuous-flow resectoscope sheath but this initially appeared to be too short to effectively work within the bladder.  I then switched to the long TUR instruments was able to better inspect the bladder and evacuate the remaining  clots however a cord was not available for the working loops I was not able to fulgurate with this particular instrument.  However inspection revealed mild trabeculation of the bladder with some hemorrhagic changes consistent with his recent catheterization no obvious tumors were identified in the bladder.  I was unable to locate ureteral orifice ease to the massive middle lobe.  I then switched back to the 26 French continuous-flow resectoscope sheath and fitted this with an Beatrix Fetters handle with a bipolar loop and 30 degree lens.  I generously fulgurated the bladder neck where there were prominent vessels that bled easily extending out onto the middle lobe and then fulgurated additional tissue in the proximal primarily pot posterior lateral prostatic urethra.  On further inspection there appeared to be a papillary lesion at the left anterior bladder neck.  It was difficult to know if this was inflammatory or neoplastic however I felt that biopsy was indicated.  TUR biopsy of the papillary lesion was performed and the biopsy site was generously fulgurated  The specimen was retrieved and final hemostasis was achieved.  The resectoscope was removed and a 24 [place] three-way Foley catheter was placed with the aid of a catheter guide.  The catheter balloon was filled with 30 mL of sterile fluid.  The catheter was irrigated with clear return and then connected to continuous irrigation with saline and straight drainage.  The patient was taken down from lithotomy position, his anesthetic was reversed and he was moved to recovery room in stable condition. there were no complications.

## 2018-01-17 NOTE — Progress Notes (Signed)
PROGRESS NOTE [facility]   [name] [name].   MWN:[id number] DOB: 1946-05-08  DOA: 01/14/2018 PCP: No primary care provider on file.   Brief Narrative:  [name] [name]. 73 year old male with past medical history of hypertension and legally blind who presented to the emergency department complaining of blood in the urine. Patient report having hematuria for about 5 years, has not been seen by urology in a while. Patient came to the ED because he was losing too much blood. Upon ED evaluation hemoglobin was found to be 3.8, potassium 2.7. Patient was admitted with working diagnosis of gross hematuria.  Subjective: Patient doing well this am, s/p cystoscopy with evacuation of clots this am. Tolerated well procedure.   Assessment & Plan: Blood loss anemia due to gross hematuria Status post 4 units of PRBCs during hospital stay, Hgb stable, source of bleeding seems to be resolve. Will continue to monitor Hgb closely.   Gross hematuria Unclear etiology at this time, however high suspicious for malignancy. With polypoid lesion in the bladder this could represent bladder cancer. Urology recommendations appreciated - Patient underwent cystoscopy with evacuation of clots and fulguration of bladder. TUR biopsy of the lesion.neck bladder performed. Continue management per urology   Enlarged prostate History of elevated PSA, continue Proscar. Increasing PSA could be related to blood irritation. Per urology.   Hypokalemia Resolved   Hypertension Stable, continue current management   Chronic venous stasis Continue lasix, responded well.   DVT prophylaxis: SCD's  Code Status: Full Code  Family Communication: None at bedside  Disposition Plan: Home when cleared by urology   Consultants:   Urology   Procedures:   None   Antimicrobials:  None     Objective: Vitals:   01/17/18 0915 01/17/18 0930 01/17/18 0934 01/17/18 0946  BP: 140/85 130/82 130/83 137/82    Pulse: 72 (!) 59 68 61  Resp: 11 12 12 12   Temp:   97.6 F (36.4 C) 97.6 F (36.4 C)  TempSrc:      SpO2: 100% 100% 100% 100%  Weight:      Height:        Intake/Output Summary (Last 24 hours) at 01/17/2018 1244 Last data filed at 01/17/2018 1100 Gross per 24 hour  Intake 34072.5 ml  Output 36075 ml  Net -2002.5 ml   Filed Weights   01/13/18 2356  Weight: 86.2 kg    Examination:  General: Pt is alert, awake, not in acute distress Cardiovascular: RRR, S1/S2 +, no rubs, no gallops Respiratory: CTA bilaterally, no wheezing, no rhonchi Abdominal: Soft, NT, ND, bowel sounds + Extremities: no edema, no cyanosis   Data Reviewed: I have personally reviewed following labs and imaging studies  CBC: Recent Labs  Lab 01/14/18 0018 01/14/18 1413 01/15/18 0437 01/16/18 0435 01/17/18 0438  WBC 5.4  --  8.5 9.2 8.2  NEUTROABS 3.2  --   --  6.7 5.8  HGB 3.8* 6.9* 7.9* 7.7* 7.9*  HCT 12.5* 21.7* 24.4* 23.6* 24.7*  MCV 73.1*  --  77.5* 77.9* 78.9  PLT 261  --  228 208 403   Basic Metabolic Panel: Recent Labs  Lab 01/14/18 0018 01/14/18 1413 01/15/18 0437 01/16/18 0435 01/17/18 0438  NA 137  --  142 142 142  K 2.7* 3.0* 3.4* 4.1 4.1  CL 103  --  106 107 105  CO2 24  --  25 27 28   GLUCOSE 105*  --  96 99 90  BUN 14  --  12 12  13  CREATININE 1.07  --  0.84 0.81 0.83  CALCIUM 8.3*  --  8.1* 8.1* 7.9*  MG  --   --  2.1  --  2.1   GFR: Estimated Creatinine Clearance: 87.2 mL/min (by C-G formula based on SCr of 0.83 mg/dL). Liver Function Tests: No results for input(s): [name], ALT, ALKPHOS, BILITOT, PROT, ALBUMIN in the last 168 hours. No results for input(s): LIPASE, AMYLASE in the last 168 hours. No results for input(s): AMMONIA in the last 168 hours. Coagulation Profile: No results for input(s): INR, PROTIME in the last 168 hours. Cardiac Enzymes: No results for input(s): CKTOTAL, CKMB, CKMBINDEX, TROPONINI in the last 168 hours. BNP (last 3 results) No results for  input(s): PROBNP in the last 8760 hours. HbA1C: No results for input(s): HGBA1C in the last 72 hours. CBG: No results for input(s): GLUCAP in the last 168 hours. Lipid Profile: No results for input(s): CHOL, HDL, LDLCALC, TRIG, CHOLHDL, LDLDIRECT in the last 72 hours. Thyroid Function Tests: No results for input(s): TSH, T4TOTAL, FREET4, T3FREE, THYROIDAB in the last 72 hours. Anemia Panel: Recent Labs    01/16/18 0435  VITAMINB12 1,000*  FOLATE 10.3  FERRITIN 12*  TIBC 439  IRON 21*  RETICCTPCT 3.5*   Sepsis Labs: No results for input(s): PROCALCITON, LATICACIDVEN in the last 168 hours.  Recent Results (from the past 240 hour(s))  Urine culture     Status: Abnormal   Collection Time: 01/14/18  1:00 AM  Result Value Ref Range Status   Specimen Description   Final    URINE, RANDOM Performed at [facility] [address], [place], [place] [address]    Special Requests   Final    NONE Performed at [facility], [place] [address], [place], [place] [address]    Culture (A)  Final    <10,000 COLONIES/mL INSIGNIFICANT GROWTH Performed at [facility] [address] [place], [place] [address]    Report Status 01/15/2018 FINAL  Final  Surgical pcr screen     Status: None   Collection Time: 01/17/18  5:41 AM  Result Value Ref Range Status   MRSA, PCR NEGATIVE NEGATIVE Final   Staphylococcus aureus NEGATIVE NEGATIVE Final    Comment: (NOTE) The Xpert SA Assay (FDA approved for NASAL specimens in patients 95 years of age and older), is one component of a comprehensive surveillance program. It is not intended to diagnose infection nor to guide or monitor treatment. Performed at [facility], [place] [address] [place], [place] [address]       Radiology Studies: No results found.    Scheduled Meds: . brimonidine  1 drop Both Eyes BID  . dorzolamide-timolol  1 drop Both Eyes BID  . finasteride  5 mg Oral  Daily  . folic acid  151 mcg Oral Daily  . furosemide  20 mg Oral Daily  . hydrochlorothiazide  25 mg Oral Daily  . HYDROmorphone      . iron polysaccharides  150 mg Oral Daily  . magnesium oxide  200 mg Oral Daily  . pilocarpine  1 drop Right Eye QID  . pyridOXINE  100 mg Oral q morning - 10a  . vitamin B-12  1,000 mcg Oral q morning - 10a  . vitamin C  2,000 mg Oral q morning - 10a   Continuous Infusions: . sodium chloride irrigation       LOS: 3 days    Time spent: Total of  25 minutes spent with pt, greater than 50% of which was spent in discussion of  treatment, counseling and coordination of care    Chipper Oman, MD Pager: Text Page via [email or web address]   If 7PM-7AM, please contact night-coverage [email or web address] 01/17/2018, 12:44 PM   Note - This record has been created using [employer]. Chart creation errors have been sought, but may not always have been located. Such creation errors do not reflect on the standard of medical care.

## 2018-01-18 ENCOUNTER — Encounter (HOSPITAL_COMMUNITY): Payer: Self-pay | Admitting: Urology

## 2018-01-18 LAB — CBC WITH DIFFERENTIAL/PLATELET
Basophils Absolute: 0 10*3/uL (ref 0.0–0.1)
Basophils Relative: 0 %
Eosinophils Absolute: 0 10*3/uL (ref 0.0–0.7)
Eosinophils Relative: 0 %
HCT: 27.1 % — ABNORMAL LOW (ref 39.0–52.0)
Hemoglobin: 8.5 g/dL — ABNORMAL LOW (ref 13.0–17.0)
Lymphocytes Relative: 5 %
Lymphs Abs: 0.7 10*3/uL (ref 0.7–4.0)
MCH: 24.6 pg — ABNORMAL LOW (ref 26.0–34.0)
MCHC: 31.4 g/dL (ref 30.0–36.0)
MCV: 78.3 fL (ref 78.0–100.0)
Monocytes Absolute: 0.3 10*3/uL (ref 0.1–1.0)
Monocytes Relative: 2 %
Neutro Abs: 14.2 10*3/uL — ABNORMAL HIGH (ref 1.7–7.7)
Neutrophils Relative %: 93 %
Platelets: 229 10*3/uL (ref 150–400)
RBC: 3.46 MIL/uL — ABNORMAL LOW (ref 4.22–5.81)
RDW: 19.6 % — ABNORMAL HIGH (ref 11.5–15.5)
WBC: 15.2 10*3/uL — ABNORMAL HIGH (ref 4.0–10.5)

## 2018-01-18 MED ORDER — FUROSEMIDE 20 MG PO TABS: 20.0000 mg | ORAL_TABLET | Freq: Every day | ORAL | Status: DC

## 2018-01-18 MED ORDER — POLYSACCHARIDE IRON COMPLEX 150 MG PO CAPS: 150.0000 mg | ORAL_CAPSULE | Freq: Every day | ORAL | 0 refills | Status: DC

## 2018-01-18 MED ORDER — FINASTERIDE 5 MG PO TABS: 5.0000 mg | ORAL_TABLET | Freq: Every day | ORAL | 0 refills | Status: DC

## 2018-01-18 NOTE — Discharge Summary (Signed)
Physician Discharge Summary  Patient ID: [name] [name]. MRN: [id number] DOB/AGE: 08-Apr-1946 72 y.o.  Admit date: 01/14/2018 Discharge date: 01/18/2018  Admission Diagnoses:  Discharge Diagnoses:  Principal Problem:   Acute blood loss anemia Active Problems:   Hypertension   Hematuria   Hypokalemia   Legally blind   Anemia due to blood loss, chronic   Discharged Condition: stable  Hospital Course:  [name] Shen. is a 72 year old male with past medical history of massive BPH, hematuria with intermittent clots, elevated PSA but patient has refused biopsy, hypertension, glaucoma, abnormalities of the liver function, low potassium, hypertension, and patient is legally blind.  Patient was admitted with gross hematuria. On presentation to the hospital, patient's hemoglobin was 3.8 g/dL, and the potassium was 2.7. PSA on admission was 38.32.  Patient was admitted for further assessment and management.    Blood loss anemia due to gross hematuria: Status post 4 units of PRBCs during hospital stay.  Hemoglobin remains stable at 8.5 g/dL.  Kindly continue to monitor patient's hemoglobin.  Gross hematuria/enlarged prostate/elevated PSA.:  kindly see urology input below.    Hypokalemia Resolved. Continue to monitor.  Hypertension Stable, continue current management   Chronic venous stasis Continue lasix, responded well.   Consults: Urology.  Urology recommended placement of Foley catheter for bladder irrigation, and re-starting of finasteride.  Urology plans to proceed with cystoscopy to rule out associated bladder tumor when acute blood loss anemia is taken care of.    Discharge Exam: Blood pressure (!) 146/104, pulse 91, temperature 98 F (36.7 C), temperature source Oral, resp. rate 18, height 5\' 8"  (1.727 m), weight 86.2 kg, SpO2 99 %.   Disposition: Discharge disposition: 01-Home or Self Care   Discharge Instructions    Diet - low sodium heart healthy    Complete by:  As directed    Increase activity slowly   Complete by:  As directed      Allergies as of 01/18/2018   No Known Allergies     Medication List    TAKE these medications   brimonidine 0.1 % Soln Commonly known as:  ALPHAGAN P Place 1 drop into both eyes 2 (two) times daily.   dorzolamide-timolol 22.3-6.8 MG/ML ophthalmic solution Commonly known as:  COSOPT APPOINTMENT OVERDUE, instill 1 drop in both eyes twice daily What changed:    how much to take  how to take this  when to take this   finasteride 5 MG tablet Commonly known as:  PROSCAR Take 1 tablet (5 mg total) by mouth daily. Start taking on:  2/42/6834   folic acid 196 MCG tablet Commonly known as:  FOLVITE Take 400 mcg by mouth daily.   hydrochlorothiazide 25 MG tablet Commonly known as:  HYDRODIURIL TAKE 1 TABLET BY MOUTH EVERY DAY   iron polysaccharides 150 MG capsule Commonly known as:  NIFEREX Take 1 capsule (150 mg total) by mouth daily. Start taking on:  01/19/2018   Magnesium 250 MG Tabs Take 250 mg by mouth daily.   pilocarpine 2 % ophthalmic solution Commonly known as:  PILOCAR Place 1 drop into the right eye 4 (four) times daily.   pyridOXINE 100 MG tablet Commonly known as:  VITAMIN B-6 Take 100 mg by mouth every morning.   vitamin B-12 500 MCG tablet Commonly known as:  CYANOCOBALAMIN Take 1,000 mcg by mouth every morning.   vitamin C 500 MG tablet Commonly known as:  ASCORBIC ACID Take 2,000 mg by mouth every morning.  Follow-up Information    Irine Seal, MD Follow up.   Specialty:  Urology Why:  The office will contact you with a follow up appointment for 2-3 weeks from now.   Contact information: [place] [address] [phone]           Signed: Bonnell Public 01/18/2018, 4:07 PM

## 2018-01-18 NOTE — Progress Notes (Signed)
1 Day Post-Op Subjective: Patient reports no pain/spasms.  Objective: Vital signs in last 24 hours: Temp:  [97.6 F (36.4 C)-99 F (37.2 C)] 99 F (37.2 C) (08/16 0453) Pulse Rate:  [59-87] 79 (08/16 0453) Resp:  [11-19] 18 (08/16 0453) BP: (130-157)/(76-93) 146/76 (08/16 0453) SpO2:  [95 %-100 %] 95 % (08/16 0453)  Intake/Output from previous day: 08/15 0701 - 08/16 0700 In: 4426 [P.O.:240; I.V.:886] Out: 12625 [Urine:12625] Intake/Output this shift: Total I/O In: 336 [I.V.:336] Out: 7850 [Urine:7850]  Physical Exam:  Constitutional: Vital signs reviewed. WD WN in NAD   Eyes: PERRL, No scleral icterus.    Irrigant clear Lab Results: Recent Labs    01/16/18 0435 01/17/18 0438 01/18/18 0435  HGB 7.7* 7.9* 8.5*  HCT 23.6* 24.7* 27.1*   BMET Recent Labs    01/16/18 0435 01/17/18 0438  NA 142 142  K 4.1 4.1  CL 107 105  CO2 27 28  GLUCOSE 99 90  BUN 12 13  CREATININE 0.81 0.83  CALCIUM 8.1* 7.9*   No results for input(s): LABPT, INR in the last 72 hours. No results for input(s): LABURIN in the last 72 hours. Results for orders placed or performed during the hospital encounter of 01/14/18  Urine culture     Status: Abnormal   Collection Time: 01/14/18  1:00 AM  Result Value Ref Range Status   Specimen Description   Final    URINE, RANDOM Performed at [facility] [address], [place], [place] [address]    Special Requests   Final    NONE Performed at [facility], [place] [address] [place], [place] [address]    Culture (A)  Final    <10,000 COLONIES/mL INSIGNIFICANT GROWTH Performed at [facility] [address] [place], [place] [address]    Report Status 01/15/2018 FINAL  Final  Surgical pcr screen     Status: None   Collection Time: 01/17/18  5:41 AM  Result Value Ref Range Status   MRSA, PCR NEGATIVE NEGATIVE Final   Staphylococcus aureus NEGATIVE NEGATIVE Final    Comment: (NOTE) The Xpert SA  Assay (FDA approved for NASAL specimens in patients 54 years of age and older), is one component of a comprehensive surveillance program. It is not intended to diagnose infection nor to guide or monitor treatment. Performed at [facility], [place] [address] [place], [place] [address]     Studies/Results: No results found.  Assessment/Plan:   S/P cysto/clot evac/fulguration/Bx. Doing well  Will d/c foley for TOV w/ string of bottles WBC elevated--watch for fever OK for D/C once he voids clear urine Continue finasteride   LOS: 4 days   Jorja Loa 01/18/2018, 6:54 AM

## 2018-01-18 NOTE — Progress Notes (Signed)
Contacted by nursing to evaluate color of patient's urine which is currently a thin watermelon color without clot. Patient emptying bladder subjectively well. OK to discharge from urologic perspective.

## 2018-01-18 NOTE — Progress Notes (Signed)
Discharge instructions (including medications) discussed with and copy provided to patient/caregiver 

## 2018-01-18 NOTE — Care Management Important Message (Signed)
Important Message  Patient Details  Name: [name] [name]. MRN: [id number] Date of Birth: 1945/12/11   Medicare Important Message Given:  Yes    Kerin Salen 01/18/2018, 11:23 [place] Message  Patient Details  Name: [name] [name]. MRN: [id number] Date of Birth: Nov 11, 1945   Medicare Important Message Given:  Yes    Kerin Salen 01/18/2018, 11:23 AM

## 2018-01-22 NOTE — Anesthesia Postprocedure Evaluation (Signed)
Anesthesia Post Note  Patient: [name] [name].  Procedure(s) Performed: CYSTOSCOPY CLOT EVACUATION WITH FULGERATION TUR BIOPSY OF BLADDER NECK (N/A )     Patient location during evaluation: PACU Anesthesia Type: General Level of consciousness: awake and sedated Pain management: pain level controlled Vital Signs Assessment: post-procedure vital signs reviewed and stable Respiratory status: spontaneous breathing Cardiovascular status: stable Postop Assessment: no apparent nausea or vomiting Anesthetic complications: no    Last Vitals:  Vitals:   01/18/18 0453 01/18/18 1339  BP: (!) 146/76 (!) 146/104  Pulse: 79 91  Resp: 18 18  Temp: 37.2 C 36.7 C  SpO2: 95% 99%    Last Pain:  Vitals:   01/18/18 1339  TempSrc: Oral  PainSc:    Pain Goal:                 Yancy Hascall JR,JOHN Currie Dennin

## 2018-01-24 ENCOUNTER — Other Ambulatory Visit: Payer: Self-pay

## 2018-01-24 ENCOUNTER — Ambulatory Visit (INDEPENDENT_AMBULATORY_CARE_PROVIDER_SITE_OTHER): Payer: Medicare Other | Admitting: Family

## 2018-01-24 VITALS — BP 146/78 | HR 69 | Temp 98.1°F | Resp 14 | Ht 68.0 in | Wt 208.0 lb

## 2018-01-24 DIAGNOSIS — R609 Edema, unspecified: Secondary | ICD-10-CM

## 2018-01-24 DIAGNOSIS — D509 Iron deficiency anemia, unspecified: Secondary | ICD-10-CM

## 2018-01-24 MED ORDER — POTASSIUM CHLORIDE CRYS ER 20 MEQ PO TBCR: 20.0000 meq | EXTENDED_RELEASE_TABLET | Freq: Every day | ORAL | 3 refills | Status: DC

## 2018-01-24 MED ORDER — FUROSEMIDE 20 MG PO TABS: 20.0000 mg | ORAL_TABLET | Freq: Every day | ORAL | 3 refills | Status: DC

## 2018-01-24 NOTE — Patient Outreach (Signed)
[facility]) Care Management  01/24/2018  [name] [name]. September 22, 1945 [id number]     EMMI-General Discharge RED ON EMMI ALERT Day # 4 Date: 01/23/18 Red Alert Reason: " Know who to call about changes in condition? No   Unfilled prescriptions? Yes"   Outreach attempt # 1 to patient. Spoke with patient who states he is doing fairly well. Reviewed and addressed red alerts with patient. Patient confirmed that he has contact info for MDs and is aware of when and how to reach them if needed for changes inc conditions. He goes today for PCP follow up appt and voices he sess Dr. Cherylann Parr) in the upcoming weeks. Patient conforms that he has transportation to appt. He stets that he has all his meds except Finasteride and Lasix. He is going today to pick up meds. He voices that he has to see MD in order to get Lasix refilled but will pick meds up today and start taking them. He states that he hs adhering to diet and fluid recommendations. He denies any further RN CM needs or concerns at this time. Patient has completed post discharge automated EMMI calls.        Plan: RN CM will close case at this time.    [name] Montgomery, RN,BSN,CCM [facility] Telephonic Care Management Coordinator Direct Phone: [phone] Toll Free: [phone] Fax: [phone]

## 2018-01-24 NOTE — Progress Notes (Addendum)
Provider: Marlowe Sax FNP-C  No primary care provider on file.  Patient Care Team: Florance, Tomasa Blase, RN as [name] [name] Management  Extended Emergency Contact Information Primary Emergency Contact: Hulen Luster  [place] Phone: [phone] Relation: Brother  Goals of care: Advanced Directive information Advanced Directives 01/14/2018  Does Patient Have a Medical Advance Directive? No  Would patient like information on creating a medical advance directive? No - Patient declined  Pre-existing out of facility DNR order (yellow form or pink MOST form) -     Chief Complaint  Patient presents with  . Acute Visit    feet and legs swelling---> wants diuretic    HPI:  Pt is a 72 y.o. male seen today at [facility] office for an acute visit for evaluation of leg swelling.He has had chronic leg swelling for several years.He states has been taking HCTZ without any improvement.He states was given lasix at the hospital which help a lot with the leg swelling.He would like to start on lasix.He was seen at [facility] on 01/14/2018 for hematuria.His Hgb was 3.8 and had 2 PRBC transfused.His PSA was also elevated concerning for malignancy.Has had enlarged Prostate in the past.Urology follow up recommended.He has an upcoming appointment with [facility] on 01/31/2018. He states feeling much better after receiving blood transfusion.    Past Medical History:  Diagnosis Date  . Blind   . Disorder of bone and cartilage, unspecified   . Edema   . Elevated prostate specific antigen (PSA)   . Hypertension   . Hypopotassemia   . Nonspecific abnormal results of liver function study   . Unspecified glaucoma(365.9)    Past Surgical History:  Procedure Laterality Date  . CYSTOSCOPY WITH FULGERATION N/A 01/17/2018   Procedure: CYSTOSCOPY CLOT EVACUATION WITH FULGERATION TUR BIOPSY OF BLADDER NECK;  Surgeon: Irine Seal, MD;  Location: [facility];   Service: Urology;  Laterality: N/A;  . EYE SURGERY Bilateral 2009   for glaucoma  . EYE SURGERY Left 2014  . HERNIA REPAIR Right 2003   Dr. Jeraldine Loots  . LAPAROSCOPIC APPENDECTOMY N/A 12/24/2012   Procedure:  LAPAROSCOPIC APPENDECTOMY ;  Surgeon: Pedro Earls, MD;  Location: [facility];  Service: General;  Laterality: N/A;    No Known Allergies  Outpatient Encounter Medications as of 01/24/2018  Medication Sig  . brimonidine (ALPHAGAN P) 0.1 % SOLN Place 1 drop into both eyes 2 (two) times daily.   . cyanocobalamin 500 MCG tablet Take 1,000 mcg by mouth every morning.   . dorzolamide-timolol (COSOPT) 22.3-6.8 MG/ML ophthalmic solution APPOINTMENT OVERDUE, instill 1 drop in both eyes twice daily  . finasteride (PROSCAR) 5 MG tablet Take 1 tablet (5 mg total) by mouth daily.  . folic acid (FOLVITE) 711 MCG tablet Take 400 mcg by mouth daily.  . iron polysaccharides (NIFEREX) 150 MG capsule Take 1 capsule (150 mg total) by mouth daily.  . Magnesium 250 MG TABS Take 250 mg by mouth daily.  . pilocarpine (PILOCAR) 2 % ophthalmic solution Place 1 drop into the right eye 4 (four) times daily.   Marland Kitchen pyridOXINE (VITAMIN B-6) 100 MG tablet Take 100 mg by mouth every morning.  . vitamin C (ASCORBIC ACID) 500 MG tablet Take 2,000 mg by mouth every morning.   . furosemide (LASIX) 20 MG tablet Take 1 tablet (20 mg total) by mouth daily.  . potassium chloride SA (K-DUR,KLOR-CON) 20 MEQ tablet Take 1 tablet (20 mEq total) by mouth daily.  . [  DISCONTINUED] hydrochlorothiazide (HYDRODIURIL) 25 MG tablet TAKE 1 TABLET BY MOUTH EVERY DAY (Patient not taking: No sig reported)   No facility-administered encounter medications on file as of 01/24/2018.     Review of Systems  Constitutional: Negative for appetite change, chills, fatigue, fever and unexpected weight change.  Eyes: Positive for visual disturbance. Negative for discharge and redness.       Legally blind   Respiratory: Negative for cough, chest  tightness, shortness of breath and wheezing.   Cardiovascular: Positive for leg swelling. Negative for chest pain and palpitations.  Gastrointestinal: Negative for abdominal distention, abdominal pain, constipation, diarrhea, nausea and vomiting.  Genitourinary: Negative for dysuria, flank pain, hematuria and urgency.  Skin: Negative for color change, pallor and rash.  Neurological: Negative for dizziness, syncope, light-headedness and headaches.  Hematological: Does not bruise/bleed easily.    There is no immunization history for the selected administration types on file for this patient. Pertinent  Health Maintenance Due  Topic Date Due  . PNA vac Low Risk Adult (1 of 2 - PCV13) 03/07/2011  . INFLUENZA VACCINE  01/03/2018  . COLONOSCOPY  02/15/2026 (Originally 03/06/1996)   Fall Risk  02/16/2016 09/17/2012  Falls in the past year? No No    Vitals:   01/24/18 1317  BP: (!) 146/78  Pulse: 69  Resp: 14  Temp: 98.1 F (36.7 C)  TempSrc: Oral  SpO2: 99%  Weight: 208 lb (94.3 kg)  Height: _0  (1.727 m)   Body mass index is 31.63 kg/m. Physical Exam  Constitutional: He is oriented to person, place, and time. He appears well-developed and well-nourished. No distress.  HENT:  Head: Normocephalic.  Mouth/Throat: Oropharynx is clear and moist. No oropharyngeal exudate.  Eyes: Conjunctivae are normal. Right eye exhibits no discharge. Left eye exhibits no discharge. No scleral icterus.  Sluggish pupil   Neck: Normal range of motion. No JVD present. No thyromegaly present.  Cardiovascular: Normal rate, regular rhythm, normal heart sounds and intact distal pulses. Exam reveals no gallop and no friction rub.  No murmur heard. Pulmonary/Chest: Effort normal and breath sounds normal. No respiratory distress. He has no wheezes. He has no rales.  Abdominal: Soft. Bowel sounds are normal. He exhibits no distension and no mass. There is no tenderness. There is no rebound and no guarding.    Musculoskeletal: Normal range of motion. He exhibits no tenderness.  Bilateral lower extremities 3+ pitting edema   Lymphadenopathy:    He has no cervical adenopathy.  Neurological: He is oriented to person, place, and time.  Skin: Skin is warm and dry. No rash noted. No erythema. There is pallor.  Psychiatric: He has a normal mood and affect. His speech is normal and behavior is normal. Thought content normal.   Labs reviewed: Recent Labs    01/15/18 0437 01/16/18 0435 01/17/18 0438  NA 142 142 142  K 3.4* 4.1 4.1  CL 106 107 105  CO2 _1 GLUCOSE 96 99 90  BUN _2 CREATININE 0.84 0.81 0.83  CALCIUM 8.1* 8.1* 7.9*  MG 2.1  --  2.1   Recent Labs    04/14/17 2020  AST 47*  ALT 35  ALKPHOS 54  BILITOT 1.1  PROT 7.0  ALBUMIN 3.5   Recent Labs    01/16/18 0435 01/17/18 0438 01/18/18 0435  WBC 9.2 8.2 15.2*  NEUTROABS 6.7 5.8 14.2*  HGB 7.7* 7.9* 8.5*  HCT 23.6* 24.7* 27.1*  MCV 77.9* 78.9 78.3  PLT 208 187 229   Lab Results  Component Value Date   TSH 1.06 02/16/2016   No results found for: HGBA1C Lab Results  Component Value Date   CHOL 167 02/16/2016   HDL 92 02/16/2016   LDLCALC 59 02/16/2016   TRIG 79 02/16/2016   CHOLHDL 1.8 02/16/2016    Significant Diagnostic Results in last 30 days:  US Renal  Result Date: 01/14/2018 CLINICAL DATA:  Hematuria. EXAM: RENAL / URINARY TRACT ULTRASOUND COMPLETE COMPARISON:  CT abdomen and pelvis 04/14/2017 FINDINGS: Right Kidney: Length: 11.8 cm. Multiple renal cysts are demonstrated. Largest measures 5.8 cm maximal diameter. No hydronephrosis and no solid masses identified. Left Kidney: Length: 12.3 cm. Multiple renal cysts are demonstrated. Largest measures about 5.3 cm maximal diameter. No solid mass or hydronephrosis identified. Bladder: Hyperechoic structure inferior to the bladder measuring 8.2 x 10.9 cm in diameter. Correlating with the previous CT, this likely represents a prominent enlarged prostate  gland. There is prostatic impression on the bladder base. The bladder is incompletely distended, limiting evaluation. There is suggestion of some polypoid filling defect in the bladder. This could be a lobular arising from the prostate gland or it could represent intrinsic bladder lesion such as transitional cell epithelioma or it could represent a blood clot. Consider cystoscopy for further evaluation if clinically indicated. IMPRESSION: 1. Bilateral renal cysts.  No hydronephrosis. 2. Marked enlargement of the prostate gland. 3. Bladder is incompletely distended, limiting evaluation. Suggestion of polypoid lesion in the bladder which could represent a lobule of the prostate gland protruding into the bladder or an intrinsic bladder lesion. Consider cystoscopy for further evaluation depending on clinical indications. Electronically Signed   By: Lucienne Capers M.D.   On: 01/14/2018 04:43   Assessment/Plan 1. Edema, unspecified type Bilateral lower extremities 3+ pitting edema.No cough,shortness of breath or wheezing.Lungs clear to auscultation. - furosemide 20 mg tablet one by mouth daily - Potassium chloride 20 mg tablet one by mouth daily  - Notify provider for worsening edema or shortness of breath. - encouraged to elevate legs when seated. - CMP with eGFR([facility])  2. Iron deficiency anemia, unspecified iron deficiency anemia type Status post ED visit 01/14/2018.Hgb 3.8 post 2 PRBC transfusion. No further Hematuria noted. - CBC with Differential/Platelets   Family/ staff Communication: Reviewed plan of care with patient.  Labs/tests ordered: CBC/diff,CMP   Follow up appointment : 02/10/2018 for edema.    Sandrea Hughs, NP

## 2018-01-24 NOTE — Patient Instructions (Signed)
Edema Edema is when you have too much fluid in your body or under your skin. Edema may make your legs, feet, and ankles swell up. Swelling is also common in looser tissues, like around your eyes. This is a common condition. It gets more common as you get older. There are many possible causes of edema. Eating too much salt (sodium) and being on your feet or sitting for a long time can cause edema in your legs, feet, and ankles. Hot weather may make edema worse. Edema is usually painless. Your skin may look swollen or shiny. Follow these instructions at home:  Keep the swollen body part raised (elevated) above the level of your heart when you are sitting or lying down.  Do not sit still or stand for a long time.  Do not wear tight clothes. Do not wear garters on your upper legs.  Exercise your legs. This can help the swelling go down.  Wear elastic bandages or support stockings as told by your doctor.  Eat a low-salt (low-sodium) diet to reduce fluid as told by your doctor.  Depending on the cause of your swelling, you may need to limit how much fluid you drink (fluid restriction).  Take over-the-counter and prescription medicines only as told by your doctor. Contact a doctor if:  Treatment is not working.  You have heart, liver, or kidney disease and have symptoms of edema.  You have sudden and unexplained weight gain. Get help right away if:  You have shortness of breath or chest pain.  You cannot breathe when you lie down.  You have pain, redness, or warmth in the swollen areas.  You have heart, liver, or kidney disease and get edema all of a sudden.  You have a fever and your symptoms get worse all of a sudden. Summary  Edema is when you have too much fluid in your body or under your skin.  Edema may make your legs, feet, and ankles swell up. Swelling is also common in looser tissues, like around your eyes.  Raise (elevate) the swollen body part above the level of your  heart when you are sitting or lying down.  Follow your doctor's instructions about diet and how much fluid you can drink (fluid restriction). This information is not intended to replace advice given to you by your health care provider. Make sure you discuss any questions you have with your health care provider. Document Released: 11/08/2007 Document Revised: 06/09/2016 Document Reviewed: 06/09/2016 Elsevier Interactive Patient Education  2017 Elsevier Inc.  

## 2018-01-25 ENCOUNTER — Other Ambulatory Visit: Payer: Self-pay | Admitting: *Deleted

## 2018-01-25 ENCOUNTER — Telehealth: Payer: Self-pay

## 2018-01-25 DIAGNOSIS — D509 Iron deficiency anemia, unspecified: Secondary | ICD-10-CM

## 2018-01-25 LAB — COMPLETE METABOLIC PANEL WITH GFR
AG Ratio: 1.3 (calc) (ref 1.0–2.5)
ALT: 25 U/L (ref 9–46)
AST: 36 U/L — ABNORMAL HIGH (ref 10–35)
Albumin: 3.4 g/dL — ABNORMAL LOW (ref 3.6–5.1)
Alkaline phosphatase (APISO): 65 U/L (ref 40–115)
BUN: 11 mg/dL (ref 7–25)
CO2: 19 mmol/L — ABNORMAL LOW (ref 20–32)
Calcium: 8.4 mg/dL — ABNORMAL LOW (ref 8.6–10.3)
Chloride: 108 mmol/L (ref 98–110)
Creat: 1.06 mg/dL (ref 0.70–1.18)
GFR, Est African American: 81 mL/min/{1.73_m2} (ref >=60)
GFR, Est Non African American: 70 mL/min/{1.73_m2} (ref >=60)
Globulin: 2.6 g/dL (calc) (ref 1.9–3.7)
Glucose, Bld: 95 mg/dL (ref 65–99)
Potassium: 4.3 mmol/L (ref 3.5–5.3)
Sodium: 139 mmol/L (ref 135–146)
Total Bilirubin: 0.6 mg/dL (ref 0.2–1.2)
Total Protein: 6 g/dL — ABNORMAL LOW (ref 6.1–8.1)

## 2018-01-25 LAB — CBC WITH DIFFERENTIAL/PLATELET

## 2018-01-25 NOTE — Telephone Encounter (Signed)
-----   Message from Sandrea Hughs, NP sent at 01/25/2018 11:53 AM EDT -----  Redraw CBC Thank You  Dinah Ngetich FNP-C   ----- Message ----- From: Logan Bores, CMA Sent: 01/25/2018  11:44 AM EDT To: Janalyn Harder Dance, CMA, Lurena Nida, #  Per Linus Orn, patients CBC will not be processed because the tube clotted. Please advise when and if patient should return to have this lab test performed  Maudie Mercury a future order will need to be placed if patient to come back  Thanks,    CB

## 2018-01-25 NOTE — Telephone Encounter (Signed)
Called patient, no answer, and no voicemail set up. I will try to call patient again later.  

## 2018-01-28 NOTE — Telephone Encounter (Signed)
Updated appointment notes for October appointment with [name] [name], patient to have CBC at time of appt per [facility]

## 2018-01-28 NOTE — Telephone Encounter (Signed)
Spoke with patient, patient states " It's no my fault that it clotted, I can not come back in, I caught a taxi last time and it cost me a lot of money and I can not afford to come in and have this redrawn, Im just sorry but I can't."    Patient aware I will share this response with ordering provider.  Message routed to [place], NP

## 2018-01-28 NOTE — Telephone Encounter (Signed)
Have CBC to be drawn on the next patient's appointment 03/12/2018 with Dewaine Oats J.NP if patient unable to return for a redraw of lab.

## 2018-01-28 NOTE — Telephone Encounter (Signed)
Forwarded to [employer].

## 2018-03-12 ENCOUNTER — Encounter: Payer: Medicare Other | Admitting: Nurse Practitioner

## 2018-04-24 ENCOUNTER — Other Ambulatory Visit: Payer: Self-pay

## 2019-02-18 ENCOUNTER — Emergency Department (HOSPITAL_COMMUNITY)
Admission: EM | Admit: 2019-02-18 | Discharge: 2019-02-18 | Disposition: A | Discharge status: Home / Self Care | Payer: Medicare Other

## 2019-03-11 DIAGNOSIS — H2513 Age-related nuclear cataract, bilateral: Secondary | ICD-10-CM | POA: Diagnosis not present

## 2019-03-11 DIAGNOSIS — H401133 Primary open-angle glaucoma, bilateral, severe stage: Secondary | ICD-10-CM | POA: Diagnosis not present

## 2019-03-12 ENCOUNTER — Ambulatory Visit: Payer: Medicare Other | Admitting: Family

## 2019-05-25 ENCOUNTER — Other Ambulatory Visit: Payer: Self-pay

## 2019-05-25 ENCOUNTER — Inpatient Hospital Stay (HOSPITAL_COMMUNITY)
Admission: EM | Admit: 2019-05-25 | Discharge: 2019-05-28 | DRG: 713 | Disposition: A | Discharge status: Home / Self Care | Payer: Medicare Other | Attending: Internal Medicine | Admitting: Internal Medicine

## 2019-05-25 ENCOUNTER — Observation Stay (HOSPITAL_COMMUNITY): Payer: Medicare Other

## 2019-05-25 ENCOUNTER — Encounter (HOSPITAL_COMMUNITY): Payer: Self-pay | Admitting: *Deleted

## 2019-05-25 DIAGNOSIS — R319 Hematuria, unspecified: Secondary | ICD-10-CM | POA: Diagnosis present

## 2019-05-25 DIAGNOSIS — N138 Other obstructive and reflux uropathy: Secondary | ICD-10-CM | POA: Diagnosis present

## 2019-05-25 DIAGNOSIS — R310 Gross hematuria: Secondary | ICD-10-CM | POA: Diagnosis not present

## 2019-05-25 DIAGNOSIS — Z79899 Other long term (current) drug therapy: Secondary | ICD-10-CM

## 2019-05-25 DIAGNOSIS — N400 Enlarged prostate without lower urinary tract symptoms: Secondary | ICD-10-CM

## 2019-05-25 DIAGNOSIS — N401 Enlarged prostate with lower urinary tract symptoms: Principal | ICD-10-CM | POA: Diagnosis present

## 2019-05-25 DIAGNOSIS — D649 Anemia, unspecified: Secondary | ICD-10-CM

## 2019-05-25 DIAGNOSIS — B955 Unspecified streptococcus as the cause of diseases classified elsewhere: Secondary | ICD-10-CM | POA: Diagnosis present

## 2019-05-25 DIAGNOSIS — Z20828 Contact with and (suspected) exposure to other viral communicable diseases: Secondary | ICD-10-CM | POA: Diagnosis not present

## 2019-05-25 DIAGNOSIS — E876 Hypokalemia: Secondary | ICD-10-CM | POA: Diagnosis present

## 2019-05-25 DIAGNOSIS — I10 Essential (primary) hypertension: Secondary | ICD-10-CM | POA: Diagnosis present

## 2019-05-25 DIAGNOSIS — D62 Acute posthemorrhagic anemia: Secondary | ICD-10-CM | POA: Diagnosis present

## 2019-05-25 DIAGNOSIS — N3289 Other specified disorders of bladder: Secondary | ICD-10-CM | POA: Diagnosis present

## 2019-05-25 DIAGNOSIS — Z841 Family history of disorders of kidney and ureter: Secondary | ICD-10-CM

## 2019-05-25 DIAGNOSIS — N179 Acute kidney failure, unspecified: Secondary | ICD-10-CM | POA: Diagnosis present

## 2019-05-25 DIAGNOSIS — H548 Legal blindness, as defined in USA: Secondary | ICD-10-CM | POA: Diagnosis present

## 2019-05-25 DIAGNOSIS — N390 Urinary tract infection, site not specified: Secondary | ICD-10-CM | POA: Diagnosis present

## 2019-05-25 DIAGNOSIS — H409 Unspecified glaucoma: Secondary | ICD-10-CM | POA: Diagnosis present

## 2019-05-25 LAB — URINALYSIS, ROUTINE W REFLEX MICROSCOPIC
Bilirubin Urine: NEGATIVE
Glucose, UA: 250 mg/dL — AB
Ketones, ur: 15 mg/dL — AB
Nitrite: POSITIVE — AB
Protein, ur: >300 mg/dL — AB
Specific Gravity, Urine: 1.02 (ref 1.005–1.030)
pH: 7.5 (ref 5.0–8.0)

## 2019-05-25 LAB — CBC
HCT: 16.1 % — ABNORMAL LOW (ref 39.0–52.0)
HCT: 22.8 % — ABNORMAL LOW (ref 39.0–52.0)
Hemoglobin: 4.9 g/dL — CL (ref 13.0–17.0)
Hemoglobin: 7.4 g/dL — ABNORMAL LOW (ref 13.0–17.0)
MCH: 26.3 pg (ref 26.0–34.0)
MCH: 27.9 pg (ref 26.0–34.0)
MCHC: 30.4 g/dL (ref 30.0–36.0)
MCHC: 32.5 g/dL (ref 30.0–36.0)
MCV: 86.6 fL (ref 80.0–100.0)
MCV: 86 fL (ref 80.0–100.0)
Platelets: 370 10*3/uL (ref 150–400)
Platelets: 258 10*3/uL (ref 150–400)
RBC: 1.86 MIL/uL — ABNORMAL LOW (ref 4.22–5.81)
RBC: 2.65 MIL/uL — ABNORMAL LOW (ref 4.22–5.81)
RDW: 16.7 % — ABNORMAL HIGH (ref 11.5–15.5)
RDW: 15.2 % (ref 11.5–15.5)
WBC: 11.5 10*3/uL — ABNORMAL HIGH (ref 4.0–10.5)
WBC: 12 10*3/uL — ABNORMAL HIGH (ref 4.0–10.5)
nRBC: 0.6 % — ABNORMAL HIGH (ref 0.0–0.2)
nRBC: 1.5 % — ABNORMAL HIGH (ref 0.0–0.2)

## 2019-05-25 LAB — URINALYSIS, MICROSCOPIC (REFLEX): RBC / HPF: >50 RBC/hpf (ref 0–5)

## 2019-05-25 LAB — ABO/RH: ABO/RH(D): B POS

## 2019-05-25 LAB — PROTIME-INR
INR: 1.1 (ref 0.8–1.2)
Prothrombin Time: 14.6 seconds (ref 11.4–15.2)

## 2019-05-25 LAB — SAMPLE TO BLOOD BANK

## 2019-05-25 LAB — COMPREHENSIVE METABOLIC PANEL
ALT: 20 U/L (ref 0–44)
AST: 24 U/L (ref 15–41)
Albumin: 3.1 g/dL — ABNORMAL LOW (ref 3.5–5.0)
Alkaline Phosphatase: 49 U/L (ref 38–126)
Anion gap: 15 (ref 5–15)
BUN: 17 mg/dL (ref 8–23)
CO2: 20 mmol/L — ABNORMAL LOW (ref 22–32)
Calcium: 8.3 mg/dL — ABNORMAL LOW (ref 8.9–10.3)
Chloride: 100 mmol/L (ref 98–111)
Creatinine, Ser: 1.31 mg/dL — ABNORMAL HIGH (ref 0.61–1.24)
GFR calc Af Amer: >60 mL/min (ref >60)
GFR calc non Af Amer: 54 mL/min — ABNORMAL LOW (ref >60)
Glucose, Bld: 148 mg/dL — ABNORMAL HIGH (ref 70–99)
Potassium: 3 mmol/L — ABNORMAL LOW (ref 3.5–5.1)
Sodium: 135 mmol/L (ref 135–145)
Total Bilirubin: 1 mg/dL (ref 0.3–1.2)
Total Protein: 6.2 g/dL — ABNORMAL LOW (ref 6.5–8.1)

## 2019-05-25 LAB — IRON AND TIBC
Iron: 38 ug/dL — ABNORMAL LOW (ref 45–182)
Saturation Ratios: 10 % — ABNORMAL LOW (ref 17.9–39.5)
TIBC: 371 ug/dL (ref 250–450)
UIBC: 333 ug/dL

## 2019-05-25 LAB — SARS CORONAVIRUS 2 (TAT 6-24 HRS): SARS Coronavirus 2: NEGATIVE

## 2019-05-25 LAB — PREPARE RBC (CROSSMATCH)

## 2019-05-25 LAB — MAGNESIUM: Magnesium: 2.1 mg/dL (ref 1.7–2.4)

## 2019-05-25 LAB — FERRITIN: Ferritin: 11 ng/mL — ABNORMAL LOW (ref 24–336)

## 2019-05-25 MED ORDER — POTASSIUM CHLORIDE CRYS ER 20 MEQ PO TBCR
40.0000 meq | EXTENDED_RELEASE_TABLET | Freq: Once | ORAL | Status: AC
  Administered 2019-05-25: 40 meq via ORAL
  Filled 2019-05-25: qty 2

## 2019-05-25 MED ORDER — VITAMIN B-6 100 MG PO TABS
100.0000 mg | ORAL_TABLET | Freq: Every morning | ORAL | Status: DC
  Administered 2019-05-25 – 2019-05-28 (×2): 100 mg via ORAL
  Filled 2019-05-25 (×5): qty 1

## 2019-05-25 MED ORDER — SODIUM CHLORIDE 0.9 % IV SOLN
1.0000 g | INTRAVENOUS | Status: DC
  Administered 2019-05-26 – 2019-05-27 (×2): 1 g via INTRAVENOUS
  Filled 2019-05-25: qty 1
  Filled 2019-05-25: qty 10
  Filled 2019-05-25: qty 1
  Filled 2019-05-25: qty 10

## 2019-05-25 MED ORDER — ASCORBIC ACID 500 MG PO TABS
1000.0000 mg | ORAL_TABLET | Freq: Every morning | ORAL | Status: DC
  Administered 2019-05-25 – 2019-05-28 (×2): 1000 mg via ORAL
  Filled 2019-05-25 (×4): qty 2

## 2019-05-25 MED ORDER — SODIUM CHLORIDE 0.9 % IV SOLN
10.0000 mL/h | Freq: Once | INTRAVENOUS | Status: AC
  Administered 2019-05-25: 10 mL/h via INTRAVENOUS

## 2019-05-25 MED ORDER — ACETAMINOPHEN 325 MG PO TABS: 650.0000 mg | ORAL_TABLET | Freq: Four times a day (QID) | ORAL | Status: DC | PRN

## 2019-05-25 MED ORDER — BRIMONIDINE TARTRATE 0.2 % OP SOLN
1.0000 [drp] | Freq: Two times a day (BID) | OPHTHALMIC | Status: DC
  Administered 2019-05-25 – 2019-05-28 (×5): 1 [drp] via OPHTHALMIC
  Filled 2019-05-25 (×5): qty 5

## 2019-05-25 MED ORDER — DORZOLAMIDE HCL-TIMOLOL MAL 2-0.5 % OP SOLN
1.0000 [drp] | Freq: Two times a day (BID) | OPHTHALMIC | Status: DC
  Administered 2019-05-25 – 2019-05-28 (×5): 1 [drp] via OPHTHALMIC
  Filled 2019-05-25 (×2): qty 10

## 2019-05-25 MED ORDER — SODIUM CHLORIDE 0.9 % IV SOLN: INTRAVENOUS | Status: AC

## 2019-05-25 MED ORDER — FINASTERIDE 5 MG PO TABS
5.0000 mg | ORAL_TABLET | Freq: Every day | ORAL | Status: DC
  Filled 2019-05-25: qty 1

## 2019-05-25 MED ORDER — FOLIC ACID 1 MG PO TABS
1.0000 mg | ORAL_TABLET | Freq: Every day | ORAL | Status: DC
  Administered 2019-05-25 – 2019-05-28 (×2): 1 mg via ORAL
  Filled 2019-05-25 (×4): qty 1

## 2019-05-25 MED ORDER — SODIUM CHLORIDE 0.9 % IV SOLN
1.0000 g | Freq: Once | INTRAVENOUS | Status: AC
  Administered 2019-05-25: 1 g via INTRAVENOUS
  Filled 2019-05-25: qty 10

## 2019-05-25 MED ORDER — POLYSACCHARIDE IRON COMPLEX 150 MG PO CAPS
150.0000 mg | ORAL_CAPSULE | Freq: Every day | ORAL | Status: DC
  Administered 2019-05-25 – 2019-05-28 (×2): 150 mg via ORAL
  Filled 2019-05-25 (×5): qty 1

## 2019-05-25 MED ORDER — CHLORHEXIDINE GLUCONATE CLOTH 2 % EX PADS
6.0000 | MEDICATED_PAD | Freq: Every day | CUTANEOUS | Status: DC
  Administered 2019-05-25 – 2019-05-27 (×2): 6 via TOPICAL

## 2019-05-25 MED ORDER — SODIUM CHLORIDE 0.9 % IR SOLN
3000.0000 mL | Status: DC
  Administered 2019-05-25 – 2019-05-26 (×4): 3000 mL

## 2019-05-25 MED ORDER — PILOCARPINE HCL 2 % OP SOLN
1.0000 [drp] | Freq: Three times a day (TID) | OPHTHALMIC | Status: DC
  Administered 2019-05-25 – 2019-05-28 (×10): 1 [drp] via OPHTHALMIC
  Filled 2019-05-25 (×2): qty 15

## 2019-05-25 MED ORDER — SODIUM CHLORIDE 0.9% FLUSH
3.0000 mL | Freq: Once | INTRAVENOUS | Status: AC
  Administered 2019-05-25: 3 mL via INTRAVENOUS

## 2019-05-25 MED ORDER — VITAMIN B-12 1000 MCG PO TABS
1000.0000 ug | ORAL_TABLET | Freq: Every morning | ORAL | Status: DC
  Administered 2019-05-25 – 2019-05-28 (×2): 1000 ug via ORAL
  Filled 2019-05-25 (×4): qty 1

## 2019-05-25 MED ORDER — OXYCODONE HCL 5 MG PO TABS
5.0000 mg | ORAL_TABLET | ORAL | Status: DC | PRN
  Administered 2019-05-25 – 2019-05-28 (×2): 5 mg via ORAL
  Filled 2019-05-25 (×3): qty 1

## 2019-05-25 MED ORDER — ACETAMINOPHEN 650 MG RE SUPP: 650.0000 mg | Freq: Four times a day (QID) | RECTAL | Status: DC | PRN

## 2019-05-25 MED ORDER — FINASTERIDE 5 MG PO TABS
5.0000 mg | ORAL_TABLET | Freq: Every day | ORAL | Status: DC
  Administered 2019-05-25 – 2019-05-28 (×3): 5 mg via ORAL
  Filled 2019-05-25 (×4): qty 1

## 2019-05-25 NOTE — ED Provider Notes (Signed)
[facility] EMERGENCY DEPARTMENT Provider Note   CSN: [id number] Arrival date & time: 05/25/19  0128     History Chief Complaint  Patient presents with  . Hematuria    [name] [name]. is a 73 y.o. male.  Patient presents to the emergency department for evaluation of hematuria.  Patient reports that [name] [name] has been seeing blood in his urine for approximately a week.  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  [name] [name] had been doing well until this week when [name] [name] started to see the blood again.  [name] [name] has mostly bright red blood in his urine with occasional small clots.  Patient does report that [name] [name] thinks [name] [name] has lost a lot of blood.  [name] [name] is feeling weak, gets dizzy when [name] [name] gets up and moves around.  [name] [name] has noticed some shortness of breath with ambulation as well.  No chest pain.        Past Medical History:  Diagnosis Date  . Blind   . Disorder of bone and cartilage, unspecified   . Edema   . Elevated prostate specific antigen (PSA)   . Hypertension   . Hypopotassemia   . Nonspecific abnormal results of liver function study   . Unspecified glaucoma(365.9)     Patient Active Problem List   Diagnosis Date Noted  . Anemia due to blood loss, chronic   . Acute blood loss anemia 01/14/2018  . Hypokalemia 01/14/2018  . 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 Hematuria 12/23/2012  . Chest pain 12/23/2012  . Anemia due to blood loss, acute 12/23/2012  . Elevated prostate specific antigen (PSA)   . Hypertension   . Edema     Past Surgical History:  Procedure Laterality Date  . CYSTOSCOPY WITH FULGERATION N/A 01/17/2018   Procedure: CYSTOSCOPY CLOT EVACUATION WITH FULGERATION TUR BIOPSY OF BLADDER NECK;  Surgeon: Irine Seal, MD;  Location: [facility];  Service: Urology;  Laterality: N/A;  . EYE SURGERY Bilateral 2009   for glaucoma  . EYE SURGERY Left 2014  . HERNIA REPAIR Right 2003   Dr. Jeraldine Loots  . LAPAROSCOPIC APPENDECTOMY N/A 12/24/2012   Procedure:  LAPAROSCOPIC APPENDECTOMY ;  Surgeon: Pedro Earls, MD;  Location: [facility];  Service: General;  Laterality: N/A;       Family History  Problem Relation Age of Onset  . Kidney failure Mother     Social History   Tobacco Use  . Smoking status: Never Smoker  . Smokeless tobacco: Never Used  Substance Use Topics  . Alcohol use: No    Comment: STOPED IN 1970S  . Drug use: No    Home Medications Prior to Admission medications   Medication Sig Start Date End Date Taking? Authorizing Provider  brimonidine (ALPHAGAN P) 0.1 % SOLN Place 1 drop into both eyes 2 (two) times daily.     [provider]  cyanocobalamin 500 MCG tablet Take 1,000 mcg by mouth every morning.     [provider]  dorzolamide-timolol (COSOPT) 22.3-6.8 MG/ML ophthalmic solution APPOINTMENT OVERDUE, instill 1 drop in both eyes twice daily 07/14/13   [name], NP  finasteride (PROSCAR) 5 MG tablet Take 1 tablet (5 mg total) by mouth daily. 01/19/18   Bonnell Public, MD  folic acid (FOLVITE) Q000111Q MCG tablet Take 400 mcg by mouth daily.    [provider]  furosemide (LASIX) 20 MG  tablet Take 1 tablet (20 mg total) by mouth daily. 01/24/18   Ngetich, Dinah C, NP  iron polysaccharides (NIFEREX) 150 MG capsule Take 1 capsule (150 mg total) by mouth daily. 01/19/18   Dana Allan I, MD  Magnesium 250 MG TABS Take 250 mg by mouth daily.    [provider]  pilocarpine (PILOCAR) 2 % ophthalmic solution Place 1 drop into the right eye 4 (four) times daily.  12/25/17   [provider]  potassium chloride SA (K-DUR,KLOR-CON) 20 MEQ tablet Take 1 tablet (20 mEq total) by mouth daily. 01/24/18   Ngetich, Dinah C, NP  pyridOXINE (VITAMIN B-6) 100 MG tablet Take 100 mg by mouth every morning.    [provider]  vitamin C (ASCORBIC ACID) 500 MG tablet Take 2,000 mg by mouth every morning.      [provider]    Allergies    Patient has no known allergies.  Review of Systems   Review of Systems  Constitutional: Positive for fatigue.  Genitourinary: Positive for hematuria.  Neurological: Positive for dizziness.  All other systems reviewed and are negative.   Physical Exam Updated Vital Signs BP 117/76 (BP Location: Right Arm)   Pulse 79   Temp 98.2 F (36.8 C) (Oral)   Resp 10   Ht 5\' 8"  (1.727 m)   Wt 90.7 kg   SpO2 100%   BMI 30.41 kg/m   Physical Exam Vitals and nursing note reviewed.  Constitutional:      General: [name] [name] is not in acute distress.    Appearance: Normal appearance. [name] [name] is well-developed.  HENT:     Head: Normocephalic and atraumatic.     Right Ear: Hearing normal.     Left Ear: Hearing normal.     Nose: Nose normal.  Eyes:     Conjunctiva/sclera: Conjunctivae normal.     Pupils: Pupils are equal, round, and reactive to light.  Cardiovascular:     Rate and Rhythm: Regular rhythm.     Heart sounds: S1 normal and S2 normal. No murmur. No friction rub. No gallop.   Pulmonary:     Effort: Pulmonary effort is normal. No respiratory distress.     Breath sounds: Normal breath sounds.  Chest:     Chest wall: No tenderness.  Abdominal:     General: Bowel sounds are normal.     Palpations: Abdomen is soft.     Tenderness: There is no abdominal tenderness. There is no guarding or rebound. Negative signs include Murphy's sign and McBurney's sign.     Hernia: A hernia is present. Hernia is present in the left inguinal area (soft, non-tender).  Musculoskeletal:        General: Normal range of motion.     Cervical back: Normal range of motion and neck supple.  Skin:    General: Skin is warm and dry.     Findings: No rash.  Neurological:     Mental Status: [name] [name] is alert and oriented to person, place, and time.     GCS: GCS eye subscore is 4. GCS verbal subscore is 5. GCS motor subscore is 6.     Cranial Nerves: No cranial nerve deficit.      Sensory: No sensory deficit.     Coordination: Coordination normal.  Psychiatric:        Speech: Speech normal.        Behavior: Behavior normal.        Thought Content: Thought content normal.  ED Results / Procedures / Treatments   Labs (all labs ordered are listed, but only abnormal results are displayed) Labs Reviewed  COMPREHENSIVE METABOLIC PANEL - Abnormal; Notable for the following components:      Result Value   Potassium 3.0 (*)    CO2 20 (*)    Glucose, Bld 148 (*)    Creatinine, Ser 1.31 (*)    Calcium 8.3 (*)    Total Protein 6.2 (*)    Albumin 3.1 (*)    GFR calc non Af Amer 54 (*)    All other components within normal limits  CBC - Abnormal; Notable for the following components:   WBC 11.5 (*)    RBC 1.86 (*)    Hemoglobin 4.9 (*)    HCT 16.1 (*)    RDW 16.7 (*)    nRBC 0.6 (*)    All other components within normal limits  URINE CULTURE  URINALYSIS, ROUTINE W REFLEX MICROSCOPIC  PROTIME-INR  SAMPLE TO BLOOD BANK  TYPE AND SCREEN  ABO/RH    EKG None  Radiology No results found.  Procedures Procedures (including critical care time)  Medications Ordered in ED Medications  sodium chloride flush (NS) 0.9 % injection 3 mL (has no administration in time range)    ED Course  I have reviewed the triage vital signs and the nursing notes.  Pertinent labs & imaging results that were available during my care of the patient were reviewed by me and considered in my medical decision making (see chart for details).    MDM Rules/Calculators/A&P                      Patient presents to the emergency department for evaluation of hematuria.  Patient has been bleeding for a week.  [name] [name] has a previous diagnosis of benign bladder tumor requiring resection.  Current episode of bleeding has been ongoing for 1 week.  Patient experiencing some mild symptoms of anemia.  Hemoglobin 4.9.  Will require blood transfusion and admission.  CRITICAL CARE Performed by:  Orpah Greek   Total critical care time: 35 minutes  Critical care time was exclusive of separately billable procedures and treating other patients.  Critical care was necessary to treat or prevent imminent or life-threatening deterioration.  Critical care was time spent personally by me on the following activities: development of treatment plan with patient and/or surrogate as well as nursing, discussions with consultants, evaluation of patient's response to treatment, examination of patient, obtaining history from patient or surrogate, ordering and performing treatments and interventions, ordering and review of laboratory studies, ordering and review of radiographic studies, pulse oximetry and re-evaluation of patient's condition.  Final Clinical Impression(s) / ED Diagnoses Final diagnoses:  Gross hematuria  Symptomatic anemia    Rx / DC Orders ED Discharge Orders    None       Marquelle Musgrave, Gwenyth Allegra, MD 05/25/19 [phone]

## 2019-05-25 NOTE — Progress Notes (Signed)
Patient completed his third unit of PRBC with no signs or symptoms of adverse reaction. Patient continues to rest comfortably in bed. Will continue to monitor for remainder of shift.

## 2019-05-25 NOTE — Progress Notes (Signed)
Patient admitted to unit from Emergency Department, VSS. Patient settled in bed and oriented to unit and hospital routine. Pt resting comfortably in bed with call bell in reach and side rails up. Instructed patient to utilize call bell for assistance. Will continue to monitor closely for remainder of shift.

## 2019-05-25 NOTE — ED Notes (Signed)
US at bedside

## 2019-05-25 NOTE — Progress Notes (Signed)
Same day note  Patient seen and examined at bedside.  Patient was admitted to the hospital for hematuria.  At the time of my evaluation, patient complains of hematuria.  Denies abdominal pain, fever or chills.  Physical examination reveals average built male.  Not in distress.  Pallor noted.  Condom catheter with gross hematuria.  Laboratory data and imaging was reviewed CBC    Component Value Date/Time   WBC 11.5 (H) 05/25/2019 0207   RBC 1.86 (L) 05/25/2019 0207   HGB 4.9 (LL) 05/25/2019 0207   HCT 16.1 (L) 05/25/2019 0207   PLT 370 05/25/2019 0207   MCV 86.6 05/25/2019 0207   MCH 26.3 05/25/2019 0207   MCHC 30.4 05/25/2019 0207   RDW 16.7 (H) 05/25/2019 0207   RDW 12.6 09/17/2012 1727   LYMPHSABS 0.7 01/18/2018 0435   LYMPHSABS 2.0 09/17/2012 1727   MONOABS 0.3 01/18/2018 0435   EOSABS 0.0 01/18/2018 0435   EOSABS 0.1 09/17/2012 1727   BASOSABS 0.0 01/18/2018 0435   BASOSABS 0.0 09/17/2012 1727   Assessment and Plan. Gross hematuria.  History of TURB- negative for malignancy.  Spoke with urology for consultation.  Seen by [facility] in the past.  Patient tells me that he has a bleeding cyst in his bladder.  History of BPH.  Continue finasteride  Hypokalemia.  Hold diuretic.  Replenish potassium.  Acute blood loss anemia secondary to gross hematuria.  Hemoglobin of 4.9 on presentation.  Transfusion of 3 units of packed RBC.Monitor hemoglobin level.  Possible UTI.  Continue Rocephin.  Acute kidney injury.  Hold diuretics.  Continue IV hydration.  No Charge  Signed,  Delila Pereyra, MD [facility]

## 2019-05-25 NOTE — Consult Note (Signed)
Urology Consult Note   Requesting Attending Physician:  [name] Lipps, MD Service Providing Consult: Urology  Consulting Attending: Shela [name]   Reason for Consult: Gross hematuria, acute blood loss anemia  HPI: [name] Ast. is seen in consultation for reasons noted above at the request of [name] Lipps, MD   This is a 73 y.o. male with the past medical history of BPH.  He was admitted back in August 2019 for gross hematuria with acute on chronic blood loss anemia with a hemoglobin of 3.8.  Urine remained bloody and therefore he underwent cystoscopy with evacuation of clots and fulguration of bleeders with TUR biopsy of the bladder neck which was negative.  At that time, the prostatic urethra was noted to be markedly elongated with massive by lobar hyperplasia and a large middle lobe primarily on the left.  Today, the patient presented with 1 week history of gross hematuria with fatigue and weakness.  The patient failed to follow-up with urology since his last hospitalization.  He had been started on finasteride but he has now stopped this since he no longer had any bleeding.  In the emergency department, he was noted to have a hemoglobin of 4.9.  He is currently receiving a blood transfusion.  He denies any current complaints.  Renal ultrasound in the emergency department revealed likely blood clot within the bladder.  He was not in retention.  He had massive enlargement of the prostate.  A CT scan about 5 years ago showed a markedly enlarged prostate that was 11.2 x 8.4 x 11.4 cm.   Past Medical History: Past Medical History:  Diagnosis Date  . Blind   . Disorder of bone and cartilage, unspecified   . Edema   . Elevated prostate specific antigen (PSA)   . Hypertension   . Hypopotassemia   . Nonspecific abnormal results of liver function study   . Unspecified glaucoma(365.9)     Past Surgical History:  Past Surgical History:  Procedure Laterality Date  . CYSTOSCOPY  WITH FULGERATION N/A 01/17/2018   Procedure: CYSTOSCOPY CLOT EVACUATION WITH FULGERATION TUR BIOPSY OF BLADDER NECK;  Surgeon: [name] Seal, MD;  Location: [facility];  Service: Urology;  Laterality: N/A;  . EYE SURGERY Bilateral 2009   for glaucoma  . EYE SURGERY Left 2014  . HERNIA REPAIR Right 2003   Dr. Jeraldine [name]  . LAPAROSCOPIC APPENDECTOMY N/A 12/24/2012   Procedure:  LAPAROSCOPIC APPENDECTOMY ;  Surgeon: [name] Earls, MD;  Location: [facility];  Service: General;  Laterality: N/A;    Medication: Current Facility-Administered Medications  Medication Dose Route Frequency Provider Last Rate Last Admin  . 0.9 %  sodium chloride infusion   Intravenous Continuous [name] Leff, MD 125 mL/hr at 05/25/19 0757 New Bag at 05/25/19 0757  . acetaminophen (TYLENOL) tablet 650 mg  650 mg Oral Q6H PRN [name] Leff, MD       Or  . acetaminophen (TYLENOL) suppository 650 mg  650 mg Rectal Q6H PRN [name] Leff, MD      . ascorbic acid (VITAMIN C) tablet 1,000 mg  1,000 mg Oral q morning - 10a [name] Leff, MD      . brimonidine (ALPHAGAN) 0.2 % ophthalmic solution 1 drop  1 drop Both Eyes BID [name] Leff, MD      . dorzolamide-timolol (COSOPT) 22.3-6.8 MG/ML ophthalmic solution 1 drop  1 drop Both Eyes BID [name] Leff, MD      . folic acid (FOLVITE) tablet 1 mg  1 mg  Oral Daily [name] Leff, MD      . iron polysaccharides (NIFEREX) capsule 150 mg  150 mg Oral Daily [name] Leff, MD      . pilocarpine (PILOCAR) 2 % ophthalmic solution 1 drop  1 drop Right Eye TID AC & HS [name] Leff, MD      . potassium chloride SA (KLOR-CON) CR tablet 40 mEq  40 mEq Oral Once [name] Leff, MD      . pyridOXINE (VITAMIN B-6) tablet 100 mg  100 mg Oral q morning - 10a [name] Leff, MD      . vitamin B-12 (CYANOCOBALAMIN) tablet 1,000 mcg  1,000 mcg Oral q morning - 10a [name] Leff, MD       Current Outpatient Medications  Medication Sig Dispense  Refill  . brimonidine (ALPHAGAN) 0.2 % ophthalmic solution Place 1 drop into both eyes 2 (two) times daily.    . cyanocobalamin 500 MCG tablet Take 1,000 mcg by mouth every morning.     . dorzolamide-timolol (COSOPT) 22.3-6.8 MG/ML ophthalmic solution APPOINTMENT OVERDUE, instill 1 drop in both eyes twice daily (Patient taking differently: Place 1 drop into both eyes 2 (two) times daily. ) 10 mL 0  . folic acid (FOLVITE) Q000111Q MCG tablet Take 400 mcg by mouth daily in the afternoon.     . hydrochlorothiazide (HYDRODIURIL) 50 MG tablet Take 12.5 mg by mouth daily.    . iron polysaccharides (POLY-IRON 150) 150 MG capsule Take 150 mg by mouth daily.    . Magnesium 250 MG TABS Take 250 mg by mouth as needed (for headaches).     . pilocarpine (PILOCAR) 2 % ophthalmic solution Place 1 drop into the right eye 4 (four) times daily.   0  . potassium chloride SA (K-DUR,KLOR-CON) 20 MEQ tablet Take 1 tablet (20 mEq total) by mouth daily. 30 tablet 3  . pyridOXINE (VITAMIN B-6) 100 MG tablet Take 100 mg by mouth every morning.    . vitamin C (ASCORBIC ACID) 500 MG tablet Take 1,000 mg by mouth every morning.     . finasteride (PROSCAR) 5 MG tablet Take 1 tablet (5 mg total) by mouth daily. (Patient not taking: Reported on 05/25/2019) 30 tablet 0  . furosemide (LASIX) 20 MG tablet Take 1 tablet (20 mg total) by mouth daily. (Patient not taking: Reported on 05/25/2019) 30 tablet 3  . iron polysaccharides (NIFEREX) 150 MG capsule Take 1 capsule (150 mg total) by mouth daily. (Patient not taking: Reported on 05/25/2019) 30 capsule 0    Allergies: No Known Allergies  Social History: Social History   Tobacco Use  . Smoking status: Never Smoker  . Smokeless tobacco: Never Used  Substance Use Topics  . Alcohol use: No    Comment: STOPED IN 1970S  . Drug use: No    Family History Family History  Problem Relation Age of Onset  . Kidney failure Mother     Review of Systems 10 systems were reviewed and  are negative except as noted specifically in the HPI.  Objective   Vital signs in last 24 hours: BP 115/63 (BP Location: Right Arm)   Pulse 81   Temp 98.9 F (37.2 C) (Oral)   Resp 15   Ht 5\' 8"  (1.727 m)   Wt 90.7 kg   SpO2 100%   BMI 30.41 kg/m   Physical Exam General: NAD, A&O, resting, appropriate HEENT: [place]/AT, EOMI, MMM Pulmonary: Normal work of breathing Cardiovascular: HDS, adequate peripheral perfusion Abdomen: Soft, NTTP, nondistended. GU:  Circumcised phallus.  Grossly bloody urine.  No CVA tenderness Extremities: warm and well perfused Neuro: Appropriate, no focal neurological deficits  Most Recent Labs: Lab Results  Component Value Date   WBC 11.5 (H) 05/25/2019   HGB 4.9 (LL) 05/25/2019   HCT 16.1 (L) 05/25/2019   PLT 370 05/25/2019    Lab Results  Component Value Date   NA 135 05/25/2019   K 3.0 (L) 05/25/2019   CL 100 05/25/2019   CO2 20 (L) 05/25/2019   BUN 17 05/25/2019   CREATININE 1.31 (H) 05/25/2019   CALCIUM 8.3 (L) 05/25/2019   MG 2.1 05/25/2019    Lab Results  Component Value Date   INR 1.1 05/25/2019   APTT 28 12/23/2012     Urine Culture: @[id number] (laburin,org,[id number],[id number])@   IMAGING: US RENAL  Result Date: 05/25/2019 CLINICAL DATA:  Hematuria EXAM: RENAL / URINARY TRACT ULTRASOUND COMPLETE COMPARISON:  01/14/2018 FINDINGS: Right Kidney: Renal measurements: 11.8 x 6.9 x 6.2 cm = volume: 266 mL. Upper and lower pole cysts measuring up to 5.4 cm at the lower pole Left Kidney: Renal measurements: 10 x 5 x 5 cm = volume: 140 mL. Despite smaller size no appreciable cortical thinning, some of the asymmetry may be due to angle of imaging. No hydronephrosis. Central cyst measuring up to 4.7 cm Bladder: Incomplete distension of the bladder with mobile avascular echogenic material that is most consistent with clot. There is mass along the bladder base with bladder up lifting, correlating with history of massive BPH. Grossly stable  appearance when compared with 2018 CT. IMPRESSION: 1. Mobile material in the bladder most consistent with blood clot. The bladder is not over distended. 2. Massive enlargement of the prostate. Electronically Signed   By: Monte Fantasia M.D.   On: 05/25/2019 08:02    Procedure: Urethral dilation with Foley catheter insertion with bladder irrigation Under sterile conditions, I attempted to pass a 24 French hematuria catheter.  There was resistance at the fossa navicularis.  Therefore sounds were used to carefully dilate the urethra up to 28 [place].  A 24 French hematuria catheter was then able to be advanced into the bladder with return of moderately bloody urine.  This was irrigated manually with moderate amount of clot evacuated.  Continuous bladder irrigation was initiated.  ------  Assessment:  73 y.o. male with gross hematuria with acute blood loss anemia likely secondary to severe BPH.   Recommendations: -Patient may have a diet now.  N.p.o. at midnight in the event that he needs any procedural management tomorrow.  However, upon irrigation, it did not appear that he had significant active bleeding. -Continue continuous bladder irrigation - Agree with transfusion -Resume finasteride   Thank you for this consult. Please contact the urology consult pager with any further questions/concerns.

## 2019-05-25 NOTE — ED Notes (Signed)
IV infiltrated. IV team at bedside to insert new line

## 2019-05-25 NOTE — ED Notes (Signed)
Can't collect rest of blood while blood infusion is running. Will defer until completed

## 2019-05-25 NOTE — ED Notes (Addendum)
ED TO INPATIENT HANDOFF REPORT  ED Nurse Name and Phone #: Thurmond Butts [place] Name/Age/Gender [name] [name]. 73 y.o. male Room/Bed: 036C/036C  Code Status   Code Status: Full Code  Home/SNF/Other Home Patient oriented to: self, place, time and situation Is this baseline? Yes   Triage Complete: Triage complete  Chief Complaint Hematuria [R31.9]  Triage Note The pt is c/o bloody ueinr for one week  He has some sl burning when he  Urinates right now no pain  He is visually impaired     Allergies No Known Allergies  Level of Care/Admitting Diagnosis ED Disposition    ED Disposition Condition [place]: [facility] [100100]  Level of Care: Med-Surg [16]  I expect the patient will be discharged within 24 hours: Yes  LOW acuity---Tx typically complete <24 hrs---ACUTE conditions typically can be evaluated <24 hours---LABS likely to return to acceptable levels <24 hours---IS near functional baseline---EXPECTED to return to current living arrangement---NOT newly hypoxic: Meets criteria for 5C-Observation unit  Covid Evaluation: Asymptomatic Screening Protocol (No Symptoms)  Diagnosis: Hematuria [id number]  Admitting Physician: Shela Leff [id number]  Attending Physician: Shela Leff [id number]       B Medical/Surgery History Past Medical History:  Diagnosis Date  . Blind   . Disorder of bone and cartilage, unspecified   . Edema   . Elevated prostate specific antigen (PSA)   . Hypertension   . Hypopotassemia   . Nonspecific abnormal results of liver function study   . Unspecified glaucoma(365.9)    Past Surgical History:  Procedure Laterality Date  . CYSTOSCOPY WITH FULGERATION N/A 01/17/2018   Procedure: CYSTOSCOPY CLOT EVACUATION WITH FULGERATION TUR BIOPSY OF BLADDER NECK;  Surgeon: Irine Seal, MD;  Location: [facility];  Service: Urology;  Laterality: N/A;  . EYE SURGERY Bilateral 2009   for glaucoma  . EYE  SURGERY Left 2014  . HERNIA REPAIR Right 2003   Dr. Jeraldine Loots  . LAPAROSCOPIC APPENDECTOMY N/A 12/24/2012   Procedure:  LAPAROSCOPIC APPENDECTOMY ;  Surgeon: Pedro Earls, MD;  Location: [facility];  Service: General;  Laterality: N/A;     A IV Location/Drains/Wounds Patient Lines/Drains/Airways Status   Active Line/Drains/Airways    Name:   Placement date:   Placement time:   Site:   Days:   Peripheral IV 05/25/19 Left Antecubital   05/25/19    0400    Antecubital   less than 1   Peripheral IV 05/25/19 Right Hand   05/25/19    0651    Hand   less than 1          Intake/Output Last 24 hours  Intake/Output Summary (Last 24 hours) at 05/25/2019 1011 Last data filed at 05/25/2019 1008 Gross per 24 hour  Intake 1634 ml  Output --  Net 1634 ml    Labs/Imaging Results for orders placed or performed during the hospital encounter of 05/25/19 (from the past 48 hour(s))  Sample to Blood Bank     Status: None   Collection Time: 05/25/19  1:59 AM  Result Value Ref Range   Blood Bank Specimen SAMPLE AVAILABLE FOR TESTING    Sample Expiration      05/26/2019,2359 Performed at [facility], [place] [address] [place], [place] [address]   Type and screen     Status: None (Preliminary result)   Collection Time: 05/25/19  1:59 AM  Result Value Ref Range   ABO/RH(D) B POS  Antibody Screen NEG    Sample Expiration 05/28/2019,2359    Unit Number [id number]    Blood Component Type RBC LR PHER2    Unit division 00    Status of Unit ALLOCATED    Transfusion Status OK TO TRANSFUSE    Crossmatch Result Compatible    Unit Number [id number]    Blood Component Type RBC LR PHER1    Unit division 00    Status of Unit ISSUED    Transfusion Status OK TO TRANSFUSE    Crossmatch Result      Compatible Performed at [facility], [place] [address] [place], [place] [address]    Unit Number [id number]    Blood Component Type RED CELLS,LR    Unit division 00    Status of  Unit ISSUED    Transfusion Status OK TO TRANSFUSE    Crossmatch Result Compatible   ABO/Rh     Status: None (Preliminary result)   Collection Time: 05/25/19  1:59 AM  Result Value Ref Range   ABO/RH(D)      B POS Performed at [facility], [place] [address], [place], [place] [address]   Comprehensive metabolic panel     Status: Abnormal   Collection Time: 05/25/19  2:07 AM  Result Value Ref Range   Sodium 135 135 - 145 mmol/L   Potassium 3.0 (L) 3.5 - 5.1 mmol/L   Chloride 100 98 - 111 mmol/L   CO2 20 (L) 22 - 32 mmol/L   Glucose, Bld 148 (H) 70 - 99 mg/dL   BUN 17 8 - 23 mg/dL   Creatinine, Ser 1.31 (H) 0.61 - 1.24 mg/dL   Calcium 8.3 (L) 8.9 - 10.3 mg/dL   Total Protein 6.2 (L) 6.5 - 8.1 g/dL   Albumin 3.1 (L) 3.5 - 5.0 g/dL   [name] 24 15 - 41 U/L   ALT 20 0 - 44 U/L   Alkaline Phosphatase 49 38 - 126 U/L   Total Bilirubin 1.0 0.3 - 1.2 mg/dL   GFR calc non Af Amer 54 (L) >60 mL/min   GFR calc Af Amer >60 >60 mL/min   Anion gap 15 5 - 15    Comment: Performed at [facility], [place] [address] [place] [address]  CBC     Status: Abnormal   Collection Time: 05/25/19  2:07 AM  Result Value Ref Range   WBC 11.5 (H) 4.0 - 10.5 K/uL   RBC 1.86 (L) 4.22 - 5.81 MIL/uL   Hemoglobin 4.9 (LL) 13.0 - 17.0 g/dL    Comment: REPEATED TO VERIFY THIS CRITICAL RESULT HAS VERIFIED AND BEEN CALLED TO RN ANGELA SENDERS BY MESSAN HOUEGNIFIO ON 12 20 2020 AT [id number], AND HAS BEEN READ BACK.     HCT 16.1 (L) 39.0 - 52.0 %   MCV 86.6 80.0 - 100.0 fL   MCH 26.3 26.0 - 34.0 pg   MCHC 30.4 30.0 - 36.0 g/dL   RDW 16.7 (H) 11.5 - 15.5 %   Platelets 370 150 - 400 K/uL   nRBC 0.6 (H) 0.0 - 0.2 %    Comment: Performed at [facility] [address], [place], [place] [address]  Prepare RBC     Status: None   Collection Time: 05/25/19  3:07 AM  Result Value Ref Range   Order Confirmation      ORDER PROCESSED BY BLOOD BANK Performed at [facility], [place],  [place] [address]   Urinalysis, Routine w reflex microscopic     Status: Abnormal   Collection Time: 05/25/19  4:36 AM  Result Value Ref Range   Color, Urine RED (A) YELLOW    Comment: BIOCHEMICALS MAY BE AFFECTED BY COLOR   APPearance TURBID (A) CLEAR   Specific Gravity, Urine 1.020 1.005 - 1.030   pH 7.5 5.0 - 8.0   Glucose, UA 250 (A) NEGATIVE mg/dL   Hgb urine dipstick LARGE (A) NEGATIVE   Bilirubin Urine NEGATIVE NEGATIVE   Ketones, ur 15 (A) NEGATIVE mg/dL   Protein, ur >300 (A) NEGATIVE mg/dL   Nitrite POSITIVE (A) NEGATIVE   Leukocytes,Ua TRACE (A) NEGATIVE    Comment: Performed at [facility] [address] [place], [place] [address]  Urinalysis, Microscopic (reflex)     Status: Abnormal   Collection Time: 05/25/19  4:36 AM  Result Value Ref Range   RBC / HPF >50 0 - 5 RBC/hpf   WBC, UA 0-5 0 - 5 WBC/hpf   Bacteria, UA RARE (A) NONE SEEN   Squamous Epithelial / LPF 0-5 0 - 5    Comment: Performed at [facility], [place] [address], [place], [place] [address]  Protime-INR     Status: None   Collection Time: 05/25/19  4:50 AM  Result Value Ref Range   Prothrombin Time 14.6 11.4 - 15.2 seconds   INR 1.1 0.8 - 1.2    Comment: (NOTE) INR goal varies based on device and disease states. Performed at [facility], [place] [address], [place], [place] [address]   Magnesium     Status: None   Collection Time: 05/25/19  6:55 AM  Result Value Ref Range   Magnesium 2.1 1.7 - 2.4 mg/dL    Comment: Performed at [facility] [address], [place], [place] [address]   US RENAL  Result Date: 05/25/2019 CLINICAL DATA:  Hematuria EXAM: RENAL / URINARY TRACT ULTRASOUND COMPLETE COMPARISON:  01/14/2018 FINDINGS: Right Kidney: Renal measurements: 11.8 x 6.9 x 6.2 cm = volume: 266 mL. Upper and lower pole cysts measuring up to 5.4 cm at the lower pole Left Kidney: Renal measurements: 10 x 5 x 5 cm = volume: 140 mL. Despite smaller size no appreciable cortical  thinning, some of the asymmetry may be due to angle of imaging. No hydronephrosis. Central cyst measuring up to 4.7 cm Bladder: Incomplete distension of the bladder with mobile avascular echogenic material that is most consistent with clot. There is mass along the bladder base with bladder up lifting, correlating with history of massive BPH. Grossly stable appearance when compared with 2018 CT. IMPRESSION: 1. Mobile material in the bladder most consistent with blood clot. The bladder is not over distended. 2. Massive enlargement of the prostate. Electronically Signed   By: Monte Fantasia M.D.   On: 05/25/2019 08:02    Pending Labs Unresulted Labs (From admission, onward)    Start     Ordered   05/26/19 [id number]  Basic metabolic panel  Tomorrow morning,   R     05/25/19 0623   05/25/19 0710  CBC  Once,   R     05/25/19 0710   05/25/19 0651  Iron and TIBC  Once,   STAT     05/25/19 0650   05/25/19 0651  Ferritin  Once,   STAT     05/25/19 0650   05/25/19 0355  SARS CORONAVIRUS 2 (TAT 6-24 HRS) Nasopharyngeal Nasopharyngeal  Swab  (Tier 3 (TAT 6-24 hrs))  Once,   STAT    Question Answer Comment  Is this test for diagnosis or screening Screening   Symptomatic for COVID-19 as defined by CDC No   Hospitalized for COVID-19 No   Admitted to ICU for COVID-19 No   Previously tested for COVID-19 No   Resident in a congregate (group) care setting No   Employed in healthcare setting No      05/25/19 0354   05/25/19 0303  Urine culture  Once,   STAT     05/25/19 0302          Vitals/Pain Today's Vitals   05/25/19 0830 05/25/19 0845 05/25/19 0915 05/25/19 1006  BP: 111/70 111/65 115/63 104/65  Pulse:   81 74  Resp: 14 14 15 14   Temp:    99 F (37.2 C)  TempSrc:    Oral  SpO2:   100%   Weight:      Height:      PainSc:        Isolation Precautions No active isolations  Medications Medications  vitamin B-12 (CYANOCOBALAMIN) tablet 1,000 mcg (has no administration in time range)  folic  acid (FOLVITE) tablet 1 mg (has no administration in time range)  iron polysaccharides (NIFEREX) capsule 150 mg (has no administration in time range)  pyridOXINE (VITAMIN B-6) tablet 100 mg (has no administration in time range)  ascorbic acid (VITAMIN C) tablet 1,000 mg (has no administration in time range)  brimonidine (ALPHAGAN) 0.2 % ophthalmic solution 1 drop (has no administration in time range)  dorzolamide-timolol (COSOPT) 22.3-6.8 MG/ML ophthalmic solution 1 drop (has no administration in time range)  pilocarpine (PILOCAR) 2 % ophthalmic solution 1 drop (has no administration in time range)  acetaminophen (TYLENOL) tablet 650 mg (has no administration in time range)    Or  acetaminophen (TYLENOL) suppository 650 mg (has no administration in time range)  0.9 %  sodium chloride infusion ( Intravenous New Bag/Given 05/25/19 0757)  potassium chloride SA (KLOR-CON) CR tablet 40 mEq (has no administration in time range)  sodium chloride flush (NS) 0.9 % injection 3 mL (3 mLs Intravenous Given 05/25/19 0600)  0.9 %  sodium chloride infusion (10 mL/hr Intravenous New Bag/Given 05/25/19 0655)  cefTRIAXone (ROCEPHIN) 1 g in sodium chloride 0.9 % 100 mL IVPB (0 g Intravenous Stopped 05/25/19 0752)    Mobility walks with device Low fall risk   Focused Assessments    R Recommendations: See Admitting Provider Note  Report given to: Rondell Reams RN  Additional Notes:

## 2019-05-25 NOTE — ED Notes (Signed)
Condom cath placed on pt. Pt urinated immediately after. Urine had frank blood. MD aware

## 2019-05-25 NOTE — H&P (Signed)
History and Physical    Aldean Ast. [id number] DOB: 01-21-1946 DOA: 05/25/2019  PCP: [name], NP Patient coming from: Home  Chief Complaint: Hematuria  HPI: [name] [name]. is a 73 y.o. male with medical history significant of BPH, elevated PSA and previously refused prostate biopsy, hematuria, hypertension, glaucoma, legally blind presented to the ED with complaints of hematuria.  Patient reports 1 week history of gross hematuria.  He has been feeling tired and having lightheadedness this past week.  No chest pain or shortness of breath.  He is not on any blood thinners.  He has not seen urology since his last hospitalization for hematuria.  States he was started on finasteride during his previous hospitalization but stopped taking the medication after his urine cleared up.  Does report straining with urination.   Of note, patient was admitted to the hospital in August 2019 for gross hematuria and hemoglobin 3.8.  He underwent cystoscopy which revealed a 1.5 cm papillary lesion of the left anterior bladder neck status post TUR biopsy which was negative for malignancy.  ED Course: Hemodynamically stable. Hemoglobin 4.9.  Urine grossly bloody showing large amount of hemoglobin, positive nitrite, trace amount of leukocytes, and 0-5 WBCs.  Urine culture pending. Patient received ceftriaxone.  3 units PRBCs ordered.  Review of Systems:  All systems reviewed and apart from history of presenting illness, are negative.  Past Medical History:  Diagnosis Date  . Blind   . Disorder of bone and cartilage, unspecified   . Edema   . Elevated prostate specific antigen (PSA)   . Hypertension   . Hypopotassemia   . Nonspecific abnormal results of liver function study   . Unspecified glaucoma(365.9)     Past Surgical History:  Procedure Laterality Date  . CYSTOSCOPY WITH FULGERATION N/A 01/17/2018   Procedure: CYSTOSCOPY CLOT EVACUATION WITH FULGERATION TUR BIOPSY OF  BLADDER NECK;  Surgeon: Irine Seal, MD;  Location: [facility];  Service: Urology;  Laterality: N/A;  . EYE SURGERY Bilateral 2009   for glaucoma  . EYE SURGERY Left 2014  . HERNIA REPAIR Right 2003   Dr. Jeraldine Loots  . LAPAROSCOPIC APPENDECTOMY N/A 12/24/2012   Procedure:  LAPAROSCOPIC APPENDECTOMY ;  Surgeon: Pedro Earls, MD;  Location: [facility];  Service: General;  Laterality: N/A;     reports that he has never smoked. He has never used smokeless tobacco. He reports that he does not drink alcohol or use drugs.  No Known Allergies  Family History  Problem Relation Age of Onset  . Kidney failure Mother     Prior to Admission medications   Medication Sig Start Date End Date Taking? Authorizing Provider  brimonidine (ALPHAGAN) 0.2 % ophthalmic solution Place 1 drop into both eyes 2 (two) times daily. 03/11/19  Yes [provider]  cyanocobalamin 500 MCG tablet Take 1,000 mcg by mouth every morning.    Yes [provider]  dorzolamide-timolol (COSOPT) 22.3-6.8 MG/ML ophthalmic solution APPOINTMENT OVERDUE, instill 1 drop in both eyes twice daily Patient taking differently: Place 1 drop into both eyes 2 (two) times daily.  07/14/13  Yes [name], NP  folic acid (FOLVITE) [id number] MCG tablet Take 400 mcg by mouth daily in the afternoon.    Yes [provider]  hydrochlorothiazide (HYDRODIURIL) 50 MG tablet Take 12.5 mg by mouth daily.   Yes [provider]  iron polysaccharides (POLY-IRON 150) 150 MG capsule Take 150 mg by mouth daily.   Yes [provider]  Magnesium 250 MG TABS Take 250 mg by mouth as needed (for headaches).    Yes [provider]  pilocarpine (PILOCAR) 2 % ophthalmic solution Place 1 drop into the right eye 4 (four) times daily.  12/25/17  Yes [provider]  potassium chloride SA (K-DUR,KLOR-CON) 20 MEQ tablet Take 1 tablet (20 mEq total) by mouth daily. 01/24/18  Yes Ngetich, Dinah C, NP  pyridOXINE  (VITAMIN B-6) 100 MG tablet Take 100 mg by mouth every morning.   Yes [provider]  vitamin C (ASCORBIC ACID) 500 MG tablet Take 1,000 mg by mouth every morning.    Yes [provider]  finasteride (PROSCAR) 5 MG tablet Take 1 tablet (5 mg total) by mouth daily. Patient not taking: Reported on 05/25/2019 01/19/18   Dana Allan I, MD  furosemide (LASIX) 20 MG tablet Take 1 tablet (20 mg total) by mouth daily. Patient not taking: Reported on 05/25/2019 01/24/18   Ngetich, Dinah C, NP  iron polysaccharides (NIFEREX) 150 MG capsule Take 1 capsule (150 mg total) by mouth daily. Patient not taking: Reported on 05/25/2019 01/19/18   Bonnell Public, MD    Physical Exam: Vitals:   05/25/19 0445 05/25/19 0515 05/25/19 0545 05/25/19 0600  BP: 130/69 120/69 122/67 120/67  Pulse:      Resp: 16 16 16 16   Temp:      TempSrc:      SpO2:      Weight:      Height:        Physical Exam  Constitutional: He is oriented to person, place, and time. He appears well-developed and well-nourished. No distress.  HENT:  Head: Normocephalic.  Eyes: Right eye exhibits no discharge. Left eye exhibits no discharge.  Cardiovascular: Normal rate, regular rhythm and intact distal pulses.  Pulmonary/Chest: Effort normal and breath sounds normal. No respiratory distress. He has no wheezes. He has no rales.  Abdominal: Soft. Bowel sounds are normal. He exhibits no distension. There is no abdominal tenderness. There is no guarding.  Musculoskeletal:        General: No edema.     Cervical back: Neck supple.  Neurological: He is alert and oriented to person, place, and time.  Skin: Skin is warm and dry. He is not diaphoretic.     Labs on Admission: I have personally reviewed following labs and imaging studies  CBC: Recent Labs  Lab 05/25/19 0207  WBC 11.5*  HGB 4.9*  HCT 16.1*  MCV 86.6  PLT 0000000   Basic Metabolic Panel: Recent Labs  Lab 05/25/19 0207  NA 135  K 3.0*  CL  100  CO2 20*  GLUCOSE 148*  BUN 17  CREATININE 1.31*  CALCIUM 8.3*   GFR: Estimated Creatinine Clearance: 54.9 mL/min (A) (by C-G formula based on SCr of 1.31 mg/dL (H)). Liver Function Tests: Recent Labs  Lab 05/25/19 0207  AST 24  ALT 20  ALKPHOS 49  BILITOT 1.0  PROT 6.2*  ALBUMIN 3.1*   No results for input(s): LIPASE, AMYLASE in the last 168 hours. No results for input(s): AMMONIA in the last 168 hours. Coagulation Profile: Recent Labs  Lab 05/25/19 0450  INR 1.1   Cardiac Enzymes: No results for input(s): CKTOTAL, CKMB, CKMBINDEX, TROPONINI in the last 168 hours. BNP (last 3 results) No results for input(s): PROBNP in the last 8760 hours. HbA1C: No results for input(s): HGBA1C in the last 72 hours. CBG: No results for input(s): GLUCAP in the last  168 hours. Lipid Profile: No results for input(s): CHOL, HDL, LDLCALC, TRIG, CHOLHDL, LDLDIRECT in the last 72 hours. Thyroid Function Tests: No results for input(s): TSH, T4TOTAL, FREET4, T3FREE, THYROIDAB in the last 72 hours. Anemia Panel: No results for input(s): VITAMINB12, FOLATE, FERRITIN, TIBC, IRON, RETICCTPCT in the last 72 hours. Urine analysis:    Component Value Date/Time   COLORURINE RED (A) 05/25/2019 0436   APPEARANCEUR TURBID (A) 05/25/2019 0436   LABSPEC 1.020 05/25/2019 0436   PHURINE 7.5 05/25/2019 0436   GLUCOSEU 250 (A) 05/25/2019 0436   HGBUR LARGE (A) 05/25/2019 0436   BILIRUBINUR NEGATIVE 05/25/2019 0436   KETONESUR 15 (A) 05/25/2019 0436   PROTEINUR >300 (A) 05/25/2019 0436   UROBILINOGEN 0.2 12/27/2012 2241   NITRITE POSITIVE (A) 05/25/2019 0436   LEUKOCYTESUR TRACE (A) 05/25/2019 0436    Radiological Exams on Admission: No results found.  Assessment/Plan Principal Problem:   Hematuria Active Problems:   Anemia due to blood loss, acute   Hypokalemia   AKI (acute kidney injury) ([place])   BPH (benign prostatic hyperplasia)   Gross hematuria Urine is grossly bloody with large  amount of hemoglobin. Patient was admitted in August 2019 for gross hematuria and cystoscopy at that time revealed a 1.5 cm papillary lesion of the left anterior bladder neck status post TUR biopsy which was negative for malignancy.  It seems patient has not followed up with urology since his prior hospitalization.  He is not on any antiplatelet agents/anticoagulants. -Renal ultrasound for evaluation of the bladder -Consult urology in a.m. for cystoscopy  Symptomatic acute blood loss anemia secondary to gross hematuria Hemodynamically stable. Hemoglobin 4.9 and MCV normal.  Patient endorses fatigue and lightheadedness. -Type and screen -3 units PRBCs ordered in the ED -Posttransfusion H&H -Management of gross hematuria as mentioned above -Continue home iron supplement -Check iron, ferritin, TIBC  ?UTI UA with positive nitrite, trace amount of leukocytes, and 0-5 WBCs.  WBC count mildly elevated 11.5. -Received a dose of ceftriaxone in the ED.  Urine culture pending. -Continue to monitor WBC count  AKI Creatinine 1.3, baseline 0.8-1.0.  Takes diuretics at home. -IV fluid hydration -Continue to monitor renal function and urine output -Hold diuretics at this time  BPH It seems patient stopped taking finasteride after his last hospitalization in 2019.  Does endorse straining with urination.  Renal ultrasound done in August 2019 showing marked enlargement of the prostate gland. -Resume finasteride  Hypokalemia Potassium 3.0.  Takes diuretics at home. -Replete potassium.  Check magnesium level and replete if low.  Continue to monitor electrolytes.  Glaucoma -Continue home medications  DVT prophylaxis: SCDs Code Status: Full code.  Discussed with the patient. Family Communication: No family available at this time. Disposition Plan: Anticipate discharge after clinical improvement. Consults called: None Admission status: It is my clinical opinion that referral for OBSERVATION is  reasonable and necessary in this patient based on the above information provided. The aforementioned taken together are felt to place the patient at high risk for further clinical deterioration. However it is anticipated that the patient may be medically stable for discharge from the hospital within 24 to 48 hours.  The medical decision making on this patient was of high complexity and the patient is at high risk for clinical deterioration, therefore this is a level 3 visit.  Shela Leff MD [facility] Pager [phone]  If 7PM-7AM, please contact night-coverage [email or web address] Password [email or web address]  05/25/2019, 6:50 AM

## 2019-05-25 NOTE — ED Triage Notes (Signed)
The pt is c/o bloody ueinr for one week  He has some sl burning when he  Urinates right now no pain  He is visually impaired

## 2019-05-25 NOTE — ED Notes (Signed)
[place] to come to bedside to obtain scan due to pt getting blood

## 2019-05-26 DIAGNOSIS — D62 Acute posthemorrhagic anemia: Secondary | ICD-10-CM | POA: Diagnosis present

## 2019-05-26 DIAGNOSIS — N3289 Other specified disorders of bladder: Secondary | ICD-10-CM | POA: Diagnosis present

## 2019-05-26 DIAGNOSIS — R310 Gross hematuria: Secondary | ICD-10-CM | POA: Diagnosis not present

## 2019-05-26 DIAGNOSIS — B955 Unspecified streptococcus as the cause of diseases classified elsewhere: Secondary | ICD-10-CM | POA: Diagnosis present

## 2019-05-26 DIAGNOSIS — Z79899 Other long term (current) drug therapy: Secondary | ICD-10-CM | POA: Diagnosis not present

## 2019-05-26 DIAGNOSIS — H548 Legal blindness, as defined in USA: Secondary | ICD-10-CM | POA: Diagnosis present

## 2019-05-26 DIAGNOSIS — Z20828 Contact with and (suspected) exposure to other viral communicable diseases: Secondary | ICD-10-CM | POA: Diagnosis present

## 2019-05-26 DIAGNOSIS — R338 Other retention of urine: Secondary | ICD-10-CM

## 2019-05-26 DIAGNOSIS — N179 Acute kidney failure, unspecified: Secondary | ICD-10-CM

## 2019-05-26 DIAGNOSIS — I10 Essential (primary) hypertension: Secondary | ICD-10-CM | POA: Diagnosis present

## 2019-05-26 DIAGNOSIS — H409 Unspecified glaucoma: Secondary | ICD-10-CM | POA: Diagnosis present

## 2019-05-26 DIAGNOSIS — N401 Enlarged prostate with lower urinary tract symptoms: Secondary | ICD-10-CM | POA: Diagnosis present

## 2019-05-26 DIAGNOSIS — E876 Hypokalemia: Secondary | ICD-10-CM | POA: Diagnosis present

## 2019-05-26 DIAGNOSIS — Z841 Family history of disorders of kidney and ureter: Secondary | ICD-10-CM | POA: Diagnosis not present

## 2019-05-26 DIAGNOSIS — N390 Urinary tract infection, site not specified: Secondary | ICD-10-CM | POA: Diagnosis present

## 2019-05-26 DIAGNOSIS — N138 Other obstructive and reflux uropathy: Secondary | ICD-10-CM | POA: Diagnosis present

## 2019-05-26 LAB — BASIC METABOLIC PANEL
Anion gap: 10 (ref 5–15)
BUN: 16 mg/dL (ref 8–23)
CO2: 21 mmol/L — ABNORMAL LOW (ref 22–32)
Calcium: 7.6 mg/dL — ABNORMAL LOW (ref 8.9–10.3)
Chloride: 109 mmol/L (ref 98–111)
Creatinine, Ser: 1.23 mg/dL (ref 0.61–1.24)
GFR calc Af Amer: >60 mL/min (ref >60)
GFR calc non Af Amer: 58 mL/min — ABNORMAL LOW (ref >60)
Glucose, Bld: 127 mg/dL — ABNORMAL HIGH (ref 70–99)
Potassium: 3.9 mmol/L (ref 3.5–5.1)
Sodium: 140 mmol/L (ref 135–145)

## 2019-05-26 LAB — PREPARE RBC (CROSSMATCH)

## 2019-05-26 LAB — HEMOGLOBIN AND HEMATOCRIT, BLOOD
HCT: 23.4 % — ABNORMAL LOW (ref 39.0–52.0)
HCT: 19.9 % — ABNORMAL LOW (ref 39.0–52.0)
Hemoglobin: 7.5 g/dL — ABNORMAL LOW (ref 13.0–17.0)
Hemoglobin: 6.5 g/dL — CL (ref 13.0–17.0)

## 2019-05-26 MED ORDER — SODIUM CHLORIDE 0.9 % IV SOLN: INTRAVENOUS | Status: DC

## 2019-05-26 MED ORDER — SODIUM CHLORIDE 0.9% IV SOLUTION: Freq: Once | INTRAVENOUS | Status: DC

## 2019-05-26 MED ORDER — BELLADONNA ALKALOIDS-OPIUM 16.2-60 MG RE SUPP
1.0000 | Freq: Three times a day (TID) | RECTAL | Status: DC | PRN
  Administered 2019-05-26: 1 via RECTAL
  Filled 2019-05-26: qty 1

## 2019-05-26 MED ORDER — MIRABEGRON ER 25 MG PO TB24
50.0000 mg | ORAL_TABLET | Freq: Every day | ORAL | Status: DC
  Administered 2019-05-27 – 2019-05-28 (×2): 50 mg via ORAL
  Filled 2019-05-26 (×3): qty 2

## 2019-05-26 MED ORDER — OXYBUTYNIN CHLORIDE 5 MG PO TABS
5.0000 mg | ORAL_TABLET | Freq: Three times a day (TID) | ORAL | Status: DC
  Administered 2019-05-26 – 2019-05-28 (×4): 5 mg via ORAL
  Filled 2019-05-26 (×6): qty 1

## 2019-05-26 NOTE — Progress Notes (Signed)
CRITICAL VALUE ALERT  Critical Value:  HGB 6.5  Date & Time Notied:  12/21 1932  Provider Notified: [facility] Baltazar Najjar  Orders Received/Actions taken: awaiting

## 2019-05-26 NOTE — Progress Notes (Signed)
Patient complaining of leaking around his catheter, prn meds were given for bladder spasm . Noted that catheter is not draining well, multiple clots noted on the catheter bag, foley irrigated but no return noted. Md made ware, urology called and this writer was notified that they will talked to the MD that have seen the patient, while waiting patient demanded that foley needs to be pulled out, patient very upset despite staff explaining to him our interventions and continue to ask staff that foley needs to be pulled out. [name] [name] pulled foley out.at 58, urology called and ordered to keep foley out.t

## 2019-05-26 NOTE — Treatment Plan (Signed)
Urology Treatment Plan  Patient continued to have bladder spasms this morning, unlikely that catheter was obstructed given this morning, with hand irrigation at bedside, the patient had two bladder spasms and bladder was empty. Therefore, suspect bladder spasms were the etiology of leakage around his catheter earlier, precipitated by the catheter itself. Patient demanded catheter be removed - It was.  Patient perseverating on prior catheter placements. Discussed pathophysiology of bladder spasms and clot retention. Refuses to believe/attempt to understand pathophysiology of bladder spasms. I have offered to replace his catheter at this time, perform hand irrigation of any possible clot and that we will attempt to treat his spasms medically. He refuses to have catheter replaced.  He continues to void (into condom catheter at this time). Most recent PVR was 133ml (in the setting of a >500cc prostate). Unclear his baseline voiding characteristics but suspect he has some degree of baseline elevated residual.   He understands the risk of clot urinary retention and persistent hematuria as well as possible need for on going blood transfusions.

## 2019-05-26 NOTE — Progress Notes (Signed)
PROGRESS NOTE    [name] [name].  [id number] DOB: 03/07/1946 DOA: 05/25/2019 PCP: [name], NP    Brief Narrative: 73 year old gentleman prior history of BPH, elevated PSA hypertension, glaucoma, legally blind presents with hematuria associated with lightheadedness.  On arrival to ED his hemoglobin was found to be around 4.  Previously he underwent cystoscopy was found to have a 1.5 papillary lesion of the left anterior bladder neck s/p TUR biopsy which was negative for malignancy.  This admission urology consulted when urethral dilatation with Foley catheter insertion and bladder irrigation.  Earlier this morning patient had severe pain and the catheter was not draining despite flushes and patient wanted the Foley catheter to be removed and it was removed as per his request.  On-call urologist was notified recommended to keep the patient n.p.o. and watch him to see if he urinates.    Assessment & Plan:   Principal Problem:   Hematuria Active Problems:   Anemia due to blood loss, acute   Hypokalemia   AKI (acute kidney injury) ([place])   BPH (benign prostatic hyperplasia)   Gross hematuria Unclear etiology Urology consulted and he underwent urethral dilatation with Foley catheter insertion and bladder regular irrigation.  Unfortunately earlier this morning patient had severe pain and catheter was not draining despite flushes and patient requested the Foley catheter to be removed and it was removed as per his request.  On-call urologist was notified who recommended to keep the patient n.p.o. and watch him and notify her if he urinates.  He was started on gentle hydration. Continue with oxybutynin, Myrbetriq and finasteride as per urology. Pain control and IV Rocephin.    Acute anemia of blood loss secondary to hematuria S/p 3 units of PRBC transfusions and his hemoglobin earlier this morning was 7.4. Transfuse to keep hemoglobin greater than 7. iron is 38 and  ferritin is 11.     Urinary tract infection urinalysis with positive nitrates and trace leukocytes Urine cultures are pending and continue with Rocephin.    Acute kidney injury creatinine 1.3 on admission with baseline of around 0.8-1. Gently hydrate and repeat renal parameters in the morning.   BPH Continue with finasteride.    Hypokalemia Replaced    DVT prophylaxis: SCDs Code Status: (Full code Family Communication: None at bedside Disposition Plan: Pending clinical improvement and further evaluation and improvement of the hematuria   Consultants:   Urology  Procedures: None Antimicrobials: rocephin since admission.   Subjective: Frustrated and wanted the three way catheter out. Requested [place] to remove the catheter.   Objective: Vitals:   05/25/19 1306 05/25/19 1452 05/25/19 2032 05/26/19 0425  BP: 117/73 113/77 112/68 134/87  Pulse: 92 70 79 89  Resp: 16 16 14 16   Temp: 98.8 F (37.1 C) 98.8 F (37.1 C) 98 F (36.7 C) 97.6 F (36.4 C)  TempSrc: Oral Oral Oral Oral  SpO2:  100% 100% 100%  Weight:      Height:        Intake/Output Summary (Last 24 hours) at 05/26/2019 1001 Last data filed at 05/26/2019 0650 Gross per 24 hour  Intake 17374.13 ml  Output 18675 ml  Net -1300.87 ml   Filed Weights   05/25/19 0139  Weight: 90.7 kg    Examination:  General exam: in mod distress from pain due to catheter.  Respiratory system: Clear to auscultation. Respiratory effort normal. Cardiovascular system: S1 & S2 heard, RRR. Gastrointestinal system: Abdomen is soft, tender in the supra pubic area.  Central nervous system: Alert and oriented.  Extremities: no pedal edema.  Skin: No rashes, lesions or ulcers Psychiatry: anxious.      Data Reviewed: I have personally reviewed following labs and imaging studies  CBC: Recent Labs  Lab 05/25/19 0207 05/25/19 2103  WBC 11.5* 12.0*  HGB 4.9* 7.4*  HCT 16.1* 22.8*  MCV 86.6 86.0  PLT 370 0000000    Basic Metabolic Panel: Recent Labs  Lab 05/25/19 0207 05/25/19 0655 05/26/19 0721  NA 135  --  140  K 3.0*  --  3.9  CL 100  --  109  CO2 20*  --  21*  GLUCOSE 148*  --  127*  BUN 17  --  16  CREATININE 1.31*  --  1.23  CALCIUM 8.3*  --  7.6*  MG  --  2.1  --    GFR: Estimated Creatinine Clearance: 58.5 mL/min (by C-G formula based on SCr of 1.23 mg/dL). Liver Function Tests: Recent Labs  Lab 05/25/19 0207  [name] 24  ALT 20  ALKPHOS 49  BILITOT 1.0  PROT 6.2*  ALBUMIN 3.1*   No results for input(s): LIPASE, AMYLASE in the last 168 hours. No results for input(s): AMMONIA in the last 168 hours. Coagulation Profile: Recent Labs  Lab 05/25/19 0450  INR 1.1   Cardiac Enzymes: No results for input(s): CKTOTAL, CKMB, CKMBINDEX, TROPONINI in the last 168 hours. BNP (last 3 results) No results for input(s): PROBNP in the last 8760 hours. HbA1C: No results for input(s): HGBA1C in the last 72 hours. CBG: No results for input(s): GLUCAP in the last 168 hours. Lipid Profile: No results for input(s): CHOL, HDL, LDLCALC, TRIG, CHOLHDL, LDLDIRECT in the last 72 hours. Thyroid Function Tests: No results for input(s): TSH, T4TOTAL, FREET4, T3FREE, THYROIDAB in the last 72 hours. Anemia Panel: Recent Labs    05/25/19 1816  FERRITIN 11*  TIBC 371  IRON 38*   Sepsis Labs: No results for input(s): PROCALCITON, LATICACIDVEN in the last 168 hours.  Recent Results (from the past 240 hour(s))  SARS CORONAVIRUS 2 (TAT 6-24 HRS) Nasopharyngeal Nasopharyngeal Swab     Status: None   Collection Time: 05/25/19  4:20 AM   Specimen: Nasopharyngeal Swab  Result Value Ref Range Status   SARS Coronavirus 2 NEGATIVE NEGATIVE Final    Comment: (NOTE) SARS-CoV-2 target nucleic acids are NOT DETECTED. The SARS-CoV-2 RNA is generally detectable in upper and lower respiratory specimens during the acute phase of infection. Negative results do not preclude SARS-CoV-2 infection, do not  rule out co-infections with other pathogens, and should not be used as the sole basis for treatment or other patient management decisions. Negative results must be combined with clinical observations, patient history, and epidemiological information. The expected result is Negative. Fact Sheet for Patients: SugarRoll.be Fact Sheet for Healthcare Providers: https://www.woods-mathews.com/ This test is not yet approved or cleared by the [place] FDA and  has been authorized for detection and/or diagnosis of SARS-CoV-2 by FDA under an Emergency Use Authorization (EUA). This EUA will remain  in effect (meaning this test can be used) for the duration of the COVID-19 declaration under Section 56 4(b)(1) of the Act, 21 U.S.C. section 360bbb-3(b)(1), unless the authorization is terminated or revoked sooner. Performed at [facility], [place] [address], [place], [place] [address]   Urine culture     Status: None (Preliminary result)   Collection Time: 05/25/19  4:36 AM   Specimen: Urine, Clean Catch  Result Value  Ref Range Status   Specimen Description URINE, CLEAN CATCH  Final   Special Requests NONE  Final   Culture   Final    CULTURE REINCUBATED FOR BETTER GROWTH Performed at [facility], [place] [address] [place], [place] [address]    Report Status PENDING  Incomplete         Radiology Studies: US RENAL  Result Date: 05/25/2019 CLINICAL DATA:  Hematuria EXAM: RENAL / URINARY TRACT ULTRASOUND COMPLETE COMPARISON:  01/14/2018 FINDINGS: Right Kidney: Renal measurements: 11.8 x 6.9 x 6.2 cm = volume: 266 mL. Upper and lower pole cysts measuring up to 5.4 cm at the lower pole Left Kidney: Renal measurements: 10 x 5 x 5 cm = volume: 140 mL. Despite smaller size no appreciable cortical thinning, some of the asymmetry may be due to angle of imaging. No hydronephrosis. Central cyst measuring up to 4.7 cm Bladder: Incomplete distension of  the bladder with mobile avascular echogenic material that is most consistent with clot. There is mass along the bladder base with bladder up lifting, correlating with history of massive BPH. Grossly stable appearance when compared with 2018 CT. IMPRESSION: 1. Mobile material in the bladder most consistent with blood clot. The bladder is not over distended. 2. Massive enlargement of the prostate. Electronically Signed   By: Monte Fantasia M.D.   On: 05/25/2019 08:02        Scheduled Meds: . vitamin C  1,000 mg Oral q morning - 10a  . brimonidine  1 drop Both Eyes BID  . Chlorhexidine Gluconate Cloth  6 each Topical Daily  . dorzolamide-timolol  1 drop Both Eyes BID  . finasteride  5 mg Oral Daily  . folic acid  1 mg Oral Daily  . iron polysaccharides  150 mg Oral Daily  . mirabegron ER  50 mg Oral Daily  . oxybutynin  5 mg Oral TID  . pilocarpine  1 drop Right Eye TID AC & HS  . pyridOXINE  100 mg Oral q morning - 10a  . vitamin B-12  1,000 mcg Oral q morning - 10a   Continuous Infusions: . cefTRIAXone (ROCEPHIN)  IV 1 g (05/26/19 0742)  . sodium chloride irrigation       LOS: 0 days       Hosie Poisson, MD [facility] 05/26/2019, 10:01 AM

## 2019-05-26 NOTE — Progress Notes (Signed)
Urology Consult Progress Note   Subjective: Post transfusion 3 unit hemoglobin yesterday evening was 7.4.  No morning hemoglobin recheck yet  Patient reports doing well overnight he is very pleased.  He has had leaking around the catheter.  The catheter continues to drain well.  He does not note any severe cramping pain consistent with bladder spasms although his episodic issues appear to be consistent.  Hand irrigated at bedside with approximately 200 cc of sterile saline and no appreciable clot could be aspirated.  Return to slow/moderate drip CBI.     Initial HPI:  This is a 73 y.o. male with the past medical history of BPH.  He was admitted back in August 2019 for gross hematuria with acute on chronic blood loss anemia with a hemoglobin of 3.8.  Urine remained bloody and therefore he underwent cystoscopy with evacuation of clots and fulguration of bleeders with TUR biopsy of the bladder neck which was negative.  At that time, the prostatic urethra was noted to be markedly elongated with massive by lobar hyperplasia and a large middle lobe primarily on the left.  Today, the patient presented with 1 week history of gross hematuria with fatigue and weakness.  The patient failed to follow-up with urology since his last hospitalization.  He had been started on finasteride but he has now stopped this since he no longer had any bleeding.  In the emergency department, he was noted to have a hemoglobin of 4.9.  He is currently receiving a blood transfusion.  He denies any current complaints.  Renal ultrasound in the emergency department revealed likely blood clot within the bladder.  He was not in retention.  He had massive enlargement of the prostate.  A CT scan about 5 years ago showed a markedly enlarged prostate that was 11.2 x 8.4 x 11.4 cm.   Objective: Vital signs in last 24 hours: Temp:  [97.6 F (36.4 C)-98.8 F (37.1 C)] 97.6 F (36.4 C) (12/21 0425) Pulse Rate:  [70-92] 89 (12/21  0425) Resp:  [14-16] 16 (12/21 0425) BP: (112-134)/(68-87) 134/87 (12/21 0425) SpO2:  [100 %] 100 % (12/21 0425)  Intake/Output from previous day: 12/20 0701 - 12/21 0700 In: 18734.1 [P.O.:440; I.V.:1379.1; Blood:1815; IV Piggyback:100] Out: [id number] [Urine:18675] Intake/Output this shift: No intake/output data recorded.  Physical Exam:  General: Alert and oriented CV: Regular rate Lungs: No increased work of breathing Abdomen: Soft, non-tender GU: 24 [place] three-way hematuria catheter in place draining light pink clear urine on slow/moderate drip rate CBI.  Hand irrigated at bedside without return of clot. Ext: NT, No erythema  Lab Results: Recent Labs    05/25/19 0207 05/25/19 2103 05/26/19 1202  HGB 4.9* 7.4* 7.5*  HCT 16.1* 22.8* 23.4*   Recent Labs    05/25/19 0207 05/26/19 0721  NA 135 140  K 3.0* 3.9  CL 100 109  CO2 20* 21*  GLUCOSE 148* 127*  BUN 17 16  CREATININE 1.31* 1.23  CALCIUM 8.3* 7.6*    Studies/Results: US RENAL  Result Date: 05/25/2019 CLINICAL DATA:  Hematuria EXAM: RENAL / URINARY TRACT ULTRASOUND COMPLETE COMPARISON:  01/14/2018 FINDINGS: Right Kidney: Renal measurements: 11.8 x 6.9 x 6.2 cm = volume: 266 mL. Upper and lower pole cysts measuring up to 5.4 cm at the lower pole Left Kidney: Renal measurements: 10 x 5 x 5 cm = volume: 140 mL. Despite smaller size no appreciable cortical thinning, some of the asymmetry may be due to angle of imaging. No hydronephrosis. Central cyst  measuring up to 4.7 cm Bladder: Incomplete distension of the bladder with mobile avascular echogenic material that is most consistent with clot. There is mass along the bladder base with bladder up lifting, correlating with history of massive BPH. Grossly stable appearance when compared with 2018 CT. IMPRESSION: 1. Mobile material in the bladder most consistent with blood clot. The bladder is not over distended. 2. Massive enlargement of the prostate. Electronically Signed    By: Monte Fantasia M.D.   On: 05/25/2019 08:02    Assessment/Plan:   73 y.o. male with gross hematuria with acute blood loss anemia likely secondary to severe BPH.  CT scan notes prostate is greater than 500 cc. He was not previously taking his finasteride. This appears to be a somewhat chronic issue over the past several weeks, he reports he has been voiding reasonably well at home.   Recommendations: -Okay for diet.   -No obvious significant active bleeding  -Continue slow drip CBI and titrate to pale pink  -Start oxybutynin, Myrbetriq, B& - Agree with hemoglobin recheck - Continue finasteride   LOS: 0 days

## 2019-05-27 ENCOUNTER — Encounter (HOSPITAL_COMMUNITY): Payer: Self-pay | Admitting: Internal Medicine

## 2019-05-27 ENCOUNTER — Encounter (HOSPITAL_COMMUNITY)
Admission: EM | Disposition: A | Discharge status: Home / Self Care | Payer: Self-pay | Source: Home / Self Care | Attending: Internal Medicine

## 2019-05-27 ENCOUNTER — Inpatient Hospital Stay (HOSPITAL_COMMUNITY): Payer: Medicare Other | Admitting: Certified Registered Nurse Anesthetist

## 2019-05-27 ENCOUNTER — Other Ambulatory Visit: Payer: Self-pay

## 2019-05-27 ENCOUNTER — Other Ambulatory Visit: Payer: Self-pay | Admitting: Urology

## 2019-05-27 ENCOUNTER — Inpatient Hospital Stay (HOSPITAL_COMMUNITY): Payer: Medicare Other

## 2019-05-27 HISTORY — PX: TRANSURETHRAL RESECTION OF PROSTATE: SHX73

## 2019-05-27 LAB — CBC
HCT: 20.6 % — ABNORMAL LOW (ref 39.0–52.0)
HCT: 23.1 % — ABNORMAL LOW (ref 39.0–52.0)
Hemoglobin: 6.9 g/dL — CL (ref 13.0–17.0)
Hemoglobin: 7.4 g/dL — ABNORMAL LOW (ref 13.0–17.0)
MCH: 28.8 pg (ref 26.0–34.0)
MCH: 29.7 pg (ref 26.0–34.0)
MCHC: 33.5 g/dL (ref 30.0–36.0)
MCHC: 32 g/dL (ref 30.0–36.0)
MCV: 85.8 fL (ref 80.0–100.0)
MCV: 92.8 fL (ref 80.0–100.0)
Platelets: 205 10*3/uL (ref 150–400)
Platelets: 181 10*3/uL (ref 150–400)
RBC: 2.4 MIL/uL — ABNORMAL LOW (ref 4.22–5.81)
RBC: 2.49 MIL/uL — ABNORMAL LOW (ref 4.22–5.81)
RDW: 15 % (ref 11.5–15.5)
RDW: 15 % (ref 11.5–15.5)
WBC: 13.2 10*3/uL — ABNORMAL HIGH (ref 4.0–10.5)
WBC: 7.6 10*3/uL (ref 4.0–10.5)
nRBC: 0.5 % — ABNORMAL HIGH (ref 0.0–0.2)
nRBC: 0.7 % — ABNORMAL HIGH (ref 0.0–0.2)

## 2019-05-27 LAB — BASIC METABOLIC PANEL
Anion gap: 12 (ref 5–15)
BUN: 19 mg/dL (ref 8–23)
CO2: 21 mmol/L — ABNORMAL LOW (ref 22–32)
Calcium: 7.7 mg/dL — ABNORMAL LOW (ref 8.9–10.3)
Chloride: 109 mmol/L (ref 98–111)
Creatinine, Ser: 1.55 mg/dL — ABNORMAL HIGH (ref 0.61–1.24)
GFR calc Af Amer: 51 mL/min — ABNORMAL LOW (ref >60)
GFR calc non Af Amer: 44 mL/min — ABNORMAL LOW (ref >60)
Glucose, Bld: 133 mg/dL — ABNORMAL HIGH (ref 70–99)
Potassium: 3.8 mmol/L (ref 3.5–5.1)
Sodium: 142 mmol/L (ref 135–145)

## 2019-05-27 LAB — PSA (REFLEX TO FREE) (SERIAL): Prostate Specific Ag, Serum: 87.6 ng/mL — ABNORMAL HIGH (ref 0.0–4.0)

## 2019-05-27 LAB — HEMOGLOBIN AND HEMATOCRIT, BLOOD
HCT: 18.5 % — ABNORMAL LOW (ref 39.0–52.0)
Hemoglobin: 6.1 g/dL — CL (ref 13.0–17.0)

## 2019-05-27 LAB — PREPARE RBC (CROSSMATCH)

## 2019-05-27 LAB — URINE CULTURE: Culture: 20000 — AB

## 2019-05-27 SURGERY — TURP (TRANSURETHRAL RESECTION OF PROSTATE)
Anesthesia: General

## 2019-05-27 MED ORDER — FENTANYL CITRATE (PF) 100 MCG/2ML IJ SOLN
INTRAMUSCULAR | Status: AC
  Filled 2019-05-27: qty 2

## 2019-05-27 MED ORDER — EPHEDRINE SULFATE-NACL 50-0.9 MG/10ML-% IV SOSY
PREFILLED_SYRINGE | INTRAVENOUS | Status: DC | PRN
  Administered 2019-05-27: 10 mg via INTRAVENOUS
  Administered 2019-05-27: 15 mg via INTRAVENOUS

## 2019-05-27 MED ORDER — SODIUM CHLORIDE 0.9% IV SOLUTION: Freq: Once | INTRAVENOUS | Status: DC

## 2019-05-27 MED ORDER — ALBUMIN HUMAN 5 % IV SOLN: INTRAVENOUS | Status: DC | PRN

## 2019-05-27 MED ORDER — PROPOFOL 10 MG/ML IV BOLUS
INTRAVENOUS | Status: AC
  Filled 2019-05-27: qty 20

## 2019-05-27 MED ORDER — PROPOFOL 10 MG/ML IV BOLUS
INTRAVENOUS | Status: DC | PRN
  Administered 2019-05-27: 200 mg via INTRAVENOUS

## 2019-05-27 MED ORDER — BELLADONNA ALKALOIDS-OPIUM 16.2-30 MG RE SUPP
RECTAL | Status: AC
  Filled 2019-05-27: qty 1

## 2019-05-27 MED ORDER — IOHEXOL 300 MG/ML  SOLN
100.0000 mL | Freq: Once | INTRAMUSCULAR | Status: AC | PRN
  Administered 2019-05-27: 100 mL via INTRAVENOUS

## 2019-05-27 MED ORDER — DEXAMETHASONE SODIUM PHOSPHATE 10 MG/ML IJ SOLN
INTRAMUSCULAR | Status: AC
  Filled 2019-05-27: qty 1

## 2019-05-27 MED ORDER — BELLADONNA ALKALOIDS-OPIUM 16.2-60 MG RE SUPP
RECTAL | Status: DC | PRN
  Administered 2019-05-27: 1 via RECTAL

## 2019-05-27 MED ORDER — PHENYLEPHRINE 40 MCG/ML (10ML) SYRINGE FOR IV PUSH (FOR BLOOD PRESSURE SUPPORT)
PREFILLED_SYRINGE | INTRAVENOUS | Status: AC
  Filled 2019-05-27: qty 10

## 2019-05-27 MED ORDER — STERILE WATER FOR IRRIGATION IR SOLN
Status: DC | PRN
  Administered 2019-05-27: 3000 mL

## 2019-05-27 MED ORDER — LIDOCAINE 2% (20 MG/ML) 5 ML SYRINGE
INTRAMUSCULAR | Status: DC | PRN
  Administered 2019-05-27: 60 mg via INTRAVENOUS

## 2019-05-27 MED ORDER — DEXAMETHASONE SODIUM PHOSPHATE 10 MG/ML IJ SOLN
INTRAMUSCULAR | Status: DC | PRN
  Administered 2019-05-27: 10 mg via INTRAVENOUS

## 2019-05-27 MED ORDER — ONDANSETRON HCL 4 MG/2ML IJ SOLN
INTRAMUSCULAR | Status: DC | PRN
  Administered 2019-05-27: 4 mg via INTRAVENOUS

## 2019-05-27 MED ORDER — OXYCODONE HCL 5 MG/5ML PO SOLN: 5.0000 mg | Freq: Once | ORAL | Status: DC | PRN

## 2019-05-27 MED ORDER — LIDOCAINE 2% (20 MG/ML) 5 ML SYRINGE
INTRAMUSCULAR | Status: AC
  Filled 2019-05-27: qty 5

## 2019-05-27 MED ORDER — SODIUM CHLORIDE 0.9 % IR SOLN
Status: DC | PRN
  Administered 2019-05-27: 6000 mL

## 2019-05-27 MED ORDER — OXYCODONE HCL 5 MG PO TABS: 5.0000 mg | ORAL_TABLET | Freq: Once | ORAL | Status: DC | PRN

## 2019-05-27 MED ORDER — FENTANYL CITRATE (PF) 100 MCG/2ML IJ SOLN
INTRAMUSCULAR | Status: DC | PRN
  Administered 2019-05-27: 50 ug via INTRAVENOUS
  Administered 2019-05-27: 25 ug via INTRAVENOUS
  Administered 2019-05-27: 50 ug via INTRAVENOUS
  Administered 2019-05-27: 25 ug via INTRAVENOUS

## 2019-05-27 MED ORDER — SODIUM CHLORIDE 0.9% IV SOLUTION: Freq: Once | INTRAVENOUS | Status: AC

## 2019-05-27 MED ORDER — ALBUMIN HUMAN 5 % IV SOLN
INTRAVENOUS | Status: AC
  Filled 2019-05-27: qty 250

## 2019-05-27 MED ORDER — PROMETHAZINE HCL 25 MG/ML IJ SOLN: 6.2500 mg | INTRAMUSCULAR | Status: DC | PRN

## 2019-05-27 MED ORDER — HYDROMORPHONE HCL 1 MG/ML IJ SOLN: 0.2500 mg | INTRAMUSCULAR | Status: DC | PRN

## 2019-05-27 MED ORDER — LACTATED RINGERS IV SOLN: INTRAVENOUS | Status: DC | PRN

## 2019-05-27 SURGICAL SUPPLY — 21 items
BAG URINE DRAIN 2000ML AR STRL (UROLOGICAL SUPPLIES) ×3 IMPLANT
BAG URO CATCHER STRL LF (MISCELLANEOUS) ×3 IMPLANT
CATH FOLEY 2WAY SLVR 30CC 24FR (CATHETERS) ×3 IMPLANT
CATH FOLEY 3WAY 30CC 22FR (CATHETERS) IMPLANT
CATH FOLEY 3WAY 30CC 24FR (CATHETERS) ×2
CATH URTH STD 24FR FL 3W 2 (CATHETERS) IMPLANT
ELECT REM PT RETURN 15FT ADLT (MISCELLANEOUS) ×3 IMPLANT
GLOVE BIO SURGEON STRL SZ7.5 (GLOVE) ×3 IMPLANT
GOWN STRL REUS W/TWL XL LVL3 (GOWN DISPOSABLE) ×3 IMPLANT
GUIDEWIRE STR DUAL SENSOR (WIRE) ×2 IMPLANT
HOLDER FOLEY CATH W/STRAP (MISCELLANEOUS) IMPLANT
KIT TURNOVER KIT A (KITS) IMPLANT
LOOP CUT BIPOLAR 24F LRG (ELECTROSURGICAL) ×2 IMPLANT
MANIFOLD NEPTUNE II (INSTRUMENTS) ×3 IMPLANT
PACK CYSTO (CUSTOM PROCEDURE TRAY) ×3 IMPLANT
PENCIL SMOKE EVACUATOR (MISCELLANEOUS) IMPLANT
SYR 30ML LL (SYRINGE) IMPLANT
SYRINGE IRR TOOMEY STRL 70CC (SYRINGE) ×3 IMPLANT
TUBING CONNECTING 10 (TUBING) ×2 IMPLANT
TUBING CONNECTING 10' (TUBING) ×1
TUBING UROLOGY SET (TUBING) ×3 IMPLANT

## 2019-05-27 NOTE — Consult Note (Signed)
The patient was taken to the operating room and blood clot was evacuated.  Between 750 cc and 1000 cc of clot was evacuated.  There was bleeding at the median lobe of the prostate.  That was fulgurated.  All the bleeding was cauterized and hemostatic at the end of the case.  A 24 French three-way Foley catheter was placed and the patient was placed on continuous bladder irrigation.  The patient is being transferred back to [facility], under the care of the hospitalist service.

## 2019-05-27 NOTE — Progress Notes (Signed)
CT shows large volume of clot in the bladder with a massive prostate.  He will need transfer to [facility] for cystoscopy and clot evacuation by Dr. Louis Meckel later today.  I have discussed the procedure and risks with [name] [name] and he is agreeable to proceed today.     He will need to be considered for additional blood transfusion with his persistent anemia.

## 2019-05-27 NOTE — H&P (View-Only) (Signed)
Urology Consult Progress Note   Subjective: Foley removed yesterday. Severe bladder spasms. Had another 1u pRBC Hgb 6.9 Condom catheter with dark UOP and clotted off, although unclear if voiding large clots.  Discussed CT Urogram, he agrees.   Initial HPI:  This is a 73 y.o. male with the past medical history of BPH.  He was admitted back in August 2019 for gross hematuria with acute on chronic blood loss anemia with a hemoglobin of 3.8.  Urine remained bloody and therefore he underwent cystoscopy with evacuation of clots and fulguration of bleeders with TUR biopsy of the bladder neck which was negative.  At that time, the prostatic urethra was noted to be markedly elongated with massive by lobar hyperplasia and a large middle lobe primarily on the left.  Today, the patient presented with 1 week history of gross hematuria with fatigue and weakness.  The patient failed to follow-up with urology since his last hospitalization.  He had been started on finasteride but he has now stopped this since he no longer had any bleeding.  In the emergency department, he was noted to have a hemoglobin of 4.9.  He is currently receiving a blood transfusion.  He denies any current complaints.  Renal ultrasound in the emergency department revealed likely blood clot within the bladder.  He was not in retention.  He had massive enlargement of the prostate.  A CT scan about 5 years ago showed a markedly enlarged prostate that was 11.2 x 8.4 x 11.4 cm.   Objective: Vital signs in last 24 hours: Temp:  [97.7 F (36.5 C)-99.1 F (37.3 C)] 98.5 F (36.9 C) (12/22 0630) Pulse Rate:  [60-92] 73 (12/22 0630) Resp:  [17-18] 17 (12/22 0630) BP: (106-145)/(60-105) 106/67 (12/22 0630) SpO2:  [99 %-100 %] 99 % (12/22 0630)  Intake/Output from previous day: 12/21 0701 - 12/22 0700 In: 619.9 [I.V.:204.9; Blood:315; IV Piggyback:100] Out: 800 [Urine:800] Intake/Output this shift: No intake/output data  recorded.  Physical Exam:  General: Alert and oriented CV: Regular rate Lungs: No increased work of breathing Abdomen: Soft, non-tender GU: Condom catheter with burgundy colored thin urine  Ext: NT, No erythema  Lab Results: Recent Labs    05/26/19 1202 05/26/19 1855 05/27/19 0235  HGB 7.5* 6.5* 6.9*  HCT 23.4* 19.9* 20.6*   Recent Labs    05/26/19 0721 05/27/19 0235  NA 140 142  K 3.9 3.8  CL 109 109  CO2 21* 21*  GLUCOSE 127* 133*  BUN 16 19  CREATININE 1.23 1.55*  CALCIUM 7.6* 7.7*    Studies/Results: No results found.  Assessment/Plan:   73 y.o. male with gross hematuria with acute blood loss anemia likely secondary to severe BPH. Had 24Fr foley in on CBI but given bladder spasms, patient demanded removal; has been voiding spontaneously since.   Has required 4u pRBC with Hgb still <7.  CT scan notes prostate is greater than 500 cc. He was not previously taking his finasteride. This appears to be a somewhat chronic issue over the past several weeks, he reports he has been voiding reasonably well at home.   Recommendations: -NPO at this time, may need cystoscopy with clot evac and fulguration of bleeding vessels - Agree with hemoglobin recheck - Continue finasteride - CT Urogram this morning, eval kidney/upper tracts, eval lymphadenopathy given prior elevated PSA.     LOS: 1 day

## 2019-05-27 NOTE — Transfer of Care (Signed)
Immediate Anesthesia Transfer of Care Note  Patient: [name] [name].  Procedure(s) Performed: Cystoscopy Clot Evacuation and Fulgration (N/A )  Patient Location: PACU  Anesthesia Type:General  Level of Consciousness: awake, alert  and patient cooperative  Airway & Oxygen Therapy: Patient Spontanous Breathing and Patient connected to face mask oxygen  Post-op Assessment: Report given to RN and Post -op Vital signs reviewed and stable  Post vital signs: Reviewed and stable  Last Vitals:  Vitals Value Taken Time  BP 129/77 05/27/19 1934  Temp    Pulse 58 05/27/19 1942  Resp 12 05/27/19 1941  SpO2 100 % 05/27/19 1942  Vitals shown include unvalidated device data.  Last Pain:  Vitals:   05/27/19 1715  TempSrc: Oral  PainSc:          Complications: No apparent anesthesia complications

## 2019-05-27 NOTE — Plan of Care (Signed)

## 2019-05-27 NOTE — Anesthesia Preprocedure Evaluation (Signed)
Anesthesia Evaluation  Patient identified by MRN, date of birth, ID band Patient awake    Reviewed: Allergy & Precautions, NPO status , Patient's Chart, lab work & pertinent test results  Airway Mallampati: I       Dental no notable dental hx. (+) Teeth Intact   Pulmonary    Pulmonary exam normal breath sounds clear to auscultation       Cardiovascular hypertension, Pt. on medications Normal cardiovascular exam Rhythm:Regular Rate:Normal     Neuro/Psych    GI/Hepatic negative GI ROS, Neg liver ROS,   Endo/Other  negative endocrine ROS  Renal/GU negative Renal ROS Bladder dysfunction      Musculoskeletal negative musculoskeletal ROS (+)   Abdominal Normal abdominal exam  (+)   Peds  Hematology  (+) anemia ,   Anesthesia Other Findings   Reproductive/Obstetrics                             Anesthesia Physical  Anesthesia Plan  ASA: III and emergent  Anesthesia Plan: General   Post-op Pain Management:    Induction: Intravenous  PONV Risk Score and Plan: 2 and Ondansetron, Midazolam and Treatment may vary due to age or medical condition  Airway Management Planned: LMA  Additional Equipment:   Intra-op Plan:   Post-operative Plan: Extubation in OR  Informed Consent: I have reviewed the patients History and Physical, chart, labs and discussed the procedure including the risks, benefits and alternatives for the proposed anesthesia with the patient or authorized representative who has indicated his/her understanding and acceptance.     Dental advisory given  Plan Discussed with: CRNA and Surgeon  Anesthesia Plan Comments:         Anesthesia Quick Evaluation

## 2019-05-27 NOTE — Progress Notes (Signed)
Urology Consult Progress Note   Subjective: Foley removed yesterday. Severe bladder spasms. Had another 1u pRBC Hgb 6.9 Condom catheter with dark UOP and clotted off, although unclear if voiding large clots.  Discussed CT Urogram, he agrees.   Initial HPI:  This is a 73 y.o. male with the past medical history of BPH.  He was admitted back in August 2019 for gross hematuria with acute on chronic blood loss anemia with a hemoglobin of 3.8.  Urine remained bloody and therefore he underwent cystoscopy with evacuation of clots and fulguration of bleeders with TUR biopsy of the bladder neck which was negative.  At that time, the prostatic urethra was noted to be markedly elongated with massive by lobar hyperplasia and a large middle lobe primarily on the left.  Today, the patient presented with 1 week history of gross hematuria with fatigue and weakness.  The patient failed to follow-up with urology since his last hospitalization.  He had been started on finasteride but he has now stopped this since he no longer had any bleeding.  In the emergency department, he was noted to have a hemoglobin of 4.9.  He is currently receiving a blood transfusion.  He denies any current complaints.  Renal ultrasound in the emergency department revealed likely blood clot within the bladder.  He was not in retention.  He had massive enlargement of the prostate.  A CT scan about 5 years ago showed a markedly enlarged prostate that was 11.2 x 8.4 x 11.4 cm.   Objective: Vital signs in last 24 hours: Temp:  [97.7 F (36.5 C)-99.1 F (37.3 C)] 98.5 F (36.9 C) (12/22 0630) Pulse Rate:  [60-92] 73 (12/22 0630) Resp:  [17-18] 17 (12/22 0630) BP: (106-145)/(60-105) 106/67 (12/22 0630) SpO2:  [99 %-100 %] 99 % (12/22 0630)  Intake/Output from previous day: 12/21 0701 - 12/22 0700 In: 619.9 [I.V.:204.9; Blood:315; IV Piggyback:100] Out: 800 [Urine:800] Intake/Output this shift: No intake/output data  recorded.  Physical Exam:  General: Alert and oriented CV: Regular rate Lungs: No increased work of breathing Abdomen: Soft, non-tender GU: Condom catheter with burgundy colored thin urine  Ext: NT, No erythema  Lab Results: Recent Labs    05/26/19 1202 05/26/19 1855 05/27/19 0235  HGB 7.5* 6.5* 6.9*  HCT 23.4* 19.9* 20.6*   Recent Labs    05/26/19 0721 05/27/19 0235  NA 140 142  K 3.9 3.8  CL 109 109  CO2 21* 21*  GLUCOSE 127* 133*  BUN 16 19  CREATININE 1.23 1.55*  CALCIUM 7.6* 7.7*    Studies/Results: No results found.  Assessment/Plan:   73 y.o. male with gross hematuria with acute blood loss anemia likely secondary to severe BPH. Had 24Fr foley in on CBI but given bladder spasms, patient demanded removal; has been voiding spontaneously since.   Has required 4u pRBC with Hgb still <7.  CT scan notes prostate is greater than 500 cc. He was not previously taking his finasteride. This appears to be a somewhat chronic issue over the past several weeks, he reports he has been voiding reasonably well at home.   Recommendations: -NPO at this time, may need cystoscopy with clot evac and fulguration of bleeding vessels - Agree with hemoglobin recheck - Continue finasteride - CT Urogram this morning, eval kidney/upper tracts, eval lymphadenopathy given prior elevated PSA.     LOS: 1 day

## 2019-05-27 NOTE — Anesthesia Procedure Notes (Signed)
Procedure Name: LMA Insertion Date/Time: 05/27/2019 6:31 PM Performed by: West Pugh, CRNA Pre-anesthesia Checklist: Patient identified, Emergency Drugs available, Suction available, Patient being monitored and Timeout performed Patient Re-evaluated:Patient Re-evaluated prior to induction Oxygen Delivery Method: Circle system utilized Preoxygenation: Pre-oxygenation with 100% oxygen Induction Type: IV induction LMA: LMA inserted LMA Size: 4.0 Placement Confirmation: positive ETCO2 and breath sounds checked- equal and bilateral Tube secured with: Tape Dental Injury: Teeth and Oropharynx as per pre-operative assessment

## 2019-05-27 NOTE — Interval H&P Note (Signed)
History and Physical Interval Note:  05/27/2019 6:08 PM  [name] Ast.  has presented today for surgery, with the diagnosis of Clot Retension BPH.  The various methods of treatment have been discussed with the patient and family. After consideration of risks, benefits and other options for treatment, the patient has consented to  Procedure(s): Cystoscopy Clot Evacuation and Fulgration (N/A) as a surgical intervention.  The patient's history has been reviewed, patient examined, no change in status, stable for surgery.  I have reviewed the patient's chart and labs.  Questions were answered to the patient's satisfaction.     Ardis Hughs

## 2019-05-27 NOTE — Progress Notes (Signed)
PROGRESS NOTE    [name] [name].  [id number] DOB: 07-02-1945 DOA: 05/25/2019 PCP: [name], NP    Brief Narrative: 73 year old gentleman prior history of BPH, elevated PSA hypertension, glaucoma, legally blind presents with hematuria associated with lightheadedness.  On arrival to ED his hemoglobin was found to be around 4.  Previously he underwent cystoscopy was found to have a 1.5 papillary lesion of the left anterior bladder neck s/p TUR biopsy which was negative for malignancy.  This time he was admitted for gross hematuria, urology consulted and he underwent urethral dilatation with Foley catheter insertion and bladder irrigation.  On the morning of 12/21 patient had severe pain at the site of the foley catheter placement, and the catheter was not draining despite flushes . Patient became frustrated,  wanted the Foley catheter to be removed and it was removed as per his request.  Patient underwent a repeat CT abd and pelvis this morning and was found to have Large filling defect within the lumen of the bladder superior to the massive prostate suggesting hematoma. Repeat hemoglobin last night was less than 7, when he received another 1 unit of prbc and repeat H&H this morning was less than 7, and another 2 units or pbrc ordered. He is currently getting 1 unit at this time and hopefully he will get the 2 nd unit prior to the cystoscopy. He has received a total of 5 units of prbc transfusion so far.  Urology requested to transfer the patient to [facility] for cystoscopy, and evacuation of blood clots in the bladder.  He remains pain free at this time and in good spirits , agreeable to transfer to [facility] .     Assessment & Plan:   Principal Problem:   Hematuria Active Problems:   Anemia due to blood loss, acute   Hypokalemia   AKI (acute kidney injury) ([place])   BPH (benign prostatic hyperplasia)   Gross hematuria probably from massive prostate enlargement Urology consulted and he  underwent urethral dilatation with Foley catheter insertion and bladder regular irrigation.  Unfortunately th mornign or 12/21, patient had severe pain near the foley catheter site and catheter was not draining despite flushes and patient requested the Foley catheter to be removed and it was removed as per his request.  On-call urologist was notified who recommended to keep the patient n.p.o. Repeat CT of the abdomen and pelvis done this morning showed large hematoma in the bladder with a massive prostate.  Urology recommended transfer to [facility] for for cystoscopy and clot evacuation later today.  Patient is agreeable.  Recommend to continue gentle hydration Continue with oxybutynin, Myrbetriq and finasteride as per urology. Pain control and IV Rocephin.    Acute anemia of blood loss secondary to hematuria S/p 3 units of PRBC transfusions on admission and his repeat hemoglobin on 05/26/2019 was 6.7.  A unit of blood was given and a repeat hemoglobin this morning still less than 7 and other 2 units were ordered the first 1 is running at this time.  Transfuse to keep hemoglobin greater than 7. iron is 38 and ferritin is 11.  A total of 6 units were ordered and he has received 5 units of PRBC. Recommend iron supplementation once able to take oral.    Urinary tract infection urinalysis with positive nitrates and trace leukocytes Urine cultures show 20,000 submitted in Streptococcus.  Recommend to continue with Rocephin as he is getting a cystoscopy later today.    Acute kidney injury  creatinine 1.3 on admission with baseline of around 0.8-1. Creatinine up to 1.5 earlier this morning probably from obstructive uropathy from BPH.   BPH Continue with finasteride.    Hypokalemia Replaced    DVT prophylaxis: SCDs Code Status: Full code Family Communication: None at bedside Disposition Plan: Pending clinical improvement and further evaluation and improvement of the hematuria    Consultants:   Urology  Procedures: None Antimicrobials: rocephin since admission.   Subjective: Pt is in good spirits. No chest pain or sob. No pain. Still leaking urine.   Objective: Vitals:   05/26/19 2215 05/27/19 0012 05/27/19 0630 05/27/19 1311  BP: 131/89 120/60 106/67 115/71  Pulse: 78 60 73 75  Resp: 18 18 17 16   Temp: 98.4 F (36.9 C) 99.1 F (37.3 C) 98.5 F (36.9 C) 98.7 F (37.1 C)  TempSrc: Oral Oral Oral Oral  SpO2: 99% 100% 99% 100%  Weight:      Height:        Intake/Output Summary (Last 24 hours) at 05/27/2019 1336 Last data filed at 05/27/2019 0900 Gross per 24 hour  Intake 619.85 ml  Output 800 ml  Net -180.15 ml   Filed Weights   05/25/19 0139  Weight: 90.7 kg    Examination:  General exam: calm and comfortable.  Respiratory system:  Clear to auscultation, no wheezing or rhonchi.  Cardiovascular system:  S1S2, RRR Gastrointestinal system: Abdomen is soft tender in in the lower abd area. Bowel sounds heard.  Central nervous system: alert and oriented.  Extremities: no pedal edema.  Skin: no rashes.  Psychiatry: mood is appropriate.     Data Reviewed: I have personally reviewed following labs and imaging studies  CBC: Recent Labs  Lab 05/25/19 0207 05/25/19 2103 05/26/19 1202 05/26/19 1855 05/27/19 0235 05/27/19 0954  WBC 11.5* 12.0*  --   --  13.2*  --   HGB 4.9* 7.4* 7.5* 6.5* 6.9* 6.1*  HCT 16.1* 22.8* 23.4* 19.9* 20.6* 18.5*  MCV 86.6 86.0  --   --  85.8  --   PLT 370 258  --   --  205  --    Basic Metabolic Panel: Recent Labs  Lab 05/25/19 0207 05/25/19 0655 05/26/19 0721 05/27/19 0235  NA 135  --  140 142  K 3.0*  --  3.9 3.8  CL 100  --  109 109  CO2 20*  --  21* 21*  GLUCOSE 148*  --  127* 133*  BUN 17  --  16 19  CREATININE 1.31*  --  1.23 1.55*  CALCIUM 8.3*  --  7.6* 7.7*  MG  --  2.1  --   --    GFR: Estimated Creatinine Clearance: 46.4 mL/min (A) (by C-G formula based on SCr of 1.55 mg/dL (H)).  Liver Function Tests: Recent Labs  Lab 05/25/19 0207  [name] 24  ALT 20  ALKPHOS 49  BILITOT 1.0  PROT 6.2*  ALBUMIN 3.1*   No results for input(s): LIPASE, AMYLASE in the last 168 hours. No results for input(s): AMMONIA in the last 168 hours. Coagulation Profile: Recent Labs  Lab 05/25/19 0450  INR 1.1   Cardiac Enzymes: No results for input(s): CKTOTAL, CKMB, CKMBINDEX, TROPONINI in the last 168 hours. BNP (last 3 results) No results for input(s): PROBNP in the last 8760 hours. HbA1C: No results for input(s): HGBA1C in the last 72 hours. CBG: No results for input(s): GLUCAP in the last 168 hours. Lipid Profile: No results for input(s): CHOL,  HDL, LDLCALC, TRIG, CHOLHDL, LDLDIRECT in the last 72 hours. Thyroid Function Tests: No results for input(s): TSH, T4TOTAL, FREET4, T3FREE, THYROIDAB in the last 72 hours. Anemia Panel: Recent Labs    05/25/19 1816  FERRITIN 11*  TIBC 371  IRON 38*   Sepsis Labs: No results for input(s): PROCALCITON, LATICACIDVEN in the last 168 hours.  Recent Results (from the past 240 hour(s))  SARS CORONAVIRUS 2 (TAT 6-24 HRS) Nasopharyngeal Nasopharyngeal Swab     Status: None   Collection Time: 05/25/19  4:20 AM   Specimen: Nasopharyngeal Swab  Result Value Ref Range Status   SARS Coronavirus 2 NEGATIVE NEGATIVE Final    Comment: (NOTE) SARS-CoV-2 target nucleic acids are NOT DETECTED. The SARS-CoV-2 RNA is generally detectable in upper and lower respiratory specimens during the acute phase of infection. Negative results do not preclude SARS-CoV-2 infection, do not rule out co-infections with other pathogens, and should not be used as the sole basis for treatment or other patient management decisions. Negative results must be combined with clinical observations, patient history, and epidemiological information. The expected result is Negative. Fact Sheet for Patients: SugarRoll.be Fact Sheet for  Healthcare Providers: https://www.woods-mathews.com/ This test is not yet approved or cleared by the [place] FDA and  has been authorized for detection and/or diagnosis of SARS-CoV-2 by FDA under an Emergency Use Authorization (EUA). This EUA will remain  in effect (meaning this test can be used) for the duration of the COVID-19 declaration under Section 56 4(b)(1) of the Act, 21 U.S.C. section 360bbb-3(b)(1), unless the authorization is terminated or revoked sooner. Performed at [facility], [place] [address] [place], [place] [address]   Urine culture     Status: Abnormal   Collection Time: 05/25/19  4:36 AM   Specimen: Urine, Clean Catch  Result Value Ref Range Status   Specimen Description URINE, CLEAN CATCH  Final   Special Requests   Final    NONE Performed at [facility], [place] [address] [place], [place] [address]    Culture 20,000 COLONIES/mL VIRIDANS STREPTOCOCCUS (A)  Final   Report Status 05/27/2019 FINAL  Final         Radiology Studies: CT ABDOMEN PELVIS W CONTRAST  Result Date: 05/27/2019 CLINICAL DATA:  .Hematuria, unknown cause Gross hematuira; massive prostate. NEEDS delayed images to eval kideny/upper tracts. Eval lymphadenopathy EXAM: CT ABDOMEN AND PELVIS WITH CONTRAST TECHNIQUE: Multidetector CT imaging of the abdomen and pelvis was performed using the standard protocol following bolus administration of intravenous contrast. CONTRAST:  174mL OMNIPAQUE IOHEXOL 300 MG/ML  SOLN COMPARISON:  CT 12/23/2012 FINDINGS: Lower chest: Lung bases are clear. Hepatobiliary: No focal hepatic lesion. No biliary duct dilatation. Gallbladder is normal. Common bile duct is normal. Pancreas: Pancreas is normal. No ductal dilatation. No pancreatic inflammation. Spleen: Normal spleen Adrenals/urinary tract: Adrenal glands normal. There bilateral nonenhancing renal cysts. No enhancing renal cortical lesion identified. No filling defects within collecting  systems ureters. No ureteral calculi or obstruction. Prostate is massively enlarged measuring 11.9 cm in craniocaudad dimension (image 64/10/sagittal). This compares to 11.3 cm on CT from 2014 consistent with interval minimal enlargement over this long time period. Superior to the bladder there is high-density material measuring 3 cm in thickness (image 64/10). No evidence enhancement from the pre to postcontrast imaging. On the delayed imaging excreted contrast surrounds this mass creating a large filling defect. This filling defect occupies the near entirety of the elevated bladder lumen measuring 7.9 x 3.9 cm. There  is gas within the bladder. No filling defects collecting systems ureters described above Stomach/Bowel: Stomach, small-bowel cecum normal. Post appendectomy. Colon rectosigmoid normal. Vascular/Lymphatic: Abdominal aorta is normal caliber. No periportal or retroperitoneal adenopathy. No pelvic adenopathy. Reproductive: Prostate massively enlarged as described in the bladder section. Prostate volume equals 11.8 by 11.3 x 8.6 cm (volume = 600 cm^3) Fat filled LEFT inguinal hernia. The small bilateral hydroceles noted. Other: No free fluid. Musculoskeletal: No aggressive osseous lesion. IMPRESSION: 1. Prostate gland is massively enlarged (600 cubic cm) but similar to comparison CT 2014. 2. Large filling defect within the lumen of the bladder superior to the massive prostate suggesting hematoma. Bladder mass less favored. 3. Gas within bladder may relate to instrumentation. 4. No filling defect within the renal collecting systems or ureters. 5. No enhancing renal cortical lesion. Bilateral Bosniak I renal cysts. 6. Bilateral small hydroceles. 7. No lymphadenopathy. Electronically Signed   By: Suzy Bouchard M.D.   On: 05/27/2019 12:01        Scheduled Meds: . sodium chloride   Intravenous Once  . vitamin C  1,000 mg Oral q morning - 10a  . brimonidine  1 drop Both Eyes BID  . Chlorhexidine  Gluconate Cloth  6 each Topical Daily  . dorzolamide-timolol  1 drop Both Eyes BID  . finasteride  5 mg Oral Daily  . folic acid  1 mg Oral Daily  . iron polysaccharides  150 mg Oral Daily  . mirabegron ER  50 mg Oral Daily  . oxybutynin  5 mg Oral TID  . pilocarpine  1 drop Right Eye TID AC & HS  . pyridOXINE  100 mg Oral q morning - 10a  . vitamin B-12  1,000 mcg Oral q morning - 10a   Continuous Infusions: . sodium chloride 75 mL/hr at 05/27/19 0424  . cefTRIAXone (ROCEPHIN)  IV 1 g (05/27/19 [id number])  . sodium chloride irrigation       LOS: 1 day       Hosie Poisson, MD [facility] 05/27/2019, 1:36 PM

## 2019-05-27 NOTE — Progress Notes (Signed)
Patient transported off unit to CT.

## 2019-05-27 NOTE — Progress Notes (Signed)
Patient drainage bag change unable to empty due to bloody urine with clots 800 ml , Arthor Captain LPN

## 2019-05-27 NOTE — Progress Notes (Signed)
Patient bladder scanned for PVR of 97 ml. Urine remains dark, bloody. Patient resting comfortably in bed. Will continue to monitor closely.

## 2019-05-27 NOTE — Anesthesia Postprocedure Evaluation (Signed)
Anesthesia Post Note  Patient: [name] [name].  Procedure(s) Performed: Cystoscopy Clot Evacuation and Fulgration (N/A )     Patient location during evaluation: PACU Anesthesia Type: General Level of consciousness: awake and alert Pain management: pain level controlled Vital Signs Assessment: post-procedure vital signs reviewed and stable Respiratory status: spontaneous breathing, nonlabored ventilation and respiratory function stable Cardiovascular status: blood pressure returned to baseline and stable Postop Assessment: no apparent nausea or vomiting Anesthetic complications: no    Last Vitals:  Vitals:   05/27/19 2015 05/27/19 2036  BP: 134/78 133/74  Pulse: 67 (!) 59  Resp: 20 18  Temp: 36.8 C (!) 36.2 C  SpO2: 100% 100%    Last Pain:  Vitals:   05/27/19 2036  TempSrc: Oral  PainSc:                  Lynda Rainwater

## 2019-05-27 NOTE — Op Note (Signed)
Preoperative diagnosis:  1. Gross hematuria   Postoperative diagnosis:  1. Gross hematuria  Procedure: 1. Cystoscopy, clot evacuation with fulguration  Surgeon: Ardis Hughs, MD  Anesthesia: General  Complications: None  Intraoperative findings: [name]'s prostate was friable with a very large and edematous and bleeding median lobe.  Approximately 750 cc of clot was evacuated from [name] [name]'s bladder.  EBL: 750cc  Specimens: None  Indication: [name] [name]. is a 73 y.o. [name] with a very large prostate and hematuria.  A CT scan was performed earlier in [name] day demonstrating a large volume of clot within his [name]'s bladder.  He had ongoing bleeding requiring transfusion.  After reviewing [name] management options for treatment, he elected to proceed with [name] above surgical procedure(s). We have discussed [name] potential benefits and risks of [name] procedure, side effects of [name] proposed treatment, [name] likelihood of [name] [name] achieving [name] goals of [name] procedure, and any potential problems that might occur during [name] procedure or recuperation. Informed consent has been obtained.  Description of procedure:  [name] [name] was taken to [name] operating room and general anesthesia was induced.  [name] [name] was placed in [name] dorsal lithotomy position, prepped and draped in [name] usual sterile fashion, and preoperative antibiotics were administered. A preoperative time-out was performed.   Using [name] extra long cystoscope with a 30 degree lens we slowly advanced it to [name] [name]'s urethra and into [name] bladder with [name] above findings.  We then evacuated all [name] blood clot from [name] [name]'s bladder using a [place] syringe.  We then explored [name] base of [name] [name]'s bladder and [name] prostatic urethra noting some bleeding coming from [name] large obstructing median lobe.  This was fulgurated using [name] monopolar loop.  Once this was completed and some of [name] other smaller vessels in [name] area were  cauterized there was no significant ongoing bleeding.  We advanced a sensor wire through [name] scope and into [name] [name]'s bladder.  We then passed a 24 [place] three-way Foley catheter over [name] wire and into [name] [name]'s bladder.  We then irrigated [name] bladder to ensure that [name] catheter was in [name] appropriate place and started [name] [name] on continuous bladder irrigation.  BNO suppositories placed in [name] [name]'s rectum.  Exam under anesthesia demonstrated a irregular prostate with a large nodule on [name] [name]'s right lateral lobe.  Ardis Hughs, M.D.

## 2019-05-27 NOTE — Progress Notes (Signed)
Patient transferred to [facility] for procedure.  Patient report phoned to RN. Patient has condom catheter in place, with bloody urine output, MD aware.  Patient belongings transported with patient via [place].  Patient  transported off unit by [place] at 1503.

## 2019-05-28 ENCOUNTER — Telehealth: Payer: Self-pay | Admitting: *Deleted

## 2019-05-28 LAB — CBC
HCT: 27 % — ABNORMAL LOW (ref 39.0–52.0)
Hemoglobin: 8.7 g/dL — ABNORMAL LOW (ref 13.0–17.0)
MCH: 28.4 pg (ref 26.0–34.0)
MCHC: 32.2 g/dL (ref 30.0–36.0)
MCV: 88.2 fL (ref 80.0–100.0)
Platelets: 199 10*3/uL (ref 150–400)
RBC: 3.06 MIL/uL — ABNORMAL LOW (ref 4.22–5.81)
RDW: 15.1 % (ref 11.5–15.5)
WBC: 10 10*3/uL (ref 4.0–10.5)
nRBC: 0.6 % — ABNORMAL HIGH (ref 0.0–0.2)

## 2019-05-28 LAB — BASIC METABOLIC PANEL
Anion gap: 8 (ref 5–15)
BUN: 20 mg/dL (ref 8–23)
CO2: 21 mmol/L — ABNORMAL LOW (ref 22–32)
Calcium: 7.8 mg/dL — ABNORMAL LOW (ref 8.9–10.3)
Chloride: 110 mmol/L (ref 98–111)
Creatinine, Ser: 1.04 mg/dL (ref 0.61–1.24)
GFR calc Af Amer: >60 mL/min (ref >60)
GFR calc non Af Amer: >60 mL/min (ref >60)
Glucose, Bld: 134 mg/dL — ABNORMAL HIGH (ref 70–99)
Potassium: 4 mmol/L (ref 3.5–5.1)
Sodium: 139 mmol/L (ref 135–145)

## 2019-05-28 LAB — PREPARE RBC (CROSSMATCH)

## 2019-05-28 MED ORDER — FINASTERIDE 5 MG PO TABS: 5.0000 mg | ORAL_TABLET | Freq: Every day | ORAL | 2 refills | Status: DC

## 2019-05-28 MED ORDER — SODIUM CHLORIDE 0.9% IV SOLUTION: Freq: Once | INTRAVENOUS | Status: AC

## 2019-05-28 NOTE — Progress Notes (Signed)
Patient stating he will not take his antibiotic, let RN take out foley or ambulate until he can talk to Dr. Jeffie Pollock.  RN attempted to explain the importance of the medications and interventions; pt continuing to refuse.  Dr. Roni Bread made aware.  Foley left in at this time, CBI stopped.  Will continue to monitor closely.

## 2019-05-28 NOTE — Telephone Encounter (Signed)
Transition Care Management Follow-up Telephone Call  Date of discharge and from where: 05/28/2019 [place]  How have you been since you were released from the hospital? "Wonderful, they took good care of me"  Any questions or concerns? No   Items Reviewed:  Did the pt receive and understand the discharge instructions provided? Yes   Medications obtained and verified? Yes   Any new allergies since your discharge? No   Dietary orders reviewed? Yes  Do you have support at home? Yes   Other (ie: DME, Home Health, etc) No  Functional Questionnaire: (I = Independent and D = Dependent) ADL's: I  Bathing/Dressing- I   Meal Prep- I  Eating- I  Maintaining continence- I  Transferring/Ambulation- I  Managing Meds- I   Follow up appointments reviewed:    PCP Hospital f/u appt confirmed? Yes  Scheduled to see Janett Billow on 12/28 .  [facility] f/u appt confirmed? NO  Are transportation arrangements needed? No   If their condition worsens, is the pt aware to call  their PCP or go to the ED? Yes  Was the patient provided with contact information for the PCP's office or ED? Yes  Was the pt encouraged to call back with questions or concerns? Yes

## 2019-05-28 NOTE — Discharge Summary (Signed)
Physician Discharge Summary  Aldean Ast. [id number] DOB: [birth year] DOA: 05/25/2019  PCP: [name], NP  Admit date: 05/25/2019 Discharge date: 05/28/2019  Admitted From: Home Disposition: Home  Recommendations for Outpatient Follow-up:  1. Follow up with PCP in 1-2 weeks 2. Please obtain BMP/CBC in one week 3. Please follow up with [facility] in 2 weeks  Home Health: None Equipment/Devices none Discharge Condition stable and improved  CODE STATUS: Full code Diet recommendation cardiac diet Brief/Interim Summary:73 year old gentleman prior history of BPH, elevated PSA hypertension, glaucoma, legally blind presents with hematuria associated with lightheadedness.  On arrival to ED his hemoglobin was found to be around 4.  Previously he underwent cystoscopy was found to have a 1.5 papillary lesion of the left anterior bladder neck s/p TUR biopsy which was negative for malignancy.  This time he was admitted for gross hematuria, urology consulted and he underwent urethral dilatation with Foley catheter insertion and bladder irrigation.  On the morning of 12/21 patient had severe pain at the site of the foley catheter placement, and the catheter was not draining despite flushes . Patient became frustrated,  wanted the Foley catheter to be removed and it was removed as per his request.  Patient underwent a repeat CT abd and pelvis this morning and was found to have Large filling defect within the lumen of the bladder superior to the massive prostate suggesting hematoma. Repeat hemoglobin last night was less than 7, when he received another 1 unit of prbc and repeat H&H this morning was less than 7, and another 2 units or pbrc ordered. He is currently getting 1 unit at this time and hopefully he will get the 2 nd unit prior to the cystoscopy. He has received a total of 5 units of prbc transfusion so far.  Urology requested to transfer the patient to [facility] for cystoscopy, and  evacuation of blood clots in the bladder.   Discharge Diagnoses:  Principal Problem:   Hematuria Active Problems:   Anemia due to blood loss, acute   Hypokalemia   AKI (acute kidney injury) ([place])   BPH (benign prostatic hyperplasia)  Gross hematuria probably from massive prostate enlargement Urology consulted and he underwent urethral dilatation with Foley catheter insertion and bladder  irrigation.  Unfortunately th mornign or 12/21, patient had severe pain near the foley catheter site and catheter was not draining despite flushes and patient requested the Foley catheter to be removed and it was removed as per his request.  On-call urologist was notified who recommended to keep the patient n.p.o. Repeat CT of the abdomen and pelvis done this morning showed large hematoma in the bladder with a massive prostate.  Urology recommended transfer to [facility] for for cystoscopy and clot evacuation. He underwent clot evacuation 05/27/2019 His urine cleared up and his hemoglobin was 8.7 on discharge. He will be discharged with Foley catheter in place he will follow-up with urology in 2 weeks.  Acute anemia of blood loss secondary to hematuria-he received a total of 6 unit of packed RBC during this hospital stay.  His hemoglobin on discharge is 8.7. iron is 38 and ferritin is 11.  Recommend iron supplementation   Urinary tract infection urinalysis with positive nitrates and trace leukocytes Urine cultures show 20,000 submitted in Streptococcus.  Patient received Rocephin during the hospital stay.  He will not be discharged on any antibiotics.   Acute kidney injury  creatinine 1.3 on admission with baseline of around 0.8-1.  Creatinine 1.04 on  discharge.  BPH Continue with finasteride.  Hypokalemia potassium 4.0 Replaced   Estimated body mass index is 30.17 kg/m as calculated from the following:   Height as of this encounter: 5\' 8"  (1.727 m).   Weight as of this encounter: 90  kg.  Discharge Instructions  Discharge Instructions    Call MD for:  difficulty breathing, headache or visual disturbances   Complete by: As directed    Call MD for:  persistant nausea and vomiting   Complete by: As directed    Call MD for:  severe uncontrolled pain   Complete by: As directed    Call MD for:  temperature >100.4   Complete by: As directed    Diet - low sodium heart healthy   Complete by: As directed    Increase activity slowly   Complete by: As directed      Allergies as of 05/28/2019   No Known Allergies     Medication List    STOP taking these medications   furosemide 20 MG tablet Commonly known as: LASIX   iron polysaccharides 150 MG capsule Commonly known as: NIFEREX   Poly-Iron 150 150 MG capsule Generic drug: iron polysaccharides     TAKE these medications   brimonidine 0.2 % ophthalmic solution Commonly known as: ALPHAGAN Place 1 drop into both eyes 2 (two) times daily.   dorzolamide-timolol 22.3-6.8 MG/ML ophthalmic solution Commonly known as: COSOPT APPOINTMENT OVERDUE, instill 1 drop in both eyes twice daily What changed:   how much to take  how to take this  when to take this  additional instructions   finasteride 5 MG tablet Commonly known as: PROSCAR Take 1 tablet (5 mg total) by mouth daily. Start taking on: December 24, [id number]   folic acid Q000111Q MCG tablet Commonly known as: FOLVITE Take 400 mcg by mouth daily in the afternoon.   hydrochlorothiazide 50 MG tablet Commonly known as: HYDRODIURIL Take 12.5 mg by mouth daily.   Magnesium 250 MG Tabs Take 250 mg by mouth as needed (for headaches).   pilocarpine 2 % ophthalmic solution Commonly known as: PILOCAR Place 1 drop into the right eye 4 (four) times daily.   potassium chloride SA 20 MEQ tablet Commonly known as: KLOR-CON Take 1 tablet (20 mEq total) by mouth daily.   pyridOXINE 100 MG tablet Commonly known as: VITAMIN B-6 Take 100 mg by mouth every morning.    vitamin B-12 500 MCG tablet Commonly known as: CYANOCOBALAMIN Take 1,000 mcg by mouth every morning.   vitamin C 500 MG tablet Commonly known as: ASCORBIC ACID Take 1,000 mg by mouth every morning.      Follow-up Information    [facility].   Why: Office will call to arrange f/u in 2-3 weeks.  Contact information: [place]       [name], NP Follow up.   Specialty: Geriatric Medicine Contact information: [place]. [place] [address] [phone]          No Known Allergies  Consultations: Urology  Procedures/Studies: CT ABDOMEN PELVIS W CONTRAST  Result Date: 05/27/2019 CLINICAL DATA:  .Hematuria, unknown cause Gross hematuira; massive prostate. NEEDS delayed images to eval kideny/upper tracts. Eval lymphadenopathy EXAM: CT ABDOMEN AND PELVIS WITH CONTRAST TECHNIQUE: Multidetector CT imaging of the abdomen and pelvis was performed using the standard protocol following bolus administration of intravenous contrast. CONTRAST:  190mL OMNIPAQUE IOHEXOL 300 MG/ML  SOLN COMPARISON:  CT 12/23/2012 FINDINGS: Lower chest: Lung bases are clear. Hepatobiliary: No focal hepatic lesion. No biliary duct dilatation. Gallbladder is normal. Common bile duct is normal. Pancreas: Pancreas is normal. No ductal dilatation. No pancreatic inflammation. Spleen: Normal spleen Adrenals/urinary tract: Adrenal glands normal. There bilateral nonenhancing renal cysts. No enhancing renal cortical lesion identified. No filling defects within collecting systems ureters. No ureteral calculi or obstruction. Prostate is massively enlarged measuring 11.9 cm in craniocaudad dimension (image 64/10/sagittal). This compares to 11.3 cm on CT from 2014 consistent with interval minimal enlargement over this long time period. Superior to the bladder there is high-density material measuring 3 cm in thickness (image 64/10). No  evidence enhancement from the pre to postcontrast imaging. On the delayed imaging excreted contrast surrounds this mass creating a large filling defect. This filling defect occupies the near entirety of the elevated bladder lumen measuring 7.9 x 3.9 cm. There is gas within the bladder. No filling defects collecting systems ureters described above Stomach/Bowel: Stomach, small-bowel cecum normal. Post appendectomy. Colon rectosigmoid normal. Vascular/Lymphatic: Abdominal aorta is normal caliber. No periportal or retroperitoneal adenopathy. No pelvic adenopathy. Reproductive: Prostate massively enlarged as described in the bladder section. Prostate volume equals 11.8 by 11.3 x 8.6 cm (volume = 600 cm^3) Fat filled LEFT inguinal hernia. The small bilateral hydroceles noted. Other: No free fluid. Musculoskeletal: No aggressive osseous lesion. IMPRESSION: 1. Prostate gland is massively enlarged (600 cubic cm) but similar to comparison CT 2014. 2. Large filling defect within the lumen of the bladder superior to the massive prostate suggesting hematoma. Bladder mass less favored. 3. Gas within bladder may relate to instrumentation. 4. No filling defect within the renal collecting systems or ureters. 5. No enhancing renal cortical lesion. Bilateral Bosniak I renal cysts. 6. Bilateral small hydroceles. 7. No lymphadenopathy. Electronically Signed   By: Suzy Bouchard M.D.   On: 05/27/2019 12:01   US RENAL  Result Date: 05/25/2019 CLINICAL DATA:  Hematuria EXAM: RENAL / URINARY TRACT ULTRASOUND COMPLETE COMPARISON:  01/14/2018 FINDINGS: Right Kidney: Renal measurements: 11.8 x 6.9 x 6.2 cm = volume: 266 mL. Upper and lower pole cysts measuring up to 5.4 cm at the lower pole Left Kidney: Renal measurements: 10 x 5 x 5 cm = volume: 140 mL. Despite smaller size no appreciable cortical thinning, some of the asymmetry may be due to angle of imaging. No hydronephrosis. Central cyst measuring up to 4.7 cm Bladder: Incomplete  distension of the bladder with mobile avascular echogenic material that is most consistent with clot. There is mass along the bladder base with bladder up lifting, correlating with history of massive BPH. Grossly stable appearance when compared with 2018 CT. IMPRESSION: 1. Mobile material in the bladder most consistent with blood clot. The bladder is not over distended. 2. Massive enlargement of the prostate. Electronically Signed   By: Monte Fantasia M.D.   On: 05/25/2019 08:02    (Echo, Carotid, EGD, Colonoscopy, ERCP)    Subjective:  Sitting up in bed eating his food no complaints urine clearing up anxious to go home Discharge Exam: Vitals:   05/28/19 0307 05/28/19 0544  BP: 124/76 128/77  Pulse: 60 63  Resp: 14 16  Temp: 98 F (36.7 C) 98.4 F (36.9 C)  SpO2: 100% 100%   Vitals:   05/28/19 0006 05/28/19 0235 05/28/19 0307 05/28/19 0544  BP: 113/65 111/82 124/76 128/77  Pulse: (!) 58 61 60 63  Resp: 20 16 14 16   Temp: 98 F (36.7 C) 97.8 F (36.6  C) 98 F (36.7 C) 98.4 F (36.9 C)  TempSrc: Oral Oral Oral Oral  SpO2: 100% 100% 100% 100%  Weight:      Height:        General: Pt is alert, awake, not in acute distress Cardiovascular: RRR, S1/S2 +, no rubs, no gallops Respiratory: CTA bilaterally, no wheezing, no rhonchi Abdominal: Soft, NT, ND, bowel sounds + Extremities: no edema, no cyanosis    The results of significant diagnostics from this hospitalization (including imaging, microbiology, ancillary and laboratory) are listed below for reference.     Microbiology: Recent Results (from the past 240 hour(s))  SARS CORONAVIRUS 2 (TAT 6-24 HRS) Nasopharyngeal Nasopharyngeal Swab     Status: None   Collection Time: 05/25/19  4:20 AM   Specimen: Nasopharyngeal Swab  Result Value Ref Range Status   SARS Coronavirus 2 NEGATIVE NEGATIVE Final    Comment: (NOTE) SARS-CoV-2 target nucleic acids are NOT DETECTED. The SARS-CoV-2 RNA is generally detectable in upper and  lower respiratory specimens during the acute phase of infection. Negative results do not preclude SARS-CoV-2 infection, do not rule out co-infections with other pathogens, and should not be used as the sole basis for treatment or other patient management decisions. Negative results must be combined with clinical observations, patient history, and epidemiological information. The expected result is Negative. Fact Sheet for Patients: SugarRoll.be Fact Sheet for Healthcare Providers: https://www.woods-[employer]ai Skelly.com/ This test is not yet approved or cleared by the [place] FDA and  has been authorized for detection and/or diagnosis of SARS-CoV-2 by FDA under an Emergency Use Authorization (EUA). This EUA will remain  in effect (meaning this test can be used) for the duration of the COVID-19 declaration under Section 56 4(b)(1) of the Act, 21 U.S.C. section 360bbb-3(b)(1), unless the authorization is terminated or revoked sooner. Performed at [facility], [place] [address], [place], [place] [address]   Urine culture     Status: Abnormal   Collection Time: 05/25/19  4:36 AM   Specimen: Urine, Clean Catch  Result Value Ref Range Status   Specimen Description URINE, CLEAN CATCH  Final   Special Requests   Final    NONE Performed at [facility], [place] [address], [place],  [address]    Culture 20,000 COLONIES/mL VIRIDANS STREPTOCOCCUS (A)  Final   Report Status 05/27/2019 FINAL  Final     Labs: BNP (last 3 results) No results for input(s): BNP in the last 8760 hours. Basic Metabolic Panel: Recent Labs  Lab 05/25/19 0207 05/25/19 0655 05/26/19 0721 05/27/19 0235 05/28/19 0725  NA 135  --  140 142 139  K 3.0*  --  3.9 3.8 4.0  CL 100  --  109 109 110  CO2 20*  --  21* 21* 21*  GLUCOSE 148*  --  127* 133* 134*  BUN 17  --  16 19 20   CREATININE 1.31*  --  1.23 1.55* 1.04  CALCIUM 8.3*  --  7.6* 7.7* 7.8*  MG  --  2.1   --   --   --    Liver Function Tests: Recent Labs  Lab 05/25/19 0207  AST 24  ALT 20  ALKPHOS 49  BILITOT 1.0  PROT 6.2*  ALBUMIN 3.1*   No results for input(s): LIPASE, AMYLASE in the last 168 hours. No results for input(s): AMMONIA in the last 168 hours. CBC: Recent Labs  Lab 05/25/19 0207 05/25/19 2103 05/26/19 1855 05/27/19 0235 05/27/19 0954 05/27/19 1944 05/28/19  0725  WBC 11.5* 12.0*  --  13.2*  --  7.6 10.0  HGB 4.9* 7.4* 6.5* 6.9* 6.1* 7.4* 8.7*  HCT 16.1* 22.8* 19.9* 20.6* 18.5* 23.1* 27.0*  MCV 86.6 86.0  --  85.8  --  92.8 88.2  PLT 370 258  --  205  --  181 199   Cardiac Enzymes: No results for input(s): CKTOTAL, CKMB, CKMBINDEX, TROPONINI in the last 168 hours. BNP: Invalid input(s): POCBNP CBG: No results for input(s): GLUCAP in the last 168 hours. D-Dimer No results for input(s): DDIMER in the last 72 hours. Hgb A1c No results for input(s): HGBA1C in the last 72 hours. Lipid Profile No results for input(s): CHOL, HDL, LDLCALC, TRIG, CHOLHDL, LDLDIRECT in the last 72 hours. Thyroid function studies No results for input(s): TSH, T4TOTAL, T3FREE, THYROIDAB in the last 72 hours.  Invalid input(s): FREET3 Anemia work up Recent Labs    05/25/19 1816  FERRITIN 11*  TIBC 371  IRON 38*   Urinalysis    Component Value Date/Time   COLORURINE RED (A) 05/25/2019 0436   APPEARANCEUR TURBID (A) 05/25/2019 0436   LABSPEC 1.020 05/25/2019 0436   PHURINE 7.5 05/25/2019 0436   GLUCOSEU 250 (A) 05/25/2019 0436   HGBUR LARGE (A) 05/25/2019 0436   BILIRUBINUR NEGATIVE 05/25/2019 0436   KETONESUR 15 (A) 05/25/2019 0436   PROTEINUR >300 (A) 05/25/2019 0436   UROBILINOGEN 0.2 12/27/2012 2241   NITRITE POSITIVE (A) 05/25/2019 0436   LEUKOCYTESUR TRACE (A) 05/25/2019 0436   Sepsis Labs Invalid input(s): PROCALCITONIN,  WBC,  LACTICIDVEN Microbiology Recent Results (from the past 240 hour(s))  SARS CORONAVIRUS 2 (TAT 6-24 HRS) Nasopharyngeal  Nasopharyngeal Swab     Status: None   Collection Time: 05/25/19  4:20 AM   Specimen: Nasopharyngeal Swab  Result Value Ref Range Status   SARS Coronavirus 2 NEGATIVE NEGATIVE Final    Comment: (NOTE) SARS-CoV-2 target nucleic acids are NOT DETECTED. The SARS-CoV-2 RNA is generally detectable in upper and lower respiratory specimens during the acute phase of infection. Negative results do not preclude SARS-CoV-2 infection, do not rule out co-infections with other pathogens, and should not be used as the sole basis for treatment or other patient management decisions. Negative results must be combined with clinical observations, patient history, and epidemiological information. The expected result is Negative. Fact Sheet for Patients: SugarRoll.be Fact Sheet for Healthcare Providers: https://www.woods-[employer].com/ This test is not yet approved or cleared by the [place] FDA and  has been authorized for detection and/or diagnosis of SARS-CoV-2 by FDA under an Emergency Use Authorization (EUA). This EUA will remain  in effect (meaning this test can be used) for the duration of the COVID-19 declaration under Section 56 4(b)(1) of the Act, 21 U.S.C. section 360bbb-3(b)(1), unless the authorization is terminated or revoked sooner. Performed at [facility], [place] [address], [place], [place] [address]   Urine culture     Status: Abnormal   Collection Time: 05/25/19  4:36 AM   Specimen: Urine, Clean Catch  Result Value Ref Range Status   Specimen Description URINE, CLEAN CATCH  Final   Special Requests   Final    NONE Performed at [facility], [place] [address] [place], [place] [address]    Culture 20,000 COLONIES/mL VIRIDANS STREPTOCOCCUS (A)  Final   Report Status 05/27/2019 FINAL  Final     Time coordinating discharge: 35 minutes  SIGNED:   Georgette Shell, MD  [facility] 05/28/2019, 12:31 PM  Pager   If  7PM-7AM, please contact night-coverage [email or web address] Password [email or web address]

## 2019-05-28 NOTE — Progress Notes (Signed)
Patient ID: [name] Wool., male   DOB: 1946-02-23, 73 y.o.   MRN: [id number] 1 Day Post-Op  Subjective: He is doing well s/p clot evacuation.  Urine is clear and he has no complaints. Hgb 8.7.  ROS:  Review of Systems  All other systems reviewed and are negative.   Anti-infectives: Anti-infectives (From admission, onward)   Start     Dose/Rate Route Frequency Ordered Stop   05/26/19 0700  cefTRIAXone (ROCEPHIN) 1 g in sodium chloride 0.9 % 100 mL IVPB     1 g 200 mL/hr over 30 Minutes Intravenous Every 24 hours 05/25/19 1512     05/25/19 0530  cefTRIAXone (ROCEPHIN) 1 g in sodium chloride 0.9 % 100 mL IVPB     1 g 200 mL/hr over 30 Minutes Intravenous  Once 05/25/19 0520 05/25/19 0752      Current Facility-Administered Medications  Medication Dose Route Frequency Provider Last Rate Last Admin  . 0.9 %  sodium chloride infusion (Manually program via Guardrails IV Fluids)   Intravenous Once Hosie Poisson, MD      . 0.9 %  sodium chloride infusion (Manually program via Guardrails IV Fluids)   Intravenous Once Hosie Poisson, MD      . 0.9 %  sodium chloride infusion   Intravenous Continuous Hosie Poisson, MD 75 mL/hr at 05/27/19 2253 New Bag at 05/27/19 2253  . acetaminophen (TYLENOL) tablet 650 mg  650 mg Oral Q6H PRN Hosie Poisson, MD       Or  . acetaminophen (TYLENOL) suppository 650 mg  650 mg Rectal Q6H PRN Hosie Poisson, MD      . ascorbic acid (VITAMIN C) tablet 1,000 mg  1,000 mg Oral q morning - 10a Hosie Poisson, MD   1,000 mg at 05/25/19 1127  . brimonidine (ALPHAGAN) 0.2 % ophthalmic solution 1 drop  1 drop Both Eyes BID Hosie Poisson, MD   1 drop at 05/28/19 [phone]  . cefTRIAXone (ROCEPHIN) 1 g in sodium chloride 0.9 % 100 mL IVPB  1 g Intravenous Q24H Hosie Poisson, MD 200 mL/hr at 05/27/19 0833 1 g at 05/27/19 0833  . Chlorhexidine Gluconate Cloth 2 % PADS 6 each  6 each Topical Daily Hosie Poisson, MD   6 each at 05/27/19 1158  . dorzolamide-timolol (COSOPT) 22.3-6.8  MG/ML ophthalmic solution 1 drop  1 drop Both Eyes BID Hosie Poisson, MD   1 drop at 05/28/19 0631  . finasteride (PROSCAR) tablet 5 mg  5 mg Oral Daily Hosie Poisson, MD   5 mg at 05/27/19 1148  . folic acid (FOLVITE) tablet 1 mg  1 mg Oral Daily Hosie Poisson, MD   1 mg at 05/25/19 1128  . iron polysaccharides (NIFEREX) capsule 150 mg  150 mg Oral Daily Hosie Poisson, MD   150 mg at 05/25/19 1126  . mirabegron ER (MYRBETRIQ) tablet 50 mg  50 mg Oral Daily Hosie Poisson, MD   50 mg at 05/27/19 1147  . opium-belladonna (B&O SUPPRETTES) 16.2-60 MG suppository 1 suppository  1 suppository Rectal Q8H PRN Hosie Poisson, MD   1 suppository at 05/26/19 0900  . oxybutynin (DITROPAN) tablet 5 mg  5 mg Oral TID Hosie Poisson, MD   5 mg at 05/27/19 2323  . oxyCODONE (Oxy IR/ROXICODONE) immediate release tablet 5 mg  5 mg Oral Q4H PRN Hosie Poisson, MD   5 mg at 05/28/19 0233  . pilocarpine (PILOCAR) 2 % ophthalmic solution 1 drop  1 drop Right Eye TID AC & HS  Hosie Poisson, MD   1 drop at 05/28/19 0524  . pyridOXINE (VITAMIN B-6) tablet 100 mg  100 mg Oral q morning - 10a Hosie Poisson, MD   100 mg at 05/25/19 1126  . sodium chloride irrigation 0.9 % 3,000 mL  3,000 mL Irrigation Continuous Hosie Poisson, MD   3,000 mL at 05/26/19 0345  . vitamin B-12 (CYANOCOBALAMIN) tablet 1,000 mcg  1,000 mcg Oral q morning - 10a Hosie Poisson, MD   1,000 mcg at 05/25/19 1128     Objective: Vital signs in last 24 hours: Temp:  [97.2 F (36.2 C)-99.1 F (37.3 C)] 98.4 F (36.9 C) (12/23 0544) Pulse Rate:  [57-79] 63 (12/23 0544) Resp:  [12-20] 16 (12/23 0544) BP: (111-148)/(65-82) 128/77 (12/23 0544) SpO2:  [100 %] 100 % (12/23 0544) Weight:  [90 kg-90.7 kg] 90 kg (12/22 2045)  Intake/Output from previous day: 12/22 0701 - 12/23 0700 In: 2089 [I.V.:600; Blood:639; IV Piggyback:250] Out: [id number] [Urine:3150] Intake/Output this shift: No intake/output data recorded.   Physical Exam Vitals reviewed.   Constitutional:      Appearance: Normal appearance.  Genitourinary:    Comments: Urine clear in foley tubing.  Neurological:     Mental Status: He is alert.     Lab Results:  Recent Labs    05/27/19 1944 05/28/19 0725  WBC 7.6 10.0  HGB 7.4* 8.7*  HCT 23.1* 27.0*  PLT 181 199   BMET Recent Labs    05/26/19 0721 05/27/19 0235  NA 140 142  K 3.9 3.8  CL 109 109  CO2 21* 21*  GLUCOSE 127* 133*  BUN 16 19  CREATININE 1.23 1.55*  CALCIUM 7.6* 7.7*   PT/INR No results for input(s): LABPROT, INR in the last 72 hours. ABG No results for input(s): PHART, HCO3 in the last 72 hours.  Invalid input(s): PCO2, PO2  Studies/Results: CT ABDOMEN PELVIS W CONTRAST  Result Date: 05/27/2019 CLINICAL DATA:  .Hematuria, unknown cause Gross hematuira; massive prostate. NEEDS delayed images to eval kideny/upper tracts. Eval lymphadenopathy EXAM: CT ABDOMEN AND PELVIS WITH CONTRAST TECHNIQUE: Multidetector CT imaging of the abdomen and pelvis was performed using the standard protocol following bolus administration of intravenous contrast. CONTRAST:  19mL OMNIPAQUE IOHEXOL 300 MG/ML  SOLN COMPARISON:  CT 12/23/2012 FINDINGS: Lower chest: Lung bases are clear. Hepatobiliary: No focal hepatic lesion. No biliary duct dilatation. Gallbladder is normal. Common bile duct is normal. Pancreas: Pancreas is normal. No ductal dilatation. No pancreatic inflammation. Spleen: Normal spleen Adrenals/urinary tract: Adrenal glands normal. There bilateral nonenhancing renal cysts. No enhancing renal cortical lesion identified. No filling defects within collecting systems ureters. No ureteral calculi or obstruction. Prostate is massively enlarged measuring 11.9 cm in craniocaudad dimension (image 64/10/sagittal). This compares to 11.3 cm on CT from 2014 consistent with interval minimal enlargement over this long time period. Superior to the bladder there is high-density material measuring 3 cm in thickness (image  64/10). No evidence enhancement from the pre to postcontrast imaging. On the delayed imaging excreted contrast surrounds this mass creating a large filling defect. This filling defect occupies the near entirety of the elevated bladder lumen measuring 7.9 x 3.9 cm. There is gas within the bladder. No filling defects collecting systems ureters described above Stomach/Bowel: Stomach, small-bowel cecum normal. Post appendectomy. Colon rectosigmoid normal. Vascular/Lymphatic: Abdominal aorta is normal caliber. No periportal or retroperitoneal adenopathy. No pelvic adenopathy. Reproductive: Prostate massively enlarged as described in the bladder section. Prostate volume equals 11.8 by 11.3 x 8.6 cm (volume =  600 cm^3) Fat filled LEFT inguinal hernia. The small bilateral hydroceles noted. Other: No free fluid. Musculoskeletal: No aggressive osseous lesion. IMPRESSION: 1. Prostate gland is massively enlarged (600 cubic cm) but similar to comparison CT 2014. 2. Large filling defect within the lumen of the bladder superior to the massive prostate suggesting hematoma. Bladder mass less favored. 3. Gas within bladder may relate to instrumentation. 4. No filling defect within the renal collecting systems or ureters. 5. No enhancing renal cortical lesion. Bilateral Bosniak I renal cysts. 6. Bilateral small hydroceles. 7. No lymphadenopathy. Electronically Signed   By: Suzy Bouchard M.D.   On: 05/27/2019 12:01     Assessment and Plan: BPH with hematuria and Clot retention doing well s/p clot evac and fulguration.   Will give TOV.  Continue finasteride.   Elevated PSA with nodular prostate.  He will need office f/u in 2-3 weeks and we can repeat  PSA then and work on getting him scheduled for a prostate [place] and biopsy.         LOS: 2 days    Irine Seal 05/28/2019 [phone]

## 2019-05-28 NOTE — Progress Notes (Addendum)
Went over discharge papers with patient.  All questions answered.  Patient teaching done on foley care.  VSS.  Attempted to call brother to go over discharge instructions but call went to voicemail.  Patient wheeled out to taxi.

## 2019-05-28 NOTE — Evaluation (Signed)
Physical Therapy One Time Evaluation Patient Details Name: [name] [name]. MRN: [id number] DOB: April 16, 1946 Today's Date: 05/28/2019   History of Present Illness  Pt is a 72 year old male admitted for Gross hematuria probably from massive prostate enlargement.  Urology consulted and he underwent urethral dilatation with Foley catheter insertion and bladder  irrigation  Clinical Impression  Patient evaluated by Physical Therapy with no further acute PT needs identified. All education has been completed and the patient has no further questions.  See below for any follow-up Physical Therapy or equipment needs. PT is signing off. Thank you for this referral.  Pt reports no issues with mobility and declined ambulating due to visual impairment and not in his home environment however able to stand at EOB and balance does not appear impaired.  Pt feels ready and safe for discharge home today.  Pt declined any further needs.  PT to sign off.    Follow Up Recommendations No PT follow up    Equipment Recommendations  None recommended by PT    Recommendations for Other Services       Precautions / Restrictions Precautions Precautions: Fall Precaution Comments: blind      Mobility  Bed Mobility Overal bed mobility: Needs Assistance Bed Mobility: Supine to Sit;Sit to Supine     Supine to sit: Supervision Sit to supine: Supervision   General bed mobility comments: supervision only due to vision impairment  Transfers Overall transfer level: Modified independent               General transfer comment: provided a hand due to vision impairment however pt not requiring any assist  Ambulation/Gait             General Gait Details: declined ambulating due to not in his home environment however able to shift weight without difficulty and "dance" with therapist (provided a hand to spin therapist in place) no LOB or unsteadiness observed  Stairs            Wheelchair  Mobility    Modified Rankin (Stroke Patients Only)       Balance Overall balance assessment: No apparent balance deficits (not formally assessed)                                           Pertinent Vitals/Pain Pain Assessment: No/denies pain    Home Living Family/patient expects to be discharged to:: Private residence Living Arrangements: Alone             Home Equipment: None      Prior Function Level of Independence: Independent               Hand Dominance        Extremity/Trunk Assessment   Upper Extremity Assessment Upper Extremity Assessment: Overall WFL for tasks assessed    Lower Extremity Assessment Lower Extremity Assessment: Overall WFL for tasks assessed    Cervical / Trunk Assessment Cervical / Trunk Assessment: Normal  Communication   Communication: No difficulties  Cognition Arousal/Alertness: Awake/alert Behavior During Therapy: WFL for tasks assessed/performed Overall Cognitive Status: Within Functional Limits for tasks assessed                                 General Comments: Pt aware of current medical issues, stated some of his history.  Pt also reporting "truths" about various topics (UFOs and "the great black grandmother we are all descended from")      General Comments      Exercises     Assessment/Plan    PT Assessment Patent does not need any further PT services  PT Problem List         PT Treatment Interventions      PT Goals (Current goals can be found in the Care Plan section)  Acute Rehab PT Goals PT Goal Formulation: All assessment and education complete, DC therapy    Frequency     Barriers to discharge        Co-evaluation               AM-PAC PT "6 Clicks" Mobility  Outcome Measure Help needed turning from your back to your side while in a flat bed without using bedrails?: None Help needed moving from lying on your back to sitting on the side of a flat  bed without using bedrails?: None Help needed moving to and from a bed to a chair (including a wheelchair)?: None Help needed standing up from a chair using your arms (e.g., wheelchair or bedside chair)?: None Help needed to walk in hospital room?: A Little Help needed climbing 3-5 steps with a railing? : A Little 6 Click Score: 22    End of Session   Activity Tolerance: Patient tolerated treatment well Patient left: in bed;with call bell/phone within reach Nurse Communication: Mobility status PT Visit Diagnosis: Difficulty in walking, not elsewhere classified (R26.2)    Time: [id number] PT Time Calculation (min) (ACUTE ONLY): 30 min   Charges:   PT Evaluation $PT Eval Low Complexity: 1 Low         Kati PT, DPT Acute Rehabilitation Services Office: [phone]  Trena Platt 05/28/2019, 2:42 PM

## 2019-05-29 LAB — BPAM RBC
Blood Product Expiration Date: 202101172359
Blood Product Expiration Date: 202101192359
Blood Product Expiration Date: 202101202359
Blood Product Expiration Date: 202101202359
Blood Product Expiration Date: 202101202359
Blood Product Expiration Date: 202101202359
ISSUE DATE / TIME: 202012200400
ISSUE DATE / TIME: 202012200722
ISSUE DATE / TIME: 202012201234
ISSUE DATE / TIME: 202012212138
ISSUE DATE / TIME: 202012221308
Unit Type and Rh: 7300
Unit Type and Rh: 7300
Unit Type and Rh: 7300
Unit Type and Rh: 7300
Unit Type and Rh: 7300
Unit Type and Rh: 7300

## 2019-05-29 LAB — TYPE AND SCREEN
ABO/RH(D): B POS
Antibody Screen: NEGATIVE
Unit division: 0
Unit division: 0
Unit division: 0
Unit division: 0
Unit division: 0
Unit division: 0

## 2019-05-31 LAB — TYPE AND SCREEN
ABO/RH(D): B POS
Antibody Screen: NEGATIVE
Unit division: 0
Unit division: 0

## 2019-05-31 LAB — BPAM RBC
Blood Product Expiration Date: 202101192359
Blood Product Expiration Date: 202101192359
ISSUE DATE / TIME: 202012230245
Unit Type and Rh: 7300
Unit Type and Rh: 7300

## 2019-06-02 ENCOUNTER — Encounter: Payer: Self-pay | Admitting: Nurse Practitioner

## 2019-06-11 ENCOUNTER — Encounter: Payer: Self-pay | Admitting: Nurse Practitioner

## 2019-06-11 ENCOUNTER — Telehealth: Payer: Self-pay | Admitting: Nurse Practitioner

## 2019-06-11 NOTE — Telephone Encounter (Signed)
Fyi.

## 2019-06-11 NOTE — Telephone Encounter (Signed)
Belenda Cruise spoke with pt to verify address & his appt today 06/11/19. She set up transportation to pick him up for his 1p appt.  When driver arrived for pickup, the person that came to the door claimed to not know him or that he lived there.  [facility] services will be glad to provide transportation, but can't locate or contact pt today 06/11/19  Thanks, Lattie Haw

## 2019-07-02 ENCOUNTER — Encounter: Payer: Self-pay | Admitting: Nurse Practitioner

## 2019-07-02 ENCOUNTER — Telehealth: Payer: Self-pay | Admitting: Family

## 2019-07-02 ENCOUNTER — Ambulatory Visit (INDEPENDENT_AMBULATORY_CARE_PROVIDER_SITE_OTHER): Payer: Medicare Other | Admitting: Nurse Practitioner

## 2019-07-02 ENCOUNTER — Other Ambulatory Visit: Payer: Self-pay

## 2019-07-02 VITALS — BP 136/78 | HR 85 | Temp 97.1°F | Ht 67.0 in | Wt 199.0 lb

## 2019-07-02 DIAGNOSIS — H548 Legal blindness, as defined in USA: Secondary | ICD-10-CM

## 2019-07-02 DIAGNOSIS — R609 Edema, unspecified: Secondary | ICD-10-CM

## 2019-07-02 DIAGNOSIS — Z0000 Encounter for general adult medical examination without abnormal findings: Secondary | ICD-10-CM

## 2019-07-02 DIAGNOSIS — I10 Essential (primary) hypertension: Secondary | ICD-10-CM

## 2019-07-02 DIAGNOSIS — Z1159 Encounter for screening for other viral diseases: Secondary | ICD-10-CM

## 2019-07-02 DIAGNOSIS — Z23 Encounter for immunization: Secondary | ICD-10-CM | POA: Diagnosis not present

## 2019-07-02 DIAGNOSIS — R972 Elevated prostate specific antigen [PSA]: Secondary | ICD-10-CM | POA: Diagnosis not present

## 2019-07-02 DIAGNOSIS — N401 Enlarged prostate with lower urinary tract symptoms: Secondary | ICD-10-CM

## 2019-07-02 DIAGNOSIS — N179 Acute kidney failure, unspecified: Secondary | ICD-10-CM | POA: Diagnosis not present

## 2019-07-02 DIAGNOSIS — D62 Acute posthemorrhagic anemia: Secondary | ICD-10-CM | POA: Diagnosis not present

## 2019-07-02 DIAGNOSIS — R338 Other retention of urine: Secondary | ICD-10-CM

## 2019-07-02 NOTE — Telephone Encounter (Signed)
Phone call received from [facility] reporting critical lab results Hgb 5.5 attempt to call patient at Telephone # [phone] voicemail states number has been changed.Patient's Contact person Nash Dimmer contacted at [phone] stated patient has new phone # [phone] but states patient usually turns off phone at bedtime.Tried to reach patient on the new phone number but has a voicemail that has not been set up.Brother states will text patient then will contact office in the morning.I've advised patient's brother that Hgb is critical low will need to go to the ED for evaluation.He verbalized understanding states patient unable to drive and has no one else to drive him.Advised to call 9-1-1.

## 2019-07-02 NOTE — Progress Notes (Signed)
Subjective:   [name] Salon. is a 74 y.o. male who presents for Medicare Annual/Subsequent preventive examination.  Review of Systems:   Cardiac Risk Factors include: advanced age (>76men, >62 women);male gender;family history of premature cardiovascular disease;obesity (BMI >30kg/m2);sedentary lifestyle     Objective:    Vitals: BP 136/78 (BP Location: Left Arm, Patient Position: Sitting, Cuff Size: Normal)   Pulse 85   Temp (!) 97.1 F (36.2 C) (Temporal)   Ht 5\' 7"  (1.702 m) Comment: with shoes  Wt 199 lb (90.3 kg)   SpO2 99%   BMI 31.17 kg/m   Body mass index is 31.17 kg/m.  Advanced Directives 07/02/2019 07/02/2019 05/25/2019 01/14/2018 04/14/2017 04/04/2017 02/16/2016  Does Patient Have a Medical Advance Directive? No No No No No No No  Does patient want to make changes to medical advance directive? Yes (MAU/Ambulatory/Procedural Areas - Information given) Yes (MAU/Ambulatory/Procedural Areas - Information given) - - - - -  Would patient like information on creating a medical advance directive? - - - No - Patient declined Yes (ED - Information included in AVS) - Yes - Educational materials given  Pre-existing out of facility DNR order (yellow form or pink MOST form) - - - - - - -    Tobacco Social History   Tobacco Use  Smoking Status Former Smoker  . Types: Cigarettes  . Quit date: 06/05/1968  . Years since quitting: 51.1  Smokeless Tobacco Never Used     Counseling given: Not Answered   Clinical Intake:  Pre-visit preparation completed: Yes  Pain : No/denies pain     BMI - recorded: 31.17 Nutritional Status: BMI > 30  Obese Nutritional Risks: None Diabetes: No  How often do you need to have someone help you when you read instructions, pamphlets, or other written materials from your doctor or pharmacy?: 2 - Rarely What is the last grade level you completed in school?: college  Interpreter Needed?: No     Past Medical History:  Diagnosis Date    . Blind   . Disorder of bone and cartilage, unspecified   . Edema   . Elevated prostate specific antigen (PSA)   . Hypertension   . Hypopotassemia   . Nonspecific abnormal results of liver function study   . Unspecified glaucoma(365.9)    Past Surgical History:  Procedure Laterality Date  . CYSTOSCOPY WITH FULGERATION N/A 01/17/2018   Procedure: CYSTOSCOPY CLOT EVACUATION WITH FULGERATION TUR BIOPSY OF BLADDER NECK;  Surgeon: Irine Seal, MD;  Location: [facility];  Service: Urology;  Laterality: N/A;  . EYE SURGERY Bilateral 2009   for glaucoma  . EYE SURGERY Left 2014  . HERNIA REPAIR Right 2003   Dr. Jeraldine Loots  . LAPAROSCOPIC APPENDECTOMY N/A 12/24/2012   Procedure:  LAPAROSCOPIC APPENDECTOMY ;  Surgeon: Pedro Earls, MD;  Location: [facility];  Service: General;  Laterality: N/A;  . TRANSURETHRAL RESECTION OF PROSTATE N/A 05/27/2019   Procedure: Cystoscopy Clot Evacuation and Fulgration;  Surgeon: Ardis Hughs, MD;  Location: [facility];  Service: Urology;  Laterality: N/A;   Family History  Problem Relation Age of Onset  . Kidney failure Mother    Social History   Socioeconomic History  . Marital status: Single    Spouse name: Not on file  . Number of children: Not on file  . Years of education: Not on file  . Highest education level: Not on file  Occupational History  . Not on file  Tobacco Use  .  Smoking status: Former Smoker    Types: Cigarettes    Quit date: 06/05/1968    Years since quitting: 51.1  . Smokeless tobacco: Never Used  Substance and Sexual Activity  . Alcohol use: No    Comment: STOPPED IN 1970S  . Drug use: No  . Sexual activity: Never  Other Topics Concern  . Not on file  Social History Narrative  . Not on file   Social Determinants of Health   Financial Resource Strain:   . Difficulty of Paying Living Expenses: Not on file  Food Insecurity:   . Worried About Charity fundraiser in the Last Year: Not on file  . Ran Out of Food in the  Last Year: Not on file  Transportation Needs:   . Lack of Transportation (Medical): Not on file  . Lack of Transportation (Non-Medical): Not on file  Physical Activity:   . Days of Exercise per Week: Not on file  . Minutes of Exercise per Session: Not on file  Stress:   . Feeling of Stress : Not on file  Social Connections:   . Frequency of Communication with Friends and Family: Not on file  . Frequency of Social Gatherings with Friends and Family: Not on file  . Attends Religious Services: Not on file  . Active Member of Clubs or Organizations: Not on file  . Attends Archivist Meetings: Not on file  . Marital Status: Not on file    Outpatient Encounter Medications as of 07/02/2019  Medication Sig  . brimonidine (ALPHAGAN) 0.2 % ophthalmic solution Place 1 drop into both eyes 2 (two) times daily.  . cyanocobalamin 500 MCG tablet Take 1,000 mcg by mouth every morning.   . dorzolamide-timolol (COSOPT) 22.3-6.8 MG/ML ophthalmic solution APPOINTMENT OVERDUE, instill 1 drop in both eyes twice daily  . folic acid (FOLVITE) [id number] MCG tablet Take 400 mcg by mouth daily in the afternoon.   . Magnesium 250 MG TABS Take 250 mg by mouth as needed (for headaches).   . pilocarpine (PILOCAR) 2 % ophthalmic solution Place 1 drop into the right eye 4 (four) times daily.   Marland Kitchen pyridOXINE (VITAMIN B-6) 100 MG tablet Take 100 mg by mouth every morning.  . vitamin C (ASCORBIC ACID) 500 MG tablet Take 1,000 mg by mouth every morning.   . hydrochlorothiazide (MICROZIDE) 12.5 MG capsule Take 12.5 mg by mouth daily.  . potassium chloride SA (K-DUR,KLOR-CON) 20 MEQ tablet Take 1 tablet (20 mEq total) by mouth daily.  . [DISCONTINUED] finasteride (PROSCAR) 5 MG tablet Take 1 tablet (5 mg total) by mouth daily.  . [DISCONTINUED] hydrochlorothiazide (HYDRODIURIL) 50 MG tablet Take 12.5 mg by mouth daily.   No facility-administered encounter medications on file as of 07/02/2019.    Activities of Daily  Living In your present state of health, do you have any difficulty performing the following activities: 07/02/2019 05/27/2019  Hearing? N N  Vision? Y Y  Difficulty concentrating or making decisions? N N  Walking or climbing stairs? N N  Dressing or bathing? N Y  Doing errands, shopping? N N  Preparing Food and eating ? N -  Using the Toilet? N -  In the past six months, have you accidently leaked urine? Y -  Do you have problems with loss of bowel control? N -  Managing your Medications? N -  Managing your Finances? N -  Housekeeping or managing your Housekeeping? N -  Some recent data might be hidden  Patient Care Team: [name], NP as PCP - General (Geriatric Medicine)   Assessment:   This is a routine wellness examination for Jarrius.  Exercise Activities and Dietary recommendations Current Exercise Habits: Home exercise routine, Type of exercise: strength training/weights;calisthenics, Time (Minutes): 15, Frequency (Times/Week): 7, Weekly Exercise (Minutes/Week): 105, Intensity: Mild  Goals    . Increase physical activity     Increase physical activity to 30 mins daily       Fall Risk Fall Risk  07/02/2019 04/24/2018 02/16/2016 09/17/2012  Falls in the past year? 0 0 No No  Comment - Emmi Telephone Survey: data to providers prior to load - -  Number falls in past yr: 0 - - -  Injury with Fall? 0 - - -   Is the patient's home free of loose throw rugs in walkways, pet beds, electrical cords, etc?   yes      Grab bars in the bathroom? yes      Handrails on the stairs?   yes      Adequate lighting?   yes  Timed Get Up and Go Performed: na  Depression Screen PHQ 2/9 Scores 07/02/2019 09/17/2012  PHQ - 2 Score 0 0    Cognitive Function MMSE - Mini Mental State Exam 02/16/2016  Orientation to time 5  Orientation to Place 5  Registration 3  Attention/ Calculation 5  Recall 3  Language- name 2 objects 2  Language- repeat 1  Language- follow 3 step command  3  Language- read & follow direction 1  Write a sentence 1  Copy design 1  Total score 30     6CIT Screen 07/02/2019  What Year? 0 points  What month? 0 points  What time? 0 points  Count back from 20 0 points  Months in reverse 0 points  Repeat phrase 4 points  Total Score 4    There is no immunization history for the selected administration types on file for this patient.  Qualifies for Shingles Vaccine? Yes, recommended  Screening Tests Health Maintenance  Topic Date Due  . Hepatitis C Screening  01-Apr-1946  . INFLUENZA VACCINE  09/03/2019 (Originally 01/04/2019)  . PNA vac Low Risk Adult (1 of 2 - PCV13) 07/01/2020 (Originally 03/07/2011)  . COLONOSCOPY  02/15/2026 (Originally 03/06/1996)  . TETANUS/TDAP  02/15/2026 (Originally 03/06/1965)   Cancer Screenings: Lung: Low Dose CT Chest recommended if Age 68-80 years, 30 pack-year currently smoking OR have quit w/in 15years. Patient does not qualify. Colorectal: overdue  Additional Screenings:  Hepatitis C Screening: recommended, he declines blood draws      Plan:     I have personally reviewed and noted the following in the patient's chart:   . Medical and social history . Use of alcohol, tobacco or illicit drugs  . Current medications and supplements . Functional ability and status . Nutritional status . Physical activity . Advanced directives . List of other physicians . Hospitalizations, surgeries, and ER visits in previous 12 months . Vitals . Screenings to include cognitive, depression, and falls . Referrals and appointments  In addition, I have reviewed and discussed with patient certain preventive protocols, quality metrics, and best practice recommendations. A written personalized care plan for preventive services as well as general preventive health recommendations were provided to patient.     [name], NP  07/02/2019

## 2019-07-02 NOTE — Progress Notes (Signed)
Careteam: Patient Care Team: [name], NP as PCP - General (Geriatric Medicine)  Advanced Directive information Does Patient Have a Medical Advance Directive?: No, Does patient want to make changes to medical advance directive?: Yes (MAU/Ambulatory/Procedural Areas - Information given)  No Known Allergies  Chief Complaint  Patient presents with  . Medical Management of Chronic Issues    Extended visit      HPI: Patient is a 74 y.o. [name] for routine follow up. Former pt of Dr Eulas Post but has missed several appts/follow ups.   Recently hospitalized for blood in urine- recommended for urologist follow up Does not have follow up scheduled but reports he plans to follow up.  Denies any further blood in urine.  Under went urethral dilatation with Foley catheter insertion and bladder irrigation Then foley failed to drain and found to have large hematoma in the bladder with massive prostate. Underwent clot evacuation on 12/22  Was fount to have a hgb of 4 on admission, transfused a total of 6 units PRBC during hospitalization. He reports he is not taking his iron supplement and eating foods rich in iron.   BPH- continues on finasteride, denies any urinary problems at this time.  AKI noted on admission to hospital, baseline was around 0.8-1 and Cr 1.04 on discharge.   Hypokalemia noted during hospitalization, potassium was replaced.   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 Several street fights and hit while playing football. Also has glaucoma   Ran out of hctz and now leg swelling is worse. Does add salt to food.   Review of Systems:  Review of Systems  Constitutional: Negative for chills, fever and weight loss.  HENT: Negative for hearing loss.   Eyes:       Legally blind  Respiratory: Negative for cough, sputum production and shortness of breath.   Cardiovascular: Positive for leg swelling. Negative for chest pain and palpitations.    Gastrointestinal: Negative for abdominal pain, constipation, diarrhea and heartburn.  Genitourinary: Negative for dysuria, frequency, hematuria and urgency.  Musculoskeletal: Negative for back pain, falls, joint pain and myalgias.  Skin: Negative.   Neurological: Negative for dizziness and headaches.  Psychiatric/Behavioral: Negative for depression and memory loss. The patient does not have insomnia.     Past Medical History:  Diagnosis Date  . Blind   . Disorder of bone and cartilage, unspecified   . Edema   . Elevated prostate specific antigen (PSA)   . Hypertension   . Hypopotassemia   . Nonspecific abnormal results of liver function study   . Unspecified glaucoma(365.9)    Past Surgical History:  Procedure Laterality Date  . CYSTOSCOPY WITH FULGERATION N/A 01/17/2018   Procedure: CYSTOSCOPY CLOT EVACUATION WITH FULGERATION TUR BIOPSY OF BLADDER NECK;  Surgeon: Irine Seal, MD;  Location: [facility];  Service: Urology;  Laterality: N/A;  . EYE SURGERY Bilateral 2009   for glaucoma  . EYE SURGERY Left 2014  . HERNIA REPAIR Right 2003   Dr. Jeraldine Loots  . LAPAROSCOPIC APPENDECTOMY N/A 12/24/2012   Procedure:  LAPAROSCOPIC APPENDECTOMY ;  Surgeon: Pedro Earls, MD;  Location: [facility];  Service: General;  Laterality: N/A;  . TRANSURETHRAL RESECTION OF PROSTATE N/A 05/27/2019   Procedure: Cystoscopy Clot Evacuation and Fulgration;  Surgeon: Ardis Hughs, MD;  Location: [facility];  Service: Urology;  Laterality: N/A;   Social History:   reports that he quit smoking about 51 years ago. His smoking use included cigarettes. He has  never used smokeless tobacco. He reports that he does not drink alcohol or use drugs.  Family History  Problem Relation Age of Onset  . Kidney failure Mother     Medications: Patient's Medications  New Prescriptions   No medications on file  Previous Medications   BRIMONIDINE (ALPHAGAN) 0.2 % OPHTHALMIC SOLUTION    Place 1 drop into both eyes 2  (two) times daily.   CYANOCOBALAMIN 500 MCG TABLET    Take 1,000 mcg by mouth every morning.    DORZOLAMIDE-TIMOLOL (COSOPT) 22.3-6.8 MG/ML OPHTHALMIC SOLUTION    APPOINTMENT OVERDUE, instill 1 drop in both eyes twice daily   FOLIC ACID (FOLVITE) Q000111Q MCG TABLET    Take 400 mcg by mouth daily in the afternoon.    HYDROCHLOROTHIAZIDE (MICROZIDE) 12.5 MG CAPSULE    Take 12.5 mg by mouth daily.   MAGNESIUM 250 MG TABS    Take 250 mg by mouth as needed (for headaches).    PILOCARPINE (PILOCAR) 2 % OPHTHALMIC SOLUTION    Place 1 drop into the right eye 4 (four) times daily.    POTASSIUM CHLORIDE SA (K-DUR,KLOR-CON) 20 MEQ TABLET    Take 1 tablet (20 mEq total) by mouth daily.   PYRIDOXINE (VITAMIN B-6) 100 MG TABLET    Take 100 mg by mouth every morning.   VITAMIN C (ASCORBIC ACID) 500 MG TABLET    Take 1,000 mg by mouth every morning.   Modified Medications   No medications on file  Discontinued Medications   No medications on file    Physical Exam:  Vitals:   07/02/19 1347  BP: 136/78  Pulse: 85  Temp: (!) 97.1 F (36.2 C)  TempSrc: Temporal  SpO2: 99%  Weight: 199 lb (90.3 kg)  Height: 5\' 7"  (1.702 m)   Body mass index is 31.17 kg/m. Wt Readings from Last 3 Encounters:  07/02/19 199 lb (90.3 kg)  07/02/19 199 lb (90.3 kg)  05/27/19 198 lb 6.6 oz (90 kg)    Physical Exam Constitutional:      General: He is not in acute distress.    Appearance: He is well-developed. He is not diaphoretic.  HENT:     Head: Normocephalic and atraumatic.     Mouth/Throat:     Pharynx: No oropharyngeal exudate.  Eyes:     Conjunctiva/sclera: Conjunctivae normal.     Pupils: Pupils are equal, round, and reactive to light.  Cardiovascular:     Rate and Rhythm: Normal rate and regular rhythm.     Heart sounds: Normal heart sounds.  Pulmonary:     Effort: Pulmonary effort is normal.     Breath sounds: Normal breath sounds.  Abdominal:     General: Bowel sounds are normal.     Palpations:  Abdomen is soft.  Musculoskeletal:        General: No tenderness.     Cervical back: Normal range of motion and neck supple.     Right lower leg: Edema present.     Left lower leg: Edema present.     Comments: 2+ edema bilaterally with scaly skin nontender  Skin:    General: Skin is warm and dry.  Neurological:     Mental Status: He is alert and oriented to person, place, and time.  Psychiatric:        Mood and Affect: Mood normal.        Behavior: Behavior normal.     Labs reviewed: Basic Metabolic Panel: Recent Labs    05/25/19  0207 05/25/19 0655 05/26/19 0721 05/27/19 0235 05/28/19 0725  NA   < >  --  140 142 139  K   < >  --  3.9 3.8 4.0  CL   < >  --  109 109 110  CO2   < >  --  21* 21* 21*  GLUCOSE   < >  --  127* 133* 134*  BUN   < >  --  16 19 20   CREATININE   < >  --  1.23 1.55* 1.04  CALCIUM   < >  --  7.6* 7.7* 7.8*  MG  --  2.1  --   --   --    < > = values in this interval not displayed.   Liver Function Tests: Recent Labs    05/25/19 0207  AST 24  ALT 20  ALKPHOS 49  BILITOT 1.0  PROT 6.2*  ALBUMIN 3.1*   No results for input(s): LIPASE, AMYLASE in the last 8760 hours. No results for input(s): AMMONIA in the last 8760 hours. CBC: Recent Labs    05/27/19 0235 05/27/19 0235 05/27/19 0954 05/27/19 1944 05/28/19 0725  WBC 13.2*  --   --  7.6 10.0  HGB 6.9*   < > 6.1* 7.4* 8.7*  HCT 20.6*   < > 18.5* 23.1* 27.0*  MCV 85.8  --   --  92.8 88.2  PLT 205  --   --  181 199   < > = values in this interval not displayed.   Lipid Panel: No results for input(s): CHOL, HDL, LDLCALC, TRIG, CHOLHDL, LDLDIRECT in the last 8760 hours. TSH: No results for input(s): TSH in the last 8760 hours. A1C: No results found for: HGBA1C   Assessment/Plan 1. Essential hypertension -controlled at this time, DASH diet encouraged - Lipid Panel  2. Benign prostatic hyperplasia with urinary retention -denies any trouble with urination after hospitalization.  Needs follow up with urologist but has not made yet at this time. Number given and told to follo wup - PSA  3. Acute blood loss anemia -iron was stopped when he left hospital. He is eating foods high in iron. - CBC with Differential/Platelet - Iron, TIBC and Ferritin Panel  4. AKI (acute kidney injury) ([place]) -improved on discharge. Continues to try to stay hydrated. Will follow up labs  - COMPLETE METABOLIC PANEL WITH GFR  5. Need for hepatitis C screening test - Hepatitis C antibody  6. Elevated PSA -reports he has not had follow up with urologist, elevated PSA since 2014, number given to make appt with urologist  - PSA  7. Edema, unspecified type Worse due to running out of HCTZ, will follow up labs due to recent hospitalization and then refill pending lab work. Encouraged elevation of LE, compression hose and no added salt with low sodium diet.   8. 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 [place], [facility] [phone]

## 2019-07-02 NOTE — Patient Instructions (Signed)
To prop legs up above the level of the heart when sitting No added salt and limit salty foods To get compression hose at medical supply store-place on legs in the morning and remove at bedtime.     CALL UROLOGIST for follow up appt.

## 2019-07-02 NOTE — Addendum Note (Signed)
Addended by: Logan Bores on: 07/02/2019 02:40 PM   Modules accepted: Orders

## 2019-07-02 NOTE — Patient Instructions (Signed)
[name] [name] , Thank you for taking time to come for your Medicare Wellness Visit. I appreciate your ongoing commitment to your health goals. Please review the following plan we discussed and let me know if I can assist you in the future.   Screening recommendations/referrals: Colonoscopy - colorectal screening recommended - recommend to do cologuard  Recommended yearly ophthalmology/optometry visit for glaucoma screening and checkup Recommended yearly dental visit for hygiene and checkup  Vaccinations: Influenza vaccine declines  Pneumococcal vaccine: GIVEN TODAY Tdap vaccine DUE- recommend to get at the pharmacy Shingles vaccine RECOMMENDED- to get at the pharmacy COVID- COVID-19 Vaccine Information can be found at: ShippingScam.co.uk For questions related to vaccine distribution or appointments, please email vaccine@[facility] .com or call [phone].    Advanced directives: to complete and bring back to office once done.  Conditions/risks identified: fall risk due to visual deficit make sure to move all things from walkways  Next appointment: 1 year  Preventive Care 19 Years and Older, Male Preventive care refers to lifestyle choices and visits with your health care provider that can promote health and wellness. What does preventive care include?  A yearly physical exam. This is also called an annual well check.  Dental exams once or twice a year.  Routine eye exams. Ask your health care provider how often you should have your eyes checked.  Personal lifestyle choices, including:  Daily care of your teeth and gums.  Regular physical activity.  Eating a healthy diet.  Avoiding tobacco and drug use.  Limiting alcohol use.  Practicing safe sex.  Taking low doses of aspirin every day.  Taking vitamin and mineral supplements as recommended by your health care provider. What happens during an annual well  check? The services and screenings done by your health care provider during your annual well check will depend on your age, overall health, lifestyle risk factors, and family history of disease. Counseling  Your health care provider may ask you questions about your:  Alcohol use.  Tobacco use.  Drug use.  Emotional well-being.  Home and relationship well-being.  Sexual activity.  Eating habits.  History of falls.  Memory and ability to understand (cognition).  Work and work Statistician. Screening  You may have the following tests or measurements:  Height, weight, and BMI.  Blood pressure.  Lipid and cholesterol levels. These may be checked every 5 years, or more frequently if you are over 49 years old.  Skin check.  Lung cancer screening. You may have this screening every year starting at age 67 if you have a 30-pack-year history of smoking and currently smoke or have quit within the past 15 years.  Fecal occult blood test (FOBT) of the stool. You may have this test every year starting at age 81.  Flexible sigmoidoscopy or colonoscopy. You may have a sigmoidoscopy every 5 years or a colonoscopy every 10 years starting at age 67.  Prostate cancer screening. Recommendations will vary depending on your family history and other risks.  Hepatitis C blood test.  Hepatitis B blood test.  Sexually transmitted disease (STD) testing.  Diabetes screening. This is done by checking your blood sugar (glucose) after you have not eaten for a while (fasting). You may have this done every 1-3 years.  Abdominal aortic aneurysm (AAA) screening. You may need this if you are a current or former smoker.  Osteoporosis. You may be screened starting at age 21 if you are at high risk. Talk with your health care provider about your test  results, treatment options, and if necessary, the need for more tests. Vaccines  Your health care provider may recommend certain vaccines, such  as:  Influenza vaccine. This is recommended every year.  Tetanus, diphtheria, and acellular pertussis (Tdap, Td) vaccine. You may need a Td booster every 10 years.  Zoster vaccine. You may need this after age 13.  Pneumococcal 13-valent conjugate (PCV13) vaccine. One dose is recommended after age 35.  Pneumococcal polysaccharide (PPSV23) vaccine. One dose is recommended after age 47. Talk to your health care provider about which screenings and vaccines you need and how often you need them. This information is not intended to replace advice given to you by your health care provider. Make sure you discuss any questions you have with your health care provider. Document Released: 06/18/2015 Document Revised: 02/09/2016 Document Reviewed: 03/23/2015 Elsevier Interactive Patient Education  2017 [place] Prevention in the Home Falls can cause injuries. They can happen to people of all ages. There are many things you can do to make your home safe and to help prevent falls. What can I do on the outside of my home?  Regularly fix the edges of walkways and driveways and fix any cracks.  Remove anything that might make you trip as you walk through a door, such as a raised step or threshold.  Trim any bushes or trees on the path to your home.  Use bright outdoor lighting.  Clear any walking paths of anything that might make someone trip, such as rocks or tools.  Regularly check to see if handrails are loose or broken. Make sure that both sides of any steps have handrails.  Any raised decks and porches should have guardrails on the edges.  Have any leaves, snow, or ice cleared regularly.  Use sand or salt on walking paths during winter.  Clean up any spills in your garage right away. This includes oil or grease spills. What can I do in the bathroom?  Use night lights.  Install grab bars by the toilet and in the tub and shower. Do not use towel bars as grab bars.  Use  non-skid mats or decals in the tub or shower.  If you need to sit down in the shower, use a plastic, non-slip stool.  Keep the floor dry. Clean up any water that spills on the floor as soon as it happens.  Remove soap buildup in the tub or shower regularly.  Attach bath mats securely with double-sided non-slip rug tape.  Do not have throw rugs and other things on the floor that can make you trip. What can I do in the bedroom?  Use night lights.  Make sure that you have a light by your bed that is easy to reach.  Do not use any sheets or blankets that are too big for your bed. They should not hang down onto the floor.  Have a firm chair that has side arms. You can use this for support while you get dressed.  Do not have throw rugs and other things on the floor that can make you trip. What can I do in the kitchen?  Clean up any spills right away.  Avoid walking on wet floors.  Keep items that you use a lot in easy-to-reach places.  If you need to reach something above you, use a strong step stool that has a grab bar.  Keep electrical cords out of the way.  Do not use floor polish or wax that makes  floors slippery. If you must use wax, use non-skid floor wax.  Do not have throw rugs and other things on the floor that can make you trip. What can I do with my stairs?  Do not leave any items on the stairs.  Make sure that there are handrails on both sides of the stairs and use them. Fix handrails that are broken or loose. Make sure that handrails are as long as the stairways.  Check any carpeting to make sure that it is firmly attached to the stairs. Fix any carpet that is loose or worn.  Avoid having throw rugs at the top or bottom of the stairs. If you do have throw rugs, attach them to the floor with carpet tape.  Make sure that you have a light switch at the top of the stairs and the bottom of the stairs. If you do not have them, ask someone to add them for you. What  else can I do to help prevent falls?  Wear shoes that:  Do not have high heels.  Have rubber bottoms.  Are comfortable and fit you well.  Are closed at the toe. Do not wear sandals.  If you use a stepladder:  Make sure that it is fully opened. Do not climb a closed stepladder.  Make sure that both sides of the stepladder are locked into place.  Ask someone to hold it for you, if possible.  Clearly mark and make sure that you can see:  Any grab bars or handrails.  First and last steps.  Where the edge of each step is.  Use tools that help you move around (mobility aids) if they are needed. These include:  Canes.  Walkers.  Scooters.  Crutches.  Turn on the lights when you go into a dark area. Replace any light bulbs as soon as they burn out.  Set up your furniture so you have a clear path. Avoid moving your furniture around.  If any of your floors are uneven, fix them.  If there are any pets around you, be aware of where they are.  Review your medicines with your doctor. Some medicines can make you feel dizzy. This can increase your chance of falling. Ask your doctor what other things that you can do to help prevent falls. This information is not intended to replace advice given to you by your health care provider. Make sure you discuss any questions you have with your health care provider. Document Released: 03/18/2009 Document Revised: 10/28/2015 Document Reviewed: 06/26/2014 Elsevier Interactive Patient Education  2017 [employer].

## 2019-07-03 ENCOUNTER — Other Ambulatory Visit: Payer: Self-pay | Admitting: Nurse Practitioner

## 2019-07-03 DIAGNOSIS — R768 Other specified abnormal immunological findings in serum: Secondary | ICD-10-CM

## 2019-07-03 MED ORDER — POTASSIUM CHLORIDE CRYS ER 20 MEQ PO TBCR: 20.0000 meq | EXTENDED_RELEASE_TABLET | Freq: Every day | ORAL | 1 refills | Status: DC

## 2019-07-03 MED ORDER — HYDROCHLOROTHIAZIDE 12.5 MG PO CAPS: 12.5000 mg | ORAL_CAPSULE | Freq: Every day | ORAL | 1 refills | Status: DC

## 2019-07-03 NOTE — Telephone Encounter (Signed)
He has a very low hgb and he just needs to be aware of the severity of this. He need at least 1 if not 2 blood transfusions. His brain and heart need hgb to carry oxygen to it and if not corrected to result in death. He is at high risk for complications related to low blood counts. It is ultimately his decision but needs to be aware of the consequence if he does not go. Also needs to investigate where he is losing blood from and the hospital is the place this is done.

## 2019-07-03 NOTE — Telephone Encounter (Signed)
Called patient, number listed is an invalid number. I called patient's emergency contact, brother Mortimer Fries and Mortimer Fries gave me a contact number on [phone].  I called the number provided by [facility], no answer and voicemail not set up.   I sent patient a text message from the office Iphone requesting a return call since I was unable to leave a message and due to the urgency of this message.  I will try to call again later.

## 2019-07-03 NOTE — Telephone Encounter (Signed)
Patient walked into the office and I shared [name]'s additional response. Patient states nothing has changed for our conversation earlier. Patient is still refusing to go to ER.  The reason patient stopped by the office was to express his dissatisfaction with Janett Billow not sending in his rx's to the pharmacy yesterday as he requested and she agreed to. Patient had to pay taxi fees to go to pharmacy and rx was not there. Patient then had to pay taxi fees to come up here. Patient states he feels like he was mistreated and done wrong. Patient states " I should be reimbursed."    I apologized for his inconveniences and sent rx's to [facility] as requested. Patient asked for a print off documenting rx sent to pharmacy. I provided documentation as requested. Patient was escorted back to the waiting area where his Taxi driver took over escorting him to the car.

## 2019-07-03 NOTE — Telephone Encounter (Signed)
I tried to call patient again on number provided by his brother, no answer, and no voicemail setup.

## 2019-07-03 NOTE — Telephone Encounter (Signed)
Thanks for the update.I agree with Janett Billow.

## 2019-07-03 NOTE — Telephone Encounter (Signed)
I discussed with pt that I was going to hold off on sending medication until we got the lab results back because the hospital held some of his medication due to worsening renal function. He expressed understanding at the time of the office visit.

## 2019-07-03 NOTE — Telephone Encounter (Signed)
Noted, thank you

## 2019-07-03 NOTE — Telephone Encounter (Signed)
Incoming call received from patient.  I spoke with patient and discussed Dinah's response. Patient states I feel fine, I just finished eating breakfast.  Patient states I am not going to the emergency room. Patient states they sign you in and you can not get out. Patient states it is a lot of money for me to sit there several days. Patient states that can be 3000 dollars or more. Patient does not want the emergency room to have power over him. Patient states the last time he was there they put the wrong eye drops in his eyes and vision was screwed up as a results. Patient feels like the staff in the hospital does not listen to him. Patient is frightened to go to the ER and reiterated he is not going.

## 2019-07-08 LAB — COMPLETE METABOLIC PANEL WITH GFR
AG Ratio: 1.9 (calc) (ref 1.0–2.5)
ALT: 22 U/L (ref 9–46)
AST: 20 U/L (ref 10–35)
Albumin: 4.7 g/dL (ref 3.6–5.1)
Alkaline phosphatase (APISO): 51 U/L (ref 35–144)
BUN: 15 mg/dL (ref 7–25)
CO2: 23 mmol/L (ref 20–32)
Calcium: 9.8 mg/dL (ref 8.6–10.3)
Chloride: 100 mmol/L (ref 98–110)
Creat: 0.86 mg/dL (ref 0.70–1.18)
GFR, Est African American: 100 mL/min/{1.73_m2} (ref >=60)
GFR, Est Non African American: 86 mL/min/{1.73_m2} (ref >=60)
Globulin: 2.5 g/dL (calc) (ref 1.9–3.7)
Glucose, Bld: 109 mg/dL (ref 65–139)
Potassium: 4.1 mmol/L (ref 3.5–5.3)
Sodium: 137 mmol/L (ref 135–146)
Total Bilirubin: 0.4 mg/dL (ref 0.2–1.2)
Total Protein: 7.2 g/dL (ref 6.1–8.1)

## 2019-07-08 LAB — CBC WITH DIFFERENTIAL/PLATELET
Absolute Monocytes: 694 cells/uL (ref 200–950)
Basophils Absolute: 62 cells/uL (ref 0–200)
Basophils Relative: 0.7 %
Eosinophils Absolute: 134 cells/uL (ref 15–500)
Eosinophils Relative: 1.5 %
HCT: 19.4 % — ABNORMAL LOW (ref 38.5–50.0)
Hemoglobin: 5.5 g/dL — CL (ref 13.2–17.1)
Lymphs Abs: 1210 cells/uL (ref 850–3900)
MCH: 20.3 pg — ABNORMAL LOW (ref 27.0–33.0)
MCHC: 28.4 g/dL — ABNORMAL LOW (ref 32.0–36.0)
MCV: 71.6 fL — ABNORMAL LOW (ref 80.0–100.0)
Monocytes Relative: 7.8 %
Neutro Abs: 6800 cells/uL (ref 1500–7800)
Neutrophils Relative %: 76.4 %
Platelets: 232 10*3/uL (ref 140–400)
RBC: 2.71 10*6/uL — ABNORMAL LOW (ref 4.20–5.80)
RDW: 22.8 % — ABNORMAL HIGH (ref 11.0–15.0)
Total Lymphocyte: 13.6 %
WBC: 8.9 10*3/uL (ref 3.8–10.8)

## 2019-07-08 LAB — LIPID PANEL
Cholesterol: 191 mg/dL (ref <200)
HDL: 40 mg/dL (ref >=40)
Non-HDL Cholesterol (Calc): 151 mg/dL (calc) — ABNORMAL HIGH (ref <130)
Total CHOL/HDL Ratio: 4.8 (calc) (ref <5.0)
Triglycerides: 514 mg/dL — ABNORMAL HIGH (ref <150)

## 2019-07-08 LAB — HEPATITIS C ANTIBODY
Hepatitis C Ab: REACTIVE — AB
SIGNAL TO CUT-OFF: 26 — ABNORMAL HIGH (ref <1.00)

## 2019-07-08 LAB — IRON,TIBC AND FERRITIN PANEL
%SAT: 27 % (calc) (ref 20–48)
Ferritin: 93 ng/mL (ref 24–380)
Iron: 106 ug/dL (ref 50–180)
TIBC: 386 mcg/dL (calc) (ref 250–425)

## 2019-07-08 LAB — HCV RNA,QUANTITATIVE REAL TIME PCR
HCV Quantitative Log: 5.84 Log IU/mL — ABNORMAL HIGH
HCV RNA, PCR, QN: 687000 IU/mL — ABNORMAL HIGH

## 2019-07-08 LAB — CBC MORPHOLOGY

## 2019-08-23 ENCOUNTER — Encounter (HOSPITAL_COMMUNITY): Payer: Self-pay | Admitting: Emergency Medicine

## 2019-08-23 ENCOUNTER — Other Ambulatory Visit: Payer: Self-pay

## 2019-08-23 ENCOUNTER — Emergency Department (HOSPITAL_COMMUNITY)
Admission: EM | Admit: 2019-08-23 | Discharge: 2019-08-23 | Discharge status: Against Medical Advice | Payer: Medicare Other | Attending: Emergency Medicine | Admitting: Emergency Medicine

## 2019-08-23 DIAGNOSIS — Z87891 Personal history of nicotine dependence: Secondary | ICD-10-CM | POA: Insufficient documentation

## 2019-08-23 DIAGNOSIS — Z79899 Other long term (current) drug therapy: Secondary | ICD-10-CM | POA: Insufficient documentation

## 2019-08-23 DIAGNOSIS — R300 Dysuria: Secondary | ICD-10-CM | POA: Diagnosis present

## 2019-08-23 DIAGNOSIS — D649 Anemia, unspecified: Secondary | ICD-10-CM

## 2019-08-23 DIAGNOSIS — I10 Essential (primary) hypertension: Secondary | ICD-10-CM | POA: Diagnosis not present

## 2019-08-23 DIAGNOSIS — R339 Retention of urine, unspecified: Secondary | ICD-10-CM

## 2019-08-23 LAB — BASIC METABOLIC PANEL
Anion gap: 12 (ref 5–15)
BUN: 18 mg/dL (ref 8–23)
CO2: 24 mmol/L (ref 22–32)
Calcium: 8.6 mg/dL — ABNORMAL LOW (ref 8.9–10.3)
Chloride: 104 mmol/L (ref 98–111)
Creatinine, Ser: 0.95 mg/dL (ref 0.61–1.24)
GFR calc Af Amer: >60 mL/min (ref >60)
GFR calc non Af Amer: >60 mL/min (ref >60)
Glucose, Bld: 107 mg/dL — ABNORMAL HIGH (ref 70–99)
Potassium: 3.1 mmol/L — ABNORMAL LOW (ref 3.5–5.1)
Sodium: 140 mmol/L (ref 135–145)

## 2019-08-23 LAB — CBC WITH DIFFERENTIAL/PLATELET
Abs Immature Granulocytes: 0.02 10*3/uL (ref 0.00–0.07)
Basophils Absolute: 0.1 10*3/uL (ref 0.0–0.1)
Basophils Relative: 1 %
Eosinophils Absolute: 0.1 10*3/uL (ref 0.0–0.5)
Eosinophils Relative: 1 %
HCT: 26.8 % — ABNORMAL LOW (ref 39.0–52.0)
Hemoglobin: 6.6 g/dL — CL (ref 13.0–17.0)
Immature Granulocytes: 0 %
Lymphocytes Relative: 19 %
Lymphs Abs: 1.7 10*3/uL (ref 0.7–4.0)
MCH: 16.1 pg — ABNORMAL LOW (ref 26.0–34.0)
MCHC: 24.6 g/dL — ABNORMAL LOW (ref 30.0–36.0)
MCV: 65.2 fL — ABNORMAL LOW (ref 80.0–100.0)
Monocytes Absolute: 0.9 10*3/uL (ref 0.1–1.0)
Monocytes Relative: 10 %
Neutro Abs: 6.3 10*3/uL (ref 1.7–7.7)
Neutrophils Relative %: 69 %
Platelets: 430 10*3/uL — ABNORMAL HIGH (ref 150–400)
RBC: 4.11 MIL/uL — ABNORMAL LOW (ref 4.22–5.81)
RDW: 23.2 % — ABNORMAL HIGH (ref 11.5–15.5)
WBC: 9.1 10*3/uL (ref 4.0–10.5)
nRBC: 0 % (ref 0.0–0.2)

## 2019-08-23 LAB — URINALYSIS, ROUTINE W REFLEX MICROSCOPIC
Bilirubin Urine: NEGATIVE
Glucose, UA: NEGATIVE mg/dL
Hgb urine dipstick: NEGATIVE
Ketones, ur: NEGATIVE mg/dL
Nitrite: NEGATIVE
Protein, ur: 30 mg/dL — AB
Specific Gravity, Urine: 1.01 (ref 1.005–1.030)
WBC, UA: >50 WBC/hpf — ABNORMAL HIGH (ref 0–5)
pH: 6 (ref 5.0–8.0)

## 2019-08-23 LAB — POC OCCULT BLOOD, ED: Fecal Occult Bld: NEGATIVE

## 2019-08-23 MED ORDER — POTASSIUM CHLORIDE CRYS ER 20 MEQ PO TBCR: 40.0000 meq | EXTENDED_RELEASE_TABLET | Freq: Once | ORAL | Status: DC

## 2019-08-23 NOTE — ED Triage Notes (Signed)
Pt reports for almost two weeks hasnt had nothing but dribble of urine to come out, not having a stream and having to strain. It is causing his legs to swell.

## 2019-08-23 NOTE — Discharge Instructions (Signed)
You chose to leave today [place] before the urologist was able to place a catheter.  If you are not able to pass any urine at all and you develop lower abdominal pain you should come to the emergency department otherwise you can follow-up with Dr. Jeffie Pollock at the urologist office.  Your hemoglobin was low today but overall improving.  Please continue eating plenty of vegetables, you can also take an iron supplement, please follow-up with your primary care doctor for further evaluation of your anemia

## 2019-08-23 NOTE — ED Notes (Signed)
Tried x4 to place catheter unable. PA notified.

## 2019-08-23 NOTE — ED Provider Notes (Signed)
Medical screening examination/treatment/procedure(s) were conducted as a shared visit with non-physician practitioner(s) and myself.  I personally evaluated the patient during the encounter.  Known prostatomegaly requiring catheters in the past presents emerged from today with couple weeks of urinary retention only able to squeeze out little dribbles at a time.  Basically overflow incontinence.  Not able to get a Foley placed by nursing.  On my examination patient is calm comfortable with normal vital signs.  Pending urology consult for Foley placement.        Merrily Pew, MD 08/23/19 [phone]

## 2019-08-23 NOTE — ED Notes (Signed)
Patient refused blood collection

## 2019-08-23 NOTE — ED Notes (Signed)
Patient attempted to sign him self out AMA however, the equipment in the room would not work.

## 2019-08-23 NOTE — ED Provider Notes (Signed)
[facility] DEPT Provider Note   CSN: [id number] Arrival date & time: 08/23/19  0759     History Chief Complaint  Patient presents with  . Urinary Retention  . Leg Swelling    [name] Rudis. is a 74 y.o. male.  [name] Anik Kowalik. is a 74 y.o. male with history of hypertension, BPH, lower extremity edema, visual impairment, who presents to the emergency department for difficulty with urination.  He states for the last 2 weeks he has only been able to pass urine by dribbling and he is unable to initiate a normal urinary stream.  He reports he is only able to pass a small amount of urine at a time and does not feel like he ever enters his bladder.  He states he was last able to pass a small enough urine for he got here but he had to strain significantly to do so.  He reports he gets some discomfort over his bladder intermediately.  He has had urinary retention previously and had to have a Foley catheter placed, was having hematuria at the time, but has not passed any blood in his urine that he has noticed.  He denies any burning or discomfort with urination aside from the straining. Denies any flank pain. No abdominal pain, no fevers or chills.  Known history of enlarged prostate and is followed by Dr. Jeffie Pollock with urology.  States that because he has not been able to pass his urine normally he has noted some worsening swelling in his legs.  He denies any chest pain or shortness of breath.        Past Medical History:  Diagnosis Date  . Blind   . Disorder of bone and cartilage, unspecified   . Edema   . Elevated prostate specific antigen (PSA)   . Hypertension   . Hypopotassemia   . Nonspecific abnormal results of liver function study   . Unspecified glaucoma(365.9)     Patient Active Problem List   Diagnosis Date Noted  . AKI (acute kidney injury) ([place]) 05/25/2019  . BPH (benign prostatic hyperplasia) 05/25/2019  . Anemia due to blood loss,  chronic   . Acute blood loss anemia 01/14/2018  . Hypokalemia 01/14/2018  . 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 Hematuria 12/23/2012  . Chest pain 12/23/2012  . Anemia due to blood loss, acute 12/23/2012  . Elevated prostate specific antigen (PSA)   . Hypertension   . Edema     Past Surgical History:  Procedure Laterality Date  . CYSTOSCOPY WITH FULGERATION N/A 01/17/2018   Procedure: CYSTOSCOPY CLOT EVACUATION WITH FULGERATION TUR BIOPSY OF BLADDER NECK;  Surgeon: Irine Seal, MD;  Location: [facility];  Service: Urology;  Laterality: N/A;  . EYE SURGERY Bilateral 2009   for glaucoma  . EYE SURGERY Left 2014  . HERNIA REPAIR Right 2003   Dr. Jeraldine Loots  . LAPAROSCOPIC APPENDECTOMY N/A 12/24/2012   Procedure:  LAPAROSCOPIC APPENDECTOMY ;  Surgeon: Pedro Earls, MD;  Location: [facility];  Service: General;  Laterality: N/A;  . TRANSURETHRAL RESECTION OF PROSTATE N/A 05/27/2019   Procedure: Cystoscopy Clot Evacuation and Fulgration;  Surgeon: Ardis Hughs, MD;  Location: [facility];  Service: Urology;  Laterality: N/A;       Family History  Problem Relation Age of Onset  . Kidney failure Mother     Social History   Tobacco Use  . Smoking status: Former Smoker    Types: Cigarettes    Quit date:  06/05/1968    Years since quitting: 51.2  . Smokeless tobacco: Never Used  Substance Use Topics  . Alcohol use: No    Comment: STOPPED IN 1970S  . Drug use: No    Home Medications Prior to Admission medications   Medication Sig Start Date End Date Taking? Authorizing Provider  brimonidine (ALPHAGAN) 0.2 % ophthalmic solution Place 1 drop into both eyes 2 (two) times daily. 03/11/19   [provider]  cyanocobalamin 500 MCG tablet Take 1,000 mcg by mouth every morning.     [provider]  dorzolamide-timolol (COSOPT) 22.3-6.8 MG/ML ophthalmic solution APPOINTMENT OVERDUE, instill 1 drop in both eyes twice daily 07/14/13   [name], NP  folic acid (FOLVITE) Q000111Q  MCG tablet Take 400 mcg by mouth daily in the afternoon.     [provider]  hydrochlorothiazide (MICROZIDE) 12.5 MG capsule Take 1 capsule (12.5 mg total) by mouth daily. 07/03/19   [name], NP  Magnesium 250 MG TABS Take 250 mg by mouth as needed (for headaches).     [provider]  pilocarpine (PILOCAR) 2 % ophthalmic solution Place 1 drop into the right eye 4 (four) times daily.  12/25/17   [provider]  potassium chloride SA (KLOR-CON) 20 MEQ tablet Take 1 tablet (20 mEq total) by mouth daily. 07/03/19   [name], NP  pyridOXINE (VITAMIN B-6) 100 MG tablet Take 100 mg by mouth every morning.    [provider]  vitamin C (ASCORBIC ACID) 500 MG tablet Take 1,000 mg by mouth every morning.     [provider]    Allergies    Patient has no known allergies.  Review of Systems   Review of Systems  Constitutional: Negative for chills and fever.  HENT: Negative.   Respiratory: Negative for cough and shortness of breath.   Cardiovascular: Negative for chest pain.  Gastrointestinal: Negative for abdominal pain, diarrhea, nausea and vomiting.  Genitourinary: Positive for difficulty urinating. Negative for discharge, dysuria, flank pain, frequency, hematuria, penile pain, penile swelling, scrotal swelling and testicular pain.  Musculoskeletal: Negative for arthralgias, back pain and myalgias.  Skin: Negative for color change and rash.  Neurological: Negative for dizziness, syncope and light-headedness.    Physical Exam Updated Vital Signs BP (!) 152/104 (BP Location: Right Arm)   Pulse 93   Temp 98.9 F (37.2 C) (Oral)   Resp 18   SpO2 99%   Physical Exam Vitals and nursing note reviewed.  Constitutional:      General: He is not in acute distress.    Appearance: Normal appearance. He is well-developed and normal weight. He is not ill-appearing or diaphoretic.  HENT:     Head: Normocephalic and atraumatic.  Eyes:       General:        Right eye: No discharge.        Left eye: No discharge.  Cardiovascular:     Rate and Rhythm: Normal rate and regular rhythm.     Heart sounds: Normal heart sounds. No murmur. No friction rub. No gallop.   Pulmonary:     Effort: Pulmonary effort is normal. No respiratory distress.     Breath sounds: Normal breath sounds. No wheezing or rales.     Comments: Respirations equal and unlabored, patient able to speak in full sentences, lungs clear to auscultation bilaterally Abdominal:     General: Bowel sounds are normal. There is no distension.     Palpations:  Abdomen is soft. There is no mass.     Tenderness: There is no abdominal tenderness. There is no guarding.     Comments: Abdomen soft, nondistended, nontender to palpation in all quadrants without guarding or peritoneal signs, no CVA tenderness bilaterally  Genitourinary:    Comments: Penis normal without erythema or discharge, No testicular pain or swelling, palpable distended bladder No gross blood on rectal exam, no hemorrhoids noted Musculoskeletal:        General: No deformity.     Cervical back: Neck supple.     Right lower leg: Edema present.     Left lower leg: Edema present.     Comments: Bilateral lower extremities with 2+ pitting edema   Skin:    General: Skin is warm and dry.     Capillary Refill: Capillary refill takes less than 2 seconds.  Neurological:     Mental Status: He is alert.     Coordination: Coordination normal.     Comments: Speech is clear, able to follow commands Moves extremities without ataxia, coordination intact  Psychiatric:        Mood and Affect: Mood normal.        Behavior: Behavior normal.     ED Results / Procedures / Treatments   Labs (all labs ordered are listed, but only abnormal results are displayed) Labs Reviewed  URINALYSIS, ROUTINE W REFLEX MICROSCOPIC - Abnormal; Notable for the following components:      Result Value   Protein, ur 30 (*)     Leukocytes,Ua LARGE (*)    WBC, UA >50 (*)    Bacteria, UA RARE (*)    All other components within normal limits  BASIC METABOLIC PANEL - Abnormal; Notable for the following components:   Potassium 3.1 (*)    Glucose, Bld 107 (*)    Calcium 8.6 (*)    All other components within normal limits  CBC WITH DIFFERENTIAL/PLATELET - Abnormal; Notable for the following components:   RBC 4.11 (*)    Hemoglobin 6.6 (*)    HCT 26.8 (*)    MCV 65.2 (*)    MCH 16.1 (*)    MCHC 24.6 (*)    RDW 23.2 (*)    Platelets 430 (*)    All other components within normal limits  URINE CULTURE  POC OCCULT BLOOD, ED    EKG None  Radiology No results found.  Procedures Procedures (including critical care time)  Medications Ordered in ED Medications  potassium chloride SA (KLOR-CON) CR tablet 40 mEq (40 mEq Oral Refused 08/23/19 0959)    ED Course  I have reviewed the triage vital signs and the nursing notes.  Pertinent labs & imaging results that were available during my care of the patient were reviewed by me and considered in my medical decision making (see chart for details).    MDM Rules/Calculators/A&P                      74 year old male presents with difficulty urinating, he is only able to dribble out small amounts of urine and this has been ongoing over the past 2 weeks.  He has had some worsening swelling in his leg.  He has not noted any blood in his urine.  On arrival he is well-appearing and in no distress, mildly hypertensive but vitals otherwise normal.  Palpable distended bladder, patient was able to strain to provide a small urine sample, but even after this had for 49 mL noted  in the bladder on scan, we will plan to place Foley catheter and check basic labs and urinalysis.  Notified by nursing staff that they made multiple attempts with a Foley catheter as well as a coud catheter and were unable to pass catheter, patient has known enlarged prostate, will consult  urology.  Patient's hemoglobin is 6.6, this is improved from recent lab work, patient denies blood in the urine is unsure if he has had any blood in the stool, he is not symptomatic with this anemia, does not appear that he is taking iron supplementation.  Will check anemia panel and Hemoccult, but will hold off on blood transfusion at this time, patient states he would like to avoid this as well.  Lab work is otherwise reassuring, normal renal function, potassium of 3.1, p.o. potassium replacement ordered.  Urinalysis with large leukocytes and greater than 50 WBCs but rare bacteria present, no hematuria noted.  Case discussed with Dr. Diona Fanti with urology who will be down to see the patient and attempt to place catheter, request that urology cart be brought to bedside.  Hemoccult is negative,patient refusing additional blood draw for anemia panel, and does not want blood transfusion.  Notified by nursing that patient is becoming impatient and asking to leave, he states that he needs to go pay his electric bill.  He is still waiting for the urologist to come down to place catheter, he has only been able to pass a small amount of urine.  I went to discuss this with the patient, he states that he needs to leave and he has things that he has to get done today.  I discussed potential risks with the patient including bladder injury, worsening kidney function, infection, he expresses understanding of these, has full decision-making capacity but still chooses to leave [place], I sent a message to Dr. Diona Fanti with urology who came down and spoke to the patient briefly but the patient was unwilling to wait for further intervention by urology and chose to leave.  Encouraged to the patient that he could return to the ED at any time if he is not able to pass urine at all otherwise recommended that he follow-up with his urologist in the office.   Final Clinical Impression(s) / ED Diagnoses Final  diagnoses:  Urinary retention  Anemia, unspecified type    Rx / DC Orders ED Discharge Orders    None       Janet Berlin 08/23/19 1136    Mesner, Corene Cornea, MD 08/23/19 1423

## 2019-08-25 LAB — URINE CULTURE: Culture: >=100000 — AB

## 2019-08-26 ENCOUNTER — Telehealth: Payer: Self-pay | Admitting: Emergency Medicine

## 2019-08-26 NOTE — Progress Notes (Signed)
ED Antimicrobial Stewardship Positive Culture Follow Up   [name] [name]. is an 74 y.o. male who presented to [facility] on 08/23/2019 with a chief complaint of  Chief Complaint  Patient presents with  . Urinary Retention  . Leg Swelling    Recent Results (from the past 720 hour(s))  Urine culture     Status: Abnormal   Collection Time: 08/23/19  8:18 AM   Specimen: Urine, Random  Result Value Ref Range Status   Specimen Description   Final    URINE, RANDOM Performed at [facility] [address], [place], [place] [address]    Special Requests   Final    NONE Performed at [facility], [place] [address] [place],  [address]    Culture >=100,000 COLONIES/mL PSEUDOMONAS AERUGINOSA (A)  Final   Report Status 08/25/2019 FINAL  Final   Organism ID, Bacteria PSEUDOMONAS AERUGINOSA (A)  Final      Susceptibility   Pseudomonas aeruginosa - MIC*    CEFTAZIDIME 4 SENSITIVE Sensitive     CIPROFLOXACIN <=0.25 SENSITIVE Sensitive     GENTAMICIN <=1 SENSITIVE Sensitive     IMIPENEM 2 SENSITIVE Sensitive     PIP/TAZO <=4 SENSITIVE Sensitive     CEFEPIME 1 SENSITIVE Sensitive     * >=100,000 COLONIES/mL PSEUDOMONAS AERUGINOSA    []  Treated with , organism resistant to prescribed antimicrobial [x]  Patient discharged originally without antimicrobial agent and treatment is now indicated  New antibiotic prescription:  - Cipro 500 mg PO BID x 5 days   ED Provider: Dr. Valere Dross, PharmD, BCPS 08/26/2019 11:14 AM

## 2019-08-26 NOTE — Telephone Encounter (Signed)
Post ED Visit - Positive Culture Follow-up: Successful Patient Follow-Up  Culture assessed and recommendations reviewed by:  []  Elenor Quinones, Pharm.D. []  Heide Guile, Pharm.D., BCPS AQ-ID []  Parks Neptune, Pharm.D., BCPS []  Alycia Rossetti, Pharm.D., BCPS []  [place], Pharm.D., BCPS, AAHIVP []  Legrand Como, Pharm.D., BCPS, AAHIVP []  Salome Arnt, PharmD, BCPS []  Johnnette Gourd, PharmD, BCPS []  Hughes Better, PharmD, BCPS []  Leeroy Cha, PharmD Burnice Logan PharmD  Positive urine culture  [x]  Patient discharged without antimicrobial prescription and treatment is now indicated []  Organism is resistant to prescribed ED discharge antimicrobial []  Patient with positive blood cultures  Changes discussed with ED provider: David Stall PA New antibiotic prescription start cipro 500mg  po bid x 5 days  Attempting to contact patient   Hazle Nordmann 08/26/2019, 12:01 PM

## 2019-09-12 DIAGNOSIS — N3289 Other specified disorders of bladder: Secondary | ICD-10-CM | POA: Diagnosis not present

## 2019-09-12 DIAGNOSIS — D509 Iron deficiency anemia, unspecified: Secondary | ICD-10-CM | POA: Diagnosis not present

## 2019-09-12 DIAGNOSIS — M6281 Muscle weakness (generalized): Secondary | ICD-10-CM | POA: Diagnosis not present

## 2019-09-12 DIAGNOSIS — Z87891 Personal history of nicotine dependence: Secondary | ICD-10-CM | POA: Diagnosis not present

## 2019-09-12 DIAGNOSIS — I10 Essential (primary) hypertension: Secondary | ICD-10-CM | POA: Diagnosis not present

## 2019-09-12 DIAGNOSIS — R339 Retention of urine, unspecified: Secondary | ICD-10-CM | POA: Diagnosis not present

## 2019-09-12 DIAGNOSIS — Z79899 Other long term (current) drug therapy: Secondary | ICD-10-CM | POA: Diagnosis not present

## 2019-09-12 DIAGNOSIS — N281 Cyst of kidney, acquired: Secondary | ICD-10-CM | POA: Diagnosis not present

## 2019-09-12 DIAGNOSIS — D473 Essential (hemorrhagic) thrombocythemia: Secondary | ICD-10-CM | POA: Diagnosis not present

## 2019-09-12 DIAGNOSIS — E876 Hypokalemia: Secondary | ICD-10-CM | POA: Diagnosis not present

## 2019-09-12 DIAGNOSIS — N138 Other obstructive and reflux uropathy: Secondary | ICD-10-CM | POA: Diagnosis not present

## 2019-09-12 DIAGNOSIS — M7989 Other specified soft tissue disorders: Secondary | ICD-10-CM | POA: Diagnosis not present

## 2019-09-13 DIAGNOSIS — N3289 Other specified disorders of bladder: Secondary | ICD-10-CM | POA: Diagnosis not present

## 2019-09-13 DIAGNOSIS — D473 Essential (hemorrhagic) thrombocythemia: Secondary | ICD-10-CM | POA: Diagnosis not present

## 2019-09-13 DIAGNOSIS — D509 Iron deficiency anemia, unspecified: Secondary | ICD-10-CM | POA: Insufficient documentation

## 2019-09-13 DIAGNOSIS — R338 Other retention of urine: Secondary | ICD-10-CM | POA: Diagnosis not present

## 2019-09-13 DIAGNOSIS — R339 Retention of urine, unspecified: Secondary | ICD-10-CM | POA: Insufficient documentation

## 2019-09-13 DIAGNOSIS — E876 Hypokalemia: Secondary | ICD-10-CM | POA: Diagnosis not present

## 2019-09-13 DIAGNOSIS — N281 Cyst of kidney, acquired: Secondary | ICD-10-CM | POA: Diagnosis not present

## 2019-09-13 DIAGNOSIS — D75839 Thrombocytosis, unspecified: Secondary | ICD-10-CM | POA: Insufficient documentation

## 2019-09-13 DIAGNOSIS — N320 Bladder-neck obstruction: Secondary | ICD-10-CM | POA: Diagnosis not present

## 2019-09-14 DIAGNOSIS — R339 Retention of urine, unspecified: Secondary | ICD-10-CM | POA: Diagnosis not present

## 2019-09-14 DIAGNOSIS — R338 Other retention of urine: Secondary | ICD-10-CM | POA: Diagnosis not present

## 2019-09-14 DIAGNOSIS — R509 Fever, unspecified: Secondary | ICD-10-CM | POA: Diagnosis not present

## 2019-09-14 DIAGNOSIS — N320 Bladder-neck obstruction: Secondary | ICD-10-CM | POA: Diagnosis not present

## 2019-09-15 DIAGNOSIS — R339 Retention of urine, unspecified: Secondary | ICD-10-CM | POA: Diagnosis not present

## 2019-09-15 DIAGNOSIS — R338 Other retention of urine: Secondary | ICD-10-CM | POA: Diagnosis not present

## 2019-09-16 DIAGNOSIS — D509 Iron deficiency anemia, unspecified: Secondary | ICD-10-CM | POA: Diagnosis not present

## 2019-09-16 DIAGNOSIS — D473 Essential (hemorrhagic) thrombocythemia: Secondary | ICD-10-CM | POA: Diagnosis not present

## 2019-09-16 DIAGNOSIS — E876 Hypokalemia: Secondary | ICD-10-CM | POA: Diagnosis not present

## 2019-09-16 DIAGNOSIS — R339 Retention of urine, unspecified: Secondary | ICD-10-CM | POA: Diagnosis not present

## 2019-09-17 DIAGNOSIS — R339 Retention of urine, unspecified: Secondary | ICD-10-CM | POA: Diagnosis not present

## 2019-09-17 DIAGNOSIS — D509 Iron deficiency anemia, unspecified: Secondary | ICD-10-CM | POA: Diagnosis not present

## 2019-09-17 DIAGNOSIS — E876 Hypokalemia: Secondary | ICD-10-CM | POA: Diagnosis not present

## 2019-09-17 DIAGNOSIS — D473 Essential (hemorrhagic) thrombocythemia: Secondary | ICD-10-CM | POA: Diagnosis not present

## 2019-09-18 ENCOUNTER — Telehealth: Payer: Self-pay

## 2019-09-18 NOTE — Telephone Encounter (Signed)
He will have to have an office visit for [place] to be able to sign orders

## 2019-09-18 NOTE — Telephone Encounter (Signed)
[name] [name] (male) was calling from [facility] to confirm that [name] Chandler, NP will sign orders for PT/OT.   Patient was discharged from hospital in [facility] yesterday. Primary diagnosis was urinary retention.  I called patient and he stated he was at currently at a train station heading back to [place]. Scheduled follow-up for Monday 09/22/2019 at 11:30 am  Please advise

## 2019-09-18 NOTE — Telephone Encounter (Signed)
Spoke with [name] [name] and informed her of [name]'s response. [name] [name] aware patient to be seen on Monday 09/22/2019

## 2019-09-22 ENCOUNTER — Encounter: Payer: Self-pay | Admitting: Nurse Practitioner

## 2019-09-22 ENCOUNTER — Ambulatory Visit (INDEPENDENT_AMBULATORY_CARE_PROVIDER_SITE_OTHER): Payer: Medicare Other | Admitting: Nurse Practitioner

## 2019-09-22 ENCOUNTER — Other Ambulatory Visit: Payer: Self-pay

## 2019-09-22 VITALS — BP 128/76 | HR 69 | Temp 96.6°F | Ht 67.0 in | Wt 195.0 lb

## 2019-09-22 DIAGNOSIS — N401 Enlarged prostate with lower urinary tract symptoms: Secondary | ICD-10-CM

## 2019-09-22 DIAGNOSIS — R338 Other retention of urine: Secondary | ICD-10-CM

## 2019-09-22 DIAGNOSIS — N390 Urinary tract infection, site not specified: Secondary | ICD-10-CM | POA: Diagnosis not present

## 2019-09-22 DIAGNOSIS — Z9359 Other cystostomy status: Secondary | ICD-10-CM

## 2019-09-22 DIAGNOSIS — I10 Essential (primary) hypertension: Secondary | ICD-10-CM

## 2019-09-22 DIAGNOSIS — R768 Other specified abnormal immunological findings in serum: Secondary | ICD-10-CM | POA: Diagnosis not present

## 2019-09-22 DIAGNOSIS — D509 Iron deficiency anemia, unspecified: Secondary | ICD-10-CM

## 2019-09-22 NOTE — Progress Notes (Signed)
Careteam: Patient Care Team: [name], NP as PCP - General (Geriatric Medicine)  PLACE OF SERVICE:  [facility] Directive information    No Known Allergies  Chief Complaint  Patient presents with  . Follow-up    ER follow-up on urinary retention  . Medication Management    Requesting rx for pain. Patient with pain in private area. Patient would like gauze in private area maintained.      HPI: Patient is a 74 y.o. male for follow up.  Pt was hospitalized in [place] for 7 days due to urinary retention and UTI.  He went to the ED in [place] after feelings of not being able to empty bladder for 1.5 weeks. 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  Pt was found to have acute urinary retention and unable to pass foley catheter therefore underwent IR guided suprapubic foley catheter placement. Noted to have clear/dark urine but no gross hematuria noted. He was noted to have hgb of 7 and also found to have UTI. He was treatment on cefepime during admission and discharged on ciprofloxacin for 7 days. He also needs home health PT/OT. Pt reports he was weak but now he feels much better.  Slight tenderness around suprapubic site.   Dr Jeffie Pollock is primary urologist in [place]- he is not sure he wants to follow up with him. Reports the urologist in [place] said the size of his prostate was larger than what is really was and therefore does not want to see anyone in town. Agreeable to see urologist in [place] and states he can take the train there.   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  Review of Systems:  Review of Systems  Constitutional: Negative for chills, fever and weight loss.  HENT: Negative for tinnitus.   Respiratory: Negative for cough, sputum production and shortness of breath.   Cardiovascular: Negative for chest pain, palpitations and leg swelling.    Gastrointestinal: Negative for abdominal pain, constipation, diarrhea and heartburn.  Genitourinary: Negative for dysuria, frequency and urgency.       Suprapubic catheter    Musculoskeletal: Negative for back pain, falls, joint pain and myalgias.  Skin: Negative.   Neurological: Positive for weakness (has improved). Negative for dizziness and headaches.    Past Medical History:  Diagnosis Date  . Blind   . Disorder of bone and cartilage, unspecified   . Edema   . Elevated prostate specific antigen (PSA)   . Hypertension   . Hypopotassemia   . Nonspecific abnormal results of liver function study   . Unspecified glaucoma(365.9)    Past Surgical History:  Procedure Laterality Date  . CYSTOSCOPY WITH FULGERATION N/A 01/17/2018   Procedure: CYSTOSCOPY CLOT EVACUATION WITH FULGERATION TUR BIOPSY OF BLADDER NECK;  Surgeon: Irine Seal, MD;  Location: [facility];  Service: Urology;  Laterality: N/A;  . EYE SURGERY Bilateral 2009   for glaucoma  . EYE SURGERY Left 2014  . HERNIA REPAIR Right 2003   Dr. Jeraldine Loots  . LAPAROSCOPIC APPENDECTOMY N/A 12/24/2012   Procedure:  LAPAROSCOPIC APPENDECTOMY ;  Surgeon: Pedro Earls, MD;  Location: [facility];  Service: General;  Laterality: N/A;  . TRANSURETHRAL RESECTION OF PROSTATE N/A 05/27/2019   Procedure: Cystoscopy Clot Evacuation and Fulgration;  Surgeon: Ardis Hughs, MD;  Location: [facility];  Service: Urology;  Laterality: N/A;   Social History:  reports that he quit smoking about 51 years ago. His smoking use included cigarettes. He has never used smokeless tobacco. He reports that he does not drink alcohol or use drugs.  Family History  Problem Relation Age of Onset  . Kidney failure Mother     Medications: Patient's Medications  New Prescriptions   No medications on file  Previous Medications   BRIMONIDINE (ALPHAGAN) 0.2 % OPHTHALMIC SOLUTION    Place 1 drop into both eyes 2 (two) times daily.   CIPROFLOXACIN (CIPRO) 500  MG TABLET    Take 500 mg by mouth 2 (two) times daily.    CYANOCOBALAMIN 500 MCG TABLET    Take 1,000 mcg by mouth every morning.    DORZOLAMIDE-TIMOLOL (COSOPT) 22.3-6.8 MG/ML OPHTHALMIC SOLUTION    APPOINTMENT OVERDUE, instill 1 drop in both eyes twice daily   FOLIC ACID (FOLVITE) Q000111Q MCG TABLET    Take 400 mcg by mouth daily in the afternoon.    HYDROCHLOROTHIAZIDE (MICROZIDE) 12.5 MG CAPSULE    Take 1 capsule (12.5 mg total) by mouth daily.   MAGNESIUM 250 MG TABS    Take 250 mg by mouth as needed (for headaches).    METOPROLOL TARTRATE (LOPRESSOR) 25 MG TABLET    Take 12.5 mg by mouth 2 (two) times daily.   PILOCARPINE (PILOCAR) 2 % OPHTHALMIC SOLUTION    Place 1 drop into the right eye 4 (four) times daily.    POTASSIUM CHLORIDE SA (KLOR-CON) 20 MEQ TABLET    Take 1 tablet (20 mEq total) by mouth daily.   PYRIDOXINE (VITAMIN B-6) 100 MG TABLET    Take 100 mg by mouth every morning.   VITAMIN C (ASCORBIC ACID) 500 MG TABLET    Take 1,000 mg by mouth every morning.   Modified Medications   No medications on file  Discontinued Medications   CIPROFLOXACIN (CIPRO) 250 MG TABLET    Take 250 mg by mouth 2 (two) times daily.    Physical Exam:  Vitals:   09/22/19 1148  BP: 128/76  Pulse: 69  Temp: (!) 96.6 F (35.9 C)  TempSrc: Temporal  SpO2: 99%  Weight: 195 lb (88.5 kg)  Height: 5\' 7"  (1.702 m)   Body mass index is 30.54 kg/m. Wt Readings from Last 3 Encounters:  09/22/19 195 lb (88.5 kg)  07/02/19 199 lb (90.3 kg)  07/02/19 199 lb (90.3 kg)    Physical Exam Constitutional:      General: He is not in acute distress.    Appearance: He is well-developed. He is not diaphoretic.  HENT:     Head: Normocephalic and atraumatic.     Mouth/Throat:     Pharynx: No oropharyngeal exudate.  Eyes:     Conjunctiva/sclera: Conjunctivae normal.     Pupils: Pupils are equal, round, and reactive to light.  Cardiovascular:     Rate and Rhythm: Normal rate and regular rhythm.     Heart  sounds: Normal heart sounds.  Pulmonary:     Effort: Pulmonary effort is normal.     Breath sounds: Normal breath sounds.  Abdominal:     General: Bowel sounds are normal.     Palpations: Abdomen is soft.  Genitourinary:    Comments: Suprapubic cath intact, no redness, drainage or heat noted.  Musculoskeletal:        General: No tenderness.     Cervical back: Normal range of motion and neck supple.  Skin:    General: Skin is warm and dry.  Neurological:  Mental Status: He is alert and oriented to person, place, and time.     Labs reviewed: Basic Metabolic Panel: Recent Labs    05/25/19 0655 05/26/19 0721 05/28/19 0725 07/02/19 1446 08/23/19 0818  NA  --    < > 139 137 140  K  --    < > 4.0 4.1 3.1*  CL  --    < > 110 100 104  CO2  --    < > 21* 23 24  GLUCOSE  --    < > 134* 109 107*  BUN  --    < > 20 15 18   CREATININE  --    < > 1.04 0.86 0.95  CALCIUM  --    < > 7.8* 9.8 8.6*  MG 2.1  --   --   --   --    < > = values in this interval not displayed.   Liver Function Tests: Recent Labs    05/25/19 0207 07/02/19 1446  AST 24 20  ALT 20 22  ALKPHOS 49  --   BILITOT 1.0 0.4  PROT 6.2* 7.2  ALBUMIN 3.1*  --    No results for input(s): LIPASE, AMYLASE in the last 8760 hours. No results for input(s): AMMONIA in the last 8760 hours. CBC: Recent Labs    05/28/19 0725 07/02/19 1446 08/23/19 0818  WBC 10.0 8.9 9.1  NEUTROABS  --  6,800 6.3  HGB 8.7* 5.5* 6.6*  HCT 27.0* 19.4* 26.8*  MCV 88.2 71.6* 65.2*  PLT 199 232 430*   Lipid Panel: Recent Labs    07/02/19 1446  CHOL 191  HDL 40  TRIG 514*  CHOLHDL 4.8   TSH: No results for input(s): TSH in the last 8760 hours. A1C: No results found for: HGBA1C   Assessment/Plan 1. Positive hepatitis C antibody test -noted on labs from January however pt never returned phone call for lab results. ID consult was placed at that time but never followed up on referral. He has now been informed of results  and agreeable for consultation. - Ambulatory referral to Infectious Disease  2. Benign prostatic hyperplasia with urinary retention -now with suprapubic catheter. Home health consulted. PT/OT and nursing for education. -he is aware he needs to be following with urologist but would like to go to urologist in [place] at this time.  - Ambulatory referral to Urology  3. Urinary tract infection without hematuria, site unspecified -to complete Cipro 500 mg by mouth twice daily as prescribed. Will follow up cbc to ensure wbc trending down and follow up renal function. - CBC with Differential/Platelet - COMPLETE METABOLIC PANEL WITH GFR  4. Suprapubic catheter ([place]) -noted today. Home health nursing orders placed for ongoing care and educated. Needs to follow up with urologist. Referral placed. Can use tylenol 500 mg 1-2 tablets every 8 hours as needed for soreness at insertion site.   5. Essential hypertension -controlled on current regimen.   6. Iron deficiency anemia, unspecified iron deficiency anemia type -s/p transfusion during recent hospitalization. Will follow up cbc.  Next appt: 3 months Toini Failla [address], [facility] [phone]

## 2019-09-22 NOTE — Patient Instructions (Signed)
To use tylenol 500 mg 1-2 tablets every 8 hours as needed for pain  Infectious disease referral , Home health referral and urology referral placed  Make sure to follow up with urologist and go to infection disease appt  Follow up in 3 month, sooner if needed

## 2019-09-23 ENCOUNTER — Telehealth: Payer: Self-pay

## 2019-09-23 LAB — CBC WITH DIFFERENTIAL/PLATELET
Absolute Monocytes: 508 cells/uL (ref 200–950)
Basophils Absolute: 73 cells/uL (ref 0–200)
Basophils Relative: 1.1 %
Eosinophils Absolute: 304 cells/uL (ref 15–500)
Eosinophils Relative: 4.6 %
HCT: 37.7 % — ABNORMAL LOW (ref 38.5–50.0)
Hemoglobin: 10.6 g/dL — ABNORMAL LOW (ref 13.2–17.1)
Lymphs Abs: 1162 cells/uL (ref 850–3900)
MCH: 19.6 pg — ABNORMAL LOW (ref 27.0–33.0)
MCHC: 28.1 g/dL — ABNORMAL LOW (ref 32.0–36.0)
MCV: 69.6 fL — ABNORMAL LOW (ref 80.0–100.0)
Monocytes Relative: 7.7 %
Neutro Abs: 4554 cells/uL (ref 1500–7800)
Neutrophils Relative %: 69 %
Platelets: 330 10*3/uL (ref 140–400)
RBC: 5.42 10*6/uL (ref 4.20–5.80)
RDW: 29 % — ABNORMAL HIGH (ref 11.0–15.0)
Total Lymphocyte: 17.6 %
WBC: 6.6 10*3/uL (ref 3.8–10.8)

## 2019-09-23 LAB — COMPLETE METABOLIC PANEL WITH GFR
AG Ratio: 1 (calc) (ref 1.0–2.5)
ALT: 30 U/L (ref 9–46)
AST: 42 U/L — ABNORMAL HIGH (ref 10–35)
Albumin: 3.7 g/dL (ref 3.6–5.1)
Alkaline phosphatase (APISO): 109 U/L (ref 35–144)
BUN: 17 mg/dL (ref 7–25)
CO2: 23 mmol/L (ref 20–32)
Calcium: 9 mg/dL (ref 8.6–10.3)
Chloride: 103 mmol/L (ref 98–110)
Creat: 0.8 mg/dL (ref 0.70–1.18)
GFR, Est African American: 103 mL/min/{1.73_m2} (ref >=60)
GFR, Est Non African American: 89 mL/min/{1.73_m2} (ref >=60)
Globulin: 3.6 g/dL (calc) (ref 1.9–3.7)
Glucose, Bld: 75 mg/dL (ref 65–139)
Potassium: 4.1 mmol/L (ref 3.5–5.3)
Sodium: 137 mmol/L (ref 135–146)
Total Bilirubin: 0.5 mg/dL (ref 0.2–1.2)
Total Protein: 7.3 g/dL (ref 6.1–8.1)

## 2019-09-23 NOTE — Telephone Encounter (Addendum)
Incoming call received from [place] with infectious disease stating that a third referral was placed on this patient.  Joycelyn Schmid expressed that this patient never answers his phone and you can not leave a message due to no voicemail setup.  Joycelyn Schmid recalled trying on 6 different occasions to contact patient with no success with last referral.  Joycelyn Schmid states best practice would be to have the referral coordinator contact them when patient is in office or give patient their number to call and set up appointment. I agreed that we should've called to set up appointment while patient was in office yesterday for he is a hard patient to contact by phone.  I will try to call patient and provider him with the [phone] number to have him take accountability for setting up his appointment.

## 2019-09-23 NOTE — Telephone Encounter (Signed)
Letter mailed to patient with contact information to call Infectious Disease.

## 2019-09-23 NOTE — Telephone Encounter (Signed)
Yes and he has been advised to make appt.

## 2019-09-23 NOTE — Telephone Encounter (Signed)
Called patient, no answer, and no voicemail set up. I will try to call patient again later.    [name] [name] please advise if you think it is appropriate to mail the patient a letter with the contact information for Infectious disease department.

## 2019-09-24 NOTE — Telephone Encounter (Signed)
Called patient, no answer, and no voicemail set up. I will try to call patient again later.

## 2019-09-25 NOTE — Telephone Encounter (Signed)
Called patient, no answer, and no voicemail set up. This was my third attempt to contact patient with no success. A letter was already mailed concerning the contents of this encounter.

## 2019-09-26 ENCOUNTER — Emergency Department (HOSPITAL_COMMUNITY): Payer: Medicare Other

## 2019-09-26 ENCOUNTER — Encounter (HOSPITAL_COMMUNITY): Payer: Self-pay | Admitting: Pediatrics

## 2019-09-26 ENCOUNTER — Other Ambulatory Visit: Payer: Self-pay

## 2019-09-26 ENCOUNTER — Emergency Department (HOSPITAL_COMMUNITY)
Admission: EM | Admit: 2019-09-26 | Discharge: 2019-09-26 | Disposition: A | Discharge status: Home / Self Care | Payer: Medicare Other | Attending: Emergency Medicine | Admitting: Emergency Medicine

## 2019-09-26 DIAGNOSIS — T83198A Other mechanical complication of other urinary devices and implants, initial encounter: Secondary | ICD-10-CM | POA: Diagnosis not present

## 2019-09-26 DIAGNOSIS — T83010A Breakdown (mechanical) of cystostomy catheter, initial encounter: Secondary | ICD-10-CM | POA: Diagnosis not present

## 2019-09-26 DIAGNOSIS — Y829 Unspecified medical devices associated with adverse incidents: Secondary | ICD-10-CM | POA: Diagnosis not present

## 2019-09-26 DIAGNOSIS — Z87891 Personal history of nicotine dependence: Secondary | ICD-10-CM | POA: Diagnosis not present

## 2019-09-26 DIAGNOSIS — R339 Retention of urine, unspecified: Secondary | ICD-10-CM | POA: Insufficient documentation

## 2019-09-26 DIAGNOSIS — N320 Bladder-neck obstruction: Secondary | ICD-10-CM | POA: Diagnosis not present

## 2019-09-26 DIAGNOSIS — I10 Essential (primary) hypertension: Secondary | ICD-10-CM | POA: Diagnosis not present

## 2019-09-26 DIAGNOSIS — Z79899 Other long term (current) drug therapy: Secondary | ICD-10-CM | POA: Insufficient documentation

## 2019-09-26 MED ORDER — MIDAZOLAM HCL 2 MG/2ML IJ SOLN
INTRAMUSCULAR | Status: AC | PRN
  Administered 2019-09-26: 1 mg via INTRAVENOUS

## 2019-09-26 MED ORDER — MIDAZOLAM HCL 2 MG/2ML IJ SOLN
INTRAMUSCULAR | Status: AC
  Filled 2019-09-26: qty 4

## 2019-09-26 MED ORDER — FENTANYL CITRATE (PF) 100 MCG/2ML IJ SOLN
INTRAMUSCULAR | Status: AC | PRN
  Administered 2019-09-26: 50 ug via INTRAVENOUS

## 2019-09-26 MED ORDER — FENTANYL CITRATE (PF) 100 MCG/2ML IJ SOLN
INTRAMUSCULAR | Status: AC
  Filled 2019-09-26: qty 4

## 2019-09-26 NOTE — ED Notes (Signed)
Urologist at bedside.

## 2019-09-26 NOTE — Sedation Documentation (Signed)
Patient is resting comfortably. 

## 2019-09-26 NOTE — ED Notes (Signed)
Pt returned to room from CT

## 2019-09-26 NOTE — Consult Note (Addendum)
Urology Consult   Physician requesting consult: Dr. Theotis Burrow  Reason for consult: Urinary retention.  Recent SPT placement  History of Present Illness: [name] Parra. is a 74 y.o. male s/p suprapubic tube placement 2 weeks ago in [place] due to acute urinary retention (no records available).  Per the patient, the team in [place] made attempts several times to place a urethral catheter, but ultimately had to place a 12 F SPT due to "an obstructed urethra".  The patient presented to the [facility] ER after his SPT stopped draining. He began to have suprapubic pressure and was unable to void per his urethra.  The ER staff attempted to place a 10 F Foley per the SP tract but was unsuccessful.  I attempted to replace the SPT as well as a urethral catheter, but was also unsuccessful.  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    He has a long standing history of BPH that was previously treated with finasteride as well as a history of a "bladder cyst" that was biopsied at an OSH several years ago.  CT from 05/2019 showed a markedly enlarged prostate gland measuring ~ 600 cc.    Past Medical History:  Diagnosis Date  . Blind   . Disorder of bone and cartilage, unspecified   . Edema   . Elevated prostate specific antigen (PSA)   . Hypertension   . Hypopotassemia   . Nonspecific abnormal results of liver function study   . Unspecified glaucoma(365.9)     Past Surgical History:  Procedure Laterality Date  . CYSTOSCOPY WITH FULGERATION N/A 01/17/2018   Procedure: CYSTOSCOPY CLOT EVACUATION WITH FULGERATION TUR BIOPSY OF BLADDER NECK;  Surgeon: Irine Seal, MD;  Location: [facility];  Service: Urology;  Laterality: N/A;  . EYE SURGERY Bilateral 2009   for glaucoma  . EYE SURGERY Left 2014  . HERNIA REPAIR Right 2003   Dr. Jeraldine Loots  . LAPAROSCOPIC APPENDECTOMY N/A 12/24/2012   Procedure:  LAPAROSCOPIC  APPENDECTOMY ;  Surgeon: Pedro Earls, MD;  Location: [facility];  Service: General;  Laterality: N/A;  . TRANSURETHRAL RESECTION OF PROSTATE N/A 05/27/2019   Procedure: Cystoscopy Clot Evacuation and Fulgration;  Surgeon: Ardis Hughs, MD;  Location: [facility];  Service: Urology;  Laterality: N/A;    Current Hospital Medications:  Home Meds:  Current Meds  Medication Sig  . Ascorbic Acid (VITAMIN C WITH ROSE HIPS) 1000 MG tablet Take 1,000 mg by mouth daily with lunch.  Marland Kitchen aspirin-acetaminophen-caffeine (EXCEDRIN MIGRAINE) 250-250-65 MG tablet Take 1 tablet by mouth daily as needed (pain).  . brimonidine (ALPHAGAN) 0.2 % ophthalmic solution Place 1 drop into both eyes 2 (two) times daily.  . ciprofloxacin (CIPRO) 500 MG tablet Take 500 mg by mouth daily.   . dorzolamide-timolol (COSOPT) 22.3-6.8 MG/ML ophthalmic solution APPOINTMENT OVERDUE, instill 1 drop in both eyes twice daily (Patient taking differently: Place 1 drop into both eyes 2 (two) times daily. )  . folic acid (FOLVITE) Q000111Q MCG tablet Take 800 mcg by mouth daily with lunch.   . hydrochlorothiazide (MICROZIDE) 12.5 MG capsule Take 1 capsule (12.5 mg total) by mouth daily.  . Magnesium 250 MG TABS Take 250 mg by mouth daily as needed (migraine headache).   . metoprolol tartrate (LOPRESSOR) 25 MG tablet Take 12.5 mg by mouth 2 (two) times daily.  . pilocarpine (PILOCAR) 2 % ophthalmic solution Place 1 drop into the  right eye daily.   . potassium chloride SA (KLOR-CON) 20 MEQ tablet Take 1 tablet (20 mEq total) by mouth daily.  Marland Kitchen pyridOXINE (VITAMIN B-6) 50 MG tablet Take 50 mg by mouth daily with lunch.  . vitamin B-12 (CYANOCOBALAMIN) 1000 MCG tablet Take 1,000 mcg by mouth daily with lunch.    Scheduled Meds: Continuous Infusions: PRN Meds:.  Allergies: No Known Allergies  Family History  Problem Relation Age of Onset  . Kidney failure Mother     Social History:  reports that he quit smoking about 51 years ago. His  smoking use included cigarettes. He has never used smokeless tobacco. He reports that he does not drink alcohol or use drugs.  ROS: A complete review of systems was performed.  All systems are negative except for pertinent findings as noted.  Physical Exam:  Vital signs in last 24 hours: Temp:  [97.7 F (36.5 C)-98.6 F (37 C)] 97.7 F (36.5 C) (04/23 1443) Pulse Rate:  [61-70] 61 (04/23 1443) Resp:  [18] 18 (04/23 1443) BP: (141-150)/(88-89) 141/89 (04/23 1443) SpO2:  [100 %] 100 % (04/23 1443) Weight:  [86.2 kg] 86.2 kg (04/23 1119) Constitutional:  Alert and oriented, No acute distress. Seeing impaired Cardiovascular: Regular rate and rhythm, No JVD Respiratory: Normal respiratory effort, Lungs clear bilaterally GI: Abdomen is soft, nontender, nondistended, no abdominal masses GU: No CVA tenderness Lymphatic: No lymphadenopathy Neurologic: Grossly intact, no focal deficits Psychiatric: Normal mood and affect GU;  Penis is uncircumcised with no lesions. No blood at the urethral meatus.  Laboratory Data:  No results for input(s): WBC, HGB, HCT, PLT in the last 72 hours.  No results for input(s): NA, K, CL, GLUCOSE, BUN, CALCIUM, CREATININE in the last 72 hours.  Invalid input(s): CO3   No results found for this or any previous visit (from the past 24 hour(s)). No results found for this or any previous visit (from the past 240 hour(s)).  Renal Function: Recent Labs    09/22/19 1221  CREATININE 0.80   Estimated Creatinine Clearance: 87.8 mL/min (by C-G formula based on SCr of 0.8 mg/dL).  Radiologic Imaging: CLINICAL DATA: .Hematuria, unknown cause Gross hematuira; massive prostate. NEEDS delayed images to eval kideny/upper tracts. Eval lymphadenopathy  EXAM: CT ABDOMEN AND PELVIS WITH CONTRAST  TECHNIQUE: Multidetector CT imaging of the abdomen and pelvis was performed using the standard protocol following bolus administration of intravenous  contrast.  CONTRAST: 175mL OMNIPAQUE IOHEXOL 300 MG/ML SOLN  COMPARISON: CT 12/23/2012  FINDINGS: Lower chest: Lung bases are clear.  Hepatobiliary: No focal hepatic lesion. No biliary duct dilatation. Gallbladder is normal. Common bile duct is normal.  Pancreas: Pancreas is normal. No ductal dilatation. No pancreatic inflammation.  Spleen: Normal spleen  Adrenals/urinary tract: Adrenal glands normal.  There bilateral nonenhancing renal cysts. No enhancing renal cortical lesion identified.  No filling defects within collecting systems ureters. No ureteral calculi or obstruction.  Prostate is massively enlarged measuring 11.9 cm in craniocaudad dimension (image 64/10/sagittal). This compares to 11.3 cm on CT from 2014 consistent with interval minimal enlargement over this long time period. Superior to the bladder there is high-density material measuring 3 cm in thickness (image 64/10). No evidence enhancement from the pre to postcontrast imaging. On the delayed imaging excreted contrast surrounds this mass creating a large filling defect. This filling defect occupies the near entirety of the elevated bladder lumen measuring 7.9 x 3.9 cm.  There is gas within the bladder.  No filling defects collecting systems ureters described  above  Stomach/Bowel: Stomach, small-bowel cecum normal. Post appendectomy. Colon rectosigmoid normal.  Vascular/Lymphatic: Abdominal aorta is normal caliber. No periportal or retroperitoneal adenopathy. No pelvic adenopathy.  Reproductive: Prostate massively enlarged as described in the bladder section. Prostate volume equals 11.8 by 11.3 x 8.6 cm (volume = 600 cm^3)  Fat filled LEFT inguinal hernia. The small bilateral hydroceles noted.  Other: No free fluid.  Musculoskeletal: No aggressive osseous lesion.  IMPRESSION: 1. Prostate gland is massively enlarged (600 cubic cm) but similar to comparison CT 2014. 2. Large filling defect  within the lumen of the bladder superior to the massive prostate suggesting hematoma. Bladder mass less favored. 3. Gas within bladder may relate to instrumentation. 4. No filling defect within the renal collecting systems or ureters. 5. No enhancing renal cortical lesion. Bilateral Bosniak I renal cysts. 6. Bilateral small hydroceles. 7. No lymphadenopathy.   Electronically Signed By: Suzy Bouchard M.D. On: 05/27/2019 12:01  I independently reviewed the above imaging studies.  Impression/Recommendation 74 year old male with an anterior urethral stricture along with urinary retention and a markedly enlarged prostate gland (600cc)  -I will consult IR to replace a suprapubic catheter.   This plan was discussed in detail with the patient who voices understanding.  I will arrange OP f/u in 2 weeks for possible SPT exchange.    Ellison Hughs, MD [facility] 09/26/2019, 6:34 PM

## 2019-09-26 NOTE — Sedation Documentation (Signed)
Pt resting at this time. Procedure started.

## 2019-09-26 NOTE — ED Provider Notes (Signed)
9:45 PM-he has returned from IR where a suprapubic catheter was placed by the interventional radiologist.  Previously urology has been unable to place it.  The catheter is draining yellow urine.  Patient is in no apparent distress.  Vital signs are reviewed, and he is stable for discharge.   Daleen Bo, MD 09/26/19 2148

## 2019-09-26 NOTE — Procedures (Signed)
Interventional Radiology Procedure Note  Procedure: Placement of a 69F suprapubic bladder catheter.   Complications: None  Estimated Blood Loss: None  Recommendations: - Return to IR in 4-6 weeks for catheter upsize  Signed,  Criselda Peaches, MD

## 2019-09-26 NOTE — ED Provider Notes (Signed)
[facility] EMERGENCY DEPARTMENT Provider Note   CSN: [id number] Arrival date & time: 09/26/19  1112     History Chief Complaint  Patient presents with  . SUPRAPUBIC CATHETER PROBLEM    [name] Ricklefs. is a 74 y.o. male.  74yo M w/ PMH including BPH, HTN, blindness who p/w catheter malfunction. Pt was visiting family in [place] and had problem w/ acute urinary retention. He was admitted to hospital on 4/9. They were unable to place foley catheter and he subsequently had IR placed suprapubic catheter.  He states that he lives here in [place].  He has been doing well at home until yesterday when his catheter started not draining urine and he has had leakage through urethra since then. He denies vomiting or fever.   The history is provided by the patient.       Past Medical History:  Diagnosis Date  . Blind   . Disorder of bone and cartilage, unspecified   . Edema   . Elevated prostate specific antigen (PSA)   . Hypertension   . Hypopotassemia   . Nonspecific abnormal results of liver function study   . Unspecified glaucoma(365.9)     Patient Active Problem List   Diagnosis Date Noted  . AKI (acute kidney injury) ([place]) 05/25/2019  . BPH (benign prostatic hyperplasia) 05/25/2019  . Anemia due to blood loss, chronic   . Acute blood loss anemia 01/14/2018  . Hypokalemia 01/14/2018  . 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 Hematuria 12/23/2012  . Chest pain 12/23/2012  . Anemia due to blood loss, acute 12/23/2012  . Elevated prostate specific antigen (PSA)   . Hypertension   . Edema     Past Surgical History:  Procedure Laterality Date  . CYSTOSCOPY WITH FULGERATION N/A 01/17/2018   Procedure: CYSTOSCOPY CLOT EVACUATION WITH FULGERATION TUR BIOPSY OF BLADDER NECK;  Surgeon: Irine Seal, MD;  Location: [facility];  Service: Urology;  Laterality: N/A;  . EYE SURGERY Bilateral 2009   for glaucoma  . EYE SURGERY Left 2014  . HERNIA REPAIR Right 2003   Dr.  Jeraldine Loots  . LAPAROSCOPIC APPENDECTOMY N/A 12/24/2012   Procedure:  LAPAROSCOPIC APPENDECTOMY ;  Surgeon: Pedro Earls, MD;  Location: [facility];  Service: General;  Laterality: N/A;  . TRANSURETHRAL RESECTION OF PROSTATE N/A 05/27/2019   Procedure: Cystoscopy Clot Evacuation and Fulgration;  Surgeon: Ardis Hughs, MD;  Location: [facility];  Service: Urology;  Laterality: N/A;       Family History  Problem Relation Age of Onset  . Kidney failure Mother     Social History   Tobacco Use  . Smoking status: Former Smoker    Types: Cigarettes    Quit date: 06/05/1968    Years since quitting: 51.3  . Smokeless tobacco: Never Used  Substance Use Topics  . Alcohol use: No    Comment: STOPPED IN 1970S  . Drug use: No    Home Medications Prior to Admission medications   Medication Sig Start Date End Date Taking? Authorizing Provider  brimonidine (ALPHAGAN) 0.2 % ophthalmic solution Place 1 drop into both eyes 2 (two) times daily. 03/11/19   [provider]  ciprofloxacin (CIPRO) 500 MG tablet Take 500 mg by mouth 2 (two) times daily.     [provider]  cyanocobalamin 500 MCG tablet Take 1,000 mcg by mouth every morning.     [provider]  dorzolamide-timolol (COSOPT) 22.3-6.8 MG/ML ophthalmic solution APPOINTMENT OVERDUE, instill 1 drop in  both eyes twice daily 07/14/13   [name], NP  folic acid (FOLVITE) Q000111Q MCG tablet Take 400 mcg by mouth daily in the afternoon.     [provider]  hydrochlorothiazide (MICROZIDE) 12.5 MG capsule Take 1 capsule (12.5 mg total) by mouth daily. 07/03/19   [name], NP  Magnesium 250 MG TABS Take 250 mg by mouth as needed (for headaches).     [provider]  metoprolol tartrate (LOPRESSOR) 25 MG tablet Take 12.5 mg by mouth 2 (two) times daily.    [provider]  pilocarpine (PILOCAR) 2 % ophthalmic solution Place 1 drop into the right eye 4 (four) times daily.  12/25/17    [provider]  potassium chloride SA (KLOR-CON) 20 MEQ tablet Take 1 tablet (20 mEq total) by mouth daily. 07/03/19   [name], NP  pyridOXINE (VITAMIN B-6) 100 MG tablet Take 100 mg by mouth every morning.    [provider]  vitamin C (ASCORBIC ACID) 500 MG tablet Take 1,000 mg by mouth every morning.     [provider]    Allergies    Patient has no known allergies.  Review of Systems   Review of Systems All other systems reviewed and are negative except that which was mentioned in HPI  Physical Exam Updated Vital Signs BP (!) 141/89 (BP Location: Left Arm)   Pulse 61   Temp 97.7 F (36.5 C) (Oral)   Resp 18   Ht 5\' 8"  (1.727 m)   Wt 86.2 kg   SpO2 100%   BMI 28.89 kg/m   Physical Exam Vitals and nursing note reviewed.  Constitutional:      General: He is not in acute distress.    Appearance: He is well-developed.  HENT:     Head: Normocephalic and atraumatic.  Eyes:     Conjunctiva/sclera: Conjunctivae normal.  Pulmonary:     Effort: Pulmonary effort is normal.  Abdominal:     General: There is distension (suprapubic).     Palpations: Abdomen is soft.     Tenderness: There is abdominal tenderness (suprapubic).  Genitourinary:    Comments: Inverted penis; suprapubic 85F catheter sutured in place, not draining Musculoskeletal:     Cervical back: Neck supple.     Right lower leg: Edema present.     Left lower leg: Edema present.  Skin:    General: Skin is warm and dry.  Neurological:     Mental Status: He is alert and oriented to person, place, and time.  Psychiatric:        Judgment: Judgment normal.     ED Results / Procedures / Treatments   Labs (all labs ordered are listed, but only abnormal results are displayed) Labs Reviewed - No data to display  EKG None  Radiology No results found.  Procedures Procedures (including critical care time)  Medications Ordered in ED Medications - No data to  display  ED Course  I have reviewed the triage vital signs and the nursing notes.  Pertinent labs that were available during my care of the patient were reviewed by me and considered in my medical decision making (see chart for details).    MDM Rules/Calculators/A&P                      Pt comfortable on exam. Discussed w/ Dr. Lovena Neighbours, who recommended trying to place 85F foley. We were able to find a 50F foley and I  attempted placement without success, meeting resistance internally. I have contacted Dr. Lovena Neighbours again and he will assess the patient as soon as he is finished in OR. Signed out pending catheter replacement. Final Clinical Impression(s) / ED Diagnoses Final diagnoses:  None    Rx / DC Orders ED Discharge Orders    None       Katara Griner, Wenda Overland, MD 09/26/19 1614

## 2019-09-26 NOTE — ED Triage Notes (Signed)
Patient c/o no output from suprapubic catheter placed approx 10 days ago d/t retention; stated unable to have uretheral catheter d/t prostate swelling. Patient endorsed recently had finasteride discontinued.

## 2019-09-26 NOTE — Sedation Documentation (Signed)
Vital signs stable. 

## 2019-09-29 ENCOUNTER — Emergency Department (HOSPITAL_COMMUNITY)
Admission: EM | Admit: 2019-09-29 | Discharge: 2019-09-29 | Disposition: A | Discharge status: Home / Self Care | Payer: Medicare Other | Source: Home / Self Care | Attending: Emergency Medicine | Admitting: Emergency Medicine

## 2019-09-29 ENCOUNTER — Other Ambulatory Visit: Payer: Self-pay

## 2019-09-29 DIAGNOSIS — T83010D Breakdown (mechanical) of cystostomy catheter, subsequent encounter: Secondary | ICD-10-CM

## 2019-09-29 DIAGNOSIS — N390 Urinary tract infection, site not specified: Secondary | ICD-10-CM | POA: Diagnosis not present

## 2019-09-29 DIAGNOSIS — Z87891 Personal history of nicotine dependence: Secondary | ICD-10-CM | POA: Insufficient documentation

## 2019-09-29 DIAGNOSIS — N401 Enlarged prostate with lower urinary tract symptoms: Secondary | ICD-10-CM | POA: Diagnosis not present

## 2019-09-29 DIAGNOSIS — R339 Retention of urine, unspecified: Secondary | ICD-10-CM

## 2019-09-29 DIAGNOSIS — Z79899 Other long term (current) drug therapy: Secondary | ICD-10-CM | POA: Insufficient documentation

## 2019-09-29 DIAGNOSIS — R319 Hematuria, unspecified: Secondary | ICD-10-CM | POA: Diagnosis not present

## 2019-09-29 DIAGNOSIS — T83511A Infection and inflammatory reaction due to indwelling urethral catheter, initial encounter: Secondary | ICD-10-CM | POA: Diagnosis not present

## 2019-09-29 DIAGNOSIS — I10 Essential (primary) hypertension: Secondary | ICD-10-CM | POA: Insufficient documentation

## 2019-09-29 NOTE — ED Triage Notes (Signed)
Pt states that his suprapubic catheter is not draining correctly. Attempted to empty bag and bag would not drain. Was going to change bag out in triage but unable to determine urinary bag connector type.

## 2019-09-29 NOTE — ED Provider Notes (Signed)
[facility] EMERGENCY DEPARTMENT Provider Note   CSN: [id number] Arrival date & time: 09/29/19  0334     History Chief Complaint  Patient presents with  . Urinary Retention    [name] [name]. is a 74 y.o. male.  Patient to ED c/o a complication with his suprapubic urinary catheter. He reports that he used an indwelling foley catheter since 2014, recently changed to a suprapubic catheter (09/12/19) placed by IR. He was seen again in the ED on 4/23 with obstructed catheter and IR was able to place a new suprapubic device. Tonight he reports he was unable to empty the collection bag after it had filled because of an obstruction at the drainage port. No abdominal pain, nausea, fever.   The history is provided by the patient. No language interpreter was used.       Past Medical History:  Diagnosis Date  . Blind   . Disorder of bone and cartilage, unspecified   . Edema   . Elevated prostate specific antigen (PSA)   . Hypertension   . Hypopotassemia   . Nonspecific abnormal results of liver function study   . Unspecified glaucoma(365.9)     Patient Active Problem List   Diagnosis Date Noted  . AKI (acute kidney injury) ([place]) 05/25/2019  . BPH (benign prostatic hyperplasia) 05/25/2019  . Anemia due to blood loss, chronic   . Acute blood loss anemia 01/14/2018  . Hypokalemia 01/14/2018  . 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 Hematuria 12/23/2012  . Chest pain 12/23/2012  . Anemia due to blood loss, acute 12/23/2012  . Elevated prostate specific antigen (PSA)   . Hypertension   . Edema     Past Surgical History:  Procedure Laterality Date  . CYSTOSCOPY WITH FULGERATION N/A 01/17/2018   Procedure: CYSTOSCOPY CLOT EVACUATION WITH FULGERATION TUR BIOPSY OF BLADDER NECK;  Surgeon: Irine Seal, MD;  Location: [facility];  Service: Urology;  Laterality: N/A;  . EYE SURGERY Bilateral 2009   for glaucoma  . EYE SURGERY Left 2014  . HERNIA REPAIR Right 2003   Dr. Jeraldine Loots  . LAPAROSCOPIC APPENDECTOMY N/A 12/24/2012   Procedure:  LAPAROSCOPIC APPENDECTOMY ;  Surgeon: Pedro Earls, MD;  Location: [facility];  Service: General;  Laterality: N/A;  . TRANSURETHRAL RESECTION OF PROSTATE N/A 05/27/2019   Procedure: Cystoscopy Clot Evacuation and Fulgration;  Surgeon: Ardis Hughs, MD;  Location: [facility];  Service: Urology;  Laterality: N/A;       Family History  Problem Relation Age of Onset  . Kidney failure Mother     Social History   Tobacco Use  . Smoking status: Former Smoker    Types: Cigarettes    Quit date: 06/05/1968    Years since quitting: 51.3  . Smokeless tobacco: Never Used  Substance Use Topics  . Alcohol use: No    Comment: STOPPED IN 1970S  . Drug use: No    Home Medications Prior to Admission medications   Medication Sig Start Date End Date Taking? Authorizing Provider  Ascorbic Acid (VITAMIN C WITH ROSE HIPS) 1000 MG tablet Take 1,000 mg by mouth daily with lunch.    [provider]  aspirin-acetaminophen-caffeine (EXCEDRIN MIGRAINE) [phone] MG tablet Take 1 tablet by mouth daily as needed (pain).    [provider]  brimonidine (ALPHAGAN) 0.2 % ophthalmic solution Place 1 drop into both eyes 2 (two) times daily. 03/11/19   [provider]  ciprofloxacin (CIPRO) 500 MG tablet Take 500  mg by mouth daily.     [provider]  dorzolamide-timolol (COSOPT) 22.3-6.8 MG/ML ophthalmic solution APPOINTMENT OVERDUE, instill 1 drop in both eyes twice daily Patient taking differently: Place 1 drop into both eyes 2 (two) times daily.  07/14/13   [name], NP  folic acid (FOLVITE) [id number] MCG tablet Take 800 mcg by mouth daily with lunch.     [provider]  hydrochlorothiazide (MICROZIDE) 12.5 MG capsule Take 1 capsule (12.5 mg total) by mouth daily. 07/03/19   [name], NP  Magnesium 250 MG TABS Take 250 mg by mouth daily as needed (migraine headache).     [provider]  metoprolol tartrate (LOPRESSOR) 25 MG tablet Take 12.5 mg by mouth 2 (two) times daily.    [provider]  pilocarpine (PILOCAR) 2 % ophthalmic solution Place 1 drop into the right eye daily.  12/25/17   [provider]  potassium chloride SA (KLOR-CON) 20 MEQ tablet Take 1 tablet (20 mEq total) by mouth daily. 07/03/19   [name], NP  pyridOXINE (VITAMIN B-6) 50 MG tablet Take 50 mg by mouth daily with lunch.    [provider]  vitamin B-12 (CYANOCOBALAMIN) 1000 MCG tablet Take 1,000 mcg by mouth daily with lunch.    [provider]    Allergies    Patient has no known allergies.  Review of Systems   Review of Systems  Constitutional: Negative for fever.  Gastrointestinal: Negative for abdominal pain.  Genitourinary:       See HPI.  Musculoskeletal: Negative for myalgias.  Neurological: Negative for weakness.    Physical Exam Updated Vital Signs BP (!) 132/104 (BP Location: Left Arm)   Pulse 70   Temp 97.8 F (36.6 C) (Oral)   Resp 18   SpO2 98%   Physical Exam Constitutional:      Appearance: He is well-developed.  Pulmonary:     Effort: Pulmonary effort is normal.  Abdominal:     General: There is no distension.     Tenderness: There is no abdominal tenderness.  Genitourinary:    Comments: Collection bag for suprapubic catheter full of bloody urine. There is also urine in the drainage tube.  Musculoskeletal:        General: Normal range of motion.     Cervical back: Normal range of motion.  Skin:    General: Skin is warm and dry.  Neurological:     Mental Status: He is alert and oriented to person, place, and time.     ED Results / Procedures / Treatments   Labs (all labs ordered are listed, but only abnormal results are displayed) Labs Reviewed - No data to display  EKG None  Radiology No results found.  Procedures Procedures (including critical care time)  Medications Ordered in  ED Medications - No data to display  ED Course  I have reviewed the triage vital signs and the nursing notes.  Pertinent labs & imaging results that were available during my care of the patient were reviewed by me and considered in my medical decision making (see chart for details).    MDM Rules/Calculators/A&P                     Patient to ED with suprapubic drainage catheter complication as detailed in the HPI.   Collection bag replaced in the ED and is draining appropriately. The patient is encouraged to call his urologist today to be seen  in follow up this week.   Final Clinical Impression(s) / ED Diagnoses Final diagnoses:  None   1. Suprapubic catheter complication  Rx / DC Orders ED Discharge Orders    None       Dennie Bible 09/29/19 [id number]    Fatima Blank, MD 09/29/19 [id number]

## 2019-09-29 NOTE — ED Notes (Signed)
Pt's urine bag changed and is now draining properly.

## 2019-10-02 ENCOUNTER — Other Ambulatory Visit: Payer: Self-pay

## 2019-10-02 ENCOUNTER — Encounter (HOSPITAL_COMMUNITY)
Admission: EM | Disposition: A | Discharge status: Home / Self Care | Payer: Self-pay | Source: Home / Self Care | Attending: Internal Medicine

## 2019-10-02 ENCOUNTER — Inpatient Hospital Stay (HOSPITAL_COMMUNITY): Payer: Medicare Other | Admitting: Certified Registered Nurse Anesthetist

## 2019-10-02 ENCOUNTER — Encounter (HOSPITAL_COMMUNITY): Payer: Self-pay | Admitting: Emergency Medicine

## 2019-10-02 ENCOUNTER — Inpatient Hospital Stay (HOSPITAL_COMMUNITY): Payer: Medicare Other

## 2019-10-02 ENCOUNTER — Inpatient Hospital Stay (HOSPITAL_COMMUNITY)
Admission: EM | Admit: 2019-10-02 | Discharge: 2019-10-08 | DRG: 717 | Disposition: A | Discharge status: Home Health | Payer: Medicare Other | Attending: Internal Medicine | Admitting: Internal Medicine

## 2019-10-02 DIAGNOSIS — R310 Gross hematuria: Secondary | ICD-10-CM | POA: Diagnosis not present

## 2019-10-02 DIAGNOSIS — J95821 Acute postprocedural respiratory failure: Secondary | ICD-10-CM | POA: Diagnosis not present

## 2019-10-02 DIAGNOSIS — N401 Enlarged prostate with lower urinary tract symptoms: Secondary | ICD-10-CM | POA: Diagnosis not present

## 2019-10-02 DIAGNOSIS — N35912 Unspecified bulbous urethral stricture, male: Secondary | ICD-10-CM | POA: Diagnosis not present

## 2019-10-02 DIAGNOSIS — Z466 Encounter for fitting and adjustment of urinary device: Secondary | ICD-10-CM | POA: Diagnosis present

## 2019-10-02 DIAGNOSIS — I471 Supraventricular tachycardia: Secondary | ICD-10-CM | POA: Diagnosis not present

## 2019-10-02 DIAGNOSIS — J189 Pneumonia, unspecified organism: Secondary | ICD-10-CM | POA: Diagnosis not present

## 2019-10-02 DIAGNOSIS — Z0189 Encounter for other specified special examinations: Secondary | ICD-10-CM

## 2019-10-02 DIAGNOSIS — N3289 Other specified disorders of bladder: Secondary | ICD-10-CM | POA: Diagnosis present

## 2019-10-02 DIAGNOSIS — R918 Other nonspecific abnormal finding of lung field: Secondary | ICD-10-CM | POA: Diagnosis present

## 2019-10-02 DIAGNOSIS — N179 Acute kidney failure, unspecified: Secondary | ICD-10-CM | POA: Diagnosis not present

## 2019-10-02 DIAGNOSIS — E669 Obesity, unspecified: Secondary | ICD-10-CM | POA: Diagnosis present

## 2019-10-02 DIAGNOSIS — Z20822 Contact with and (suspected) exposure to covid-19: Secondary | ICD-10-CM | POA: Diagnosis present

## 2019-10-02 DIAGNOSIS — R600 Localized edema: Secondary | ICD-10-CM | POA: Diagnosis not present

## 2019-10-02 DIAGNOSIS — N421 Congestion and hemorrhage of prostate: Secondary | ICD-10-CM

## 2019-10-02 DIAGNOSIS — Z03818 Encounter for observation for suspected exposure to other biological agents ruled out: Secondary | ICD-10-CM | POA: Diagnosis not present

## 2019-10-02 DIAGNOSIS — D62 Acute posthemorrhagic anemia: Secondary | ICD-10-CM | POA: Diagnosis present

## 2019-10-02 DIAGNOSIS — J9601 Acute respiratory failure with hypoxia: Secondary | ICD-10-CM | POA: Diagnosis not present

## 2019-10-02 DIAGNOSIS — R319 Hematuria, unspecified: Secondary | ICD-10-CM | POA: Diagnosis present

## 2019-10-02 DIAGNOSIS — Y95 Nosocomial condition: Secondary | ICD-10-CM | POA: Diagnosis not present

## 2019-10-02 DIAGNOSIS — T83511A Infection and inflammatory reaction due to indwelling urethral catheter, initial encounter: Secondary | ICD-10-CM | POA: Diagnosis not present

## 2019-10-02 DIAGNOSIS — I10 Essential (primary) hypertension: Secondary | ICD-10-CM | POA: Diagnosis present

## 2019-10-02 DIAGNOSIS — Z6828 Body mass index (BMI) 28.0-28.9, adult: Secondary | ICD-10-CM

## 2019-10-02 DIAGNOSIS — A419 Sepsis, unspecified organism: Secondary | ICD-10-CM | POA: Diagnosis not present

## 2019-10-02 DIAGNOSIS — Z4682 Encounter for fitting and adjustment of non-vascular catheter: Secondary | ICD-10-CM | POA: Diagnosis not present

## 2019-10-02 DIAGNOSIS — D72829 Elevated white blood cell count, unspecified: Secondary | ICD-10-CM

## 2019-10-02 DIAGNOSIS — R338 Other retention of urine: Secondary | ICD-10-CM | POA: Diagnosis not present

## 2019-10-02 DIAGNOSIS — M7989 Other specified soft tissue disorders: Secondary | ICD-10-CM | POA: Diagnosis present

## 2019-10-02 DIAGNOSIS — D649 Anemia, unspecified: Secondary | ICD-10-CM | POA: Diagnosis not present

## 2019-10-02 DIAGNOSIS — Z79899 Other long term (current) drug therapy: Secondary | ICD-10-CM

## 2019-10-02 DIAGNOSIS — R509 Fever, unspecified: Secondary | ICD-10-CM | POA: Diagnosis not present

## 2019-10-02 DIAGNOSIS — Z87891 Personal history of nicotine dependence: Secondary | ICD-10-CM | POA: Diagnosis not present

## 2019-10-02 DIAGNOSIS — Z743 Need for continuous supervision: Secondary | ICD-10-CM | POA: Diagnosis not present

## 2019-10-02 DIAGNOSIS — H548 Legal blindness, as defined in USA: Secondary | ICD-10-CM | POA: Diagnosis not present

## 2019-10-02 DIAGNOSIS — N400 Enlarged prostate without lower urinary tract symptoms: Secondary | ICD-10-CM | POA: Diagnosis present

## 2019-10-02 DIAGNOSIS — T83198A Other mechanical complication of other urinary devices and implants, initial encounter: Secondary | ICD-10-CM | POA: Diagnosis not present

## 2019-10-02 DIAGNOSIS — R579 Shock, unspecified: Secondary | ICD-10-CM | POA: Diagnosis not present

## 2019-10-02 DIAGNOSIS — Z01818 Encounter for other preprocedural examination: Secondary | ICD-10-CM

## 2019-10-02 DIAGNOSIS — R609 Edema, unspecified: Secondary | ICD-10-CM | POA: Diagnosis not present

## 2019-10-02 DIAGNOSIS — Z539 Procedure and treatment not carried out, unspecified reason: Secondary | ICD-10-CM

## 2019-10-02 HISTORY — PX: CYSTOSTOMY: SHX155

## 2019-10-02 HISTORY — DX: Anemia, unspecified: D64.9

## 2019-10-02 HISTORY — PX: CYSTOSCOPY WITH DIRECT VISION INTERNAL URETHROTOMY: SHX6637

## 2019-10-02 LAB — BASIC METABOLIC PANEL
Anion gap: 17 — ABNORMAL HIGH (ref 5–15)
BUN: 24 mg/dL — ABNORMAL HIGH (ref 8–23)
CO2: 17 mmol/L — ABNORMAL LOW (ref 22–32)
Calcium: 8.6 mg/dL — ABNORMAL LOW (ref 8.9–10.3)
Chloride: 97 mmol/L — ABNORMAL LOW (ref 98–111)
Creatinine, Ser: 1.79 mg/dL — ABNORMAL HIGH (ref 0.61–1.24)
GFR calc Af Amer: 43 mL/min — ABNORMAL LOW (ref >60)
GFR calc non Af Amer: 37 mL/min — ABNORMAL LOW (ref >60)
Glucose, Bld: 139 mg/dL — ABNORMAL HIGH (ref 70–99)
Potassium: 4.2 mmol/L (ref 3.5–5.1)
Sodium: 131 mmol/L — ABNORMAL LOW (ref 135–145)

## 2019-10-02 LAB — CBC WITH DIFFERENTIAL/PLATELET
Abs Immature Granulocytes: 0.08 10*3/uL — ABNORMAL HIGH (ref 0.00–0.07)
Basophils Absolute: 0.1 10*3/uL (ref 0.0–0.1)
Basophils Relative: 1 %
Eosinophils Absolute: 0 10*3/uL (ref 0.0–0.5)
Eosinophils Relative: 0 %
HCT: 24.6 % — ABNORMAL LOW (ref 39.0–52.0)
Hemoglobin: 7.1 g/dL — ABNORMAL LOW (ref 13.0–17.0)
Immature Granulocytes: 1 %
Lymphocytes Relative: 11 %
Lymphs Abs: 1.4 10*3/uL (ref 0.7–4.0)
MCH: 20.3 pg — ABNORMAL LOW (ref 26.0–34.0)
MCHC: 28.9 g/dL — ABNORMAL LOW (ref 30.0–36.0)
MCV: 70.5 fL — ABNORMAL LOW (ref 80.0–100.0)
Monocytes Absolute: 1.4 10*3/uL — ABNORMAL HIGH (ref 0.1–1.0)
Monocytes Relative: 11 %
Neutro Abs: 10.3 10*3/uL — ABNORMAL HIGH (ref 1.7–7.7)
Neutrophils Relative %: 76 %
Platelets: 361 10*3/uL (ref 150–400)
RBC: 3.49 MIL/uL — ABNORMAL LOW (ref 4.22–5.81)
RDW: 29.7 % — ABNORMAL HIGH (ref 11.5–15.5)
WBC: 13.2 10*3/uL — ABNORMAL HIGH (ref 4.0–10.5)
nRBC: 0 % (ref 0.0–0.2)

## 2019-10-02 LAB — HEPATIC FUNCTION PANEL
ALT: 35 U/L (ref 0–44)
AST: 46 U/L — ABNORMAL HIGH (ref 15–41)
Albumin: 3.1 g/dL — ABNORMAL LOW (ref 3.5–5.0)
Alkaline Phosphatase: 95 U/L (ref 38–126)
Bilirubin, Direct: 0.2 mg/dL (ref 0.0–0.2)
Indirect Bilirubin: 0.6 mg/dL (ref 0.3–0.9)
Total Bilirubin: 0.8 mg/dL (ref 0.3–1.2)
Total Protein: 6.5 g/dL (ref 6.5–8.1)

## 2019-10-02 LAB — IRON AND TIBC
Iron: 19 ug/dL — ABNORMAL LOW (ref 45–182)
Saturation Ratios: 4 % — ABNORMAL LOW (ref 17.9–39.5)
TIBC: 477 ug/dL — ABNORMAL HIGH (ref 250–450)
UIBC: 458 ug/dL

## 2019-10-02 LAB — PREPARE RBC (CROSSMATCH)

## 2019-10-02 LAB — PROTIME-INR
INR: 1.1 (ref 0.8–1.2)
Prothrombin Time: 14.2 seconds (ref 11.4–15.2)

## 2019-10-02 LAB — PHOSPHORUS: Phosphorus: 5 mg/dL — ABNORMAL HIGH (ref 2.5–4.6)

## 2019-10-02 LAB — MAGNESIUM: Magnesium: 1.9 mg/dL (ref 1.7–2.4)

## 2019-10-02 LAB — RESPIRATORY PANEL BY RT PCR (FLU A&B, COVID)
Influenza A by PCR: NEGATIVE
Influenza B by PCR: NEGATIVE
SARS Coronavirus 2 by RT PCR: NEGATIVE

## 2019-10-02 LAB — TSH: TSH: 2.205 u[IU]/mL (ref 0.350–4.500)

## 2019-10-02 LAB — FERRITIN: Ferritin: 21 ng/mL — ABNORMAL LOW (ref 24–336)

## 2019-10-02 SURGERY — CYSTOSCOPY, WITH DIRECT VISION INTERNAL URETHROTOMY
Anesthesia: General

## 2019-10-02 MED ORDER — SODIUM CHLORIDE 0.9% IV SOLUTION: Freq: Once | INTRAVENOUS | Status: DC

## 2019-10-02 MED ORDER — ACETAMINOPHEN 325 MG PO TABS
650.0000 mg | ORAL_TABLET | Freq: Four times a day (QID) | ORAL | Status: DC | PRN
  Administered 2019-10-04 – 2019-10-07 (×6): 650 mg via ORAL
  Filled 2019-10-02 (×7): qty 2

## 2019-10-02 MED ORDER — IOHEXOL 300 MG/ML  SOLN
INTRAMUSCULAR | Status: DC | PRN
  Administered 2019-10-02: 100 mL via URETHRAL

## 2019-10-02 MED ORDER — ONDANSETRON HCL 4 MG/2ML IJ SOLN: 4.0000 mg | Freq: Four times a day (QID) | INTRAMUSCULAR | Status: DC | PRN

## 2019-10-02 MED ORDER — FOLIC ACID 1 MG PO TABS: 1.0000 mg | ORAL_TABLET | Freq: Every day | ORAL | Status: DC

## 2019-10-02 MED ORDER — SODIUM CHLORIDE 0.9 % IR SOLN
Status: DC | PRN
  Administered 2019-10-02: 6000 mL
  Administered 2019-10-03: 9000 mL

## 2019-10-02 MED ORDER — LACTATED RINGERS IV SOLN: INTRAVENOUS | Status: DC | PRN

## 2019-10-02 MED ORDER — DORZOLAMIDE HCL-TIMOLOL MAL 2-0.5 % OP SOLN
1.0000 [drp] | Freq: Two times a day (BID) | OPHTHALMIC | Status: DC
  Administered 2019-10-03 – 2019-10-08 (×5): 1 [drp] via OPHTHALMIC
  Filled 2019-10-02 (×2): qty 10

## 2019-10-02 MED ORDER — LIDOCAINE 2% (20 MG/ML) 5 ML SYRINGE
INTRAMUSCULAR | Status: DC | PRN
  Administered 2019-10-02: 60 mg via INTRAVENOUS

## 2019-10-02 MED ORDER — MAGNESIUM OXIDE 400 (241.3 MG) MG PO TABS
200.0000 mg | ORAL_TABLET | Freq: Every day | ORAL | Status: DC | PRN
  Filled 2019-10-02: qty 0.5

## 2019-10-02 MED ORDER — ACETAMINOPHEN 650 MG RE SUPP: 650.0000 mg | Freq: Four times a day (QID) | RECTAL | Status: DC | PRN

## 2019-10-02 MED ORDER — ASCORBIC ACID 500 MG PO TABS: 1000.0000 mg | ORAL_TABLET | Freq: Every day | ORAL | Status: DC

## 2019-10-02 MED ORDER — PILOCARPINE HCL 2 % OP SOLN
1.0000 [drp] | Freq: Four times a day (QID) | OPHTHALMIC | Status: DC
  Administered 2019-10-03 – 2019-10-08 (×8): 1 [drp] via OPHTHALMIC
  Filled 2019-10-02 (×3): qty 15

## 2019-10-02 MED ORDER — ONDANSETRON HCL 4 MG/2ML IJ SOLN
INTRAMUSCULAR | Status: DC | PRN
  Administered 2019-10-02: 4 mg via INTRAVENOUS

## 2019-10-02 MED ORDER — DEXAMETHASONE SODIUM PHOSPHATE 10 MG/ML IJ SOLN
INTRAMUSCULAR | Status: DC | PRN
  Administered 2019-10-02: 4 mg via INTRAVENOUS

## 2019-10-02 MED ORDER — SODIUM CHLORIDE 0.9 % IV SOLN: 10.0000 mL/h | Freq: Once | INTRAVENOUS | Status: DC

## 2019-10-02 MED ORDER — HYDROMORPHONE HCL 1 MG/ML IJ SOLN: 0.5000 mg | INTRAMUSCULAR | Status: DC | PRN

## 2019-10-02 MED ORDER — PROPOFOL 10 MG/ML IV BOLUS
INTRAVENOUS | Status: AC
  Filled 2019-10-02: qty 20

## 2019-10-02 MED ORDER — CEFAZOLIN SODIUM-DEXTROSE 2-3 GM-%(50ML) IV SOLR
INTRAVENOUS | Status: DC | PRN
  Administered 2019-10-02 – 2019-10-03 (×2): 2 g via INTRAVENOUS

## 2019-10-02 MED ORDER — BRIMONIDINE TARTRATE 0.2 % OP SOLN
1.0000 [drp] | Freq: Two times a day (BID) | OPHTHALMIC | Status: DC
  Administered 2019-10-03 – 2019-10-08 (×4): 1 [drp] via OPHTHALMIC
  Filled 2019-10-02 (×2): qty 5

## 2019-10-02 MED ORDER — METOPROLOL TARTRATE 12.5 MG HALF TABLET: 12.5000 mg | ORAL_TABLET | Freq: Two times a day (BID) | ORAL | Status: DC

## 2019-10-02 MED ORDER — FENTANYL CITRATE (PF) 250 MCG/5ML IJ SOLN
INTRAMUSCULAR | Status: AC
  Filled 2019-10-02: qty 5

## 2019-10-02 MED ORDER — ONDANSETRON HCL 4 MG PO TABS: 4.0000 mg | ORAL_TABLET | Freq: Four times a day (QID) | ORAL | Status: DC | PRN

## 2019-10-02 MED ORDER — PROPOFOL 10 MG/ML IV BOLUS
INTRAVENOUS | Status: DC | PRN
  Administered 2019-10-02: 200 mg via INTRAVENOUS

## 2019-10-02 MED ORDER — PHENYLEPHRINE HCL-NACL 10-0.9 MG/250ML-% IV SOLN
INTRAVENOUS | Status: DC | PRN
  Administered 2019-10-02: 15 ug/min via INTRAVENOUS

## 2019-10-02 MED ORDER — PHENYLEPHRINE 40 MCG/ML (10ML) SYRINGE FOR IV PUSH (FOR BLOOD PRESSURE SUPPORT)
PREFILLED_SYRINGE | INTRAVENOUS | Status: AC
  Filled 2019-10-02: qty 50

## 2019-10-02 MED ORDER — VITAMIN B-6 100 MG PO TABS: 50.0000 mg | ORAL_TABLET | Freq: Every day | ORAL | Status: DC

## 2019-10-02 MED ORDER — FENTANYL CITRATE (PF) 250 MCG/5ML IJ SOLN
INTRAMUSCULAR | Status: DC | PRN
  Administered 2019-10-02 – 2019-10-03 (×11): 50 ug via INTRAVENOUS

## 2019-10-02 MED ORDER — VITAMIN B-12 1000 MCG PO TABS
1000.0000 ug | ORAL_TABLET | Freq: Every day | ORAL | Status: DC
  Filled 2019-10-02: qty 1

## 2019-10-02 MED ORDER — PHENYLEPHRINE HCL (PRESSORS) 10 MG/ML IV SOLN
INTRAVENOUS | Status: DC | PRN
  Administered 2019-10-02 – 2019-10-03 (×8): 80 ug via INTRAVENOUS
  Administered 2019-10-03: 40 ug via INTRAVENOUS
  Administered 2019-10-03: 80 ug via INTRAVENOUS

## 2019-10-02 SURGICAL SUPPLY — 63 items
BAG URINE DRAIN 2000ML AR STRL (UROLOGICAL SUPPLIES) ×3 IMPLANT
BAG URINE DRAINAGE (UROLOGICAL SUPPLIES) ×3 IMPLANT
BAG URO CATCHER STRL LF (MISCELLANEOUS) ×6 IMPLANT
BALLN NEPHROSTOMY (BALLOONS) ×3
BALLOON NEPHROSTOMY (BALLOONS) ×1 IMPLANT
CATH FOLEY 2WAY SLVR  5CC 22FR (CATHETERS) ×4
CATH FOLEY 2WAY SLVR 5CC 22FR (CATHETERS) ×2 IMPLANT
CATH FOLEY 3WAY 30CC 22FR (CATHETERS) ×3 IMPLANT
CATH FOLEY 3WAY 30CC 24FR (CATHETERS) ×2
CATH HEMA 3WAY 30CC 22FR COUDE (CATHETERS) ×3 IMPLANT
CATH SET URETHRAL DILATOR (CATHETERS) ×3 IMPLANT
CATH URET 5FR 28IN OPEN ENDED (CATHETERS) ×3 IMPLANT
CATH URTH STD 24FR FL 3W 2 (CATHETERS) ×1 IMPLANT
COVER FOOTSWITCH UNIV (MISCELLANEOUS) ×3 IMPLANT
DRAIN CHANNEL 10F 3/8 F FF (DRAIN) ×3 IMPLANT
DRAPE LAPAROTOMY 100X72X124 (DRAPES) ×3 IMPLANT
DRAPE WARM FLUID 44X44 (DRAPES) ×3 IMPLANT
DRSG OPSITE POSTOP 4X6 (GAUZE/BANDAGES/DRESSINGS) ×3 IMPLANT
ELECT PAD DSPR THERM+ ADLT (MISCELLANEOUS) ×3 IMPLANT
ELECT REM PT RETURN 9FT ADLT (ELECTROSURGICAL) ×3
ELECTRODE REM PT RTRN 9FT ADLT (ELECTROSURGICAL) ×1 IMPLANT
EVACUATOR SILICONE 100CC (DRAIN) ×3 IMPLANT
GLOVE BIO SURGEON STRL SZ7.5 (GLOVE) ×6 IMPLANT
GOWN STRL REUS W/ TWL LRG LVL3 (GOWN DISPOSABLE) ×1 IMPLANT
GOWN STRL REUS W/ TWL XL LVL3 (GOWN DISPOSABLE) ×1 IMPLANT
GOWN STRL REUS W/TWL LRG LVL3 (GOWN DISPOSABLE) ×2
GOWN STRL REUS W/TWL XL LVL3 (GOWN DISPOSABLE) ×2
GUIDEWIRE ANG ZIPWIRE 038X150 (WIRE) ×6 IMPLANT
GUIDEWIRE ANGLED .035X150CM (WIRE) ×6 IMPLANT
HAND PENCIL TRP OPTION (MISCELLANEOUS) ×3 IMPLANT
HANDLE SUCTION POOLE (INSTRUMENTS) ×1 IMPLANT
HEMOSTAT SURGICEL 2X14 (HEMOSTASIS) ×3 IMPLANT
HOLDER FOLEY CATH W/STRAP (MISCELLANEOUS) ×3 IMPLANT
IV NS IRRIG 3000ML ARTHROMATIC (IV SOLUTION) ×33 IMPLANT
KIT TURNOVER KIT B (KITS) ×3 IMPLANT
LOOP CUT BIPOLAR 24F LRG (ELECTROSURGICAL) ×3 IMPLANT
MANIFOLD NEPTUNE II (INSTRUMENTS) ×3 IMPLANT
NEEDLE 18GX1X1/2 (RX/OR ONLY) (NEEDLE) ×9 IMPLANT
NS IRRIG 1000ML POUR BTL (IV SOLUTION) ×3 IMPLANT
PACK CYSTO (CUSTOM PROCEDURE TRAY) ×3 IMPLANT
PACK GENERAL/GYN (CUSTOM PROCEDURE TRAY) ×3 IMPLANT
PENCIL SMOKE EVACUATOR (MISCELLANEOUS) ×3 IMPLANT
PLUG CATH AND CAP STER (CATHETERS) ×3 IMPLANT
PROBE LAPAROSCOPIC 5MM W/FTSWT (MISCELLANEOUS) ×3 IMPLANT
SET IRRIG Y TYPE TUR BLADDER L (SET/KITS/TRAYS/PACK) ×6 IMPLANT
SOL PREP POV-IOD 4OZ 10% (MISCELLANEOUS) ×3 IMPLANT
SPONGE LAP 18X18 RF (DISPOSABLE) ×3 IMPLANT
SUCTION POOLE HANDLE (INSTRUMENTS) ×3
SUT CHROMIC 3 0 SH 27 (SUTURE) ×3 IMPLANT
SUT ETHILON 3 0 FSL (SUTURE) ×3 IMPLANT
SUT ETHILON 3 0 PS 1 (SUTURE) ×3 IMPLANT
SUT PDS AB 0 CTX 60 (SUTURE) ×3 IMPLANT
SUT VIC AB 0 CT1 27 (SUTURE) ×12
SUT VIC AB 0 CT1 27XBRD ANBCTR (SUTURE) ×6 IMPLANT
SUT VIC AB 2-0 CT1 (SUTURE) ×12 IMPLANT
SUT VIC AB 2-0 CT1 27 (SUTURE) ×2
SUT VIC AB 2-0 CT1 TAPERPNT 27 (SUTURE) ×1 IMPLANT
SYR 30ML LL (SYRINGE) ×3 IMPLANT
SYR TOOMEY 50ML (SYRINGE) ×3 IMPLANT
SYRINGE IRR TOOMEY STRL 70CC (SYRINGE) ×3 IMPLANT
TUBE CONNECTING 12'X1/4 (SUCTIONS) ×1
TUBE CONNECTING 12X1/4 (SUCTIONS) ×2 IMPLANT
WIRE BENTSON .035X145CM (WIRE) ×3 IMPLANT

## 2019-10-02 NOTE — ED Notes (Signed)
Pt doe not want to the rate of blood to exceed 187ml an hr.

## 2019-10-02 NOTE — ED Provider Notes (Signed)
[facility] EMERGENCY DEPARTMENT Provider Note   CSN: [id number] Arrival date & time: 10/02/19  [id number]     History Chief Complaint  Patient presents with  . Penis Pain  . Catheter problems    [name] [name]. is a 74 y.o. male.  74 year old male presents with complaint of inability to drain his suprapubic catheter with frank blood in his bag.  Patient has had longstanding indwelling Foley catheter, while down in [place] at the beginning of the month he ended up with a suprapubic catheter as staff was unable to pass a Foley catheter.  Patient returned to this ER on April 23 as well as April 26.  On April 23 he was unable to drain his bag with subsequent clot in the tubing, again unable to pass a Foley catheter, unable to replace the suprapubic catheter, patient ended up with replacement suprapubic catheter with interventional radiology.  Patient return on the 26th unable to drain his bag, bag exchange made and drained successfully.  Patient states he has not been able to drain his back since 2 AM today and feels very distended and uncomfortable, not on any anticoagulants.  No other complaints or concerns today.        Past Medical History:  Diagnosis Date  . Blind   . Disorder of bone and cartilage, unspecified   . Edema   . Elevated prostate specific antigen (PSA)   . Hypertension   . Hypopotassemia   . Nonspecific abnormal results of liver function study   . Unspecified glaucoma(365.9)     Patient Active Problem List   Diagnosis Date Noted  . AKI (acute kidney injury) ([place]) 05/25/2019  . BPH (benign prostatic hyperplasia) 05/25/2019  . Anemia due to blood loss, chronic   . Acute blood loss anemia 01/14/2018  . Hypokalemia 01/14/2018  . 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 Hematuria 12/23/2012  . Chest pain 12/23/2012  . Anemia due to blood loss, acute 12/23/2012  . Elevated prostate specific antigen (PSA)   . Hypertension   . Edema     Past Surgical  History:  Procedure Laterality Date  . CYSTOSCOPY WITH FULGERATION N/A 01/17/2018   Procedure: CYSTOSCOPY CLOT EVACUATION WITH FULGERATION TUR BIOPSY OF BLADDER NECK;  Surgeon: Irine Seal, MD;  Location: [facility];  Service: Urology;  Laterality: N/A;  . EYE SURGERY Bilateral 2009   for glaucoma  . EYE SURGERY Left 2014  . HERNIA REPAIR Right 2003   Dr. Jeraldine Loots  . LAPAROSCOPIC APPENDECTOMY N/A 12/24/2012   Procedure:  LAPAROSCOPIC APPENDECTOMY ;  Surgeon: Pedro Earls, MD;  Location: [facility];  Service: General;  Laterality: N/A;  . TRANSURETHRAL RESECTION OF PROSTATE N/A 05/27/2019   Procedure: Cystoscopy Clot Evacuation and Fulgration;  Surgeon: Ardis Hughs, MD;  Location: [facility];  Service: Urology;  Laterality: N/A;       Family History  Problem Relation Age of Onset  . Kidney failure Mother     Social History   Tobacco Use  . Smoking status: Former Smoker    Types: Cigarettes    Quit date: 06/05/1968    Years since quitting: 51.3  . Smokeless tobacco: Never Used  Substance Use Topics  . Alcohol use: No    Comment: STOPPED IN 1970S  . Drug use: No    Home Medications Prior to Admission medications   Medication Sig Start Date End Date Taking? Authorizing Provider  Ascorbic Acid (VITAMIN C WITH ROSE HIPS) 1000 MG tablet Take 1,000  mg by mouth daily with lunch.   Yes [provider]  aspirin-acetaminophen-caffeine (EXCEDRIN MIGRAINE) [phone] MG tablet Take 1 tablet by mouth daily as needed (pain).   Yes [provider]  brimonidine (ALPHAGAN) 0.2 % ophthalmic solution Place 1 drop into both eyes 2 (two) times daily. 03/11/19  Yes [provider]  ciprofloxacin (CIPRO) 500 MG tablet Take 500 mg by mouth daily.    Yes [provider]  dorzolamide-timolol (COSOPT) 22.3-6.8 MG/ML ophthalmic solution APPOINTMENT OVERDUE, instill 1 drop in both eyes twice daily Patient taking differently: Place 1 drop into both eyes 2 (two) times  daily.  07/14/13  Yes [name], NP  folic acid (FOLVITE) [id number] MCG tablet Take 800 mcg by mouth daily with lunch.    Yes [provider]  hydrochlorothiazide (MICROZIDE) 12.5 MG capsule Take 1 capsule (12.5 mg total) by mouth daily. 07/03/19  Yes [name], NP  Magnesium 250 MG TABS Take 250 mg by mouth daily as needed (migraine headache).    Yes [provider]  metoprolol tartrate (LOPRESSOR) 25 MG tablet Take 12.5 mg by mouth 2 (two) times daily.   Yes [provider]  pilocarpine (PILOCAR) 2 % ophthalmic solution Place 1 drop into the right eye every 6 (six) hours.  12/25/17  Yes [provider]  potassium chloride SA (KLOR-CON) 20 MEQ tablet Take 1 tablet (20 mEq total) by mouth daily. 07/03/19  Yes [name], NP  pyridOXINE (VITAMIN B-6) 50 MG tablet Take 50 mg by mouth daily with lunch.   Yes [provider]  vitamin B-12 (CYANOCOBALAMIN) 1000 MCG tablet Take 1,000 mcg by mouth daily with lunch.   Yes [provider]    Allergies    Patient has no known allergies.  Review of Systems   Review of Systems  Constitutional: Negative for fever.  Respiratory: Negative for shortness of breath.   Cardiovascular: Negative for chest pain.  Gastrointestinal: Positive for abdominal distention. Negative for constipation, diarrhea, nausea and vomiting.  Genitourinary: Positive for difficulty urinating and hematuria.  Skin: Negative for rash and wound.  All other systems reviewed and are negative.   Physical Exam Updated Vital Signs BP 125/86   Pulse 83   Temp 98.8 F (37.1 C) (Oral)   Resp 18   Ht 5\' 8"  (1.727 m)   Wt 86.2 kg   SpO2 100%   BMI 28.89 kg/m   Physical Exam Vitals and nursing note reviewed. Exam conducted with a chaperone present.  Constitutional:      General: He is not in acute distress.    Appearance: He is well-developed. He is not diaphoretic.  HENT:     Head: Normocephalic and atraumatic.   Cardiovascular:     Rate and Rhythm: Normal rate and regular rhythm.  Pulmonary:     Effort: Pulmonary effort is normal.     Breath sounds: Normal breath sounds.  Abdominal:     General: There is distension.  Genitourinary:    Penis: Circumcised.     Skin:    General: Skin is warm and dry.     Findings: No erythema or rash.  Neurological:     Mental Status: He is alert and oriented to person, place, and time.  Psychiatric:        Behavior: Behavior normal.     ED Results / Procedures / Treatments   Labs (all labs ordered are listed, but only abnormal results are displayed) Labs Reviewed  CBC WITH  DIFFERENTIAL/PLATELET - Abnormal; Notable for the following components:      Result Value   WBC 13.2 (*)    RBC 3.49 (*)    Hemoglobin 7.1 (*)    HCT 24.6 (*)    MCV 70.5 (*)    MCH 20.3 (*)    MCHC 28.9 (*)    RDW 29.7 (*)    Neutro Abs 10.3 (*)    Monocytes Absolute 1.4 (*)    Abs Immature Granulocytes 0.08 (*)    All other components within normal limits  BASIC METABOLIC PANEL - Abnormal; Notable for the following components:   Sodium 131 (*)    Chloride 97 (*)    CO2 17 (*)    Glucose, Bld 139 (*)    BUN 24 (*)    Creatinine, Ser 1.79 (*)    Calcium 8.6 (*)    GFR calc non Af Amer 37 (*)    GFR calc Af Amer 43 (*)    Anion gap 17 (*)    All other components within normal limits  HEPATIC FUNCTION PANEL - Abnormal; Notable for the following components:   Albumin 3.1 (*)    AST 46 (*)    All other components within normal limits  RESPIRATORY PANEL BY RT PCR (FLU A&B, COVID)  PROTIME-INR  TYPE AND SCREEN  PREPARE RBC (CROSSMATCH)    EKG None  Radiology No results found.  Procedures .Critical Care Performed by: Tacy Learn, PA-C Authorized by: Tacy Learn, PA-C   Critical care provider statement:    Critical care time (minutes):  45   Critical care was time spent personally by me on the following activities:  Discussions with consultants,  evaluation of patient's response to treatment, examination of patient, ordering and performing treatments and interventions, ordering and review of laboratory studies, ordering and review of radiographic studies, pulse oximetry, re-evaluation of patient's condition, obtaining history from patient or surrogate and review of old charts   (including critical care time)  Medications Ordered in ED Medications  0.9 %  sodium chloride infusion (Manually program via Guardrails IV Fluids) (has no administration in time range)    ED Course  I have reviewed the triage vital signs and the nursing notes.  Pertinent labs & imaging results that were available during my care of the patient were reviewed by me and considered in my medical decision making (see chart for details).  Clinical Course as of Oct 02 1231  Thu Oct 02, 3635  265 74 year old male presents with occluded suprapubic catheter with gross hematuria.  On exam, bladder is distended, leg bag is full with gross hematuria. Review of recent imaging including CT guided drainage by percutaneous catheter, notes recommend follow-up treatment for prostatic artery embolization. Patient with history of anemia, hemoglobin of 10.6 on April 20, repeated today now with hemoglobin of 7.1.  Recommend blood transfusion, patient is agreeable with this.  Will consult interventional radiology to discuss prosthetic artery embolization with plan to admit to hospitalist service.   [LM]  1208 Case discussed with Dr. Laurence Ferrari with interventional radiology, recommends hospitalist admit and formally consult for scheduling of embolization, not needed emergently at this time.   [LM]  1208 Case discussed with Dr. Royston Cowper on-call with [facility] hospitalist service who will consult for admission, request consult to urology.   [LM]  1231 Case discussed with Dr. Gloriann Loan with urology who will consult.   [LM]    Clinical Course User Index [LM] Tacy Learn, PA-C  MDM  Rules/Calculators/A&P                      Final Clinical Impression(s) / ED Diagnoses Final diagnoses:  Gross hematuria  Anemia, unspecified type    Rx / DC Orders ED Discharge Orders    None       Roque Lias 10/02/19 1233    Quintella Reichert, MD 10/03/19 0900

## 2019-10-02 NOTE — H&P (View-Only) (Signed)
H&P Physician requesting consult: [name]  Chief Complaint: Gross hematuria, urethral stricture  History of Present Illness: 74 year old male with a history of gross hematuria secondary to friable 600 g prostate.  He recently presented with urinary retention.  Cystoscopy by Dr. Lovena Neighbours revealed a pinpoint urethral stricture that was unable to be dilated at the bedside.  Therefore, he underwent 54 French suprapubic tube placement by interventional radiology.  He has now had a several day history of gross hematuria.  Hemoglobin is down to 7.1.  He is currently receiving blood.  Patient has no complaints currently.  Past Medical History:  Diagnosis Date  . Blind   . Disorder of bone and cartilage, unspecified   . Edema   . Elevated prostate specific antigen (PSA)   . Hypertension   . Hypopotassemia   . Nonspecific abnormal results of liver function study   . Unspecified glaucoma(365.9)    Past Surgical History:  Procedure Laterality Date  . CYSTOSCOPY WITH FULGERATION N/A 01/17/2018   Procedure: CYSTOSCOPY CLOT EVACUATION WITH FULGERATION TUR BIOPSY OF BLADDER NECK;  Surgeon: Irine Seal, MD;  Location: [facility];  Service: Urology;  Laterality: N/A;  . EYE SURGERY Bilateral 2009   for glaucoma  . EYE SURGERY Left 2014  . HERNIA REPAIR Right 2003   Dr. Jeraldine Loots  . LAPAROSCOPIC APPENDECTOMY N/A 12/24/2012   Procedure:  LAPAROSCOPIC APPENDECTOMY ;  Surgeon: Pedro Earls, MD;  Location: [facility];  Service: General;  Laterality: N/A;  . TRANSURETHRAL RESECTION OF PROSTATE N/A 05/27/2019   Procedure: Cystoscopy Clot Evacuation and Fulgration;  Surgeon: Ardis Hughs, MD;  Location: [facility];  Service: Urology;  Laterality: N/A;    Home Medications:  Medications Prior to Admission  Medication Sig Dispense Refill Last Dose  . Ascorbic Acid (VITAMIN C WITH ROSE HIPS) 1000 MG tablet Take 1,000 mg by mouth daily with lunch.   10/01/2019 at Unknown time  .  aspirin-acetaminophen-caffeine (EXCEDRIN MIGRAINE) 250-250-65 MG tablet Take 1 tablet by mouth daily as needed (pain).   Past Week at Unknown time  . brimonidine (ALPHAGAN) 0.2 % ophthalmic solution Place 1 drop into both eyes 2 (two) times daily.   10/01/2019 at Unknown time  . ciprofloxacin (CIPRO) 500 MG tablet Take 500 mg by mouth daily.    Past Week at Unknown time  . dorzolamide-timolol (COSOPT) 22.3-6.8 MG/ML ophthalmic solution APPOINTMENT OVERDUE, instill 1 drop in both eyes twice daily (Patient taking differently: Place 1 drop into both eyes 2 (two) times daily. ) 10 mL 0 10/01/2019 at Unknown time  . folic acid (FOLVITE) Q000111Q MCG tablet Take 800 mcg by mouth daily with lunch.    10/01/2019 at Unknown time  . hydrochlorothiazide (MICROZIDE) 12.5 MG capsule Take 1 capsule (12.5 mg total) by mouth daily. 90 capsule 1 10/01/2019 at Unknown time  . Magnesium 250 MG TABS Take 250 mg by mouth daily as needed (migraine headache).    Past Month at Unknown time  . metoprolol tartrate (LOPRESSOR) 25 MG tablet Take 12.5 mg by mouth 2 (two) times daily.   Past Week at Unknown time  . pilocarpine (PILOCAR) 2 % ophthalmic solution Place 1 drop into the right eye every 6 (six) hours.   0 10/01/2019 at Unknown time  . potassium chloride SA (KLOR-CON) 20 MEQ tablet Take 1 tablet (20 mEq total) by mouth daily. 90 tablet 1 Past Week at Unknown time  . pyridOXINE (VITAMIN B-6) 50 MG tablet Take 50 mg by mouth daily with  lunch.   Past Week at Unknown time  . vitamin B-12 (CYANOCOBALAMIN) 1000 MCG tablet Take 1,000 mcg by mouth daily with lunch.   Past Week at Unknown time   Allergies: No Known Allergies  Family History  Problem Relation Age of Onset  . Kidney failure Mother    Social History:  reports that he quit smoking about 51 years ago. His smoking use included cigarettes. He has never used smokeless tobacco. He reports that he does not drink alcohol or use drugs.  ROS: A complete review of systems was  performed.  All systems are negative except for pertinent findings as noted. ROS   Physical Exam:  Vital signs in last 24 hours: Temp:  [97.7 F (36.5 C)-98.8 F (37.1 C)] 98.3 F (36.8 C) (04/29 1619) Pulse Rate:  [83-101] 85 (04/29 1619) Resp:  [13-18] 16 (04/29 1619) BP: (111-158)/(78-109) 140/86 (04/29 1619) SpO2:  [99 %-100 %] 99 % (04/29 1619) Weight:  [86.2 kg] 86.2 kg (04/29 0328) General:  Alert and oriented, No acute distress HEENT: Normocephalic, atraumatic Neck: No JVD or lymphadenopathy Cardiovascular: Regular rate and rhythm Lungs: Regular rate and effort Abdomen: Soft, nontender, nondistended, no abdominal masses Genitourinary: Uncircumcised phallus.  He has a suprapubic tube in place.  Aspiration revealed moderate amount of clot.  Unable to irrigate a large amount. Back: No CVA tenderness Extremities: No edema Neurologic: Grossly intact  Laboratory Data:  Results for orders placed or performed during the hospital encounter of 10/02/19 (from the past 24 hour(s))  CBC with Differential     Status: Abnormal   Collection Time: 10/02/19  9:52 AM  Result Value Ref Range   WBC 13.2 (H) 4.0 - 10.5 K/uL   RBC 3.49 (L) 4.22 - 5.81 MIL/uL   Hemoglobin 7.1 (L) 13.0 - 17.0 g/dL   HCT 24.6 (L) 39.0 - 52.0 %   MCV 70.5 (L) 80.0 - 100.0 fL   MCH 20.3 (L) 26.0 - 34.0 pg   MCHC 28.9 (L) 30.0 - 36.0 g/dL   RDW 29.7 (H) 11.5 - 15.5 %   Platelets 361 150 - 400 K/uL   nRBC 0.0 0.0 - 0.2 %   Neutrophils Relative % 76 %   Neutro Abs 10.3 (H) 1.7 - 7.7 K/uL   Lymphocytes Relative 11 %   Lymphs Abs 1.4 0.7 - 4.0 K/uL   Monocytes Relative 11 %   Monocytes Absolute 1.4 (H) 0.1 - 1.0 K/uL   Eosinophils Relative 0 %   Eosinophils Absolute 0.0 0.0 - 0.5 K/uL   Basophils Relative 1 %   Basophils Absolute 0.1 0.0 - 0.1 K/uL   Immature Granulocytes 1 %   Abs Immature Granulocytes 0.08 (H) 0.00 - 0.07 K/uL   Polychromasia PRESENT   Basic metabolic panel     Status: Abnormal    Collection Time: 10/02/19  9:52 AM  Result Value Ref Range   Sodium 131 (L) 135 - 145 mmol/L   Potassium 4.2 3.5 - 5.1 mmol/L   Chloride 97 (L) 98 - 111 mmol/L   CO2 17 (L) 22 - 32 mmol/L   Glucose, Bld 139 (H) 70 - 99 mg/dL   BUN 24 (H) 8 - 23 mg/dL   Creatinine, Ser 1.79 (H) 0.61 - 1.24 mg/dL   Calcium 8.6 (L) 8.9 - 10.3 mg/dL   GFR calc non Af Amer 37 (L) >60 mL/min   GFR calc Af Amer 43 (L) >60 mL/min   Anion gap 17 (H) 5 - 15  Hepatic  function panel     Status: Abnormal   Collection Time: 10/02/19  9:53 AM  Result Value Ref Range   Total Protein 6.5 6.5 - 8.1 g/dL   Albumin 3.1 (L) 3.5 - 5.0 g/dL   AST 46 (H) 15 - 41 U/L   ALT 35 0 - 44 U/L   Alkaline Phosphatase 95 38 - 126 U/L   Total Bilirubin 0.8 0.3 - 1.2 mg/dL   Bilirubin, Direct 0.2 0.0 - 0.2 mg/dL   Indirect Bilirubin 0.6 0.3 - 0.9 mg/dL  Protime-INR     Status: None   Collection Time: 10/02/19  9:53 AM  Result Value Ref Range   Prothrombin Time 14.2 11.4 - 15.2 seconds   INR 1.1 0.8 - 1.2  Respiratory Panel by RT PCR (Flu A&B, Covid) - Nasopharyngeal Swab     Status: None   Collection Time: 10/02/19 12:08 PM   Specimen: Nasopharyngeal Swab  Result Value Ref Range   SARS Coronavirus 2 by RT PCR NEGATIVE NEGATIVE   Influenza A by PCR NEGATIVE NEGATIVE   Influenza B by PCR NEGATIVE NEGATIVE  Magnesium     Status: None   Collection Time: 10/02/19 12:36 PM  Result Value Ref Range   Magnesium 1.9 1.7 - 2.4 mg/dL  Phosphorus     Status: Abnormal   Collection Time: 10/02/19 12:36 PM  Result Value Ref Range   Phosphorus 5.0 (H) 2.5 - 4.6 mg/dL  Iron and TIBC     Status: Abnormal   Collection Time: 10/02/19 12:36 PM  Result Value Ref Range   Iron 19 (L) 45 - 182 ug/dL   TIBC 477 (H) 250 - 450 ug/dL   Saturation Ratios 4 (L) 17.9 - 39.5 %   UIBC 458 ug/dL  Ferritin     Status: Abnormal   Collection Time: 10/02/19 12:36 PM  Result Value Ref Range   Ferritin 21 (L) 24 - 336 ng/mL  TSH     Status: None    Collection Time: 10/02/19 12:36 PM  Result Value Ref Range   TSH 2.205 0.350 - 4.500 uIU/mL  Type and screen [place]     Status: None (Preliminary result)   Collection Time: 10/02/19  1:40 PM  Result Value Ref Range   ABO/RH(D) B POS    Antibody Screen NEG    Sample Expiration 10/05/2019,2359    Unit Number [id number]    Blood Component Type RED CELLS,LR    Unit division 00    Status of Unit ALLOCATED    Transfusion Status OK TO TRANSFUSE    Crossmatch Result Compatible    Unit Number [id number]    Blood Component Type RED CELLS,LR    Unit division 00    Status of Unit ISSUED    Transfusion Status OK TO TRANSFUSE    Crossmatch Result      Compatible Performed at [facility], [address], [place], [place] [address]   Prepare RBC (crossmatch)     Status: None   Collection Time: 10/02/19  1:40 PM  Result Value Ref Range   Order Confirmation      ORDER PROCESSED BY BLOOD BANK Performed at [facility], [place] [address], [place], [place] [address]    Recent Results (from the past 240 hour(s))  Respiratory Panel by RT PCR (Flu A&B, Covid) - Nasopharyngeal Swab     Status: None   Collection Time: 10/02/19 12:08 PM   Specimen: Nasopharyngeal Swab  Result Value  Ref Range Status   SARS Coronavirus 2 by RT PCR NEGATIVE NEGATIVE Final    Comment: (NOTE) SARS-CoV-2 target nucleic acids are NOT DETECTED. The SARS-CoV-2 RNA is generally detectable in upper respiratoy specimens during the acute phase of infection. The lowest concentration of SARS-CoV-2 viral copies this assay can detect is 131 copies/mL. A negative result does not preclude SARS-Cov-2 infection and should not be used as the sole basis for treatment or other patient management decisions. A negative result may occur with  improper specimen collection/handling, submission of specimen other than nasopharyngeal swab, presence of viral mutation(s) within the areas targeted by this  assay, and inadequate number of viral copies (<131 copies/mL). A negative result must be combined with clinical observations, patient history, and epidemiological information. The expected result is Negative. Fact Sheet for Patients:  PinkCheek.be Fact Sheet for Healthcare Providers:  GravelBags.it This test is not yet ap proved or cleared by the [place] FDA and  has been authorized for detection and/or diagnosis of SARS-CoV-2 by FDA under an Emergency Use Authorization (EUA). This EUA will remain  in effect (meaning this test can be used) for the duration of the COVID-19 declaration under Section 564(b)(1) of the Act, 21 U.S.C. section 360bbb-3(b)(1), unless the authorization is terminated or revoked sooner.    Influenza A by PCR NEGATIVE NEGATIVE Final   Influenza B by PCR NEGATIVE NEGATIVE Final    Comment: (NOTE) The Xpert Xpress SARS-CoV-2/FLU/RSV assay is intended as an aid in  the diagnosis of influenza from Nasopharyngeal swab specimens and  should not be used as a sole basis for treatment. Nasal washings and  aspirates are unacceptable for Xpert Xpress SARS-CoV-2/FLU/RSV  testing. Fact Sheet for Patients: PinkCheek.be Fact Sheet for Healthcare Providers: GravelBags.it This test is not yet approved or cleared by the [place] FDA and  has been authorized for detection and/or diagnosis of SARS-CoV-2 by  FDA under an Emergency Use Authorization (EUA). This EUA will remain  in effect (meaning this test can be used) for the duration of the  Covid-19 declaration under Section 564(b)(1) of the Act, 21  U.S.C. section 360bbb-3(b)(1), unless the authorization is  terminated or revoked. Performed at [facility], [place] [address] [place], [place] [address]    Creatinine: Recent Labs    10/02/19 [id number]  CREATININE 1.79*    Impression/Assessment:   Gross hematuria BPH Urinary retention Urethral stricture  Plan:  Plan for direct visual internal urethrotomy, cystoscopy with clot evacuation and fulguration.  Long-term option includes embolization of the prostate versus simple prostatectomy.  He has historically no showed for outpatient appointments.  Marton Redwood, III 10/02/2019, 5:18 PM

## 2019-10-02 NOTE — Progress Notes (Signed)
Have to flush suprapubic catheter q1hr per Dr. Gloriann Loan.

## 2019-10-02 NOTE — Anesthesia Procedure Notes (Addendum)
Procedure Name: LMA Insertion Date/Time: 10/02/2019 10:14 PM Performed by: Jearld Pies, CRNA Pre-anesthesia Checklist: Patient identified, Emergency Drugs available, Suction available and Patient being monitored Patient Re-evaluated:Patient Re-evaluated prior to induction Oxygen Delivery Method: Circle System Utilized Preoxygenation: Pre-oxygenation with 100% oxygen Induction Type: IV induction Ventilation: Mask ventilation without difficulty LMA: LMA inserted LMA Size: 5.0 Number of attempts: 1 Airway Equipment and Method: Bite block Placement Confirmation: positive ETCO2 Tube secured with: Tape Dental Injury: Teeth and Oropharynx as per pre-operative assessment

## 2019-10-02 NOTE — H&P (Signed)
History and Physical    [name] [name]:[id number] DOB: 05/06/1946 DOA: 10/02/2019  PCP: [name], NP  Patient coming from: Home I have personally briefly reviewed patient's old medical records in [place]  Chief Complaint: Hematuria  HPI: [name] [name]. is a 74 y.o. male with medical history significant of hypertension, vision impairment, acute urinary retention due to BPH s/p suprapubic catheter presents to ER with hematuria.  Patient has history of longstanding indwelling Foley catheter. Pt tells me that he was hospitalized  in a [place] due to urinary retention &  They made several attempts to place a urethral catheter but ended up to place suprapubic catheter.  Patient presented to ER on April 23 as he was unable to treat his back with subsequent clot in the tubing-he underwent suprapubic catheter replacement with interventional radiology.  IR recommended follow-up in 4 to 6 weeks for catheter exchange and upsize.  Also recommended referral to IR to evaluate for possible prostatic artery embolization.  He was also evaluated by urology who recommended outpatient follow-up.  He presented to ER again on April 26 with similar symptoms-his bag exchange made and drained successfully and he discharged home in stable condition.  Patient tells me that he is unable to drain his bag again since 2 AM.  He has pain and numbness tingling sensation in his penis.  He also mentioned about his worsening leg swelling however denies association with shortness of breath, orthopnea, PND, chest pain.  He denies headache, blurry vision, lightheadedness, dizziness, fall, nausea, vomiting, diarrhea, abdominal pain, over-the-counter NSAID use.  He is not on any anticoagulants.  He has vision impairment however he is independent on daily life activities.  No history of smoking, alcohol, illicit drug use.   ED Course: Upon arrival to ED: Pilar Plate red blood noted in patient's urine bag.   Patient afebrile, vital signs stable.  WBC: 13.2.  H&H: 7.1/24.6, MCV: 70.5.  Sodium: 131, CMP shows AKI.  2 unit PRBC ordered.  EDP talk to IR who recommended medical admission and formal IR consult for prostatic artery embolization.  EDP also consulted urology.  [facility] hospitalist consulted for admission for acute blood loss anemia.  Review of Systems: As per HPI otherwise negative.    Past Medical History:  Diagnosis Date  . Blind   . Disorder of bone and cartilage, unspecified   . Edema   . Elevated prostate specific antigen (PSA)   . Hypertension   . Hypopotassemia   . Nonspecific abnormal results of liver function study   . Unspecified glaucoma(365.9)     Past Surgical History:  Procedure Laterality Date  . CYSTOSCOPY WITH FULGERATION N/A 01/17/2018   Procedure: CYSTOSCOPY CLOT EVACUATION WITH FULGERATION TUR BIOPSY OF BLADDER NECK;  Surgeon: Irine Seal, MD;  Location: [facility];  Service: Urology;  Laterality: N/A;  . EYE SURGERY Bilateral 2009   for glaucoma  . EYE SURGERY Left 2014  . HERNIA REPAIR Right 2003   Dr. Jeraldine Loots  . LAPAROSCOPIC APPENDECTOMY N/A 12/24/2012   Procedure:  LAPAROSCOPIC APPENDECTOMY ;  Surgeon: Pedro Earls, MD;  Location: [facility];  Service: General;  Laterality: N/A;  . TRANSURETHRAL RESECTION OF PROSTATE N/A 05/27/2019   Procedure: Cystoscopy Clot Evacuation and Fulgration;  Surgeon: Ardis Hughs, MD;  Location: [facility];  Service: Urology;  Laterality: N/A;     reports that he quit smoking about 51 years ago. His smoking use included cigarettes. He has never used smokeless tobacco. He  reports that he does not drink alcohol or use drugs.  No Known Allergies  Family History  Problem Relation Age of Onset  . Kidney failure Mother     Prior to Admission medications   Medication Sig Start Date End Date Taking? Authorizing Provider  Ascorbic Acid (VITAMIN C WITH ROSE HIPS) 1000 MG tablet Take 1,000 mg by mouth daily with lunch.   Yes  [provider]  aspirin-acetaminophen-caffeine (EXCEDRIN MIGRAINE) [phone] MG tablet Take 1 tablet by mouth daily as needed (pain).   Yes [provider]  brimonidine (ALPHAGAN) 0.2 % ophthalmic solution Place 1 drop into both eyes 2 (two) times daily. 03/11/19  Yes [provider]  ciprofloxacin (CIPRO) 500 MG tablet Take 500 mg by mouth daily.    Yes [provider]  dorzolamide-timolol (COSOPT) 22.3-6.8 MG/ML ophthalmic solution APPOINTMENT OVERDUE, instill 1 drop in both eyes twice daily Patient taking differently: Place 1 drop into both eyes 2 (two) times daily.  07/14/13  Yes [name], NP  folic acid (FOLVITE) Q000111Q MCG tablet Take 800 mcg by mouth daily with lunch.    Yes [provider]  hydrochlorothiazide (MICROZIDE) 12.5 MG capsule Take 1 capsule (12.5 mg total) by mouth daily. 07/03/19  Yes [name], NP  Magnesium 250 MG TABS Take 250 mg by mouth daily as needed (migraine headache).    Yes [provider]  metoprolol tartrate (LOPRESSOR) 25 MG tablet Take 12.5 mg by mouth 2 (two) times daily.   Yes [provider]  pilocarpine (PILOCAR) 2 % ophthalmic solution Place 1 drop into the right eye every 6 (six) hours.  12/25/17  Yes [provider]  potassium chloride SA (KLOR-CON) 20 MEQ tablet Take 1 tablet (20 mEq total) by mouth daily. 07/03/19  Yes [name], NP  pyridOXINE (VITAMIN B-6) 50 MG tablet Take 50 mg by mouth daily with lunch.   Yes [provider]  vitamin B-12 (CYANOCOBALAMIN) 1000 MCG tablet Take 1,000 mcg by mouth daily with lunch.   Yes [provider]    Physical Exam: Vitals:   10/02/19 1100 10/02/19 1115 10/02/19 1130 10/02/19 1200  BP: (!) 139/96  135/89 125/86  Pulse:  90 85 83  Resp:      Temp:      TempSrc:      SpO2:  100% 100% 100%  Weight:      Height:        Constitutional: NAD, calm, comfortable, not in acute distress, on room air,  appears pale and dehydrated. Eyes: PERRL, lids and conjunctivae: Pale  ENMT: Mucous membranes are moist. Posterior pharynx clear of any exudate or lesions.Normal dentition.  Neck: normal, supple, no masses, no thyromegaly Respiratory: clear to auscultation bilaterally, no wheezing, no crackles. Normal respiratory effort. No accessory muscle use.  Cardiovascular: Regular rate and rhythm, no murmurs / rubs / gallops.  Bilateral lower extremity pitting edema positive right more than left.. 2+ pedal pulses. No carotid bruits.  Abdomen: no tenderness, no masses palpated. No hepatosplenomegaly. Bowel sounds positive.  Musculoskeletal: no clubbing / cyanosis. No joint deformity upper and lower extremities. Good ROM, no contractures. Normal muscle tone.  Skin:    Neurologic: CN 2-12 grossly intact. Sensation intact, DTR normal. Strength 5/5 in all 4.  Psychiatric: Normal judgment and insight. Alert and oriented x 3. Normal mood.    Labs on Admission: I have personally reviewed following labs and imaging studies  CBC: Recent Labs  Lab 10/02/19 [phone]  WBC 13.2*  NEUTROABS 10.3*  HGB 7.1*  HCT 24.6*  MCV 70.5*  PLT [id number]   Basic Metabolic Panel: Recent Labs  Lab 10/02/19 0952  NA 131*  K 4.2  CL 97*  CO2 17*  GLUCOSE 139*  BUN 24*  CREATININE 1.79*  CALCIUM 8.6*   GFR: Estimated Creatinine Clearance: 39.2 mL/min (A) (by C-G formula based on SCr of 1.79 mg/dL (H)). Liver Function Tests: Recent Labs  Lab 10/02/19 0953  AST 46*  ALT 35  ALKPHOS 95  BILITOT 0.8  PROT 6.5  ALBUMIN 3.1*   No results for input(s): LIPASE, AMYLASE in the last 168 hours. No results for input(s): AMMONIA in the last 168 hours. Coagulation Profile: Recent Labs  Lab 10/02/19 0953  INR 1.1   Cardiac Enzymes: No results for input(s): CKTOTAL, CKMB, CKMBINDEX, TROPONINI in the last 168 hours. BNP (last 3 results) No results for input(s): PROBNP in the last 8760 hours. HbA1C: No results for  input(s): HGBA1C in the last 72 hours. CBG: No results for input(s): GLUCAP in the last 168 hours. Lipid Profile: No results for input(s): CHOL, HDL, LDLCALC, TRIG, CHOLHDL, LDLDIRECT in the last 72 hours. Thyroid Function Tests: No results for input(s): TSH, T4TOTAL, FREET4, T3FREE, THYROIDAB in the last 72 hours. Anemia Panel: No results for input(s): VITAMINB12, FOLATE, FERRITIN, TIBC, IRON, RETICCTPCT in the last 72 hours. Urine analysis:    Component Value Date/Time   COLORURINE YELLOW 08/23/2019 0818   APPEARANCEUR CLEAR 08/23/2019 0818   LABSPEC 1.010 08/23/2019 0818   PHURINE 6.0 08/23/2019 0818   GLUCOSEU NEGATIVE 08/23/2019 0818   HGBUR NEGATIVE 08/23/2019 0818   BILIRUBINUR NEGATIVE 08/23/2019 0818   KETONESUR NEGATIVE 08/23/2019 0818   PROTEINUR 30 (A) 08/23/2019 0818   UROBILINOGEN 0.2 12/27/2012 2241   NITRITE NEGATIVE 08/23/2019 0818   LEUKOCYTESUR LARGE (A) 08/23/2019 0818    Radiological Exams on Admission: No results found.  Assessment/Plan Principal Problem:   Anemia due to blood loss, acute Active Problems:   Hypertension   Hematuria   Acute blood loss anemia   Legally blind   AKI (acute kidney injury) (HCC)   BPH (benign prostatic hyperplasia)   Swelling of lower leg   Acute blood loss anemia: -Patient presented with gross hematuria.  H&H dropped from 10.6/37.7 to 7.1/24.6 in 10 days.   -Patient afebrile has leukocytosis of 13,000.  Vital signs stable. -2 units PRBC ordered by EDP -Admit patient to stepdown unit for close monitoring. -Transfuse 2 unit PRBC and monitor H&H closely. -EDP consulted urology and IR -Monitor vitals closely.  Hypertension: Stable -Continue metoprolol, hold HCTZ due to AKI.  Monitor blood pressure closely  AKI: Likely prerenal due to acute blood loss -Transfuse 2 units and repeat BMP tomorrow a.m.  Hold HCTZ for now.  Bilateral lower leg swelling: Right more than left -We will get Doppler ultrasound of bilateral  lower extremity to rule out DVT -No history of CHF. -Leg elevation.  Check TSH.  Acute urinary retention status post suprapubic catheter -Due to prostrate enlargement. -Urology consulted by EDP-await recommendations.   DVT prophylaxis: TED/SCD, no chemical anticoagulation due to active bleeding Code Status: Full code-confirmed with the patient Family Communication: None present at bedside.  Plan of care discussed with patient in length and he verbalized understanding and agreed with it. Disposition Plan: Likely home in 2 to 3 days Consults called: IR and urology by EDP  admission status: Inpatient   Mckinley Jewel MD [facility] Pager [phone]  If 7PM-7AM, please contact night-coverage [email or web address] Password [facility]  10/02/2019, 12:37 PM

## 2019-10-02 NOTE — Consult Note (Signed)
H&P Physician requesting consult: [name] [name]  Chief Complaint: Gross hematuria, urethral stricture  History of Present Illness: 74 year old male with a history of gross hematuria secondary to friable 600 g prostate.  He recently presented with urinary retention.  Cystoscopy by Dr. Lovena Neighbours revealed a pinpoint urethral stricture that was unable to be dilated at the bedside.  Therefore, he underwent 98 French suprapubic tube placement by interventional radiology.  He has now had a several day history of gross hematuria.  Hemoglobin is down to 7.1.  He is currently receiving blood.  Patient has no complaints currently.  Past Medical History:  Diagnosis Date  . Blind   . Disorder of bone and cartilage, unspecified   . Edema   . Elevated prostate specific antigen (PSA)   . Hypertension   . Hypopotassemia   . Nonspecific abnormal results of liver function study   . Unspecified glaucoma(365.9)    Past Surgical History:  Procedure Laterality Date  . CYSTOSCOPY WITH FULGERATION N/A 01/17/2018   Procedure: CYSTOSCOPY CLOT EVACUATION WITH FULGERATION TUR BIOPSY OF BLADDER NECK;  Surgeon: Irine Seal, MD;  Location: [facility];  Service: Urology;  Laterality: N/A;  . EYE SURGERY Bilateral 2009   for glaucoma  . EYE SURGERY Left 2014  . HERNIA REPAIR Right 2003   Dr. Jeraldine Loots  . LAPAROSCOPIC APPENDECTOMY N/A 12/24/2012   Procedure:  LAPAROSCOPIC APPENDECTOMY ;  Surgeon: Pedro Earls, MD;  Location: [facility];  Service: General;  Laterality: N/A;  . TRANSURETHRAL RESECTION OF PROSTATE N/A 05/27/2019   Procedure: Cystoscopy Clot Evacuation and Fulgration;  Surgeon: Ardis Hughs, MD;  Location: [facility];  Service: Urology;  Laterality: N/A;    Home Medications:  Medications Prior to Admission  Medication Sig Dispense Refill Last Dose  . Ascorbic Acid (VITAMIN C WITH ROSE HIPS) 1000 MG tablet Take 1,000 mg by mouth daily with lunch.   10/01/2019 at Unknown time  .  aspirin-acetaminophen-caffeine (EXCEDRIN MIGRAINE) 250-250-65 MG tablet Take 1 tablet by mouth daily as needed (pain).   Past Week at Unknown time  . brimonidine (ALPHAGAN) 0.2 % ophthalmic solution Place 1 drop into both eyes 2 (two) times daily.   10/01/2019 at Unknown time  . ciprofloxacin (CIPRO) 500 MG tablet Take 500 mg by mouth daily.    Past Week at Unknown time  . dorzolamide-timolol (COSOPT) 22.3-6.8 MG/ML ophthalmic solution APPOINTMENT OVERDUE, instill 1 drop in both eyes twice daily (Patient taking differently: Place 1 drop into both eyes 2 (two) times daily. ) 10 mL 0 10/01/2019 at Unknown time  . folic acid (FOLVITE) [id number] MCG tablet Take 800 mcg by mouth daily with lunch.    10/01/2019 at Unknown time  . hydrochlorothiazide (MICROZIDE) 12.5 MG capsule Take 1 capsule (12.5 mg total) by mouth daily. 90 capsule 1 10/01/2019 at Unknown time  . Magnesium 250 MG TABS Take 250 mg by mouth daily as needed (migraine headache).    Past Month at Unknown time  . metoprolol tartrate (LOPRESSOR) 25 MG tablet Take 12.5 mg by mouth 2 (two) times daily.   Past Week at Unknown time  . pilocarpine (PILOCAR) 2 % ophthalmic solution Place 1 drop into the right eye every 6 (six) hours.   0 10/01/2019 at Unknown time  . potassium chloride SA (KLOR-CON) 20 MEQ tablet Take 1 tablet (20 mEq total) by mouth daily. 90 tablet 1 Past Week at Unknown time  . pyridOXINE (VITAMIN B-6) 50 MG tablet Take 50 mg by mouth daily with  lunch.   Past Week at Unknown time  . vitamin B-12 (CYANOCOBALAMIN) 1000 MCG tablet Take 1,000 mcg by mouth daily with lunch.   Past Week at Unknown time   Allergies: No Known Allergies  Family History  Problem Relation Age of Onset  . Kidney failure Mother    Social History:  reports that he quit smoking about 51 years ago. His smoking use included cigarettes. He has never used smokeless tobacco. He reports that he does not drink alcohol or use drugs.  ROS: A complete review of systems was  performed.  All systems are negative except for pertinent findings as noted. ROS   Physical Exam:  Vital signs in last 24 hours: Temp:  [97.7 F (36.5 C)-98.8 F (37.1 C)] 98.3 F (36.8 C) (04/29 1619) Pulse Rate:  [83-101] 85 (04/29 1619) Resp:  [13-18] 16 (04/29 1619) BP: (111-158)/(78-109) 140/86 (04/29 1619) SpO2:  [99 %-100 %] 99 % (04/29 1619) Weight:  [86.2 kg] 86.2 kg (04/29 0328) General:  Alert and oriented, No acute distress HEENT: Normocephalic, atraumatic Neck: No JVD or lymphadenopathy Cardiovascular: Regular rate and rhythm Lungs: Regular rate and effort Abdomen: Soft, nontender, nondistended, no abdominal masses Genitourinary: Uncircumcised phallus.  He has a suprapubic tube in place.  Aspiration revealed moderate amount of clot.  Unable to irrigate a large amount. Back: No CVA tenderness Extremities: No edema Neurologic: Grossly intact  Laboratory Data:  Results for orders placed or performed during the hospital encounter of 10/02/19 (from the past 24 hour(s))  CBC with Differential     Status: Abnormal   Collection Time: 10/02/19  9:52 AM  Result Value Ref Range   WBC 13.2 (H) 4.0 - 10.5 K/uL   RBC 3.49 (L) 4.22 - 5.81 MIL/uL   Hemoglobin 7.1 (L) 13.0 - 17.0 g/dL   HCT 24.6 (L) 39.0 - 52.0 %   MCV 70.5 (L) 80.0 - 100.0 fL   MCH 20.3 (L) 26.0 - 34.0 pg   MCHC 28.9 (L) 30.0 - 36.0 g/dL   RDW 29.7 (H) 11.5 - 15.5 %   Platelets 361 150 - 400 K/uL   nRBC 0.0 0.0 - 0.2 %   Neutrophils Relative % 76 %   Neutro Abs 10.3 (H) 1.7 - 7.7 K/uL   Lymphocytes Relative 11 %   Lymphs Abs 1.4 0.7 - 4.0 K/uL   Monocytes Relative 11 %   Monocytes Absolute 1.4 (H) 0.1 - 1.0 K/uL   Eosinophils Relative 0 %   Eosinophils Absolute 0.0 0.0 - 0.5 K/uL   Basophils Relative 1 %   Basophils Absolute 0.1 0.0 - 0.1 K/uL   Immature Granulocytes 1 %   Abs Immature Granulocytes 0.08 (H) 0.00 - 0.07 K/uL   Polychromasia PRESENT   Basic metabolic panel     Status: Abnormal    Collection Time: 10/02/19  9:52 AM  Result Value Ref Range   Sodium 131 (L) 135 - 145 mmol/L   Potassium 4.2 3.5 - 5.1 mmol/L   Chloride 97 (L) 98 - 111 mmol/L   CO2 17 (L) 22 - 32 mmol/L   Glucose, Bld 139 (H) 70 - 99 mg/dL   BUN 24 (H) 8 - 23 mg/dL   Creatinine, Ser 1.79 (H) 0.61 - 1.24 mg/dL   Calcium 8.6 (L) 8.9 - 10.3 mg/dL   GFR calc non Af Amer 37 (L) >60 mL/min   GFR calc Af Amer 43 (L) >60 mL/min   Anion gap 17 (H) 5 - 15  Hepatic  function panel     Status: Abnormal   Collection Time: 10/02/19  9:53 AM  Result Value Ref Range   Total Protein 6.5 6.5 - 8.1 g/dL   Albumin 3.1 (L) 3.5 - 5.0 g/dL   AST 46 (H) 15 - 41 U/L   ALT 35 0 - 44 U/L   Alkaline Phosphatase 95 38 - 126 U/L   Total Bilirubin 0.8 0.3 - 1.2 mg/dL   Bilirubin, Direct 0.2 0.0 - 0.2 mg/dL   Indirect Bilirubin 0.6 0.3 - 0.9 mg/dL  Protime-INR     Status: None   Collection Time: 10/02/19  9:53 AM  Result Value Ref Range   Prothrombin Time 14.2 11.4 - 15.2 seconds   INR 1.1 0.8 - 1.2  Respiratory Panel by RT PCR (Flu A&B, Covid) - Nasopharyngeal Swab     Status: None   Collection Time: 10/02/19 12:08 PM   Specimen: Nasopharyngeal Swab  Result Value Ref Range   SARS Coronavirus 2 by RT PCR NEGATIVE NEGATIVE   Influenza A by PCR NEGATIVE NEGATIVE   Influenza B by PCR NEGATIVE NEGATIVE  Magnesium     Status: None   Collection Time: 10/02/19 12:36 PM  Result Value Ref Range   Magnesium 1.9 1.7 - 2.4 mg/dL  Phosphorus     Status: Abnormal   Collection Time: 10/02/19 12:36 PM  Result Value Ref Range   Phosphorus 5.0 (H) 2.5 - 4.6 mg/dL  Iron and TIBC     Status: Abnormal   Collection Time: 10/02/19 12:36 PM  Result Value Ref Range   Iron 19 (L) 45 - 182 ug/dL   TIBC 477 (H) 250 - 450 ug/dL   Saturation Ratios 4 (L) 17.9 - 39.5 %   UIBC 458 ug/dL  Ferritin     Status: Abnormal   Collection Time: 10/02/19 12:36 PM  Result Value Ref Range   Ferritin 21 (L) 24 - 336 ng/mL  TSH     Status: None    Collection Time: 10/02/19 12:36 PM  Result Value Ref Range   TSH 2.205 0.350 - 4.500 uIU/mL  Type and screen [place]     Status: None (Preliminary result)   Collection Time: 10/02/19  1:40 PM  Result Value Ref Range   ABO/RH(D) B POS    Antibody Screen NEG    Sample Expiration 10/05/2019,2359    Unit Number [id number]    Blood Component Type RED CELLS,LR    Unit division 00    Status of Unit ALLOCATED    Transfusion Status OK TO TRANSFUSE    Crossmatch Result Compatible    Unit Number [id number]    Blood Component Type RED CELLS,LR    Unit division 00    Status of Unit ISSUED    Transfusion Status OK TO TRANSFUSE    Crossmatch Result      Compatible Performed at [facility], [address] [place], [place] [address]   Prepare RBC (crossmatch)     Status: None   Collection Time: 10/02/19  1:40 PM  Result Value Ref Range   Order Confirmation      ORDER PROCESSED BY BLOOD BANK Performed at [facility], [place] [address], [place], [place] [address]    Recent Results (from the past 240 hour(s))  Respiratory Panel by RT PCR (Flu A&B, Covid) - Nasopharyngeal Swab     Status: None   Collection Time: 10/02/19 12:08 PM   Specimen: Nasopharyngeal Swab  Result Value  Ref Range Status   SARS Coronavirus 2 by RT PCR NEGATIVE NEGATIVE Final    Comment: (NOTE) SARS-CoV-2 target nucleic acids are NOT DETECTED. The SARS-CoV-2 RNA is generally detectable in upper respiratoy specimens during the acute phase of infection. The lowest concentration of SARS-CoV-2 viral copies this assay can detect is 131 copies/mL. A negative result does not preclude SARS-Cov-2 infection and should not be used as the sole basis for treatment or other patient management decisions. A negative result may occur with  improper specimen collection/handling, submission of specimen other than nasopharyngeal swab, presence of viral mutation(s) within the areas targeted by this  assay, and inadequate number of viral copies (<131 copies/mL). A negative result must be combined with clinical observations, patient history, and epidemiological information. The expected result is Negative. Fact Sheet for Patients:  PinkCheek.be Fact Sheet for Healthcare Providers:  GravelBags.it This test is not yet ap proved or cleared by the [place] FDA and  has been authorized for detection and/or diagnosis of SARS-CoV-2 by FDA under an Emergency Use Authorization (EUA). This EUA will remain  in effect (meaning this test can be used) for the duration of the COVID-19 declaration under Section 564(b)(1) of the Act, 21 U.S.C. section 360bbb-3(b)(1), unless the authorization is terminated or revoked sooner.    Influenza A by PCR NEGATIVE NEGATIVE Final   Influenza B by PCR NEGATIVE NEGATIVE Final    Comment: (NOTE) The Xpert Xpress SARS-CoV-2/FLU/RSV assay is intended as an aid in  the diagnosis of influenza from Nasopharyngeal swab specimens and  should not be used as a sole basis for treatment. Nasal washings and  aspirates are unacceptable for Xpert Xpress SARS-CoV-2/FLU/RSV  testing. Fact Sheet for Patients: PinkCheek.be Fact Sheet for Healthcare Providers: GravelBags.it This test is not yet approved or cleared by the [place] FDA and  has been authorized for detection and/or diagnosis of SARS-CoV-2 by  FDA under an Emergency Use Authorization (EUA). This EUA will remain  in effect (meaning this test can be used) for the duration of the  Covid-19 declaration under Section 564(b)(1) of the Act, 21  U.S.C. section 360bbb-3(b)(1), unless the authorization is  terminated or revoked. Performed at [facility], [place] [address], [place], [place] [address]    Creatinine: Recent Labs    10/02/19 [id number]  CREATININE 1.79*    Impression/Assessment:   Gross hematuria BPH Urinary retention Urethral stricture  Plan:  Plan for direct visual internal urethrotomy, cystoscopy with clot evacuation and fulguration.  Long-term option includes embolization of the prostate versus simple prostatectomy.  He has historically no showed for outpatient appointments.  [name] [name], III 10/02/2019, 5:18 PM

## 2019-10-02 NOTE — ED Notes (Signed)
Pt requesting to speak with doctor. Doctor notified.

## 2019-10-02 NOTE — ED Triage Notes (Signed)
EMS reports problems w/ suprapubic catheter.  Pt states there continues to be blood in bag and he is having pain in his penis described as spasms.

## 2019-10-02 NOTE — Plan of Care (Signed)

## 2019-10-02 NOTE — ED Notes (Signed)
Pt thinks hes not receiving "real" blood. Pt re-educated. Wants to hear it from doctor. Provider notified. MD at bedside.

## 2019-10-02 NOTE — Anesthesia Preprocedure Evaluation (Signed)
Anesthesia Evaluation  Patient identified by MRN, date of birth, ID band Patient awake    Reviewed: Allergy & Precautions, NPO status , Patient's Chart, lab work & pertinent test results  Airway Mallampati: II  TM Distance: >3 FB Neck ROM: Full    Dental  (+) Dental Advisory Given   Pulmonary former smoker,    breath sounds clear to auscultation       Cardiovascular hypertension, Pt. on medications and Pt. on home beta blockers  Rhythm:Regular Rate:Normal     Neuro/Psych negative neurological ROS     GI/Hepatic negative GI ROS, Neg liver ROS,   Endo/Other  negative endocrine ROS  Renal/GU ARFRenal disease     Musculoskeletal   Abdominal   Peds  Hematology  (+) anemia ,   Anesthesia Other Findings   Reproductive/Obstetrics                             Lab Results  Component Value Date   WBC 13.2 (H) 10/02/2019   HGB 7.1 (L) 10/02/2019   HCT 24.6 (L) 10/02/2019   MCV 70.5 (L) 10/02/2019   PLT 361 10/02/2019   Lab Results  Component Value Date   CREATININE 1.79 (H) 10/02/2019   BUN 24 (H) 10/02/2019   NA 131 (L) 10/02/2019   K 4.2 10/02/2019   CL 97 (L) 10/02/2019   CO2 17 (L) 10/02/2019    Anesthesia Physical Anesthesia Plan  ASA: III and emergent  Anesthesia Plan: General   Post-op Pain Management:    Induction: Intravenous  PONV Risk Score and Plan: 2 and Dexamethasone, Ondansetron and Treatment may vary due to age or medical condition  Airway Management Planned: Oral ETT and LMA  Additional Equipment:   Intra-op Plan:   Post-operative Plan: Extubation in OR  Informed Consent: I have reviewed the patients History and Physical, chart, labs and discussed the procedure including the risks, benefits and alternatives for the proposed anesthesia with the patient or authorized representative who has indicated his/her understanding and acceptance.     Dental advisory  given  Plan Discussed with: CRNA  Anesthesia Plan Comments:         Anesthesia Quick Evaluation

## 2019-10-03 ENCOUNTER — Inpatient Hospital Stay (HOSPITAL_COMMUNITY): Payer: Medicare Other

## 2019-10-03 DIAGNOSIS — R609 Edema, unspecified: Secondary | ICD-10-CM

## 2019-10-03 DIAGNOSIS — D62 Acute posthemorrhagic anemia: Secondary | ICD-10-CM

## 2019-10-03 HISTORY — PX: IR US GUIDE VASC ACCESS RIGHT: IMG2390

## 2019-10-03 HISTORY — PX: IR ANGIOGRAM EXTREMITY RIGHT: IMG652

## 2019-10-03 HISTORY — PX: IR ANGIOGRAM SELECTIVE EACH ADDITIONAL VESSEL: IMG667

## 2019-10-03 HISTORY — PX: IR EMBO ART  VEN HEMORR LYMPH EXTRAV  INC GUIDE ROADMAPPING: IMG5450

## 2019-10-03 HISTORY — PX: IR ANGIOGRAM PELVIS SELECTIVE OR SUPRASELECTIVE: IMG661

## 2019-10-03 LAB — GLUCOSE, CAPILLARY
Glucose-Capillary: 137 mg/dL — ABNORMAL HIGH (ref 70–99)
Glucose-Capillary: 113 mg/dL — ABNORMAL HIGH (ref 70–99)
Glucose-Capillary: 109 mg/dL — ABNORMAL HIGH (ref 70–99)
Glucose-Capillary: 119 mg/dL — ABNORMAL HIGH (ref 70–99)
Glucose-Capillary: 128 mg/dL — ABNORMAL HIGH (ref 70–99)

## 2019-10-03 LAB — POCT I-STAT 7, (LYTES, BLD GAS, ICA,H+H)
Acid-Base Excess: 4 mmol/L — ABNORMAL HIGH (ref 0.0–2.0)
Acid-Base Excess: 1 mmol/L (ref 0.0–2.0)
Acid-Base Excess: 1 mmol/L (ref 0.0–2.0)
Acid-Base Excess: 0 mmol/L (ref 0.0–2.0)
Acid-Base Excess: 3 mmol/L — ABNORMAL HIGH (ref 0.0–2.0)
Bicarbonate: 27.4 mmol/L (ref 20.0–28.0)
Bicarbonate: 25.7 mmol/L (ref 20.0–28.0)
Bicarbonate: 26.1 mmol/L (ref 20.0–28.0)
Bicarbonate: 24.7 mmol/L (ref 20.0–28.0)
Bicarbonate: 26.2 mmol/L (ref 20.0–28.0)
Calcium, Ion: 1.18 mmol/L (ref 1.15–1.40)
Calcium, Ion: 0.85 mmol/L — CL (ref 1.15–1.40)
Calcium, Ion: 1.02 mmol/L — ABNORMAL LOW (ref 1.15–1.40)
Calcium, Ion: 1.2 mmol/L (ref 1.15–1.40)
Calcium, Ion: 1.17 mmol/L (ref 1.15–1.40)
HCT: 26 % — ABNORMAL LOW (ref 39.0–52.0)
HCT: 25 % — ABNORMAL LOW (ref 39.0–52.0)
HCT: 27 % — ABNORMAL LOW (ref 39.0–52.0)
HCT: 31 % — ABNORMAL LOW (ref 39.0–52.0)
HCT: 26 % — ABNORMAL LOW (ref 39.0–52.0)
Hemoglobin: 8.8 g/dL — ABNORMAL LOW (ref 13.0–17.0)
Hemoglobin: 8.5 g/dL — ABNORMAL LOW (ref 13.0–17.0)
Hemoglobin: 9.2 g/dL — ABNORMAL LOW (ref 13.0–17.0)
Hemoglobin: 10.5 g/dL — ABNORMAL LOW (ref 13.0–17.0)
Hemoglobin: 8.8 g/dL — ABNORMAL LOW (ref 13.0–17.0)
O2 Saturation: 100 %
O2 Saturation: 100 %
O2 Saturation: 100 %
O2 Saturation: 100 %
O2 Saturation: 100 %
Patient temperature: 94
Patient temperature: 34.1
Patient temperature: 34.4
Patient temperature: 35.1
Patient temperature: 33.3
Potassium: 3.7 mmol/L (ref 3.5–5.1)
Potassium: 3.8 mmol/L (ref 3.5–5.1)
Potassium: 4.1 mmol/L (ref 3.5–5.1)
Potassium: 4 mmol/L (ref 3.5–5.1)
Potassium: 3.8 mmol/L (ref 3.5–5.1)
Sodium: 134 mmol/L — ABNORMAL LOW (ref 135–145)
Sodium: 133 mmol/L — ABNORMAL LOW (ref 135–145)
Sodium: 134 mmol/L — ABNORMAL LOW (ref 135–145)
Sodium: 134 mmol/L — ABNORMAL LOW (ref 135–145)
Sodium: 132 mmol/L — ABNORMAL LOW (ref 135–145)
TCO2: 28 mmol/L (ref 22–32)
TCO2: 27 mmol/L (ref 22–32)
TCO2: 27 mmol/L (ref 22–32)
TCO2: 26 mmol/L (ref 22–32)
TCO2: 27 mmol/L (ref 22–32)
pCO2 arterial: 32.5 mmHg (ref 32.0–48.0)
pCO2 arterial: 36 mmHg (ref 32.0–48.0)
pCO2 arterial: 37.2 mmHg (ref 32.0–48.0)
pCO2 arterial: 36.4 mmHg (ref 32.0–48.0)
pCO2 arterial: 29.6 mmHg — ABNORMAL LOW (ref 32.0–48.0)
pH, Arterial: 7.525 — ABNORMAL HIGH (ref 7.350–7.450)
pH, Arterial: 7.435 (ref 7.350–7.450)
pH, Arterial: 7.457 — ABNORMAL HIGH (ref 7.350–7.450)
pH, Arterial: 7.43 (ref 7.350–7.450)
pH, Arterial: 7.542 — ABNORMAL HIGH (ref 7.350–7.450)
pO2, Arterial: 589 mmHg — ABNORMAL HIGH (ref 83.0–108.0)
pO2, Arterial: 567 mmHg — ABNORMAL HIGH (ref 83.0–108.0)
pO2, Arterial: 611 mmHg — ABNORMAL HIGH (ref 83.0–108.0)
pO2, Arterial: 181 mmHg — ABNORMAL HIGH (ref 83.0–108.0)
pO2, Arterial: 479 mmHg — ABNORMAL HIGH (ref 83.0–108.0)

## 2019-10-03 LAB — COMPREHENSIVE METABOLIC PANEL
ALT: 24 U/L (ref 0–44)
AST: 29 U/L (ref 15–41)
Albumin: 2.2 g/dL — ABNORMAL LOW (ref 3.5–5.0)
Alkaline Phosphatase: 58 U/L (ref 38–126)
Anion gap: 10 (ref 5–15)
BUN: 25 mg/dL — ABNORMAL HIGH (ref 8–23)
CO2: 22 mmol/L (ref 22–32)
Calcium: 8.4 mg/dL — ABNORMAL LOW (ref 8.9–10.3)
Chloride: 102 mmol/L (ref 98–111)
Creatinine, Ser: 1.34 mg/dL — ABNORMAL HIGH (ref 0.61–1.24)
GFR calc Af Amer: >60 mL/min (ref >60)
GFR calc non Af Amer: 52 mL/min — ABNORMAL LOW (ref >60)
Glucose, Bld: 159 mg/dL — ABNORMAL HIGH (ref 70–99)
Potassium: 3.7 mmol/L (ref 3.5–5.1)
Sodium: 134 mmol/L — ABNORMAL LOW (ref 135–145)
Total Bilirubin: 1.4 mg/dL — ABNORMAL HIGH (ref 0.3–1.2)
Total Protein: 4.7 g/dL — ABNORMAL LOW (ref 6.5–8.1)

## 2019-10-03 LAB — DIC (DISSEMINATED INTRAVASCULAR COAGULATION)PANEL
D-Dimer, Quant: 0.29 ug/mL-FEU (ref 0.00–0.50)
Fibrinogen: 220 mg/dL (ref 210–475)
INR: 1.2 (ref 0.8–1.2)
Platelets: 161 10*3/uL (ref 150–400)
Prothrombin Time: 14.9 seconds (ref 11.4–15.2)
Smear Review: NONE SEEN
aPTT: 30 seconds (ref 24–36)

## 2019-10-03 LAB — BASIC METABOLIC PANEL
Anion gap: 8 (ref 5–15)
BUN: 25 mg/dL — ABNORMAL HIGH (ref 8–23)
CO2: 23 mmol/L (ref 22–32)
Calcium: 7.9 mg/dL — ABNORMAL LOW (ref 8.9–10.3)
Chloride: 105 mmol/L (ref 98–111)
Creatinine, Ser: 1.27 mg/dL — ABNORMAL HIGH (ref 0.61–1.24)
GFR calc Af Amer: >60 mL/min (ref >60)
GFR calc non Af Amer: 56 mL/min — ABNORMAL LOW (ref >60)
Glucose, Bld: 162 mg/dL — ABNORMAL HIGH (ref 70–99)
Potassium: 4.1 mmol/L (ref 3.5–5.1)
Sodium: 136 mmol/L (ref 135–145)

## 2019-10-03 LAB — POCT I-STAT EG7
Acid-Base Excess: 2 mmol/L (ref 0.0–2.0)
Bicarbonate: 25.7 mmol/L (ref 20.0–28.0)
Calcium, Ion: 1.09 mmol/L — ABNORMAL LOW (ref 1.15–1.40)
HCT: 21 % — ABNORMAL LOW (ref 39.0–52.0)
Hemoglobin: 7.1 g/dL — ABNORMAL LOW (ref 13.0–17.0)
O2 Saturation: 100 %
Potassium: 3.8 mmol/L (ref 3.5–5.1)
Sodium: 133 mmol/L — ABNORMAL LOW (ref 135–145)
TCO2: 27 mmol/L (ref 22–32)
pCO2, Ven: 36.8 mmHg — ABNORMAL LOW (ref 44.0–60.0)
pH, Ven: 7.452 — ABNORMAL HIGH (ref 7.250–7.430)
pO2, Ven: 249 mmHg — ABNORMAL HIGH (ref 32.0–45.0)

## 2019-10-03 LAB — CBC
HCT: 28.1 % — ABNORMAL LOW (ref 39.0–52.0)
Hemoglobin: 9.2 g/dL — ABNORMAL LOW (ref 13.0–17.0)
MCH: 25.8 pg — ABNORMAL LOW (ref 26.0–34.0)
MCHC: 32.7 g/dL (ref 30.0–36.0)
MCV: 78.7 fL — ABNORMAL LOW (ref 80.0–100.0)
Platelets: 160 10*3/uL (ref 150–400)
RBC: 3.57 MIL/uL — ABNORMAL LOW (ref 4.22–5.81)
RDW: 20.7 % — ABNORMAL HIGH (ref 11.5–15.5)
WBC: 8.3 10*3/uL (ref 4.0–10.5)
nRBC: 0 % (ref 0.0–0.2)

## 2019-10-03 LAB — MAGNESIUM: Magnesium: 1.9 mg/dL (ref 1.7–2.4)

## 2019-10-03 LAB — HEMOGLOBIN AND HEMATOCRIT, BLOOD
HCT: 27.5 % — ABNORMAL LOW (ref 39.0–52.0)
HCT: 26.3 % — ABNORMAL LOW (ref 39.0–52.0)
Hemoglobin: 9 g/dL — ABNORMAL LOW (ref 13.0–17.0)
Hemoglobin: 8.5 g/dL — ABNORMAL LOW (ref 13.0–17.0)

## 2019-10-03 MED ORDER — SODIUM CHLORIDE 0.9 % IV SOLN: INTRAVENOUS | Status: DC | PRN

## 2019-10-03 MED ORDER — PROPOFOL 500 MG/50ML IV EMUL
INTRAVENOUS | Status: DC | PRN
  Administered 2019-10-03: 50 ug/kg/min via INTRAVENOUS

## 2019-10-03 MED ORDER — CEFAZOLIN SODIUM-DEXTROSE 2-4 GM/100ML-% IV SOLN
INTRAVENOUS | Status: AC
  Filled 2019-10-03: qty 100

## 2019-10-03 MED ORDER — ORAL CARE MOUTH RINSE
15.0000 mL | OROMUCOSAL | Status: DC
  Administered 2019-10-03 – 2019-10-04 (×16): 15 mL via OROMUCOSAL

## 2019-10-03 MED ORDER — METOPROLOL TARTRATE 5 MG/5ML IV SOLN: 2.5000 mg | INTRAVENOUS | Status: DC | PRN

## 2019-10-03 MED ORDER — POLYETHYLENE GLYCOL 3350 17 G PO PACK: 17.0000 g | PACK | Freq: Every day | ORAL | Status: DC

## 2019-10-03 MED ORDER — ROCURONIUM 10MG/ML (10ML) SYRINGE FOR MEDFUSION PUMP - OPTIME
INTRAVENOUS | Status: DC | PRN
  Administered 2019-10-03: 50 mg via INTRAVENOUS
  Administered 2019-10-03: 20 mg via INTRAVENOUS
  Administered 2019-10-03 (×2): 50 mg via INTRAVENOUS

## 2019-10-03 MED ORDER — METOPROLOL TARTRATE 12.5 MG HALF TABLET: 12.5000 mg | ORAL_TABLET | Freq: Two times a day (BID) | ORAL | Status: DC

## 2019-10-03 MED ORDER — PHENYLEPHRINE HCL-NACL 10-0.9 MG/250ML-% IV SOLN
0.0000 ug/min | INTRAVENOUS | Status: DC
  Administered 2019-10-03 – 2019-10-04 (×2): 20 ug/min via INTRAVENOUS
  Filled 2019-10-03: qty 250

## 2019-10-03 MED ORDER — FENTANYL CITRATE (PF) 100 MCG/2ML IJ SOLN
25.0000 ug | INTRAMUSCULAR | Status: DC | PRN
  Administered 2019-10-03: 12:00:00 50 ug via INTRAVENOUS
  Administered 2019-10-03: 15:00:00 100 ug via INTRAVENOUS
  Administered 2019-10-03 (×2): 50 ug via INTRAVENOUS
  Filled 2019-10-03 (×3): qty 2

## 2019-10-03 MED ORDER — ASCORBIC ACID 500 MG PO TABS
1000.0000 mg | ORAL_TABLET | Freq: Every day | ORAL | Status: DC
  Administered 2019-10-03: 1000 mg
  Filled 2019-10-03: qty 2

## 2019-10-03 MED ORDER — POLYETHYLENE GLYCOL 3350 17 G PO PACK
17.0000 g | PACK | Freq: Every day | ORAL | Status: DC
  Filled 2019-10-03: qty 1

## 2019-10-03 MED ORDER — MICROFIBRILLAR COLL HEMOSTAT EX PADS
MEDICATED_PAD | CUTANEOUS | Status: DC | PRN
  Administered 2019-10-03: 1 via TOPICAL

## 2019-10-03 MED ORDER — VITAL HIGH PROTEIN PO LIQD
1000.0000 mL | ORAL | Status: DC
  Administered 2019-10-03: 1000 mL

## 2019-10-03 MED ORDER — NITROGLYCERIN 1 MG/10 ML FOR IR/CATH LAB
INTRA_ARTERIAL | Status: AC
  Filled 2019-10-03: qty 10

## 2019-10-03 MED ORDER — PROPOFOL 1000 MG/100ML IV EMUL
0.0000 ug/kg/min | INTRAVENOUS | Status: DC
  Administered 2019-10-03: 05:00:00 40 ug/kg/min via INTRAVENOUS
  Administered 2019-10-03: 08:00:00 20 ug/kg/min via INTRAVENOUS
  Administered 2019-10-03: 15:00:00 50 ug/kg/min via INTRAVENOUS
  Filled 2019-10-03 (×2): qty 100

## 2019-10-03 MED ORDER — VITAMIN B-6 100 MG PO TABS
50.0000 mg | ORAL_TABLET | Freq: Every day | ORAL | Status: DC
  Administered 2019-10-03: 50 mg
  Filled 2019-10-03: qty 1

## 2019-10-03 MED ORDER — FENTANYL CITRATE (PF) 100 MCG/2ML IJ SOLN: 25.0000 ug | INTRAMUSCULAR | Status: DC | PRN

## 2019-10-03 MED ORDER — IOHEXOL 300 MG/ML  SOLN
150.0000 mL | Freq: Once | INTRAMUSCULAR | Status: AC | PRN
  Administered 2019-10-03: 04:00:00 80 mL via INTRA_ARTERIAL

## 2019-10-03 MED ORDER — DOCUSATE SODIUM 50 MG/5ML PO LIQD
100.0000 mg | Freq: Two times a day (BID) | ORAL | Status: DC
  Filled 2019-10-03: qty 10

## 2019-10-03 MED ORDER — CHLORHEXIDINE GLUCONATE CLOTH 2 % EX PADS
6.0000 | MEDICATED_PAD | Freq: Every day | CUTANEOUS | Status: DC
  Administered 2019-10-03 – 2019-10-08 (×6): 6 via TOPICAL

## 2019-10-03 MED ORDER — ALBUMIN HUMAN 5 % IV SOLN
12.5000 g | Freq: Once | INTRAVENOUS | Status: AC
  Administered 2019-10-03: 21:00:00 12.5 g via INTRAVENOUS
  Filled 2019-10-03: qty 250

## 2019-10-03 MED ORDER — FENTANYL CITRATE (PF) 250 MCG/5ML IJ SOLN
INTRAMUSCULAR | Status: AC
  Filled 2019-10-03: qty 5

## 2019-10-03 MED ORDER — PRO-STAT SUGAR FREE PO LIQD
30.0000 mL | Freq: Four times a day (QID) | ORAL | Status: DC
  Administered 2019-10-03 (×2): 30 mL
  Filled 2019-10-03 (×2): qty 30

## 2019-10-03 MED ORDER — LACTATED RINGERS IV SOLN: INTRAVENOUS | Status: DC | PRN

## 2019-10-03 MED ORDER — FOLIC ACID 1 MG PO TABS
1.0000 mg | ORAL_TABLET | Freq: Every day | ORAL | Status: DC
  Administered 2019-10-03: 11:00:00 1 mg
  Filled 2019-10-03: qty 1

## 2019-10-03 MED ORDER — VITAMIN B-12 1000 MCG PO TABS
1000.0000 ug | ORAL_TABLET | Freq: Every day | ORAL | Status: DC
  Administered 2019-10-03: 11:00:00 1000 ug
  Filled 2019-10-03 (×2): qty 1

## 2019-10-03 MED ORDER — DEXMEDETOMIDINE HCL IN NACL 400 MCG/100ML IV SOLN
0.4000 ug/kg/h | INTRAVENOUS | Status: DC
  Administered 2019-10-03 (×2): 0.4 ug/kg/h via INTRAVENOUS
  Administered 2019-10-04: 0.7 ug/kg/h via INTRAVENOUS
  Filled 2019-10-03 (×3): qty 100

## 2019-10-03 MED ORDER — DOCUSATE SODIUM 50 MG/5ML PO LIQD
100.0000 mg | Freq: Two times a day (BID) | ORAL | Status: DC
  Administered 2019-10-03 (×2): 100 mg
  Filled 2019-10-03: qty 10

## 2019-10-03 MED ORDER — CHLORHEXIDINE GLUCONATE 0.12% ORAL RINSE (MEDLINE KIT)
15.0000 mL | Freq: Two times a day (BID) | OROMUCOSAL | Status: DC
  Administered 2019-10-03 – 2019-10-07 (×5): 15 mL via OROMUCOSAL

## 2019-10-03 MED ORDER — CALCIUM CHLORIDE 10 % IV SOLN
INTRAVENOUS | Status: DC | PRN
  Administered 2019-10-03 (×3): 100 mg via INTRAVENOUS
  Administered 2019-10-03: 400 mg via INTRAVENOUS
  Administered 2019-10-03: 200 mg via INTRAVENOUS
  Administered 2019-10-03: 100 mg via INTRAVENOUS

## 2019-10-03 MED ORDER — FENTANYL CITRATE (PF) 100 MCG/2ML IJ SOLN
INTRAMUSCULAR | Status: AC
  Administered 2019-10-03: 05:00:00 100 ug via INTRAVENOUS
  Filled 2019-10-03: qty 2

## 2019-10-03 NOTE — Progress Notes (Signed)
Bilateral lower extremity venous duplex completed. Refer to "CV Proc" under chart review to view preliminary results.  10/03/2019 1:19 PM Kelby Aline., MHA, RVT, RDCS, RDMS

## 2019-10-03 NOTE — Progress Notes (Signed)
Initial Nutrition Assessment RD working remotely.  DOCUMENTATION CODES:   Not applicable  INTERVENTION:    Vital High Protein at 30 ml/h (720 ml per day)   Pro-stat 30 ml QID   Provides 1120 kcal (1804 kcal total with propofol), 123 gm protein, 602 ml free water daily  NUTRITION DIAGNOSIS:   Inadequate oral intake related to inability to eat as evidenced by NPO status.  GOAL:   Patient will meet greater than or equal to 90% of their needs  MONITOR:   Vent status, TF tolerance, Skin, Labs  REASON FOR ASSESSMENT:   Ventilator, Consult Enteral/tube feeding initiation and management  ASSESSMENT:   74 yo male admitted with hematuria, ABLA. S/P ex lap with extraction of > 1 L blood clot from the bladder, then required bilateral prostatic artery embolization. PMH includes chronic indwelling foley since 2014 with recent placement of suprapubic catheter, BPH, HTN, vision impairment.   Received MD Consult for TF initiation and management. NG tube in place.    Patient is currently intubated on ventilator support MV: 7.1 L/min Temp (24hrs), Avg:96.8 F (36 C), Min:92.8 F (33.8 C), Max:98.8 F (37.1 C)  Propofol: 25.9 ml/hr providing 684 kcal from lipid.  Labs reviewed. Na 134 (L) CBG's: 137-113-109  Medications reviewed and include vitamin C, vitamin B-6, vitamin 0000000, miralax, folic acid, colace.  Usual weights reviewed. Patient has had a 5% weight loss over the past 3 months, not significant for the time frame.   NUTRITION - FOCUSED PHYSICAL EXAM:  unable to complete  Diet Order:   Diet Order            Diet NPO time specified  Diet effective now              EDUCATION NEEDS:   No education needs have been identified at this time  Skin:  Skin Assessment: Reviewed RN Assessment  Last BM:  no BM documented  Height:   Ht Readings from Last 1 Encounters:  10/02/19 5\' 8"  (1.727 m)    Weight:   Wt Readings from Last 1 Encounters:  10/02/19 86.2  kg    Ideal Body Weight:  70 kg  BMI:  Body mass index is 28.89 kg/m.  Estimated Nutritional Needs:   Kcal:  1730  Protein:  120-130 gm  Fluid:  >/= 2 L    Molli Barrows, RD, LDN, CNSC Please refer to Amion for contact information.

## 2019-10-03 NOTE — Interval H&P Note (Signed)
History and Physical Interval Note:  10/03/2019 2:18 AM  [name] Ast.  has presented today for surgery, with the diagnosis of CATHETER SWELLING.  The various methods of treatment have been discussed with the patient and family. After consideration of risks, benefits and other options for treatment, the patient has consented to  Procedure(s): CYSTOSCOPY WITH DIRECT VISION INTERNAL URETHROTOMY WITH CLOT EVACUATION retrograd urethralgram Coverted to Open. (N/A) Open Cystostomy Suprapubic tube placement and JP drain and urethral dilation (N/A) as a surgical intervention.  The patient's history has been reviewed, patient examined, no change in status, stable for surgery.  I have reviewed the patient's chart and labs.  Questions were answered to the patient's satisfaction.     Marton Redwood, III

## 2019-10-03 NOTE — Anesthesia Procedure Notes (Signed)
Procedure Name: Intubation Date/Time: 10/03/2019 12:35 AM Performed by: Jearld Pies, CRNA Pre-anesthesia Checklist: Patient identified, Emergency Drugs available, Suction available and Patient being monitored Patient Re-evaluated:Patient Re-evaluated prior to induction Oxygen Delivery Method: Circle System Utilized Preoxygenation: Pre-oxygenation with 100% oxygen Induction Type: IV induction and Cricoid Pressure applied Laryngoscope Size: Glidescope and 4 Grade View: Grade I Tube type: Oral Tube size: 7.5 mm Number of attempts: 1 Airway Equipment and Method: Stylet and Video-laryngoscopy Placement Confirmation: ETT inserted through vocal cords under direct vision,  positive ETCO2 and breath sounds checked- equal and bilateral Secured at: 23 cm Tube secured with: Tape Dental Injury: Teeth and Oropharynx as per pre-operative assessment

## 2019-10-03 NOTE — Procedures (Signed)
Interventional Radiology Procedure Note  Procedure: Bilateral prostate artery embolization  Complications: None  Estimated Blood Loss: None  Recommendations: - To ICU - Right leg straight x 3 hrs - Trend H&H, transfuse as needed  Signed,  Criselda Peaches, MD

## 2019-10-03 NOTE — OR Nursing (Signed)
Pt to be transported to IR for third procedure

## 2019-10-03 NOTE — Anesthesia Procedure Notes (Signed)
Arterial Line Insertion Start/End4/30/2021 12:32 AM, 10/03/2019 12:33 AM Performed by: Jearld Pies, CRNA, CRNA  Patient location: OR. Emergency situation Patient sedated Right, radial was placed Catheter size: 20 G Hand hygiene performed  and Seldinger technique used Allen's test indicative of satisfactory collateral circulation Attempts: 1 Procedure performed without using ultrasound guided technique. Following insertion, dressing applied and Biopatch. Post procedure assessment: normal  Patient tolerated the procedure well with no immediate complications.

## 2019-10-03 NOTE — Op Note (Addendum)
Operative Note  Preoperative diagnosis:  1.  Bulbar urethral stricture 2.  Gross hematuria 3.  BPH  Postoperative diagnosis: Same  Procedure(s): 1.  Cystoscopy with attempted direct visual internal ureterotomy 2.  Retrograde urethrogram  3.  Exploratory laparotomy with bladder clot evacuation and open suprapubic tube placement 4.  Urethral dilation with complex catheter placement over a wire  Surgeon: Link Snuffer, MD  Assistants: Alexis Frock, MD--an assistant was necessary due to the complex nature of the case and to assist with retraction, suturing, etc.  Anesthesia: General  Complications: None immediate  EBL: 2 L  Specimens: 1.  None  Drains/Catheters: 1.  22 French three-way urethral catheter 2.  20 French suprapubic catheter 3.  JP drain  Intraoperative findings: 1.  Anterior urethra had a pinpoint stricture.  A wire was unable to be passed through it.  Attempt at DVIU was unsuccessful. 2.  Massive prostate with active bleeding 3.  Able to pass a wire antegrade down the urethra. 4.  Normal bladder mucosa.  Indication: 74 year old male with a history of a 600 g prostate that has required multiple endoscopic interventions.  Most recently, he presented with urinary retention a few days ago.  A wire was unable be passed down the urethra to access the bladder and he had a suprapubic tube placed.  Since then, he has been having gross hematuria.  Hemoglobin was 7.1.  Catheter kept clogging and he presented for the previously mentioned operation.  Description of procedure:  The patient was identified and consent was obtained.  The patient was taken to the operating room and placed in the supine position.  The patient was placed under general anesthesia.  Perioperative antibiotics were administered.  The patient was placed in dorsal lithotomy.  Patient was prepped and draped in a standard sterile fashion and a timeout was performed.  A 20 [place] DVIU scope was advanced  into the urethra and up to the stricture.  There appeared to be a lumen that was tiny but I was unable to pass multiple wires through this.  I inserted the cold knife into the lumen and perform DVIU and tried to reintroduce a wire into the bladder but this still was unsuccessful.  I shot a retrograde urethrogram that confirmed urethral stricture with minimal passage of contrast.  It looked like there was some extravasation of contrast into the spongy tissue.  Given that I was unable to access the bladder through the urethra, decision was made to convert to an open procedure.  The patient was reprepped and draped in standard sterile fashion after being placed in the supine position.  I made a lower midline abdominal incision and carried this down with Bovie electrocautery through the subcutaneous tissue.  The anterior rectus sheath was divided.  I bluntly divided the musculature of the midline.  Open transversalis fascia.  I developed the space of Retzius bluntly.  I placed stay sutures bilaterally into the bladder and subsequently opened with electrocautery.  There was over 1 L of clot that was evacuated after performing the cystotomy.  The prostate itself was massive.  There was diffuse bleeding throughout.  To try to control bleeding, I used electrocautery as well as figure-of-eight 3-0 chromic sutures.  Also used the argon beam.  Despite this, there was some continued bleeding.  I consulted with one of my partners and Dr. Tresa Moore who joined me for the operation at this point.  We performed a flexible cystoscopy antegrade.  There was a urethral stricture but  we were able to pass a wire antegrade.  We then passed a balloon dilator over the wire and balloon dilated the urethra up to 24 [place].  We were then able to pass a 22 [place] three-way urethral catheter over the wire.  30 cc of sterile water was placed into the catheter balloon.  A small cystotomy was made separate from the other and an incision was made in  the skin and a 20 French suprapubic catheter was advanced through the skin and into the bladder.  10 cc of sterile water was instilled into the catheter balloon.  I secured the suprapubic catheter down to the bladder with a 2-0 Vicryl pursestring stitch.  We then closed the bladder in 2 layers first with a running 3-0 chromic for the mucosal layer followed by closure of the muscular layer with a running 2-0 Vicryl.  Continuous bladder irrigation was initiated and the urine was light red on moderate drip.  We placed a JP drain that came out the left side of the abdomen.  We then proceeded with closure and closed the fascia with 0 looped PDS suture.  I closed the skin with staples.  The JP drain in the superpubic catheter were secured down with nylon stitches.  Dressing was applied and this concluded the operation.  During the surgery, the patient required multiple units of blood.  Given the continuous bleeding, I consulted interventional radiology and the patient was transferred to interventional radiology for urgent prostatic artery embolization.  Plan: I have spoken with the ICU.  He will be transferred to the ICU after embolization.

## 2019-10-03 NOTE — Progress Notes (Signed)
No blood scanner was available in OR 7 for blood administration. See paper chart for documentation. Current procedural transfusion counts:  PRBCs: 3 units FFP: 2 units

## 2019-10-03 NOTE — Progress Notes (Signed)
[facility] Progress Note Patient Name: [name] [name]. DOB: [birth year] MRN: [id number]   Date of Service  10/03/2019  HPI/Events of Note  ABG on 100%/PRVC 15/TV 540/P 5 = 7.525/32.5/589  eICU Interventions  Will order: 1. Decrease PRVC rate to 11. 2. Wean FiO2 as tolerated.  3. Repeat ABG at 7:30 AM.      Intervention Category Major Interventions: Respiratory failure - evaluation and management;Acid-Base disturbance - evaluation and management  Asiah Browder [place] 10/03/2019, 6:18 AM

## 2019-10-03 NOTE — Progress Notes (Signed)
Attempted to wean patient per MD and RN. Patient apneic. RN present. Patient placed back on full support. RT will continue to monitor.

## 2019-10-03 NOTE — Consult Note (Signed)
NAME:  [name] Wishon., MRN:  [id number], DOB:  1945/12/11, LOS: 1 ADMISSION DATE:  10/02/2019, CONSULTATION DATE:  10/03/2019 REFERRING MD:  Dr. Gloriann Loan, CHIEF COMPLAINT:  Hematuria/ ABLA  Brief History   74 year old with BPH and placement of suprapubic catheter in early April after being unable to replace his chronic indwelling foley with ongoing hematuria and multiple visits for catheter obstruction.  Presented again with gross hematuria and catheter obstruction and now with ABLA with Hgb 7.1 and AKI taken to OR by urology with complex surgery requiring exploratory laparotomy with open placement of suprapubic catheter and urethral catheter placement requiring antegrade wire placement.  Large 1L clot evacuated with ongoing active bleeding requiring CBL and multiple blood products,  then transferred to IR for urgent prostatic artery embolization.  Patient to return to ICU on mechanical ventilation, PCCM consulted for further medical and ventilator management.   History of present illness   HPI obtained from medical chart review as patient is sedated and intubated on mechanical ventilation.   74 year old patient with prior chronic indwelling foley since 2014 with recent placement of suprapubic catheter, BPH, HTN, and vision impairment (still able to perform ADLs) who presented with hematuria and no drainage from his suprapubic catheter since 2 am on 4/29.   In early April, underwent suprapubic tube placement in [facility] for acute urinary retention after unsuccessful attempts to place urethral catheter due to stricture.  Seen 4/23 in ER for obstructed suprapubic catheter.  Urology unable to place urethral catheter and underwent bedside cystoscopy found to have pin hole anterior urethral stricture which would not accommodate wire placement.  Went to IR to replace suprapubic catheter.  He was discharged home after procedure with clear yellow urine output and to return in two weeks for catheter exchange  and upsize catheter.  He returned to ER on 4/26 for obstruction in the collection bag noted to have bloody urine.  Collection bag exchanged and patient discharged home with planned urology follow up.  He presented back to the ER 4/29 again after ongoing hematuria and catheter obstruction again with lower abdominal  distention and discomfort.  Noted to have frank blood in collection bag.  Denied NSAID use and not taking any anticoagulants.  Has long standing history of BPH previously treated with finasteride and noted on CT in 05/2019 to have markedly enlarged prostate measuring ~600 cc.    On ER evaluation, noted to have Hgb drop from 10.6 on 4/19 to 7.1, WBC 13.2, Na 131, sCr 1.79 (previously 0.8).  He was afebrile and hemodynamically stable.  Transfused with 2 units of PRBC.  Urology and IR consulted, [facility] admitting for acute blood loss anemia related to hematuria.  He was taken to the OR by urology.  Wire unable to be successfully passed through urethra requiring exploratory laparotomy and open suprapubic tube placement found to active bleeding from massive prostate with 1L clot evacuated after cystotomy.   A wire was able to passed antegrade and urethral catheter placed and started on continuous bladder irrigation due to bleeding.  EBL during surgery ~2L with multiple blood transfusions. Given active bleeding from prostate, patient transferred to IR for urgent prostatic artery embolization. Patient to return after IR to ICU on mechanical ventilation.  PCCM consulted for further medical and ventilator management.   Past Medical History  chronic urinary retention with indwelling catheter since 2014 s/p suprapubic catheter 09/2019,  urethral stricture, BPH, HTN, vision impairment  Significant Hospital Events   4/29 admitted  to [facility], to OR with urology then to IR, to ICU as patient remained intubated  Consults:  Urology IR  PCCM  Procedures:  4/29 w/ Urology  1.  Cystoscopy with attempted direct visual  internal ureterotomy 2.  Retrograde urethrogram  3.  Exploratory laparotomy with bladder clot evacuation and open suprapubic tube placement 4.  Urethral dilation with complex catheter placement over a wire  Drains/Catheters 10/02/2019 1.  22 French three-way urethral catheter 2.  20 French suprapubic catheter 3.  JP drain  4/29 ETT >> 4/29 R radial aline >>  Significant Diagnostic Tests:   Micro Data:  4/29 SARS 2/ Flu A/B >>  Antimicrobials:  4/29 cefazolin preop  Interim history/subjective:  Weaning propofol Not awake enough for SBT yet   Objective   Blood pressure 124/82, pulse 72, temperature (!) 95.2 F (35.1 C), resp. rate 14, height 5\' 8"  (1.727 m), weight 86.2 kg, SpO2 100 %.    Vent Mode: PSV;CPAP FiO2 (%):  [40 %-100 %] 40 % Set Rate:  [11 bmp-15 bmp] 11 bmp Vt Set:  [540 mL] 540 mL PEEP:  [5 cmH20] 5 cmH20 Pressure Support:  [5 cmH20] 5 cmH20 Plateau Pressure:  [12 cmH20] 12 cmH20   Intake/Output Summary (Last 24 hours) at 10/03/2019 1006 Last data filed at 10/03/2019 0900 Gross per 24 hour  Intake 5195.77 ml  Output 10800 ml  Net -5604.23 ml   Filed Weights   10/02/19 0328  Weight: 86.2 kg   Examination: General:  Chronically ill appearing older adult M, remains intubated sedated NAD  HEENT: NCAT ETT secure. R pupillary defect. Anicteric sclera  Neuro: Moves to verbal stimulation, is not following commands  CV: RRR s1s2, cap refill < 3 seconds  PULM:  Symmetrical chest expansion, CTA bilaterally  GI: soft, round. normactive lower abdominal incision cdi GU: suptrapubic and urethral catheter with blood tinged output. JP with serosanguinous output.  Extremities: Chronic venous changes BLE. No cyanosis or clubbing. BLE swelling R>L, 1+ pedal edema Skin:c/d/w  Resolved Hospital Problem list    Assessment & Plan:   Acute respiratory insufficiency post-operatively in setting of sedation/paralytics, remains intubated P:  Weaning sedation, SBT when  more awake and hopefully extubate 4/30  VAP bundle  Prop, PRN fent   ABLA secondary to gross hematuria, BPH Chronic urinary retention with suprapubic catheter secondary to urethral stricture  - s/p complex surgery by Urology with open suprapubic catheter placement, massive prostate with 1L clot evacuated after cystotomy.   Placement of urethral catheter after antegrade wire placement, and CBI started.  EBL during surgery ~2L  - then to IR for urgent prostatic artery embolization 4/30 - s/p cumulative 6 units PRBC and 2 units FFP since admit P:  Per Urology and IR  Ongoing continuous bladder irrigation  Hgb stable, continue to trend and transfuse if <7  AKI - likely due to prerenal r/t ABLA +/- obstructive due to blood clot P:  Trend renal indices, I/O  HTN P:  ICU monitoring Holding home meds at this time, PRN metop ordered   Bilateral lower leg swelling, R> L P:   b/l DVT US pending    Best practice:  Diet: NPO Pain/Anxiety/Delirium protocol (if indicated):  VAP protocol (if indicated): yes DVT prophylaxis: SCDs only  GI prophylaxis: PPI Glucose control: monitor Mobility: BR Code Status: full  Family Communication: pending 4/30 Disposition: ICU   CRITICAL CARE Performed by: Cristal Generous   Additional critical care time: 30 minutes  Critical care time  was exclusive of separately billable procedures and treating other patients. Critical care was necessary to treat or prevent imminent or life-threatening deterioration.  Critical care was time spent personally by me on the following activities: development of treatment plan with patient and/or surrogate as well as nursing, discussions with consultants, evaluation of patient's response to treatment, examination of patient, obtaining history from patient or surrogate, ordering and performing treatments and interventions, ordering and review of laboratory studies, ordering and review of radiographic studies, pulse oximetry  and re-evaluation of patient's condition.  Eliseo Gum MSN, AGACNP-BC [place] [id number] If no answer, [id number] 10/03/2019, 10:06 AM

## 2019-10-03 NOTE — Transfer of Care (Signed)
Immediate Anesthesia Transfer of Care Note  Patient: Quinnton Schilz.  Procedure(s) Performed: CYSTOSCOPY WITH DIRECT VISION INTERNAL URETHROTOMY WITH CLOT EVACUATION retrograd urethralgram Coverted to Open. (N/A ) Open Cystostomy Suprapubic tube placement and JP drain and urethral dilation (N/A )  Patient Location: ICU  Anesthesia Type:General  Level of Consciousness: Patient remains intubated per anesthesia plan  Airway & Oxygen Therapy: Patient remains intubated per anesthesia plan and Patient placed on Ventilator (see vital sign flow sheet for setting)  Post-op Assessment: Report given to RN and Post -op Vital signs reviewed and stable  Post vital signs: Reviewed and stable  Last Vitals:  Vitals Value Taken Time  BP 160/80 (ABP)   Temp 33.8 C 10/03/19 0457  Pulse 66 10/03/19 0457  Resp 9 10/03/19 0457  SpO2 100 % 10/03/19 0457  Vitals shown include unvalidated device data.  Last Pain:  Vitals:   10/02/19 2202  TempSrc: Oral  PainSc: 4      Transport to 78M ICU - Abigail Butts RT at bedside, applied to ventilator, PRVC, Brandy RN at bedside with attending MD via Telemed, propofol gtt maintained for sedation, CBI maintained, full report given, questions answered, transfer of patient care in safe and stable condition.    Patients Stated Pain Goal: 1 ([id number])  Complications: No apparent anesthesia complications

## 2019-10-03 NOTE — Consult Note (Signed)
NAME:  [name] [name]., MRN:  [id number], DOB:  28-Jan-1946, LOS: 1 ADMISSION DATE:  10/02/2019, CONSULTATION DATE:  10/03/2019 REFERRING MD:  Dr. Gloriann Loan, CHIEF COMPLAINT:  Hematuria/ ABLA  Brief History   74 year old with BPH and placement of suprapubic catheter in early April after being unable to replace his chronic indwelling foley with ongoing hematuria and multiple visits for catheter obstruction.  Presented again with gross hematuria and catheter obstruction and now with ABLA with Hgb 7.1 and AKI taken to OR by urology with complex surgery requiring exploratory laparotomy with open placement of suprapubic catheter and urethral catheter placement requiring antegrade wire placement.  Large 1L clot evacuated with ongoing active bleeding requiring CBL and multiple blood products,  then transferred to IR for urgent prostatic artery embolization.  Patient to return to ICU on mechanical ventilation, PCCM consulted for further medical and ventilator management.   History of present illness   HPI obtained from medical chart review as patient is sedated and intubated on mechanical ventilation.   74 year old patient with prior chronic indwelling foley since 2014 with recent placement of suprapubic catheter, BPH, HTN, and vision impairment (still able to perform ADLs) who presented with hematuria and no drainage from his suprapubic catheter since 2 am on 4/29.   In early April, underwent suprapubic tube placement in [facility] for acute urinary retention after unsuccessful attempts to place urethral catheter due to stricture.  Seen 4/23 in ER for obstructed suprapubic catheter.  Urology unable to place urethral catheter and underwent bedside cystoscopy found to have pin hole anterior urethral stricture which would not accommodate wire placement.  Went to IR to replace suprapubic catheter.  He was discharged home after procedure with clear yellow urine output and to return in two weeks for catheter exchange  and upsize catheter.  He returned to ER on 4/26 for obstruction in the collection bag noted to have bloody urine.  Collection bag exchanged and patient discharged home with planned urology follow up.  He presented back to the ER 4/29 again after ongoing hematuria and catheter obstruction again with lower abdominal  distention and discomfort.  Noted to have frank blood in collection bag.  Denied NSAID use and not taking any anticoagulants.  Has long standing history of BPH previously treated with finasteride and noted on CT in 05/2019 to have markedly enlarged prostate measuring ~600 cc.    On ER evaluation, noted to have Hgb drop from 10.6 on 4/19 to 7.1, WBC 13.2, Na 131, sCr 1.79 (previously 0.8).  He was afebrile and hemodynamically stable.  Transfused with 2 units of PRBC.  Urology and IR consulted, [facility] admitting for acute blood loss anemia related to hematuria.  He was taken to the OR by urology.  Wire unable to be successfully passed through urethra requiring exploratory laparotomy and open suprapubic tube placement found to active bleeding from massive prostate with 1L clot evacuated after cystotomy.   A wire was able to passed antegrade and urethral catheter placed and started on continuous bladder irrigation due to bleeding.  EBL during surgery ~2L with multiple blood transfusions. Given active bleeding from prostate, patient transferred to IR for urgent prostatic artery embolization. Patient to return after IR to ICU on mechanical ventilation.  PCCM consulted for further medical and ventilator management.   Past Medical History  chronic urinary retention with indwelling catheter since 2014 s/p suprapubic catheter 09/2019,  urethral stricture, BPH, HTN, vision impairment  Significant Hospital Events   4/29 admitted  to [facility], to OR with urology then to IR  Consults:  Urology IR  PCCM  Procedures:  4/29 w/ Urology  1.  Cystoscopy with attempted direct visual internal ureterotomy 2.  Retrograde  urethrogram  3.  Exploratory laparotomy with bladder clot evacuation and open suprapubic tube placement 4.  Urethral dilation with complex catheter placement over a wire  Drains/Catheters 10/02/2019 1.  22 French three-way urethral catheter 2.  20 French suprapubic catheter 3.  JP drain  4/29 ETT >> 4/29 R radial aline >>  Significant Diagnostic Tests:   Micro Data:  4/29 SARS 2/ Flu A/B >>  Antimicrobials:  4/29 cefazolin preop  Interim history/subjective:  Arrives to ICU on propofol, s/p paralytic CBI ongoing   Objective   Blood pressure (!) 169/80, pulse (!) 56, temperature (!) 92.8 F (33.8 C), resp. rate (!) 26, height 5\' 8"  (1.727 m), weight 86.2 kg, SpO2 100 %.    Vent Mode: PRVC FiO2 (%):  [100 %] 100 % Set Rate:  [15 bmp] 15 bmp Vt Set:  [540 mL] 540 mL PEEP:  [5 cmH20] 5 cmH20 Plateau Pressure:  [12 cmH20] 12 cmH20   Intake/Output Summary (Last 24 hours) at 10/03/2019 0513 Last data filed at 10/03/2019 0456 Gross per 24 hour  Intake 5119.4 ml  Output 4450 ml  Net 669.4 ml   Filed Weights   10/02/19 0328  Weight: 86.2 kg   Examination: General:  Older male sedated and intubated on MV HEENT: MM pale/moist, ETT, pupils 3/not reactive- some opaqueness in right eye Neuro: sedated/ paralyzed  CV: rr no murmur, right groin site soft, dry dressing, +distal R DP PULM:  MV supported breaths, CTA GI: soft, bs+, lower abd incision site dressing CDI, JP, suprapubic and urethral catheter with ongoing CBI- blood tinged, no clots  Extremities: warm/dry, lower extremity chronic venous changes, b/l LE edema +1, R> L, ?charot right foot  Skin: no rashes  Resolved Hospital Problem list    Assessment & Plan:   Acute respiratory insufficiency in the post operative setting  P:  Full MV support, PRVC 6-8 cc/kg, rate 15 CXR now ABG VAP bundle  PAD protocol with propofol and prn fentanyl with bowel regimen Likely can extubate later today    ABLA secondary to gross  hematuria, BPH Chronic urinary retention with suprapubic catheter secondary to urethral stricture  - s/p complex surgery by Urology with open suprapubic catheter placement, massive prostate with 1L clot evacuated after cystotomy.   Placement of urethral catheter after antegrade wire placement, and CBI started.  EBL during surgery ~2L  - then to IR for urgent prostatic artery embolization 4/30 - s/p cumulative 6 units PRBC and 2 units FFP since admit P:  Per Urology and IR  Blood pressure stable, goal MAP > 65 Ongoing continuous bladder irrigation  Check DIC panel  H/H q 6 Transfuse for Hgb < 7   AKI - likely due to prerenal r/t ABLA +/- obstructive due to blood clot P:  Strict I/Os Trend renal function    HTN P:  Holding home HCTZ, metoprolol    Bilateral lower leg swelling, R> L P:  Pending b/l DVT US  TSH  2.205   Best practice:  Diet: NPO Pain/Anxiety/Delirium protocol (if indicated):  VAP protocol (if indicated): yes DVT prophylaxis: SCDs only  GI prophylaxis: PPI Glucose control: add SSI if glucose > 180 Mobility: BR Code Status: full  Family Communication: pending Disposition: ICU   Labs   CBC: Recent Labs  Lab 10/02/19 0952  WBC 13.2*  NEUTROABS 10.3*  HGB 7.1*  HCT 24.6*  MCV 70.5*  PLT [id number]    Basic Metabolic Panel: Recent Labs  Lab 10/02/19 0952 10/02/19 1236  NA 131*  --   K 4.2  --   CL 97*  --   CO2 17*  --   GLUCOSE 139*  --   BUN 24*  --   CREATININE 1.79*  --   CALCIUM 8.6*  --   MG  --  1.9  PHOS  --  5.0*   GFR: Estimated Creatinine Clearance: 39.2 mL/min (A) (by C-G formula based on SCr of 1.79 mg/dL (H)). Recent Labs  Lab 10/02/19 0952  WBC 13.2*    Liver Function Tests: Recent Labs  Lab 10/02/19 0953  AST 46*  ALT 35  ALKPHOS 95  BILITOT 0.8  PROT 6.5  ALBUMIN 3.1*   No results for input(s): LIPASE, AMYLASE in the last 168 hours. No results for input(s): AMMONIA in the last 168 hours.  ABG    Component  Value Date/Time   TCO2 24 12/23/2012 0240     Coagulation Profile: Recent Labs  Lab 10/02/19 0953  INR 1.1    Cardiac Enzymes: No results for input(s): CKTOTAL, CKMB, CKMBINDEX, TROPONINI in the last 168 hours.  HbA1C: No results found for: HGBA1C  CBG: No results for input(s): GLUCAP in the last 168 hours.  Review of Systems:   Unable to assess as patient is sedated and intubated on mechanical ventilation.   Past Medical History  He,  has a past medical history of Anemia (10/02/2019), Blind, Disorder of bone and cartilage, unspecified, Edema, Elevated prostate specific antigen (PSA), Hypertension, Hypopotassemia, Nonspecific abnormal results of liver function study, and Unspecified glaucoma(365.9).   Surgical History    Past Surgical History:  Procedure Laterality Date  . CYSTOSCOPY WITH FULGERATION N/A 01/17/2018   Procedure: CYSTOSCOPY CLOT EVACUATION WITH FULGERATION TUR BIOPSY OF BLADDER NECK;  Surgeon: Irine Seal, MD;  Location: [facility];  Service: Urology;  Laterality: N/A;  . EYE SURGERY Bilateral 2009   for glaucoma  . EYE SURGERY Left 2014  . HERNIA REPAIR Right 2003   Dr. Jeraldine Loots  . LAPAROSCOPIC APPENDECTOMY N/A 12/24/2012   Procedure:  LAPAROSCOPIC APPENDECTOMY ;  Surgeon: Pedro Earls, MD;  Location: [facility];  Service: General;  Laterality: N/A;  . TRANSURETHRAL RESECTION OF PROSTATE N/A 05/27/2019   Procedure: Cystoscopy Clot Evacuation and Fulgration;  Surgeon: Ardis Hughs, MD;  Location: [facility];  Service: Urology;  Laterality: N/A;     Social History   reports that he quit smoking about 51 years ago. His smoking use included cigarettes. He has never used smokeless tobacco. He reports that he does not drink alcohol or use drugs.   Family History   His family history includes Kidney failure in his mother.   Allergies No Known Allergies   Home Medications  Prior to Admission medications   Medication Sig Start Date End Date Taking?  Authorizing Provider  Ascorbic Acid (VITAMIN C WITH ROSE HIPS) 1000 MG tablet Take 1,000 mg by mouth daily with lunch.   Yes [provider]  aspirin-acetaminophen-caffeine (EXCEDRIN MIGRAINE) [phone] MG tablet Take 1 tablet by mouth daily as needed (pain).   Yes [provider]  brimonidine (ALPHAGAN) 0.2 % ophthalmic solution Place 1 drop into both eyes 2 (two) times daily. 03/11/19  Yes [provider]  ciprofloxacin (CIPRO) 500 MG tablet Take 500 mg by  mouth daily.    Yes [provider]  dorzolamide-timolol (COSOPT) 22.3-6.8 MG/ML ophthalmic solution APPOINTMENT OVERDUE, instill 1 drop in both eyes twice daily Patient taking differently: Place 1 drop into both eyes 2 (two) times daily.  07/14/13  Yes [name], NP  folic acid (FOLVITE) [id number] MCG tablet Take 800 mcg by mouth daily with lunch.    Yes [provider]  hydrochlorothiazide (MICROZIDE) 12.5 MG capsule Take 1 capsule (12.5 mg total) by mouth daily. 07/03/19  Yes [name], NP  Magnesium 250 MG TABS Take 250 mg by mouth daily as needed (migraine headache).    Yes [provider]  metoprolol tartrate (LOPRESSOR) 25 MG tablet Take 12.5 mg by mouth 2 (two) times daily.   Yes [provider]  pilocarpine (PILOCAR) 2 % ophthalmic solution Place 1 drop into the right eye every 6 (six) hours.  12/25/17  Yes [provider]  potassium chloride SA (KLOR-CON) 20 MEQ tablet Take 1 tablet (20 mEq total) by mouth daily. 07/03/19  Yes [name], NP  pyridOXINE (VITAMIN B-6) 50 MG tablet Take 50 mg by mouth daily with lunch.   Yes [provider]  vitamin B-12 (CYANOCOBALAMIN) 1000 MCG tablet Take 1,000 mcg by mouth daily with lunch.   Yes [provider]     CRITICAL CARE Performed by: Kennieth Rad   Total critical care time: 55 minutes   Critical care time was exclusive of separately billable procedures and treating other  patients.   Critical care was necessary to treat or prevent imminent or life-threatening deterioration.   Critical care was time spent personally by me on the following activities: development of treatment plan with patient and/or surrogate as well as nursing, discussions with consultants, evaluation of patient's response to treatment, examination of patient, obtaining history from patient or surrogate, ordering and performing treatments and interventions, ordering and review of laboratory studies, ordering and review of radiographic studies, pulse oximetry and re-evaluation of patient's condition.  Kennieth Rad, MSN, AGACNP-BC [facility] 10/03/2019, 5:20 AM

## 2019-10-03 NOTE — Progress Notes (Signed)
Referring Physician(s): Link Snuffer  Supervising Physician: Jacqulynn Cadet  Patient Status:  [facility] - In-pt  Chief Complaint: Follow up bilateral prostate artery embolization.  Subjective:  Patient sedated/ventilated - no family/staff at bedside during exam.   Allergies: Patient has no known allergies.  Medications: Prior to Admission medications   Medication Sig Start Date End Date Taking? Authorizing Provider  Ascorbic Acid (VITAMIN C WITH ROSE HIPS) 1000 MG tablet Take 1,000 mg by mouth daily with lunch.   Yes [provider]  aspirin-acetaminophen-caffeine (EXCEDRIN MIGRAINE) [phone] MG tablet Take 1 tablet by mouth daily as needed (pain).   Yes [provider]  brimonidine (ALPHAGAN) 0.2 % ophthalmic solution Place 1 drop into both eyes 2 (two) times daily. 03/11/19  Yes [provider]  ciprofloxacin (CIPRO) 500 MG tablet Take 500 mg by mouth daily.    Yes [provider]  dorzolamide-timolol (COSOPT) 22.3-6.8 MG/ML ophthalmic solution APPOINTMENT OVERDUE, instill 1 drop in both eyes twice daily Patient taking differently: Place 1 drop into both eyes 2 (two) times daily.  07/14/13  Yes [name], NP  folic acid (FOLVITE) [id number] MCG tablet Take 800 mcg by mouth daily with lunch.    Yes [provider]  hydrochlorothiazide (MICROZIDE) 12.5 MG capsule Take 1 capsule (12.5 mg total) by mouth daily. 07/03/19  Yes [name], NP  Magnesium 250 MG TABS Take 250 mg by mouth daily as needed (migraine headache).    Yes [provider]  metoprolol tartrate (LOPRESSOR) 25 MG tablet Take 12.5 mg by mouth 2 (two) times daily.   Yes [provider]  pilocarpine (PILOCAR) 2 % ophthalmic solution Place 1 drop into the right eye every 6 (six) hours.  12/25/17  Yes [provider]  potassium chloride SA (KLOR-CON) 20 MEQ tablet Take 1 tablet (20 mEq total) by mouth daily. 07/03/19  Yes [name], NP    pyridOXINE (VITAMIN B-6) 50 MG tablet Take 50 mg by mouth daily with lunch.   Yes [provider]  vitamin B-12 (CYANOCOBALAMIN) 1000 MCG tablet Take 1,000 mcg by mouth daily with lunch.   Yes [provider]     Vital Signs: BP (!) 142/83    Pulse 79    Temp (!) 97.2 F (36.2 C) (Esophageal)    Resp 14    Ht 5\' 8"  (1.727 m)    Wt 190 lb (86.2 kg)    SpO2 100%    BMI 28.89 kg/m   Physical Exam Vitals and nursing note reviewed.  Constitutional:      Comments: Sedated, propofol hanging, intubated/ventilated  HENT:     Head: Normocephalic.  Cardiovascular:     Rate and Rhythm: Normal rate and regular rhythm.  Pulmonary:     Comments: (+) intubated, ventilated Abdominal:     General: There is no distension.     Palpations: Abdomen is soft.  Musculoskeletal:     Right lower leg: Edema (3+ pitting edema to calf, darkening of skin, unable to palpate distal pulses ) present.     Left lower leg: Edema (1+ pitting edeme to ankle, darkening of skin, distal pulse palpable) present.  Skin:    General: Skin is warm and dry.     Comments: (+) right CFA puncture site clean, dry, dressed appropriately. No erythema, edema, bleeding or discharge.  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 Interventional Radiology was called in for emergent pelvic arteriogram and prostatic artery embolization due to uncontrollable bleeding in the operating room and transfusion requirement. Patient presents postoperative, intubated and stable for the time being. 3 units packed red blood cells have been transfused and bleeding appears to be tamponaded. We will proceed with emergent embolization. EXAM: IR ULTRASOUND GUIDANCE VASC ACCESS RIGHT; ADDITIONAL ARTERIOGRAPHY; IR EMBO ART VEN HEMORR LYMPH EXTRAV INC GUIDE  ROADMAPPING; PELVIC SELECTIVE ARTERIOGRAPHY 1. Ultrasound-guided vascular access right common femoral artery 2. Catheterization of the left internal iliac artery with arteriogram 3. Catheterization of the prostatic artery with arteriogram 4. Particle embolization of the prosthetic artery. 5. Catheterization of the right external iliac artery with arteriogram 6. Catheterization of the right internal iliac artery with arteriogram 7. Catheterization of the right gluteal pudendal trunk with arteriogram. 8. Catheterization of the right common trunk of the prosthetic artery and middle rectal artery with arteriogram 9. Catheterization of the right prosthetic artery with arteriogram 10. Coil embolization of the right prosthetic artery MEDICATIONS: None ANESTHESIA/SEDATION: General anesthesia performed by the anesthesiology service CONTRAST:  35mL OMNIPAQUE IOHEXOL 300 MG/ML  SOLN FLUOROSCOPY TIME:  Fluoroscopy Time: 17 minutes 0 seconds (2808 mGy). COMPLICATIONS: None immediate. PROCEDURE: Informed consent was obtained from the patient following explanation of the procedure, risks, benefits and alternatives. The patient understands, agrees and consents for the procedure. All questions were addressed. A time out was performed prior to the initiation of the procedure. Maximal barrier sterile technique utilized including caps, mask, sterile gowns, sterile gloves, large sterile drape, hand hygiene, and Betadine prep. The right common femoral artery was interrogated with ultrasound and found to be widely patent. An image was obtained and stored for the medical record. Local anesthesia was attained by infiltration with 1% lidocaine. A small dermatotomy was made. Under real-time sonographic guidance, the vessel was punctured with a 21 gauge micropuncture needle. Using standard technique, the initial micro needle was exchanged over a 0.018 micro wire for a transitional 4 [place] micro sheath. The micro sheath was then exchanged over  a 0.035 wire for a 5 French vascular sheath. A C2 cobra catheter was advanced up in over the aortic bifurcation and used to select the left internal iliac artery. A left internal iliac arteriogram was performed. The origin of the prosthetic artery is identified and appears to a rise from a common trunk with the internal pudendal artery and middle rectal artery. Of note, no obturator artery is identified. The left obturator artery may be replaced to the inferior epigastric artery. A Progreat outflow microcatheter was then advanced over a Fathom 16 wire and used to successfully select the prosthetic artery. Digital subtraction angiography demonstrates excellent parenchymal blush of the left hemi prostate. No evidence of collateral filling. Particle embolization was then performed to stasis using 100-300 micron embospheres. Post embolization arteriography demonstrates cessation of flow into the prosthetic artery. The middle rectal and internal pudendal arteries remain patent. The microcatheter was removed. The 5 French C2 cobra catheter was then formed into a Waltman loop in used to select the ipsilateral right external iliac artery. An arteriogram was performed to identify the origin of the internal iliac artery. The internal iliac artery was then selected. Arteriography was performed. The obturator is present on the right side. The origin of the prosthetic artery is identified arising from a the inferior gluteal artery which in turn arises from a gluteal pudendal trunk. The prosthetic artery was successfully advanced  into the gluteal pudendal trunk and arteriography was performed. The origin of the prosthetic artery was successfully identified. The microcatheter was then successfully advanced into the prosthetic artery. Upon the super selective digital subtraction angiography, the prosthetic artery also gives rise to the right-sided middle rectal artery. Arteriography was performed in multiple obliquities identifying  that the middle rectal artery comes off fairly proximal on the prosthetic artery. The microcatheter was successfully advanced more distally within the prosthetic artery. Repeat angiography demonstrates excellent parenchymal blush of the prostate gland without evidence of reflux into the middle rectal artery. Particle embolization was then performed using 100-300 micron embospheres. Embolization was taken to near stasis. The microcatheter was pulled back and arteriography was performed confirming continued patency of the middle rectal artery branches. The catheters were removed. Hemostasis was attained with the assistance of a 6 French Angio-Seal device. IMPRESSION: Successful bilateral prosthetic artery embolization. Electronically Signed   By: Jacqulynn Cadet M.D.   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 Interventional Radiology was called in for emergent pelvic arteriogram and prostatic artery embolization due to uncontrollable bleeding in the operating room and transfusion requirement. Patient presents postoperative, intubated and stable for the time being. 3 units packed red blood cells have been transfused and bleeding appears to be tamponaded. We will proceed with emergent embolization. EXAM: IR ULTRASOUND GUIDANCE VASC ACCESS RIGHT; ADDITIONAL ARTERIOGRAPHY; IR EMBO ART VEN HEMORR LYMPH EXTRAV INC GUIDE ROADMAPPING; PELVIC SELECTIVE ARTERIOGRAPHY 1. Ultrasound-guided vascular access right common femoral artery 2. Catheterization of the left internal iliac artery with arteriogram 3. Catheterization of the prostatic artery with arteriogram 4. Particle embolization of the prosthetic artery. 5. Catheterization of the right external iliac artery with arteriogram 6. Catheterization of the right internal iliac artery with arteriogram 7. Catheterization of the right gluteal pudendal  trunk with arteriogram. 8. Catheterization of the right common trunk of the prosthetic artery and middle rectal artery with arteriogram 9. Catheterization of the right prosthetic artery with arteriogram 10. Coil embolization of the right prosthetic artery MEDICATIONS: None ANESTHESIA/SEDATION: General anesthesia performed by the anesthesiology service CONTRAST:  51mL OMNIPAQUE IOHEXOL 300 MG/ML  SOLN FLUOROSCOPY TIME:  Fluoroscopy Time: 17 minutes 0 seconds (2808 mGy). COMPLICATIONS: None immediate. PROCEDURE: Informed consent was obtained from the patient following explanation of the procedure, risks, benefits and alternatives. The patient understands, agrees and consents for the procedure. All questions were addressed. A time out was performed prior to the initiation of the procedure. Maximal barrier sterile technique utilized including caps, mask, sterile gowns, sterile gloves, large sterile drape, hand hygiene, and Betadine prep. The right common femoral artery was interrogated with ultrasound and found to be widely patent. An image was obtained and stored for the medical record. Local anesthesia was attained by infiltration with 1% lidocaine. A small dermatotomy was made. Under real-time sonographic guidance, the vessel was punctured with a 21 gauge micropuncture needle. Using standard technique, the initial micro needle was exchanged over a 0.018 micro wire for a transitional 4 [place] micro sheath. The micro sheath was then exchanged over a 0.035 wire for a 5 French vascular sheath. A C2 cobra catheter was advanced up in over the aortic bifurcation and used to select the left internal iliac artery. A left internal iliac arteriogram was performed. The origin of the prosthetic artery is identified and appears to a rise from a common trunk with the internal pudendal artery and middle rectal  artery. Of note, no obturator artery is identified. The left obturator artery may be replaced to the inferior epigastric  artery. A Progreat outflow microcatheter was then advanced over a Fathom 16 wire and used to successfully select the prosthetic artery. Digital subtraction angiography demonstrates excellent parenchymal blush of the left hemi prostate. No evidence of collateral filling. Particle embolization was then performed to stasis using 100-300 micron embospheres. Post embolization arteriography demonstrates cessation of flow into the prosthetic artery. The middle rectal and internal pudendal arteries remain patent. The microcatheter was removed. The 5 French C2 cobra catheter was then formed into a Waltman loop in used to select the ipsilateral right external iliac artery. An arteriogram was performed to identify the origin of the internal iliac artery. The internal iliac artery was then selected. Arteriography was performed. The obturator is present on the right side. The origin of the prosthetic artery is identified arising from a the inferior gluteal artery which in turn arises from a gluteal pudendal trunk. The prosthetic artery was successfully advanced into the gluteal pudendal trunk and arteriography was performed. The origin of the prosthetic artery was successfully identified. The microcatheter was then successfully advanced into the prosthetic artery. Upon the super selective digital subtraction angiography, the prosthetic artery also gives rise to the right-sided middle rectal artery. Arteriography was performed in multiple obliquities identifying that the middle rectal artery comes off fairly proximal on the prosthetic artery. The microcatheter was successfully advanced more distally within the prosthetic artery. Repeat angiography demonstrates excellent parenchymal blush of the prostate gland without evidence of reflux into the middle rectal artery. Particle embolization was then performed using 100-300 micron embospheres. Embolization was taken to near stasis. The microcatheter was pulled back and arteriography  was performed confirming continued patency of the middle rectal artery branches. The catheters were removed. Hemostasis was attained with the assistance of a 6 French Angio-Seal device. IMPRESSION: Successful bilateral prosthetic artery embolization. Electronically Signed   By: Jacqulynn Cadet M.D.   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 Interventional Radiology was called in for emergent pelvic arteriogram and prostatic artery embolization due to uncontrollable bleeding in the operating room and transfusion requirement. Patient presents postoperative, intubated and stable for the time being. 3 units packed red blood cells have been transfused and bleeding appears to be tamponaded. We will proceed with emergent embolization. EXAM: IR ULTRASOUND GUIDANCE VASC ACCESS RIGHT; ADDITIONAL ARTERIOGRAPHY; IR EMBO ART VEN HEMORR LYMPH EXTRAV INC GUIDE ROADMAPPING; PELVIC SELECTIVE ARTERIOGRAPHY 1. Ultrasound-guided vascular access right common femoral artery 2. Catheterization of the left internal iliac artery with arteriogram 3. Catheterization of the prostatic artery with arteriogram 4. Particle embolization of the prosthetic artery. 5. Catheterization of the right external iliac artery with arteriogram 6. Catheterization of the right internal iliac artery with arteriogram 7. Catheterization of the right gluteal pudendal trunk with arteriogram. 8. Catheterization of the right common trunk of the prosthetic artery and middle rectal artery with arteriogram 9. Catheterization of the right prosthetic artery with arteriogram 10. Coil embolization of the right prosthetic artery MEDICATIONS: None ANESTHESIA/SEDATION: General anesthesia performed by the anesthesiology service CONTRAST:  11mL OMNIPAQUE IOHEXOL 300 MG/ML  SOLN FLUOROSCOPY TIME:  Fluoroscopy Time: 17 minutes 0 seconds (2808 mGy).  COMPLICATIONS: None immediate. PROCEDURE: Informed consent was obtained from the patient following explanation of the procedure, risks, benefits and alternatives. The patient understands, agrees and consents for the procedure. All questions  were addressed. A time out was performed prior to the initiation of the procedure. Maximal barrier sterile technique utilized including caps, mask, sterile gowns, sterile gloves, large sterile drape, hand hygiene, and Betadine prep. The right common femoral artery was interrogated with ultrasound and found to be widely patent. An image was obtained and stored for the medical record. Local anesthesia was attained by infiltration with 1% lidocaine. A small dermatotomy was made. Under real-time sonographic guidance, the vessel was punctured with a 21 gauge micropuncture needle. Using standard technique, the initial micro needle was exchanged over a 0.018 micro wire for a transitional 4 [place] micro sheath. The micro sheath was then exchanged over a 0.035 wire for a 5 French vascular sheath. A C2 cobra catheter was advanced up in over the aortic bifurcation and used to select the left internal iliac artery. A left internal iliac arteriogram was performed. The origin of the prosthetic artery is identified and appears to a rise from a common trunk with the internal pudendal artery and middle rectal artery. Of note, no obturator artery is identified. The left obturator artery may be replaced to the inferior epigastric artery. A Progreat outflow microcatheter was then advanced over a Fathom 16 wire and used to successfully select the prosthetic artery. Digital subtraction angiography demonstrates excellent parenchymal blush of the left hemi prostate. No evidence of collateral filling. Particle embolization was then performed to stasis using 100-300 micron embospheres. Post embolization arteriography demonstrates cessation of flow into the prosthetic artery. The middle rectal and internal  pudendal arteries remain patent. The microcatheter was removed. The 5 French C2 cobra catheter was then formed into a Waltman loop in used to select the ipsilateral right external iliac artery. An arteriogram was performed to identify the origin of the internal iliac artery. The internal iliac artery was then selected. Arteriography was performed. The obturator is present on the right side. The origin of the prosthetic artery is identified arising from a the inferior gluteal artery which in turn arises from a gluteal pudendal trunk. The prosthetic artery was successfully advanced into the gluteal pudendal trunk and arteriography was performed. The origin of the prosthetic artery was successfully identified. The microcatheter was then successfully advanced into the prosthetic artery. Upon the super selective digital subtraction angiography, the prosthetic artery also gives rise to the right-sided middle rectal artery. Arteriography was performed in multiple obliquities identifying that the middle rectal artery comes off fairly proximal on the prosthetic artery. The microcatheter was successfully advanced more distally within the prosthetic artery. Repeat angiography demonstrates excellent parenchymal blush of the prostate gland without evidence of reflux into the middle rectal artery. Particle embolization was then performed using 100-300 micron embospheres. Embolization was taken to near stasis. The microcatheter was pulled back and arteriography was performed confirming continued patency of the middle rectal artery branches. The catheters were removed. Hemostasis was attained with the assistance of a 6 French Angio-Seal device. IMPRESSION: Successful bilateral prosthetic artery embolization. Electronically Signed   By: Jacqulynn Cadet M.D.   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  Interventional Radiology was called in for emergent pelvic arteriogram and prostatic artery embolization due to uncontrollable bleeding in the operating room and transfusion requirement. Patient presents postoperative, intubated and stable for the time being. 3 units packed red blood cells have been transfused and bleeding appears to be tamponaded. We will proceed with emergent embolization. EXAM: IR ULTRASOUND GUIDANCE  VASC ACCESS RIGHT; ADDITIONAL ARTERIOGRAPHY; IR EMBO ART VEN HEMORR LYMPH EXTRAV INC GUIDE ROADMAPPING; PELVIC SELECTIVE ARTERIOGRAPHY 1. Ultrasound-guided vascular access right common femoral artery 2. Catheterization of the left internal iliac artery with arteriogram 3. Catheterization of the prostatic artery with arteriogram 4. Particle embolization of the prosthetic artery. 5. Catheterization of the right external iliac artery with arteriogram 6. Catheterization of the right internal iliac artery with arteriogram 7. Catheterization of the right gluteal pudendal trunk with arteriogram. 8. Catheterization of the right common trunk of the prosthetic artery and middle rectal artery with arteriogram 9. Catheterization of the right prosthetic artery with arteriogram 10. Coil embolization of the right prosthetic artery MEDICATIONS: None ANESTHESIA/SEDATION: General anesthesia performed by the anesthesiology service CONTRAST:  33mL OMNIPAQUE IOHEXOL 300 MG/ML  SOLN FLUOROSCOPY TIME:  Fluoroscopy Time: 17 minutes 0 seconds (2808 mGy). COMPLICATIONS: None immediate. PROCEDURE: Informed consent was obtained from the patient following explanation of the procedure, risks, benefits and alternatives. The patient understands, agrees and consents for the procedure. All questions were addressed. A time out was performed prior to the initiation of the procedure. Maximal barrier sterile technique utilized including caps, mask, sterile gowns, sterile gloves, large sterile drape, hand hygiene, and Betadine prep. The  right common femoral artery was interrogated with ultrasound and found to be widely patent. An image was obtained and stored for the medical record. Local anesthesia was attained by infiltration with 1% lidocaine. A small dermatotomy was made. Under real-time sonographic guidance, the vessel was punctured with a 21 gauge micropuncture needle. Using standard technique, the initial micro needle was exchanged over a 0.018 micro wire for a transitional 4 [place] micro sheath. The micro sheath was then exchanged over a 0.035 wire for a 5 French vascular sheath. A C2 cobra catheter was advanced up in over the aortic bifurcation and used to select the left internal iliac artery. A left internal iliac arteriogram was performed. The origin of the prosthetic artery is identified and appears to a rise from a common trunk with the internal pudendal artery and middle rectal artery. Of note, no obturator artery is identified. The left obturator artery may be replaced to the inferior epigastric artery. A Progreat outflow microcatheter was then advanced over a Fathom 16 wire and used to successfully select the prosthetic artery. Digital subtraction angiography demonstrates excellent parenchymal blush of the left hemi prostate. No evidence of collateral filling. Particle embolization was then performed to stasis using 100-300 micron embospheres. Post embolization arteriography demonstrates cessation of flow into the prosthetic artery. The middle rectal and internal pudendal arteries remain patent. The microcatheter was removed. The 5 French C2 cobra catheter was then formed into a Waltman loop in used to select the ipsilateral right external iliac artery. An arteriogram was performed to identify the origin of the internal iliac artery. The internal iliac artery was then selected. Arteriography was performed. The obturator is present on the right side. The origin of the prosthetic artery is identified arising from a the inferior  gluteal artery which in turn arises from a gluteal pudendal trunk. The prosthetic artery was successfully advanced into the gluteal pudendal trunk and arteriography was performed. The origin of the prosthetic artery was successfully identified. The microcatheter was then successfully advanced into the prosthetic artery. Upon the super selective digital subtraction angiography, the prosthetic artery also gives rise to the right-sided middle rectal artery. Arteriography was performed in multiple obliquities identifying that the middle rectal artery comes off fairly proximal on the prosthetic artery. The microcatheter  was successfully advanced more distally within the prosthetic artery. Repeat angiography demonstrates excellent parenchymal blush of the prostate gland without evidence of reflux into the middle rectal artery. Particle embolization was then performed using 100-300 micron embospheres. Embolization was taken to near stasis. The microcatheter was pulled back and arteriography was performed confirming continued patency of the middle rectal artery branches. The catheters were removed. Hemostasis was attained with the assistance of a 6 French Angio-Seal device. IMPRESSION: Successful bilateral prosthetic artery embolization. Electronically Signed   By: Jacqulynn Cadet M.D.   On: 10/03/2019 08:41   DG Cystogram  Result Date: 10/03/2019 CLINICAL DATA:  Intraoperative imaging for marked benign prostatic hyperplasia and urinary retention. EXAM: CYSTOGRAM TECHNIQUE: Single image from an attempted intraoperative retrograde cystourethrogram. FLUOROSCOPY TIME:  Fluoroscopy Time:  22 seconds Radiation Exposure Index (if provided by the fluoroscopic device): Not applicable. Number of Acquired Spot Images: 0 COMPARISON:  Operative note of same date. FINDINGS: A single image from an attempted retrograde cystourethrogram was submitted. This is limited, secondary to positioning. This demonstrates a stricture within the  mid (likely prostatic) urethra. The more proximal urethra is dilated. Only minimal contrast is identified presumably within the urinary bladder. Lymphovascular extravasation of contrast identified. IMPRESSION: Limited, attempted cystourethrogram as detailed above. 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 Lack of significant opacification of the urinary bladder. Electronically Signed   By: Abigail Miyamoto M.D.   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 Interventional Radiology was called in for emergent pelvic arteriogram and prostatic artery embolization due to uncontrollable bleeding in the operating room and transfusion requirement. Patient presents postoperative, intubated and stable for the time being. 3 units packed red blood cells have been transfused and bleeding appears to be tamponaded. We will proceed with emergent embolization. EXAM: IR ULTRASOUND GUIDANCE VASC ACCESS RIGHT; ADDITIONAL ARTERIOGRAPHY; IR EMBO ART VEN HEMORR LYMPH EXTRAV INC GUIDE ROADMAPPING; PELVIC SELECTIVE ARTERIOGRAPHY 1. Ultrasound-guided vascular access right common femoral artery 2. Catheterization of the left internal iliac artery with arteriogram 3. Catheterization of the prostatic artery with arteriogram 4. Particle embolization of the prosthetic artery. 5. Catheterization of the right external iliac artery with arteriogram 6. Catheterization of the right internal iliac artery with arteriogram 7. Catheterization of the right gluteal pudendal trunk with arteriogram. 8. Catheterization of the right common trunk of the prosthetic artery and middle rectal artery with arteriogram 9. Catheterization of the right prosthetic artery with arteriogram 10. Coil embolization of the right prosthetic artery MEDICATIONS: None ANESTHESIA/SEDATION: General anesthesia performed by the anesthesiology service CONTRAST:  92mL  OMNIPAQUE IOHEXOL 300 MG/ML  SOLN FLUOROSCOPY TIME:  Fluoroscopy Time: 17 minutes 0 seconds (2808 mGy). COMPLICATIONS: None immediate. PROCEDURE: Informed consent was obtained from the patient following explanation of the procedure, risks, benefits and alternatives. The patient understands, agrees and consents for the procedure. All questions were addressed. A time out was performed prior to the initiation of the procedure. Maximal barrier sterile technique utilized including caps, mask, sterile gowns, sterile gloves, large sterile drape, hand hygiene, and Betadine prep. The right common femoral artery was interrogated with ultrasound and found to be widely patent. An image was obtained and stored for the medical record. Local anesthesia was attained by infiltration with 1% lidocaine. A small dermatotomy was made. Under real-time sonographic guidance, the vessel was punctured with a 21 gauge micropuncture needle. Using standard technique, the initial micro needle was exchanged over a  0.018 micro wire for a transitional 4 [place] micro sheath. The micro sheath was then exchanged over a 0.035 wire for a 5 French vascular sheath. A C2 cobra catheter was advanced up in over the aortic bifurcation and used to select the left internal iliac artery. A left internal iliac arteriogram was performed. The origin of the prosthetic artery is identified and appears to a rise from a common trunk with the internal pudendal artery and middle rectal artery. Of note, no obturator artery is identified. The left obturator artery may be replaced to the inferior epigastric artery. A Progreat outflow microcatheter was then advanced over a Fathom 16 wire and used to successfully select the prosthetic artery. Digital subtraction angiography demonstrates excellent parenchymal blush of the left hemi prostate. No evidence of collateral filling. Particle embolization was then performed to stasis using 100-300 micron embospheres. Post embolization  arteriography demonstrates cessation of flow into the prosthetic artery. The middle rectal and internal pudendal arteries remain patent. The microcatheter was removed. The 5 French C2 cobra catheter was then formed into a Waltman loop in used to select the ipsilateral right external iliac artery. An arteriogram was performed to identify the origin of the internal iliac artery. The internal iliac artery was then selected. Arteriography was performed. The obturator is present on the right side. The origin of the prosthetic artery is identified arising from a the inferior gluteal artery which in turn arises from a gluteal pudendal trunk. The prosthetic artery was successfully advanced into the gluteal pudendal trunk and arteriography was performed. The origin of the prosthetic artery was successfully identified. The microcatheter was then successfully advanced into the prosthetic artery. Upon the super selective digital subtraction angiography, the prosthetic artery also gives rise to the right-sided middle rectal artery. Arteriography was performed in multiple obliquities identifying that the middle rectal artery comes off fairly proximal on the prosthetic artery. The microcatheter was successfully advanced more distally within the prosthetic artery. Repeat angiography demonstrates excellent parenchymal blush of the prostate gland without evidence of reflux into the middle rectal artery. Particle embolization was then performed using 100-300 micron embospheres. Embolization was taken to near stasis. The microcatheter was pulled back and arteriography was performed confirming continued patency of the middle rectal artery branches. The catheters were removed. Hemostasis was attained with the assistance of a 6 French Angio-Seal device. IMPRESSION: Successful bilateral prosthetic artery embolization. Electronically Signed   By: Jacqulynn Cadet M.D.   On: 10/03/2019 08:41   DG Chest Port 1 View  Result Date:  10/03/2019 CLINICAL DATA:  Intubation EXAM: PORTABLE CHEST 1 VIEW COMPARISON:  None. FINDINGS: Endotracheal tube with tip halfway between the clavicular heads and carina, 2.5 cm above the carina. The enteric tube and thermistor reaches the stomach. Generous heart size likely from low volumes. There is no edema, consolidation, effusion, or pneumothorax. IMPRESSION: Unremarkable hardware positioning and low volume but clear lungs. Electronically Signed   By: Monte Fantasia M.D.   On: 10/03/2019 05:41   DG Abd Portable 1V  Result Date: 10/03/2019 CLINICAL DATA:  NG tube placement EXAM: PORTABLE ABDOMEN - 1 VIEW COMPARISON:  None. FINDINGS: Enteric tube terminates within the stomach. Included bowel gas pattern is unremarkable. IMPRESSION: Enteric tube within the stomach. Electronically Signed   By: Macy Mis M.D.   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 Interventional Radiology was called in for  emergent pelvic arteriogram and prostatic artery embolization due to uncontrollable bleeding in the operating room and transfusion requirement. Patient presents postoperative, intubated and stable for the time being. 3 units packed red blood cells have been transfused and bleeding appears to be tamponaded. We will proceed with emergent embolization. EXAM: IR ULTRASOUND GUIDANCE VASC ACCESS RIGHT; ADDITIONAL ARTERIOGRAPHY; IR EMBO ART VEN HEMORR LYMPH EXTRAV INC GUIDE ROADMAPPING; PELVIC SELECTIVE ARTERIOGRAPHY 1. Ultrasound-guided vascular access right common femoral artery 2. Catheterization of the left internal iliac artery with arteriogram 3. Catheterization of the prostatic artery with arteriogram 4. Particle embolization of the prosthetic artery. 5. Catheterization of the right external iliac artery with arteriogram 6. Catheterization of the right internal iliac artery  with arteriogram 7. Catheterization of the right gluteal pudendal trunk with arteriogram. 8. Catheterization of the right common trunk of the prosthetic artery and middle rectal artery with arteriogram 9. Catheterization of the right prosthetic artery with arteriogram 10. Coil embolization of the right prosthetic artery MEDICATIONS: None ANESTHESIA/SEDATION: General anesthesia performed by the anesthesiology service CONTRAST:  62mL OMNIPAQUE IOHEXOL 300 MG/ML  SOLN FLUOROSCOPY TIME:  Fluoroscopy Time: 17 minutes 0 seconds (2808 mGy). COMPLICATIONS: None immediate. PROCEDURE: Informed consent was obtained from the patient following explanation of the procedure, risks, benefits and alternatives. The patient understands, agrees and consents for the procedure. All questions were addressed. A time out was performed prior to the initiation of the procedure. Maximal barrier sterile technique utilized including caps, mask, sterile gowns, sterile gloves, large sterile drape, hand hygiene, and Betadine prep. The right common femoral artery was interrogated with ultrasound and found to be widely patent. An image was obtained and stored for the medical record. Local anesthesia was attained by infiltration with 1% lidocaine. A small dermatotomy was made. Under real-time sonographic guidance, the vessel was punctured with a 21 gauge micropuncture needle. Using standard technique, the initial micro needle was exchanged over a 0.018 micro wire for a transitional 4 [place] micro sheath. The micro sheath was then exchanged over a 0.035 wire for a 5 French vascular sheath. A C2 cobra catheter was advanced up in over the aortic bifurcation and used to select the left internal iliac artery. A left internal iliac arteriogram was performed. The origin of the prosthetic artery is identified and appears to a rise from a common trunk with the internal pudendal artery and middle rectal artery. Of note, no obturator artery is identified. The  left obturator artery may be replaced to the inferior epigastric artery. A Progreat outflow microcatheter was then advanced over a Fathom 16 wire and used to successfully select the prosthetic artery. Digital subtraction angiography demonstrates excellent parenchymal blush of the left hemi prostate. No evidence of collateral filling. Particle embolization was then performed to stasis using 100-300 micron embospheres. Post embolization arteriography demonstrates cessation of flow into the prosthetic artery. The middle rectal and internal pudendal arteries remain patent. The microcatheter was removed. The 5 French C2 cobra catheter was then formed into a Waltman loop in used to select the ipsilateral right external iliac artery. An arteriogram was performed to identify the origin of the internal iliac artery. The internal iliac artery was then selected. Arteriography was performed. The obturator is present on the right side. The origin of the prosthetic artery is identified arising from a the inferior gluteal artery which in turn arises from a gluteal pudendal trunk. The prosthetic artery was successfully advanced into the gluteal pudendal trunk and arteriography was performed. The origin of the prosthetic artery was  successfully identified. The microcatheter was then successfully advanced into the prosthetic artery. Upon the super selective digital subtraction angiography, the prosthetic artery also gives rise to the right-sided middle rectal artery. Arteriography was performed in multiple obliquities identifying that the middle rectal artery comes off fairly proximal on the prosthetic artery. The microcatheter was successfully advanced more distally within the prosthetic artery. Repeat angiography demonstrates excellent parenchymal blush of the prostate gland without evidence of reflux into the middle rectal artery. Particle embolization was then performed using 100-300 micron embospheres. Embolization was taken to  near stasis. The microcatheter was pulled back and arteriography was performed confirming continued patency of the middle rectal artery branches. The catheters were removed. Hemostasis was attained with the assistance of a 6 French Angio-Seal device. IMPRESSION: Successful bilateral prosthetic artery embolization. Electronically Signed   By: Jacqulynn Cadet M.D.   On: 10/03/2019 08:41   VAS [place] LOWER EXTREMITY VENOUS (DVT)  Result Date: 10/03/2019  Lower Venous DVTStudy Indications: Edema.  Comparison Study: No prior study Performing Technologist: Maudry Mayhew MHA, RDMS, RVT, RDCS  Examination Guidelines: A complete evaluation includes B-mode imaging, spectral Doppler, color Doppler, and power Doppler as needed of all accessible portions of each vessel. Bilateral testing is considered an integral part of a complete examination. Limited examinations for reoccurring indications may be performed as noted. The reflux portion of the exam is performed with the patient in reverse Trendelenburg.  +---------+---------------+---------+-----------+----------+--------------+  RIGHT     Compressibility Phasicity Spontaneity Properties Thrombus Aging  +---------+---------------+---------+-----------+----------+--------------+  CFV       Full            Yes       Yes                                    +---------+---------------+---------+-----------+----------+--------------+  SFJ       Full                                                             +---------+---------------+---------+-----------+----------+--------------+  FV Prox   Full                                                             +---------+---------------+---------+-----------+----------+--------------+  FV Mid    Full                                                             +---------+---------------+---------+-----------+----------+--------------+  FV Distal Full                                                              +---------+---------------+---------+-----------+----------+--------------+  PFV  Full                                                             +---------+---------------+---------+-----------+----------+--------------+  POP       Full            Yes       Yes                                    +---------+---------------+---------+-----------+----------+--------------+  PTV       Full                      Yes                                    +---------+---------------+---------+-----------+----------+--------------+   Right Technical Findings: Not visualized segments include peroneal veins.  +---------+---------------+---------+-----------+----------+--------------+  LEFT      Compressibility Phasicity Spontaneity Properties Thrombus Aging  +---------+---------------+---------+-----------+----------+--------------+  CFV       Full            Yes       Yes                                    +---------+---------------+---------+-----------+----------+--------------+  SFJ       Full                                                             +---------+---------------+---------+-----------+----------+--------------+  FV Prox   Full                                                             +---------+---------------+---------+-----------+----------+--------------+  FV Mid    Full                                                             +---------+---------------+---------+-----------+----------+--------------+  FV Distal Full                                                             +---------+---------------+---------+-----------+----------+--------------+  PFV       Full                                                             +---------+---------------+---------+-----------+----------+--------------+  POP       Full            Yes       Yes                                    +---------+---------------+---------+-----------+----------+--------------+  PTV       Full                      Yes                                     +---------+---------------+---------+-----------+----------+--------------+   Left Technical Findings: Not visualized segments include peroneal veins.   Summary: RIGHT: - There is no evidence of deep vein thrombosis in the lower extremity. However, portions of this examination were limited- see technologist comments above.  - No cystic structure found in the popliteal fossa.  LEFT: - There is no evidence of deep vein thrombosis in the lower extremity. However, portions of this examination were limited- see technologist comments above.  - No cystic structure found in the popliteal fossa.  *See table(s) above for measurements and observations. Electronically signed by Monica Martinez MD on 10/03/2019 at 2:02:39 PM.    Final     Labs:  CBC: Recent Labs    08/23/19 0818 08/23/19 0818 09/22/19 1221 09/22/19 1221 10/02/19 [id number] 10/02/19 2335 10/03/19 [id number] 10/03/19 0502 10/03/19 0558 10/03/19 0853  WBC 9.1  --  6.6  --  13.2*  --   --  8.3  --   --   HGB 6.6*   < > 10.6*   < > 7.1*   < > 8.8* 9.2* 8.8* 10.5*  HCT 26.8*   < > 37.7*   < > 24.6*   < > 26.0* 28.1* 26.0* 31.0*  PLT 430*  --  330  --  361  --   --  161   160  --   --    < > = values in this interval not displayed.    COAGS: Recent Labs    05/25/19 0450 10/02/19 0953 10/03/19 0502  INR 1.1 1.1 1.2  APTT  --   --  30    BMP: Recent Labs    08/23/19 0818 08/23/19 0818 09/22/19 1221 09/22/19 1221 10/02/19 [id number] 10/02/19 2335 10/03/19 0317 10/03/19 0502 10/03/19 0558 10/03/19 0853  NA 140   < > 137   < > 131*   < > 132* 134* 134* 134*  K 3.1*   < > 4.1   < > 4.2   < > 3.8 3.7 3.7 4.0  CL 104  --  103  --  97*  --   --  102  --   --   CO2 24  --  23  --  17*  --   --  22  --   --   GLUCOSE 107*  --  75  --  139*  --   --  159*  --   --   BUN 18  --  17  --  24*  --   --  25*  --   --   CALCIUM 8.6*  --  9.0  --  8.6*  --   --  8.4*  --   --   CREATININE 0.95  --  0.80  --  1.79*  --   --   1.34*  --   --   GFRNONAA >60  --  89  --  37*  --   --  52*  --   --   GFRAA >60  --  103  --  43*  --   --  >60  --   --    < > = values in this interval not displayed.    LIVER FUNCTION TESTS: Recent Labs    05/25/19 0207 05/25/19 0207 07/02/19 1446 09/22/19 1221 10/02/19 0953 10/03/19 0502  BILITOT 1.0   < > 0.4 0.5 0.8 1.4*  AST 24   < > 20 42* 46* 29  ALT 20   < > 22 30 35 24  ALKPHOS 49  --   --   --  95 58  PROT 6.2*   < > 7.2 7.3 6.5 4.7*  ALBUMIN 3.1*  --   --   --  3.1* 2.2*   < > = values in this interval not displayed.    Assessment and Plan:  74 y/o M s/p bilateral prostate artery embolization early this AM with Dr. Laurence Ferrari seen for right CFA site check. Patient remains sedated/ventilated on exam today. Right CFA puncture site soft, clean, dry, dressed appropriately without edema or bleeding noted. 3+ pitting edema to calf noted to RLE, unable to palpate distal pulses 2/2 edema and 1+ pitting edema to ankle noted to LLE, weak distal pulses palpated -- patient s/p 6 units PRBCs and 2 units FFP, CCM is following, BLE duplex pending. No overt bleeding noted so far today. Hgb 9.2, plt 160, DIC panel negative this AM.   Continue routine wound care to right CFA puncture site, further plans per primary team.   IR remains available as needed - please call with questions or concerns.  Electronically Signed: Joaquim Nam, PA-C 10/03/2019, 2:03 PM   I spent a total of 15 Minutes at the the patient's bedside AND on the patient's hospital floor or unit, greater than 50% of which was counseling/coordinating care for bilateral prostate artery embolization follow up.

## 2019-10-03 NOTE — Anesthesia Postprocedure Evaluation (Signed)
Anesthesia Post Note  Patient: [name] [name].  Procedure(s) Performed: CYSTOSCOPY WITH DIRECT VISION INTERNAL URETHROTOMY WITH CLOT EVACUATION retrograd urethralgram Coverted to Open. (N/A ) Open Cystostomy Suprapubic tube placement and JP drain and urethral dilation (N/A )     Patient location during evaluation: SICU Anesthesia Type: General Level of consciousness: sedated Pain management: pain level controlled Vital Signs Assessment: post-procedure vital signs reviewed and stable Respiratory status: patient remains intubated per anesthesia plan Cardiovascular status: stable Postop Assessment: no apparent nausea or vomiting Anesthetic complications: no    Last Vitals:  Vitals:   10/03/19 0645 10/03/19 0700  BP:  102/72  Pulse: 63 65  Resp: 11 11  Temp: (!) 34.4 C (!) 34.5 C  SpO2: 100% 100%    Last Pain:  Vitals:   10/03/19 0500  TempSrc: Esophageal  PainSc:                  Tiajuana Amass

## 2019-10-03 NOTE — Progress Notes (Signed)
[facility] Progress Note Patient Name: [name] [name]. DOB: 1945-08-07 MRN: [id number]   Date of Service  10/03/2019  HPI/Events of Note  Pt with hypotension, recent hemorrhagic shock due to hematuria, last reported hemoglobin 9.0, Pt also with hypoalbuminemia. Low blood pressure likely a volume issue.  eICU Interventions  Albumin 5 % 250 ml iv bolus x 1, Will f/u next H & H results which are due to be drawn now, PRN Phenylephrine infusion if blood pressure low after Albumin bolus pending H & H results.        Kerry Kass Taylr Meuth 10/03/2019, 8:09 PM

## 2019-10-04 DIAGNOSIS — R579 Shock, unspecified: Secondary | ICD-10-CM

## 2019-10-04 DIAGNOSIS — N179 Acute kidney failure, unspecified: Secondary | ICD-10-CM | POA: Diagnosis not present

## 2019-10-04 DIAGNOSIS — J95821 Acute postprocedural respiratory failure: Secondary | ICD-10-CM | POA: Diagnosis not present

## 2019-10-04 DIAGNOSIS — D62 Acute posthemorrhagic anemia: Secondary | ICD-10-CM | POA: Diagnosis not present

## 2019-10-04 LAB — PREPARE FRESH FROZEN PLASMA
Unit division: 0
Unit division: 0

## 2019-10-04 LAB — GLUCOSE, CAPILLARY
Glucose-Capillary: 120 mg/dL — ABNORMAL HIGH (ref 70–99)
Glucose-Capillary: 118 mg/dL — ABNORMAL HIGH (ref 70–99)
Glucose-Capillary: 64 mg/dL — ABNORMAL LOW (ref 70–99)
Glucose-Capillary: 116 mg/dL — ABNORMAL HIGH (ref 70–99)
Glucose-Capillary: 98 mg/dL (ref 70–99)

## 2019-10-04 LAB — BPAM FFP
Blood Product Expiration Date: 202105032359
Blood Product Expiration Date: 202105032359
ISSUE DATE / TIME: 202104300047
ISSUE DATE / TIME: 202104300047
Unit Type and Rh: 7300
Unit Type and Rh: 7300

## 2019-10-04 LAB — HEMOGLOBIN AND HEMATOCRIT, BLOOD
HCT: 25.6 % — ABNORMAL LOW (ref 39.0–52.0)
HCT: 27.2 % — ABNORMAL LOW (ref 39.0–52.0)
Hemoglobin: 8.3 g/dL — ABNORMAL LOW (ref 13.0–17.0)
Hemoglobin: 8.5 g/dL — ABNORMAL LOW (ref 13.0–17.0)

## 2019-10-04 MED ORDER — DOCUSATE SODIUM 50 MG/5ML PO LIQD
100.0000 mg | Freq: Two times a day (BID) | ORAL | Status: DC
  Administered 2019-10-04 – 2019-10-08 (×5): 100 mg via ORAL
  Filled 2019-10-04 (×7): qty 10

## 2019-10-04 MED ORDER — VITAMIN B-6 50 MG PO TABS
50.0000 mg | ORAL_TABLET | Freq: Every day | ORAL | Status: DC
  Administered 2019-10-04 – 2019-10-08 (×3): 50 mg via ORAL
  Filled 2019-10-04 (×5): qty 1

## 2019-10-04 MED ORDER — HYDROMORPHONE HCL 1 MG/ML IJ SOLN
0.5000 mg | Freq: Once | INTRAMUSCULAR | Status: AC
  Administered 2019-10-04: 0.5 mg via INTRAVENOUS
  Filled 2019-10-04: qty 0.5

## 2019-10-04 MED ORDER — VITAMIN B-12 1000 MCG PO TABS
1000.0000 ug | ORAL_TABLET | Freq: Every day | ORAL | Status: DC
  Administered 2019-10-04 – 2019-10-08 (×3): 1000 ug via ORAL
  Filled 2019-10-04 (×4): qty 1

## 2019-10-04 MED ORDER — FOLIC ACID 1 MG PO TABS
1.0000 mg | ORAL_TABLET | Freq: Every day | ORAL | Status: DC
  Administered 2019-10-04 – 2019-10-08 (×3): 1 mg via ORAL
  Filled 2019-10-04 (×4): qty 1

## 2019-10-04 MED ORDER — ASCORBIC ACID 500 MG PO TABS
1000.0000 mg | ORAL_TABLET | Freq: Every day | ORAL | Status: DC
  Administered 2019-10-04 – 2019-10-08 (×4): 1000 mg via ORAL
  Filled 2019-10-04 (×5): qty 2

## 2019-10-04 MED ORDER — POLYETHYLENE GLYCOL 3350 17 G PO PACK
17.0000 g | PACK | Freq: Every day | ORAL | Status: DC
  Administered 2019-10-04 – 2019-10-08 (×5): 17 g via ORAL
  Filled 2019-10-04 (×5): qty 1

## 2019-10-04 MED ORDER — METOPROLOL TARTRATE 25 MG PO TABS
12.5000 mg | ORAL_TABLET | Freq: Two times a day (BID) | ORAL | Status: DC
  Administered 2019-10-05 – 2019-10-08 (×7): 12.5 mg via ORAL
  Filled 2019-10-04 (×9): qty 1

## 2019-10-04 NOTE — Consult Note (Signed)
NAME:  [name] Vandemark., MRN:  [id number], DOB:  02/26/1946, LOS: 2 ADMISSION DATE:  10/02/2019, CONSULTATION DATE:  10/03/2019 REFERRING MD:  Dr. Gloriann Loan, CHIEF COMPLAINT:  Hematuria/ ABLA  Brief History   74 year old with BPH and placement of suprapubic catheter in early April after being unable to replace his chronic indwelling foley with ongoing hematuria and multiple visits for catheter obstruction.  Presented again with gross hematuria and catheter obstruction and now with ABLA with Hgb 7.1 and AKI taken to OR by urology with complex surgery requiring exploratory laparotomy with open placement of suprapubic catheter and urethral catheter placement requiring antegrade wire placement.  Large 1L clot evacuated with ongoing active bleeding requiring CBL and multiple blood products,  then transferred to IR for urgent prostatic artery embolization.  Patient to return to ICU on mechanical ventilation, PCCM consulted for further medical and ventilator management.   History of present illness   HPI obtained from medical chart review as patient is sedated and intubated on mechanical ventilation.   74 year old patient with prior chronic indwelling foley since 2014 with recent placement of suprapubic catheter, BPH, HTN, and vision impairment (still able to perform ADLs) who presented with hematuria and no drainage from his suprapubic catheter since 2 am on 4/29.   In early April, underwent suprapubic tube placement in [facility] for acute urinary retention after unsuccessful attempts to place urethral catheter due to stricture.  Seen 4/23 in ER for obstructed suprapubic catheter.  Urology unable to place urethral catheter and underwent bedside cystoscopy found to have pin hole anterior urethral stricture which would not accommodate wire placement.  Went to IR to replace suprapubic catheter.  He was discharged home after procedure with clear yellow urine output and to return in two weeks for catheter exchange  and upsize catheter.  He returned to ER on 4/26 for obstruction in the collection bag noted to have bloody urine.  Collection bag exchanged and patient discharged home with planned urology follow up.  He presented back to the ER 4/29 again after ongoing hematuria and catheter obstruction again with lower abdominal  distention and discomfort.  Noted to have frank blood in collection bag.  Denied NSAID use and not taking any anticoagulants.  Has long standing history of BPH previously treated with finasteride and noted on CT in 05/2019 to have markedly enlarged prostate measuring ~600 cc.    On ER evaluation, noted to have Hgb drop from 10.6 on 4/19 to 7.1, WBC 13.2, Na 131, sCr 1.79 (previously 0.8).  He was afebrile and hemodynamically stable.  Transfused with 2 units of PRBC.  Urology and IR consulted, [facility] admitting for acute blood loss anemia related to hematuria.  He was taken to the OR by urology.  Wire unable to be successfully passed through urethra requiring exploratory laparotomy and open suprapubic tube placement found to active bleeding from massive prostate with 1L clot evacuated after cystotomy.   A wire was able to passed antegrade and urethral catheter placed and started on continuous bladder irrigation due to bleeding.  EBL during surgery ~2L with multiple blood transfusions. Given active bleeding from prostate, patient transferred to IR for urgent prostatic artery embolization. Patient to return after IR to ICU on mechanical ventilation.  PCCM consulted for further medical and ventilator management.   Past Medical History  chronic urinary retention with indwelling catheter since 2014 s/p suprapubic catheter 09/2019,  urethral stricture, BPH, HTN, vision impairment  Significant Hospital Events   4/29 admitted  to [facility], to OR with urology then to IR, to ICU as patient remained intubated  Consults:  Urology IR  PCCM  Procedures:  4/29 w/ Urology  1.  Cystoscopy with attempted direct visual  internal ureterotomy 2.  Retrograde urethrogram  3.  Exploratory laparotomy with bladder clot evacuation and open suprapubic tube placement 4.  Urethral dilation with complex catheter placement over a wire  4/29 w/ IR Bilateral prostate artery embolization Right common femoral artery access  Drains/Catheters 10/02/2019 1.  22 French three-way urethral catheter 2.  20 French suprapubic catheter 3.  JP drain  4/29 ETT >> 4/29 R radial aline >>  Significant Diagnostic Tests:   Micro Data:  4/29 SARS 2/ Flu A/B >>  Antimicrobials:  4/29 cefazolin preop  Interim history/subjective:  Awake and weaning ventilator.  CBI output is pink tinged.    Objective   Blood pressure 124/68, pulse 81, temperature 98.2 F (36.8 C), resp. rate (!) 30, height 5\' 8"  (1.727 m), weight 86.1 kg, SpO2 100 %.    Vent Mode: PRVC FiO2 (%):  [30 %-40 %] 30 % Set Rate:  [11 bmp] 11 bmp Vt Set:  [540 mL] 540 mL PEEP:  [5 cmH20] 5 cmH20 Plateau Pressure:  [10 cmH20-14 cmH20] 13 cmH20   Intake/Output Summary (Last 24 hours) at 10/04/2019 0808 Last data filed at 10/04/2019 0600 Gross per 24 hour  Intake 7384.48 ml  Output 13430 ml  Net -6045.52 ml   Filed Weights   10/02/19 0328 10/04/19 0500  Weight: 86.2 kg 86.1 kg   Examination: General:  Chronically ill appearing older adult M, HEENT: NCAT ETT secure. R pupillary defect. Anicteric sclera  Neuro: awake, following commands CV: RRR s1s2, cap refill < 3 seconds  PULM:  Symmetrical chest expansion, CTA bilaterally  GI: soft, round. normactive lower abdominal incision cdi GU: suptrapubic and urethral catheter with blood tinged output. JP with serosanguinous output.  Extremities: Chronic venous changes BLE. No cyanosis or clubbing. BLE swelling R>L, 1+ pedal edema Skin:c/d/w  Resolved Hospital Problem list    Assessment & Plan:   Acute respiratory insufficiency post-operatively in setting of sedation/paralytics, remains intubated P:  On  precedex today. Will SBT and attempt ventilator liberation today.  VAP bundle   ABLA secondary to gross hematuria, BPH Chronic urinary retention with suprapubic catheter secondary to urethral stricture  - s/p complex surgery by Urology with open suprapubic catheter placement, massive prostate with 1L clot evacuated after cystotomy.   Placement of urethral catheter after antegrade wire placement, and CBI started.  EBL during surgery ~2L  - then to IR for urgent prostatic artery embolization 4/30 - s/p cumulative 6 units PRBC and 2 units FFP since admit P:  Per Urology and IR  Ongoing continuous bladder irrigation - d/c when ok with urology. Now pink tinged and Hgb stable Hgb stable, continue to trend and transfuse if <7  Circulatory Shock - on neosynephrine Likely secondary to sedation Will will as able based on extubation status.  AKI - likely due to prerenal r/t ABLA +/- obstructive due to blood clot P:  Trend renal indices, I/O Currently on continuous bladder irrigation per urology which is now pink tinged  HTN P:  ICU monitoring Holding home meds at this time, PRN metop ordered   Bilateral lower leg swelling, R> L P:   b/l DVT US negative for DVT   Best practice:  Diet: NPO tube feeds Pain/Anxiety/Delirium protocol (if indicated): on precedex VAP protocol (if indicated): yes DVT  prophylaxis: SCDs only  GI prophylaxis: PPI Glucose control: monitor Mobility: BR Code Status: full  Family Communication: will update today. Disposition: ICU   CRITICAL CARE The patient is critically ill with multiple organ systems failure and requires high complexity decision making for assessment and support, frequent evaluation and titration of therapies, application of advanced monitoring technologies and extensive interpretation of multiple databases.   Critical Care Time devoted to patient care services described in this note is 41 minutes. This time reflects time of care of this  [place] . This critical care time does not reflect separately billable procedures or procedure time, teaching time or supervisory time of PA/NP/Med student/Med Resident etc but could involve care discussion time.  [facility] 10/04/2019 8:10 AM  Pager: [phone] After hours pager: [phone]

## 2019-10-04 NOTE — Progress Notes (Signed)
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   Subjective: Self extubation overnight.   Afebrile, hemodynamically stable Unknown urine output given continuous bladder irrigation Right lower quadrant JP drain put out 80 cc since ER, 30 cc overnight Hemoglobin 8.3 from 8.5 yesterday. Creatinine 1.27 from 1.8 previously.  Overall, he says that he does not have much abdominal discomfort.  Had many questions about his catheters and intraoperative findings.  Objective: Vital signs in last 24 hours: Temp:  [95.9 F (35.5 C)-98.6 F (37 C)] 98.2 F (36.8 C) (05/01 0804) Pulse Rate:  [43-81] 81 (05/01 0804) Resp:  [7-30] 30 (05/01 0804) BP: (92-164)/(55-89) 124/68 (05/01 0804) SpO2:  [98 %-100 %] 100 % (05/01 0805) Arterial Line BP: (78-192)/(38-93) 141/61 (05/01 0800) FiO2 (%):  [30 %-40 %] 30 % (05/01 0805) Weight:  [86.1 kg] 86.1 kg (05/01 0500)  Intake/Output from previous day: 04/30 0701 - 05/01 0700 In: 7384.5 [I.V.:609.6; NG/GT:330; IV Piggyback:244.9] Out: [id number] [Urine:13350; Drains:80] Intake/Output this shift: Total I/O In: 146.5 [I.V.:86.5; NG/GT:60] Out: 800 [Urine:800]  Physical Exam:  General: Alert and oriented CV: Regular rate Abdomen: Soft, appropriately tender. Incisions c/d/i. JP SS GU: Foley in place draining clear light pink urine. SPT in place with CBI running on slow drip  Lab Results: Recent Labs    10/03/19 1601 10/03/19 2200 10/04/19 0347  HGB 9.0* 8.5* 8.3*  HCT 27.5* 26.3* 25.6*   Recent Labs    10/03/19 0502 10/03/19 0558 10/03/19 0853 10/03/19 2200  NA 134*   < > 134* 136  K 3.7   < > 4.0 4.1  CL 102  --   --  105  CO2 22  --   --  23  GLUCOSE 159*  --   --  162*  BUN 25*  --   --  25*  CREATININE  1.34*  --   --  1.27*  CALCIUM 8.4*  --   --  7.9*   < > = values in this interval not displayed.    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 Interventional Radiology was called in for emergent pelvic arteriogram and prostatic artery embolization due to uncontrollable bleeding in the operating room and transfusion requirement. Patient presents postoperative, intubated and stable for the time being. 3 units packed red blood cells have been transfused and bleeding appears to be tamponaded. We will proceed with emergent embolization. EXAM: IR ULTRASOUND GUIDANCE VASC ACCESS RIGHT; ADDITIONAL ARTERIOGRAPHY; IR EMBO ART VEN HEMORR LYMPH EXTRAV INC GUIDE ROADMAPPING; PELVIC SELECTIVE ARTERIOGRAPHY 1. Ultrasound-guided vascular access right common femoral artery 2. Catheterization of the left internal iliac artery with arteriogram 3. Catheterization of the prostatic artery with arteriogram 4. Particle embolization of the prosthetic artery. 5. Catheterization of the right external iliac artery with arteriogram 6. Catheterization of the right internal iliac artery with arteriogram 7. Catheterization of the right gluteal pudendal trunk with arteriogram. 8. Catheterization of the right common trunk of the prosthetic artery and middle rectal artery with arteriogram 9. Catheterization of the right prosthetic artery with arteriogram 10. Coil embolization of the right prosthetic artery MEDICATIONS: None ANESTHESIA/SEDATION: General anesthesia performed by the anesthesiology service CONTRAST:  21mL OMNIPAQUE IOHEXOL 300 MG/ML  SOLN FLUOROSCOPY TIME:  Fluoroscopy Time: 17 minutes 0 seconds (2808  mGy). COMPLICATIONS: None immediate. PROCEDURE: Informed consent was obtained from the patient following explanation of the procedure, risks, benefits and alternatives. The patient understands, agrees and consents  for the procedure. All questions were addressed. A time out was performed prior to the initiation of the procedure. Maximal barrier sterile technique utilized including caps, mask, sterile gowns, sterile gloves, large sterile drape, hand hygiene, and Betadine prep. The right common femoral artery was interrogated with ultrasound and found to be widely patent. An image was obtained and stored for the medical record. Local anesthesia was attained by infiltration with 1% lidocaine. A small dermatotomy was made. Under real-time sonographic guidance, the vessel was punctured with a 21 gauge micropuncture needle. Using standard technique, the initial micro needle was exchanged over a 0.018 micro wire for a transitional 4 [place] micro sheath. The micro sheath was then exchanged over a 0.035 wire for a 5 French vascular sheath. A C2 cobra catheter was advanced up in over the aortic bifurcation and used to select the left internal iliac artery. A left internal iliac arteriogram was performed. The origin of the prosthetic artery is identified and appears to a rise from a common trunk with the internal pudendal artery and middle rectal artery. Of note, no obturator artery is identified. The left obturator artery may be replaced to the inferior epigastric artery. A Progreat outflow microcatheter was then advanced over a Fathom 16 wire and used to successfully select the prosthetic artery. Digital subtraction angiography demonstrates excellent parenchymal blush of the left hemi prostate. No evidence of collateral filling. Particle embolization was then performed to stasis using 100-300 micron embospheres. Post embolization arteriography demonstrates cessation of flow into the prosthetic artery. The middle rectal and internal pudendal arteries remain patent. The microcatheter was removed. The 5 French C2 cobra catheter was then formed into a Waltman loop in used to select the ipsilateral right external iliac artery. An  arteriogram was performed to identify the origin of the internal iliac artery. The internal iliac artery was then selected. Arteriography was performed. The obturator is present on the right side. The origin of the prosthetic artery is identified arising from a the inferior gluteal artery which in turn arises from a gluteal pudendal trunk. The prosthetic artery was successfully advanced into the gluteal pudendal trunk and arteriography was performed. The origin of the prosthetic artery was successfully identified. The microcatheter was then successfully advanced into the prosthetic artery. Upon the super selective digital subtraction angiography, the prosthetic artery also gives rise to the right-sided middle rectal artery. Arteriography was performed in multiple obliquities identifying that the middle rectal artery comes off fairly proximal on the prosthetic artery. The microcatheter was successfully advanced more distally within the prosthetic artery. Repeat angiography demonstrates excellent parenchymal blush of the prostate gland without evidence of reflux into the middle rectal artery. Particle embolization was then performed using 100-300 micron embospheres. Embolization was taken to near stasis. The microcatheter was pulled back and arteriography was performed confirming continued patency of the middle rectal artery branches. The catheters were removed. Hemostasis was attained with the assistance of a 6 French Angio-Seal device. IMPRESSION: Successful bilateral prosthetic artery embolization. Electronically Signed   By: Jacqulynn Cadet M.D.   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 Interventional Radiology was called in for emergent pelvic arteriogram and prostatic artery embolization due to uncontrollable bleeding in the operating room and transfusion requirement. Patient  presents  postoperative, intubated and stable for the time being. 3 units packed red blood cells have been transfused and bleeding appears to be tamponaded. We will proceed with emergent embolization. EXAM: IR ULTRASOUND GUIDANCE VASC ACCESS RIGHT; ADDITIONAL ARTERIOGRAPHY; IR EMBO ART VEN HEMORR LYMPH EXTRAV INC GUIDE ROADMAPPING; PELVIC SELECTIVE ARTERIOGRAPHY 1. Ultrasound-guided vascular access right common femoral artery 2. Catheterization of the left internal iliac artery with arteriogram 3. Catheterization of the prostatic artery with arteriogram 4. Particle embolization of the prosthetic artery. 5. Catheterization of the right external iliac artery with arteriogram 6. Catheterization of the right internal iliac artery with arteriogram 7. Catheterization of the right gluteal pudendal trunk with arteriogram. 8. Catheterization of the right common trunk of the prosthetic artery and middle rectal artery with arteriogram 9. Catheterization of the right prosthetic artery with arteriogram 10. Coil embolization of the right prosthetic artery MEDICATIONS: None ANESTHESIA/SEDATION: General anesthesia performed by the anesthesiology service CONTRAST:  59mL OMNIPAQUE IOHEXOL 300 MG/ML  SOLN FLUOROSCOPY TIME:  Fluoroscopy Time: 17 minutes 0 seconds (2808 mGy). COMPLICATIONS: None immediate. PROCEDURE: Informed consent was obtained from the patient following explanation of the procedure, risks, benefits and alternatives. The patient understands, agrees and consents for the procedure. All questions were addressed. A time out was performed prior to the initiation of the procedure. Maximal barrier sterile technique utilized including caps, mask, sterile gowns, sterile gloves, large sterile drape, hand hygiene, and Betadine prep. The right common femoral artery was interrogated with ultrasound and found to be widely patent. An image was obtained and stored for the medical record. Local anesthesia was attained by infiltration  with 1% lidocaine. A small dermatotomy was made. Under real-time sonographic guidance, the vessel was punctured with a 21 gauge micropuncture needle. Using standard technique, the initial micro needle was exchanged over a 0.018 micro wire for a transitional 4 [place] micro sheath. The micro sheath was then exchanged over a 0.035 wire for a 5 French vascular sheath. A C2 cobra catheter was advanced up in over the aortic bifurcation and used to select the left internal iliac artery. A left internal iliac arteriogram was performed. The origin of the prosthetic artery is identified and appears to a rise from a common trunk with the internal pudendal artery and middle rectal artery. Of note, no obturator artery is identified. The left obturator artery may be replaced to the inferior epigastric artery. A Progreat outflow microcatheter was then advanced over a Fathom 16 wire and used to successfully select the prosthetic artery. Digital subtraction angiography demonstrates excellent parenchymal blush of the left hemi prostate. No evidence of collateral filling. Particle embolization was then performed to stasis using 100-300 micron embospheres. Post embolization arteriography demonstrates cessation of flow into the prosthetic artery. The middle rectal and internal pudendal arteries remain patent. The microcatheter was removed. The 5 French C2 cobra catheter was then formed into a Waltman loop in used to select the ipsilateral right external iliac artery. An arteriogram was performed to identify the origin of the internal iliac artery. The internal iliac artery was then selected. Arteriography was performed. The obturator is present on the right side. The origin of the prosthetic artery is identified arising from a the inferior gluteal artery which in turn arises from a gluteal pudendal trunk. The prosthetic artery was successfully advanced into the gluteal pudendal trunk and arteriography was performed. The origin of the  prosthetic artery was successfully identified. The microcatheter was then successfully advanced into the prosthetic artery. Upon the super selective digital subtraction angiography, the  prosthetic artery also gives rise to the right-sided middle rectal artery. Arteriography was performed in multiple obliquities identifying that the middle rectal artery comes off fairly proximal on the prosthetic artery. The microcatheter was successfully advanced more distally within the prosthetic artery. Repeat angiography demonstrates excellent parenchymal blush of the prostate gland without evidence of reflux into the middle rectal artery. Particle embolization was then performed using 100-300 micron embospheres. Embolization was taken to near stasis. The microcatheter was pulled back and arteriography was performed confirming continued patency of the middle rectal artery branches. The catheters were removed. Hemostasis was attained with the assistance of a 6 French Angio-Seal device. IMPRESSION: Successful bilateral prosthetic artery embolization. Electronically Signed   By: Jacqulynn Cadet M.D.   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 Interventional Radiology was called in for emergent pelvic arteriogram and prostatic artery embolization due to uncontrollable bleeding in the operating room and transfusion requirement. Patient presents postoperative, intubated and stable for the time being. 3 units packed red blood cells have been transfused and bleeding appears to be tamponaded. We will proceed with emergent embolization. EXAM: IR ULTRASOUND GUIDANCE VASC ACCESS RIGHT; ADDITIONAL ARTERIOGRAPHY; IR EMBO ART VEN HEMORR LYMPH EXTRAV INC GUIDE ROADMAPPING; PELVIC SELECTIVE ARTERIOGRAPHY 1. Ultrasound-guided vascular access right common femoral artery 2. Catheterization of the left internal iliac  artery with arteriogram 3. Catheterization of the prostatic artery with arteriogram 4. Particle embolization of the prosthetic artery. 5. Catheterization of the right external iliac artery with arteriogram 6. Catheterization of the right internal iliac artery with arteriogram 7. Catheterization of the right gluteal pudendal trunk with arteriogram. 8. Catheterization of the right common trunk of the prosthetic artery and middle rectal artery with arteriogram 9. Catheterization of the right prosthetic artery with arteriogram 10. Coil embolization of the right prosthetic artery MEDICATIONS: None ANESTHESIA/SEDATION: General anesthesia performed by the anesthesiology service CONTRAST:  29mL OMNIPAQUE IOHEXOL 300 MG/ML  SOLN FLUOROSCOPY TIME:  Fluoroscopy Time: 17 minutes 0 seconds (2808 mGy). COMPLICATIONS: None immediate. PROCEDURE: Informed consent was obtained from the patient following explanation of the procedure, risks, benefits and alternatives. The patient understands, agrees and consents for the procedure. All questions were addressed. A time out was performed prior to the initiation of the procedure. Maximal barrier sterile technique utilized including caps, mask, sterile gowns, sterile gloves, large sterile drape, hand hygiene, and Betadine prep. The right common femoral artery was interrogated with ultrasound and found to be widely patent. An image was obtained and stored for the medical record. Local anesthesia was attained by infiltration with 1% lidocaine. A small dermatotomy was made. Under real-time sonographic guidance, the vessel was punctured with a 21 gauge micropuncture needle. Using standard technique, the initial micro needle was exchanged over a 0.018 micro wire for a transitional 4 [place] micro sheath. The micro sheath was then exchanged over a 0.035 wire for a 5 French vascular sheath. A C2 cobra catheter was advanced up in over the aortic bifurcation and used to select the left internal iliac  artery. A left internal iliac arteriogram was performed. The origin of the prosthetic artery is identified and appears to a rise from a common trunk with the internal pudendal artery and middle rectal artery. Of note, no obturator artery is identified. The left obturator artery may be replaced to the inferior epigastric artery. A Progreat outflow microcatheter was then advanced over a Fathom 16 wire and used to successfully  select the prosthetic artery. Digital subtraction angiography demonstrates excellent parenchymal blush of the left hemi prostate. No evidence of collateral filling. Particle embolization was then performed to stasis using 100-300 micron embospheres. Post embolization arteriography demonstrates cessation of flow into the prosthetic artery. The middle rectal and internal pudendal arteries remain patent. The microcatheter was removed. The 5 French C2 cobra catheter was then formed into a Waltman loop in used to select the ipsilateral right external iliac artery. An arteriogram was performed to identify the origin of the internal iliac artery. The internal iliac artery was then selected. Arteriography was performed. The obturator is present on the right side. The origin of the prosthetic artery is identified arising from a the inferior gluteal artery which in turn arises from a gluteal pudendal trunk. The prosthetic artery was successfully advanced into the gluteal pudendal trunk and arteriography was performed. The origin of the prosthetic artery was successfully identified. The microcatheter was then successfully advanced into the prosthetic artery. Upon the super selective digital subtraction angiography, the prosthetic artery also gives rise to the right-sided middle rectal artery. Arteriography was performed in multiple obliquities identifying that the middle rectal artery comes off fairly proximal on the prosthetic artery. The microcatheter was successfully advanced more distally within the  prosthetic artery. Repeat angiography demonstrates excellent parenchymal blush of the prostate gland without evidence of reflux into the middle rectal artery. Particle embolization was then performed using 100-300 micron embospheres. Embolization was taken to near stasis. The microcatheter was pulled back and arteriography was performed confirming continued patency of the middle rectal artery branches. The catheters were removed. Hemostasis was attained with the assistance of a 6 French Angio-Seal device. IMPRESSION: Successful bilateral prosthetic artery embolization. Electronically Signed   By: Jacqulynn Cadet M.D.   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 Interventional Radiology was called in for emergent pelvic arteriogram and prostatic artery embolization due to uncontrollable bleeding in the operating room and transfusion requirement. Patient presents postoperative, intubated and stable for the time being. 3 units packed red blood cells have been transfused and bleeding appears to be tamponaded. We will proceed with emergent embolization. EXAM: IR ULTRASOUND GUIDANCE VASC ACCESS RIGHT; ADDITIONAL ARTERIOGRAPHY; IR EMBO ART VEN HEMORR LYMPH EXTRAV INC GUIDE ROADMAPPING; PELVIC SELECTIVE ARTERIOGRAPHY 1. Ultrasound-guided vascular access right common femoral artery 2. Catheterization of the left internal iliac artery with arteriogram 3. Catheterization of the prostatic artery with arteriogram 4. Particle embolization of the prosthetic artery. 5. Catheterization of the right external iliac artery with arteriogram 6. Catheterization of the right internal iliac artery with arteriogram 7. Catheterization of the right gluteal pudendal trunk with arteriogram. 8. Catheterization of the right common trunk of the prosthetic artery and middle rectal artery with arteriogram 9.  Catheterization of the right prosthetic artery with arteriogram 10. Coil embolization of the right prosthetic artery MEDICATIONS: None ANESTHESIA/SEDATION: General anesthesia performed by the anesthesiology service CONTRAST:  43mL OMNIPAQUE IOHEXOL 300 MG/ML  SOLN FLUOROSCOPY TIME:  Fluoroscopy Time: 17 minutes 0 seconds (2808 mGy). COMPLICATIONS: None immediate. PROCEDURE: Informed consent was obtained from the patient following explanation of the procedure, risks, benefits and alternatives. The patient understands, agrees and consents for the procedure. All questions were addressed. A time out was performed prior to the initiation of the procedure. Maximal barrier sterile technique utilized including caps, mask, sterile gowns, sterile gloves, large sterile drape, hand hygiene, and Betadine prep. The right common  femoral artery was interrogated with ultrasound and found to be widely patent. An image was obtained and stored for the medical record. Local anesthesia was attained by infiltration with 1% lidocaine. A small dermatotomy was made. Under real-time sonographic guidance, the vessel was punctured with a 21 gauge micropuncture needle. Using standard technique, the initial micro needle was exchanged over a 0.018 micro wire for a transitional 4 [place] micro sheath. The micro sheath was then exchanged over a 0.035 wire for a 5 French vascular sheath. A C2 cobra catheter was advanced up in over the aortic bifurcation and used to select the left internal iliac artery. A left internal iliac arteriogram was performed. The origin of the prosthetic artery is identified and appears to a rise from a common trunk with the internal pudendal artery and middle rectal artery. Of note, no obturator artery is identified. The left obturator artery may be replaced to the inferior epigastric artery. A Progreat outflow microcatheter was then advanced over a Fathom 16 wire and used to successfully select the prosthetic artery.  Digital subtraction angiography demonstrates excellent parenchymal blush of the left hemi prostate. No evidence of collateral filling. Particle embolization was then performed to stasis using 100-300 micron embospheres. Post embolization arteriography demonstrates cessation of flow into the prosthetic artery. The middle rectal and internal pudendal arteries remain patent. The microcatheter was removed. The 5 French C2 cobra catheter was then formed into a Waltman loop in used to select the ipsilateral right external iliac artery. An arteriogram was performed to identify the origin of the internal iliac artery. The internal iliac artery was then selected. Arteriography was performed. The obturator is present on the right side. The origin of the prosthetic artery is identified arising from a the inferior gluteal artery which in turn arises from a gluteal pudendal trunk. The prosthetic artery was successfully advanced into the gluteal pudendal trunk and arteriography was performed. The origin of the prosthetic artery was successfully identified. The microcatheter was then successfully advanced into the prosthetic artery. Upon the super selective digital subtraction angiography, the prosthetic artery also gives rise to the right-sided middle rectal artery. Arteriography was performed in multiple obliquities identifying that the middle rectal artery comes off fairly proximal on the prosthetic artery. The microcatheter was successfully advanced more distally within the prosthetic artery. Repeat angiography demonstrates excellent parenchymal blush of the prostate gland without evidence of reflux into the middle rectal artery. Particle embolization was then performed using 100-300 micron embospheres. Embolization was taken to near stasis. The microcatheter was pulled back and arteriography was performed confirming continued patency of the middle rectal artery branches. The catheters were removed. Hemostasis was attained  with the assistance of a 6 French Angio-Seal device. IMPRESSION: Successful bilateral prosthetic artery embolization. Electronically Signed   By: Jacqulynn Cadet M.D.   On: 10/03/2019 08:41   DG Cystogram  Result Date: 10/03/2019 CLINICAL DATA:  Intraoperative imaging for marked benign prostatic hyperplasia and urinary retention. EXAM: CYSTOGRAM TECHNIQUE: Single image from an attempted intraoperative retrograde cystourethrogram. FLUOROSCOPY TIME:  Fluoroscopy Time:  22 seconds Radiation Exposure Index (if provided by the fluoroscopic device): Not applicable. Number of Acquired Spot Images: 0 COMPARISON:  Operative note of same date. FINDINGS: A single image from an attempted retrograde cystourethrogram was submitted. This is limited, secondary to positioning. This demonstrates a stricture within the mid (likely prostatic) urethra. The more proximal urethra is dilated. Only minimal contrast is identified presumably within the urinary bladder. Lymphovascular extravasation of contrast identified. IMPRESSION: Limited, attempted cystourethrogram  as detailed above. 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 Lack of significant opacification of the urinary bladder. Electronically Signed   By: Abigail Miyamoto M.D.   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 Interventional Radiology was called in for emergent pelvic arteriogram and prostatic artery embolization due to uncontrollable bleeding in the operating room and transfusion requirement. Patient presents postoperative, intubated and stable for the time being. 3 units packed red blood cells have been transfused and bleeding appears to be tamponaded. We will proceed with emergent embolization. EXAM: IR ULTRASOUND GUIDANCE VASC ACCESS RIGHT; ADDITIONAL ARTERIOGRAPHY; IR EMBO ART VEN HEMORR LYMPH EXTRAV INC GUIDE ROADMAPPING; PELVIC SELECTIVE  ARTERIOGRAPHY 1. Ultrasound-guided vascular access right common femoral artery 2. Catheterization of the left internal iliac artery with arteriogram 3. Catheterization of the prostatic artery with arteriogram 4. Particle embolization of the prosthetic artery. 5. Catheterization of the right external iliac artery with arteriogram 6. Catheterization of the right internal iliac artery with arteriogram 7. Catheterization of the right gluteal pudendal trunk with arteriogram. 8. Catheterization of the right common trunk of the prosthetic artery and middle rectal artery with arteriogram 9. Catheterization of the right prosthetic artery with arteriogram 10. Coil embolization of the right prosthetic artery MEDICATIONS: None ANESTHESIA/SEDATION: General anesthesia performed by the anesthesiology service CONTRAST:  1mL OMNIPAQUE IOHEXOL 300 MG/ML  SOLN FLUOROSCOPY TIME:  Fluoroscopy Time: 17 minutes 0 seconds (2808 mGy). COMPLICATIONS: None immediate. PROCEDURE: Informed consent was obtained from the patient following explanation of the procedure, risks, benefits and alternatives. The patient understands, agrees and consents for the procedure. All questions were addressed. A time out was performed prior to the initiation of the procedure. Maximal barrier sterile technique utilized including caps, mask, sterile gowns, sterile gloves, large sterile drape, hand hygiene, and Betadine prep. The right common femoral artery was interrogated with ultrasound and found to be widely patent. An image was obtained and stored for the medical record. Local anesthesia was attained by infiltration with 1% lidocaine. A small dermatotomy was made. Under real-time sonographic guidance, the vessel was punctured with a 21 gauge micropuncture needle. Using standard technique, the initial micro needle was exchanged over a 0.018 micro wire for a transitional 4 [place] micro sheath. The micro sheath was then exchanged over a 0.035 wire for a 5 French  vascular sheath. A C2 cobra catheter was advanced up in over the aortic bifurcation and used to select the left internal iliac artery. A left internal iliac arteriogram was performed. The origin of the prosthetic artery is identified and appears to a rise from a common trunk with the internal pudendal artery and middle rectal artery. Of note, no obturator artery is identified. The left obturator artery may be replaced to the inferior epigastric artery. A Progreat outflow microcatheter was then advanced over a Fathom 16 wire and used to successfully select the prosthetic artery. Digital subtraction angiography demonstrates excellent parenchymal blush of the left hemi prostate. No evidence of collateral filling. Particle embolization was then performed to stasis using 100-300 micron embospheres. Post embolization arteriography demonstrates cessation of flow into the prosthetic artery. The middle rectal and internal pudendal arteries remain patent. The microcatheter was removed. The 5 French C2 cobra catheter was then formed into a Waltman loop in used to select the ipsilateral right external iliac artery. An arteriogram was performed to identify the origin of the internal iliac artery. The internal iliac artery was then  selected. Arteriography was performed. The obturator is present on the right side. The origin of the prosthetic artery is identified arising from a the inferior gluteal artery which in turn arises from a gluteal pudendal trunk. The prosthetic artery was successfully advanced into the gluteal pudendal trunk and arteriography was performed. The origin of the prosthetic artery was successfully identified. The microcatheter was then successfully advanced into the prosthetic artery. Upon the super selective digital subtraction angiography, the prosthetic artery also gives rise to the right-sided middle rectal artery. Arteriography was performed in multiple obliquities identifying that the middle rectal  artery comes off fairly proximal on the prosthetic artery. The microcatheter was successfully advanced more distally within the prosthetic artery. Repeat angiography demonstrates excellent parenchymal blush of the prostate gland without evidence of reflux into the middle rectal artery. Particle embolization was then performed using 100-300 micron embospheres. Embolization was taken to near stasis. The microcatheter was pulled back and arteriography was performed confirming continued patency of the middle rectal artery branches. The catheters were removed. Hemostasis was attained with the assistance of a 6 French Angio-Seal device. IMPRESSION: Successful bilateral prosthetic artery embolization. Electronically Signed   By: Jacqulynn Cadet M.D.   On: 10/03/2019 08:41   DG Chest Port 1 View  Result Date: 10/03/2019 CLINICAL DATA:  Intubation EXAM: PORTABLE CHEST 1 VIEW COMPARISON:  None. FINDINGS: Endotracheal tube with tip halfway between the clavicular heads and carina, 2.5 cm above the carina. The enteric tube and thermistor reaches the stomach. Generous heart size likely from low volumes. There is no edema, consolidation, effusion, or pneumothorax. IMPRESSION: Unremarkable hardware positioning and low volume but clear lungs. Electronically Signed   By: Monte Fantasia M.D.   On: 10/03/2019 05:41   DG Abd Portable 1V  Result Date: 10/03/2019 CLINICAL DATA:  NG tube placement EXAM: PORTABLE ABDOMEN - 1 VIEW COMPARISON:  None. FINDINGS: Enteric tube terminates within the stomach. Included bowel gas pattern is unremarkable. IMPRESSION: Enteric tube within the stomach. Electronically Signed   By: Macy Mis M.D.   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 Interventional Radiology was called in for emergent pelvic arteriogram and prostatic artery embolization due to  uncontrollable bleeding in the operating room and transfusion requirement. Patient presents postoperative, intubated and stable for the time being. 3 units packed red blood cells have been transfused and bleeding appears to be tamponaded. We will proceed with emergent embolization. EXAM: IR ULTRASOUND GUIDANCE VASC ACCESS RIGHT; ADDITIONAL ARTERIOGRAPHY; IR EMBO ART VEN HEMORR LYMPH EXTRAV INC GUIDE ROADMAPPING; PELVIC SELECTIVE ARTERIOGRAPHY 1. Ultrasound-guided vascular access right common femoral artery 2. Catheterization of the left internal iliac artery with arteriogram 3. Catheterization of the prostatic artery with arteriogram 4. Particle embolization of the prosthetic artery. 5. Catheterization of the right external iliac artery with arteriogram 6. Catheterization of the right internal iliac artery with arteriogram 7. Catheterization of the right gluteal pudendal trunk with arteriogram. 8. Catheterization of the right common trunk of the prosthetic artery and middle rectal artery with arteriogram 9. Catheterization of the right prosthetic artery with arteriogram 10. Coil embolization of the right prosthetic artery MEDICATIONS: None ANESTHESIA/SEDATION: General anesthesia performed by the anesthesiology service CONTRAST:  52mL OMNIPAQUE IOHEXOL 300 MG/ML  SOLN FLUOROSCOPY TIME:  Fluoroscopy Time: 17 minutes 0 seconds (2808 mGy). COMPLICATIONS: None immediate. PROCEDURE: Informed consent was obtained from the patient following explanation of the  procedure, risks, benefits and alternatives. The patient understands, agrees and consents for the procedure. All questions were addressed. A time out was performed prior to the initiation of the procedure. Maximal barrier sterile technique utilized including caps, mask, sterile gowns, sterile gloves, large sterile drape, hand hygiene, and Betadine prep. The right common femoral artery was interrogated with ultrasound and found to be widely patent. An image was obtained  and stored for the medical record. Local anesthesia was attained by infiltration with 1% lidocaine. A small dermatotomy was made. Under real-time sonographic guidance, the vessel was punctured with a 21 gauge micropuncture needle. Using standard technique, the initial micro needle was exchanged over a 0.018 micro wire for a transitional 4 [place] micro sheath. The micro sheath was then exchanged over a 0.035 wire for a 5 French vascular sheath. A C2 cobra catheter was advanced up in over the aortic bifurcation and used to select the left internal iliac artery. A left internal iliac arteriogram was performed. The origin of the prosthetic artery is identified and appears to a rise from a common trunk with the internal pudendal artery and middle rectal artery. Of note, no obturator artery is identified. The left obturator artery may be replaced to the inferior epigastric artery. A Progreat outflow microcatheter was then advanced over a Fathom 16 wire and used to successfully select the prosthetic artery. Digital subtraction angiography demonstrates excellent parenchymal blush of the left hemi prostate. No evidence of collateral filling. Particle embolization was then performed to stasis using 100-300 micron embospheres. Post embolization arteriography demonstrates cessation of flow into the prosthetic artery. The middle rectal and internal pudendal arteries remain patent. The microcatheter was removed. The 5 French C2 cobra catheter was then formed into a Waltman loop in used to select the ipsilateral right external iliac artery. An arteriogram was performed to identify the origin of the internal iliac artery. The internal iliac artery was then selected. Arteriography was performed. The obturator is present on the right side. The origin of the prosthetic artery is identified arising from a the inferior gluteal artery which in turn arises from a gluteal pudendal trunk. The prosthetic artery was successfully advanced into  the gluteal pudendal trunk and arteriography was performed. The origin of the prosthetic artery was successfully identified. The microcatheter was then successfully advanced into the prosthetic artery. Upon the super selective digital subtraction angiography, the prosthetic artery also gives rise to the right-sided middle rectal artery. Arteriography was performed in multiple obliquities identifying that the middle rectal artery comes off fairly proximal on the prosthetic artery. The microcatheter was successfully advanced more distally within the prosthetic artery. Repeat angiography demonstrates excellent parenchymal blush of the prostate gland without evidence of reflux into the middle rectal artery. Particle embolization was then performed using 100-300 micron embospheres. Embolization was taken to near stasis. The microcatheter was pulled back and arteriography was performed confirming continued patency of the middle rectal artery branches. The catheters were removed. Hemostasis was attained with the assistance of a 6 French Angio-Seal device. IMPRESSION: Successful bilateral prosthetic artery embolization. Electronically Signed   By: Jacqulynn Cadet M.D.   On: 10/03/2019 08:41   VAS [place] LOWER EXTREMITY VENOUS (DVT)  Result Date: 10/03/2019  Lower Venous DVTStudy Indications: Edema.  Comparison Study: No prior study Performing Technologist: Maudry Mayhew MHA, RDMS, RVT, RDCS  Examination Guidelines: A complete evaluation includes B-mode imaging, spectral Doppler, color Doppler, and power Doppler as needed of all accessible portions of each vessel. Bilateral testing is considered an integral  part of a complete examination. Limited examinations for reoccurring indications may be performed as noted. The reflux portion of the exam is performed with the patient in reverse Trendelenburg.  +---------+---------------+---------+-----------+----------+--------------+ RIGHT     CompressibilityPhasicitySpontaneityPropertiesThrombus Aging +---------+---------------+---------+-----------+----------+--------------+ CFV      Full           Yes      Yes                                 +---------+---------------+---------+-----------+----------+--------------+ SFJ      Full                                                        +---------+---------------+---------+-----------+----------+--------------+ FV Prox  Full                                                        +---------+---------------+---------+-----------+----------+--------------+ FV Mid   Full                                                        +---------+---------------+---------+-----------+----------+--------------+ FV DistalFull                                                        +---------+---------------+---------+-----------+----------+--------------+ PFV      Full                                                        +---------+---------------+---------+-----------+----------+--------------+ POP      Full           Yes      Yes                                 +---------+---------------+---------+-----------+----------+--------------+ PTV      Full                    Yes                                 +---------+---------------+---------+-----------+----------+--------------+   Right Technical Findings: Not visualized segments include peroneal veins.  +---------+---------------+---------+-----------+----------+--------------+ LEFT     CompressibilityPhasicitySpontaneityPropertiesThrombus Aging +---------+---------------+---------+-----------+----------+--------------+ CFV      Full           Yes      Yes                                 +---------+---------------+---------+-----------+----------+--------------+ SFJ  Full                                                         +---------+---------------+---------+-----------+----------+--------------+ FV Prox  Full                                                        +---------+---------------+---------+-----------+----------+--------------+ FV Mid   Full                                                        +---------+---------------+---------+-----------+----------+--------------+ FV DistalFull                                                        +---------+---------------+---------+-----------+----------+--------------+ PFV      Full                                                        +---------+---------------+---------+-----------+----------+--------------+ POP      Full           Yes      Yes                                 +---------+---------------+---------+-----------+----------+--------------+ PTV      Full                    Yes                                 +---------+---------------+---------+-----------+----------+--------------+   Left Technical Findings: Not visualized segments include peroneal veins.   Summary: RIGHT: - There is no evidence of deep vein thrombosis in the lower extremity. However, portions of this examination were limited- see technologist comments above.  - No cystic structure found in the popliteal fossa.  LEFT: - There is no evidence of deep vein thrombosis in the lower extremity. However, portions of this examination were limited- see technologist comments above.  - No cystic structure found in the popliteal fossa.  *See table(s) above for measurements and observations. Electronically signed by Monica Martinez MD on 10/03/2019 at 2:02:39 PM.    Final     Assessment/Plan:  74 y.o. male s/p cystoscopy, exploratory laparotomy with cystotomy, bladder clot evacuation, open suprapubic tube placement, drain placement, urethral dilation in the setting of bulbar urethral stricture, gross hematuria and massively enlarged prostate.   Had many questions  about his catheters and intraoperative findings.  These were all answered to his apparent satisfaction  Gross Hematuria Etiology of gross hematuria was an almost undoubtably from his massively enlarged  prostate.  Open controlled cystotomy with intravesical clot removal and open suprapubic tube placement 10/03/2019. Appreciate interventional radiology assistance with prostatic artery embolization bilaterally.  -Bleeding has appeared to slow considerably. -Hemoglobin appears stable. -Ideally, will wean CBI.  Once CBI is off and catheter remains draining clear yellow, will plan to Suprapubic tube remain in place. -Continue to follow JP drain clinically at this time.  BPH Patient known to have a massively enlarged prostate with multiple prior episodes of acute urinary retention.  Underwent prostatic artery embolization on October 03, 2019 in the setting of gross hematuria, clot urinary retention requiring transfusions.  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  -Continue Foley catheter at this time, as above  Dispo: Appreciate ICU assistance.   LOS: 2 days

## 2019-10-04 NOTE — Progress Notes (Signed)
Before patient was transferred to [facility], patient indicated that he had his personal cane with his belongings.  "It's silver with black handle," per patient.  Only belongings found by me were his clothing, shoes, jacket, and cellphone.  I called ED to attempt to locate cane, but was told that it was brought up with patient on admission.  Today is the first day I have this patient, so I am unaware of what else was in his room the previous days.  Will notify  [facility] about missing cane.

## 2019-10-04 NOTE — Progress Notes (Signed)
Hypoglycemic Event  CBG: 64  Treatment: 8 oz juice/soda  Symptoms: None  Follow-up CBG: Time:1145 CBG Result:116  Possible Reasons for Event: Inadequate meal intake  Comments/MD notified:    Walker Shadow

## 2019-10-04 NOTE — Progress Notes (Signed)
RT NOTE: RT called by RN as patient had self extubated. Upon arrival RT found patient on RA with sats of 100%. Patient in no distress with no increased WOB. Patient yelling and cursing at RN for not taking ETT out sooner. MD aware. Vitals are stable. RT will continue to monitor.

## 2019-10-04 NOTE — Progress Notes (Signed)
Did swallow screen on patient since he extubated this morning.  Patient tolerated well, lungs clear before and after test.  Will continue to monitor patient.

## 2019-10-05 DIAGNOSIS — N401 Enlarged prostate with lower urinary tract symptoms: Secondary | ICD-10-CM | POA: Diagnosis not present

## 2019-10-05 DIAGNOSIS — H548 Legal blindness, as defined in USA: Secondary | ICD-10-CM

## 2019-10-05 DIAGNOSIS — M7989 Other specified soft tissue disorders: Secondary | ICD-10-CM

## 2019-10-05 DIAGNOSIS — D62 Acute posthemorrhagic anemia: Secondary | ICD-10-CM | POA: Diagnosis not present

## 2019-10-05 DIAGNOSIS — R310 Gross hematuria: Secondary | ICD-10-CM | POA: Diagnosis not present

## 2019-10-05 DIAGNOSIS — R338 Other retention of urine: Secondary | ICD-10-CM

## 2019-10-05 LAB — GLUCOSE, CAPILLARY
Glucose-Capillary: 100 mg/dL — ABNORMAL HIGH (ref 70–99)
Glucose-Capillary: 101 mg/dL — ABNORMAL HIGH (ref 70–99)
Glucose-Capillary: 105 mg/dL — ABNORMAL HIGH (ref 70–99)
Glucose-Capillary: 109 mg/dL — ABNORMAL HIGH (ref 70–99)
Glucose-Capillary: 88 mg/dL (ref 70–99)
Glucose-Capillary: 97 mg/dL (ref 70–99)
Glucose-Capillary: 99 mg/dL (ref 70–99)

## 2019-10-05 LAB — BASIC METABOLIC PANEL
Anion gap: 9 (ref 5–15)
BUN: 22 mg/dL (ref 8–23)
CO2: 25 mmol/L (ref 22–32)
Calcium: 7.9 mg/dL — ABNORMAL LOW (ref 8.9–10.3)
Chloride: 106 mmol/L (ref 98–111)
Creatinine, Ser: 1.11 mg/dL (ref 0.61–1.24)
GFR calc Af Amer: >60 mL/min (ref >60)
GFR calc non Af Amer: >60 mL/min (ref >60)
Glucose, Bld: 105 mg/dL — ABNORMAL HIGH (ref 70–99)
Potassium: 3.8 mmol/L (ref 3.5–5.1)
Sodium: 140 mmol/L (ref 135–145)

## 2019-10-05 LAB — CBC
HCT: 26.1 % — ABNORMAL LOW (ref 39.0–52.0)
Hemoglobin: 8.2 g/dL — ABNORMAL LOW (ref 13.0–17.0)
MCH: 25.9 pg — ABNORMAL LOW (ref 26.0–34.0)
MCHC: 31.4 g/dL (ref 30.0–36.0)
MCV: 82.6 fL (ref 80.0–100.0)
Platelets: 184 10*3/uL (ref 150–400)
RBC: 3.16 MIL/uL — ABNORMAL LOW (ref 4.22–5.81)
RDW: 21.5 % — ABNORMAL HIGH (ref 11.5–15.5)
WBC: 13.7 10*3/uL — ABNORMAL HIGH (ref 4.0–10.5)
nRBC: 0.2 % (ref 0.0–0.2)

## 2019-10-05 LAB — MAGNESIUM: Magnesium: 1.9 mg/dL (ref 1.7–2.4)

## 2019-10-05 MED ORDER — ORAL CARE MOUTH RINSE
15.0000 mL | Freq: Two times a day (BID) | OROMUCOSAL | Status: DC
  Administered 2019-10-06 – 2019-10-08 (×6): 15 mL via OROMUCOSAL

## 2019-10-05 MED ORDER — OXYCODONE HCL 5 MG PO TABS
5.0000 mg | ORAL_TABLET | Freq: Four times a day (QID) | ORAL | Status: DC | PRN
  Administered 2019-10-05 – 2019-10-08 (×9): 5 mg via ORAL
  Filled 2019-10-05 (×10): qty 1

## 2019-10-05 MED ORDER — OXYBUTYNIN CHLORIDE 5 MG PO TABS
5.0000 mg | ORAL_TABLET | Freq: Three times a day (TID) | ORAL | Status: DC | PRN
  Administered 2019-10-05 – 2019-10-06 (×4): 5 mg via ORAL
  Filled 2019-10-05 (×4): qty 1

## 2019-10-05 MED ORDER — BELLADONNA ALKALOIDS-OPIUM 16.2-60 MG RE SUPP: 0.5000 | Freq: Four times a day (QID) | RECTAL | Status: DC | PRN

## 2019-10-05 NOTE — Progress Notes (Signed)
   10/05/19 0046  Vitals  ECG Heart Rate (!) 160  Cardiac Rhythm SVT (17 bts Nellie, Rn notified)  Level of Consciousness  Level of Consciousness Alert  Pain Assessment  Pain Scale 0-10  Pain Score 3  PCA/Epidural/Spinal Assessment  Respiratory Pattern Regular;Unlabored  MEWS Score  MEWS Temp 0  MEWS Systolic 0  MEWS Pulse 3  MEWS RR 0  MEWS LOC 0  MEWS Score 3  MEWS Score Color Yellow  Provider Notification  Provider Name/Title Hubbard Hartshorn  Date Provider Notified 10/05/19  Time Provider Notified 0112  Notification Type Call  Notification Reason Change in status  Response Other (Comment) (awaiting)  Note  Observations Pt is stable, resting well in bed.

## 2019-10-05 NOTE — Progress Notes (Signed)
Paged MD on call for more frequent SVT's. Pt is alert and oriented x 4. New orders received

## 2019-10-05 NOTE — Progress Notes (Signed)
PCCM Interval progress note:  Paged to see patient as he has had intermittent episodes of tachycardia up to 160 with concern for SVT.  On evaluation, pt in sinus rhythm with rate 90-100.  EKG also shows sinus tachycardia.  Pt is awake, alert, comfortable and denies palpitations, chest pressure or chest pain.   States when a clot passes he is experiencing significant pain.  Has tylenol, will add low dose prn oxycodone.  Labs are pending.   Otilio Carpen Lj Miyamoto, PA-C

## 2019-10-05 NOTE — Progress Notes (Signed)
Urology Progress Note   3 Days Post-Op from cystoscopy, exploratory laparotomy with cystotomy, bladder clot evacuation, open suprapubic tube placement, drain placement, urethral dilation in the setting of bulbar urethral stricture, gross hematuria and massively enlarged prostate.   Subjective: Overall feeling well this morning.  Again has many questions about his overall progression and long-term care.  Telemetry alert overnight, EKG obtained. Complained of bladder pain overnight which he attributed to clot.  He confirms that he cannot see clots in the tubing and nursing did not need to flush.  Suspect that these were actually bladder spasms given his surgery and catheter Afebrile, hemodynamically stable.  Urine continues to be light pink on slow drip CBI. JP output remains low 50 cc in the past 24 hours with only 25 overnight.  Does appear quite serous. Hemoglobin is fortunately stable at 8.2.  Objective: Vital signs in last 24 hours: Temp:  [98.3 F (36.8 C)-99.2 F (37.3 C)] 98.9 F (37.2 C) (05/02 0313) Pulse Rate:  [61-98] 98 (05/02 0313) Resp:  [13-21] 18 (05/02 0313) BP: (89-127)/(55-110) 113/71 (05/02 0313) SpO2:  [95 %-100 %] 100 % (05/02 0313)  Intake/Output from previous day: 05/01 0701 - 05/02 0700 In: 8709.8 [P.O.:540; I.V.:103.3; NG/GT:66.5] Out: 15025 PT:[id number]; Drains:50] Intake/Output this shift: No intake/output data recorded.  Physical Exam:  General: Alert and oriented CV: Regular rate Abdomen: Soft, appropriately tender. Incisions c/d/i withstaples. JP serous. GU: Foley in place draining clear light pink urine. SPT in place with CBI running on slow drip  Lab Results: Recent Labs    10/04/19 0347 10/04/19 1030 10/05/19 0252  HGB 8.3* 8.5* 8.2*  HCT 25.6* 27.2* 26.1*   Recent Labs    10/03/19 2200 10/05/19 0252  NA 136 140  K 4.1 3.8  CL 105 106  CO2 23 25  GLUCOSE 162* 105*  BUN 25* 22  CREATININE 1.27* 1.11  CALCIUM 7.9* 7.9*     Studies/Results: DG Abd Portable 1V  Result Date: 10/03/2019 CLINICAL DATA:  NG tube placement EXAM: PORTABLE ABDOMEN - 1 VIEW COMPARISON:  None. FINDINGS: Enteric tube terminates within the stomach. Included bowel gas pattern is unremarkable. IMPRESSION: Enteric tube within the stomach. Electronically Signed   By: Macy Mis M.D.   On: 10/03/2019 12:15   VAS [place] LOWER EXTREMITY VENOUS (DVT)  Result Date: 10/03/2019  Lower Venous DVTStudy Indications: Edema.  Comparison Study: No prior study Performing Technologist: Maudry Mayhew MHA, RDMS, RVT, RDCS  Examination Guidelines: A complete evaluation includes B-mode imaging, spectral Doppler, color Doppler, and power Doppler as needed of all accessible portions of each vessel. Bilateral testing is considered an integral part of a complete examination. Limited examinations for reoccurring indications may be performed as noted. The reflux portion of the exam is performed with the patient in reverse Trendelenburg.  +---------+---------------+---------+-----------+----------+--------------+ RIGHT    CompressibilityPhasicitySpontaneityPropertiesThrombus Aging +---------+---------------+---------+-----------+----------+--------------+ CFV      Full           Yes      Yes                                 +---------+---------------+---------+-----------+----------+--------------+ SFJ      Full                                                        +---------+---------------+---------+-----------+----------+--------------+  FV Prox  Full                                                        +---------+---------------+---------+-----------+----------+--------------+ FV Mid   Full                                                        +---------+---------------+---------+-----------+----------+--------------+ FV DistalFull                                                         +---------+---------------+---------+-----------+----------+--------------+ PFV      Full                                                        +---------+---------------+---------+-----------+----------+--------------+ POP      Full           Yes      Yes                                 +---------+---------------+---------+-----------+----------+--------------+ PTV      Full                    Yes                                 +---------+---------------+---------+-----------+----------+--------------+   Right Technical Findings: Not visualized segments include peroneal veins.  +---------+---------------+---------+-----------+----------+--------------+ LEFT     CompressibilityPhasicitySpontaneityPropertiesThrombus Aging +---------+---------------+---------+-----------+----------+--------------+ CFV      Full           Yes      Yes                                 +---------+---------------+---------+-----------+----------+--------------+ SFJ      Full                                                        +---------+---------------+---------+-----------+----------+--------------+ FV Prox  Full                                                        +---------+---------------+---------+-----------+----------+--------------+ FV Mid   Full                                                        +---------+---------------+---------+-----------+----------+--------------+  FV DistalFull                                                        +---------+---------------+---------+-----------+----------+--------------+ PFV      Full                                                        +---------+---------------+---------+-----------+----------+--------------+ POP      Full           Yes      Yes                                 +---------+---------------+---------+-----------+----------+--------------+ PTV      Full                    Yes                                  +---------+---------------+---------+-----------+----------+--------------+   Left Technical Findings: Not visualized segments include peroneal veins.   Summary: RIGHT: - There is no evidence of deep vein thrombosis in the lower extremity. However, portions of this examination were limited- see technologist comments above.  - No cystic structure found in the popliteal fossa.  LEFT: - There is no evidence of deep vein thrombosis in the lower extremity. However, portions of this examination were limited- see technologist comments above.  - No cystic structure found in the popliteal fossa.  *See table(s) above for measurements and observations. Electronically signed by Monica Martinez MD on 10/03/2019 at 2:02:39 PM.    Final     Assessment/Plan:  74 y.o. male s/p cystoscopy, exploratory laparotomy with cystotomy, bladder clot evacuation, open suprapubic tube placement, drain placement, urethral dilation in the setting of bulbar urethral stricture, gross hematuria and massively enlarged prostate.   Had many questions about his catheters and intraoperative findings.  These were all answered to his apparent satisfaction  Gross Hematuria Etiology of gross hematuria was an almost undoubtably from his massively enlarged prostate.  Open controlled cystotomy with intravesical clot removal and open suprapubic tube placement 10/03/2019. Appreciate interventional radiology assistance with prostatic artery embolization bilaterally.  -No longer actively bleeding. -Hemoglobin stable. -Wean CBI. If pale pink this afternoon, cap SPT. -Continue to follow JP drain clinically at this time.  BPH Patient known to have a massively enlarged prostate with multiple prior episodes of acute urinary retention.  Underwent prostatic artery embolization on October 03, 2019 in the setting of gross hematuria, clot urinary retention requiring transfusions.  -Creatinine improving, check in AM  -Continue  Foley catheter at this time, as above.   Bulbar Urethral Stricture Status post urethral dilation 10/03/2019.  -Continue Foley catheter at this time, as above.  Bladder Spasms Complained of bladder pain overnight 5/2. No clots in catheter tubing, no clots with bedside hand irrigation. Suspect that these were actually bladder spasms given his surgical procedures and catheters in place  -Ditropan 5mg  TID prn -B&O suppositories prn -Patient education -Ensure no clots in catheter tubing; RN can forward flush with 50cc  NS prn to ensure draining well.   Dispo: Appreciate ICU assistance.   LOS: 3 days

## 2019-10-05 NOTE — Progress Notes (Signed)
PROGRESS NOTE  [name] Ast. VOJ:[id number] DOB: May 20, 1946 DOA: 10/02/2019 PCP: [name], NP  HPI/Recap of past 24 hours: HPI from PCCM 74 year old with hx of massively enlarged prostate, BPH with recent placement of suprapubic catheter in early April after being unable to replace his chronic indwelling foley with ongoing hematuria and multiple visits for catheter obstruction. Pt presented again with gross hematuria and catheter obstruction with ABLA with Hgb 7.1 and AKI, taken to OR by urology with complex surgery requiring exploratory laparotomy with cystotomy, bladder clot evacuation, open suprapubic tube placement, urethral dilation in the setting of bulbar urethral stricture done on 10/03/2019. Large 1L clot evacuated with ongoing active bleeding requiring multiple blood products,  then transferred to IR for urgent bilateral prostatic artery embolization on 10/03/2019. Patient was then transferred to ICU on mechanical ventilation, PCCM consulted for further medical and ventilator management.  Patient was eventually stabilized, self extubated on 10/04/2019.  [facility] hospitalist assumed care on 10/05/2019.    Today, patient denies any new complaints, denies any chest pain, shortness of breath, cough, nausea/vomiting, fever/chills.  Patient still reports some intermittent bladder spasms as well as some post surgical pain.   Assessment/Plan: Principal Problem:   Anemia due to blood loss, acute Active Problems:   Hypertension   Hematuria   Acute blood loss anemia   Legally blind   AKI (acute kidney injury) (HCC)   BPH (benign prostatic hyperplasia)   Swelling of lower leg   Gross hematuria/BPH/bulbar urethral stricture/massive prostate/bladder spasms Status post exploratory laparotomy with cystotomy, bladder clot evacuation, open suprapubic tube placement, urethral dilation in the setting of bulbar urethral stricture, urgent IR bilateral prostatic artery embolization  10/03/2019 Large 1L clot evacuated with ongoing active bleeding requiring multiple blood products Management per urology  Acute blood loss anemia/postop anemia Presented with hemoglobin of 7.1, down from 10.6 about 10 days ago EBL during surgery about 2 L Hemoglobin stable status post multiple transfusions (about 6 units of PRBC and 2 units of FFP)  Leukocytosis Likely reactive, afebrile Monitor closely, daily CBC  Hypertension BP still soft Hold home metoprolol, hydrochlorothiazide,  Bilateral lower extremity edema Ultrasound Doppler negative for DVT Elevate legs       Malnutrition Type:  Nutrition Problem: Inadequate oral intake Etiology: inability to eat   Malnutrition Characteristics:  Signs/Symptoms: NPO status   Nutrition Interventions:  Interventions: Tube feeding    Estimated body mass index is 28.86 kg/m as calculated from the following:   Height as of this encounter: '5\' 8"'$  (1.727 m).   Weight as of this encounter: 86.1 kg.     Code Status: Full  Family Communication: Discussed with patient extensively  Disposition Plan: Status is: Inpatient  Remains inpatient appropriate because:Inpatient level of care appropriate due to severity of illness   Dispo: The patient is from: Home              Anticipated d/c is to: Home              Anticipated d/c date is: 1 day              Patient currently is not medically stable to d/c.  Still requiring CBI, urology signing off    Consultants:  Urology  PCCM  Procedures:  Extensive surgery as mentioned above  Antimicrobials:  None  DVT prophylaxis: SCDs   Objective: Vitals:   10/04/19 2018 10/05/19 0027 10/05/19 0313 10/05/19 1009  BP: 123/71 127/68 113/71 96/77  Pulse: 86 85  98 80  Resp: '18 18 18 19  '$ Temp: 99.2 F (37.3 C) 99 F (37.2 C) 98.9 F (37.2 C) 98.7 F (37.1 C)  TempSrc: Oral Oral Oral Oral  SpO2: 100% 100% 100% 100%  Weight:      Height:        Intake/Output Summary  (Last 24 hours) at 10/05/2019 1047 Last data filed at 10/05/2019 0900 Gross per 24 hour  Intake 8917 ml  Output 13525 ml  Net -4608 ml   Filed Weights   10/02/19 0328 10/04/19 0500  Weight: 86.2 kg 86.1 kg    Exam:  General: NAD, legally blind  Cardiovascular: S1, S2 present  Respiratory: CTAB  Abdomen: Soft, nontender, nondistended, bowel sounds present, Foley catheter draining pinkish urine, JP drain noted  Musculoskeletal: +1 bilateral pedal edema noted  Skin: Normal  Psychiatry: Normal mood   Data Reviewed: CBC: Recent Labs  Lab 10/02/19 0952 10/02/19 2335 10/03/19 0502 10/03/19 0558 10/03/19 1601 10/03/19 2200 10/04/19 0347 10/04/19 1030 10/05/19 0252  WBC 13.2*  --  8.3  --   --   --   --   --  13.7*  NEUTROABS 10.3*  --   --   --   --   --   --   --   --   HGB 7.1*   < > 9.2*   < > 9.0* 8.5* 8.3* 8.5* 8.2*  HCT 24.6*   < > 28.1*   < > 27.5* 26.3* 25.6* 27.2* 26.1*  MCV 70.5*  --  78.7*  --   --   --   --   --  82.6  PLT 361  --  161  160  --   --   --   --   --  184   < > = values in this interval not displayed.   Basic Metabolic Panel: Recent Labs  Lab 10/02/19 0952 10/02/19 1236 10/02/19 2335 10/03/19 0502 10/03/19 0558 10/03/19 0853 10/03/19 2200 10/05/19 0252 10/05/19 0649  NA 131*  --    < > 134* 134* 134* 136 140  --   K 4.2  --    < > 3.7 3.7 4.0 4.1 3.8  --   CL 97*  --   --  102  --   --  105 106  --   CO2 17*  --   --  22  --   --  23 25  --   GLUCOSE 139*  --   --  159*  --   --  162* 105*  --   BUN 24*  --   --  25*  --   --  25* 22  --   CREATININE 1.79*  --   --  1.34*  --   --  1.27* 1.11  --   CALCIUM 8.6*  --   --  8.4*  --   --  7.9* 7.9*  --   MG  --  1.9  --   --   --   --  1.9  --  1.9  PHOS  --  5.0*  --   --   --   --   --   --   --    < > = values in this interval not displayed.   GFR: Estimated Creatinine Clearance: 63.3 mL/min (by C-G formula based on SCr of 1.11 mg/dL). Liver Function Tests: Recent Labs  Lab  10/02/19 0953 10/03/19 0502  AST 46* 29  ALT 35 24  ALKPHOS 95 58  BILITOT 0.8 1.4*  PROT 6.5 4.7*  ALBUMIN 3.1* 2.2*   No results for input(s): LIPASE, AMYLASE in the last 168 hours. No results for input(s): AMMONIA in the last 168 hours. Coagulation Profile: Recent Labs  Lab 10/02/19 0953 10/03/19 0502  INR 1.1 1.2   Cardiac Enzymes: No results for input(s): CKTOTAL, CKMB, CKMBINDEX, TROPONINI in the last 168 hours. BNP (last 3 results) No results for input(s): PROBNP in the last 8760 hours. HbA1C: No results for input(s): HGBA1C in the last 72 hours. CBG: Recent Labs  Lab 10/04/19 1143 10/04/19 2021 10/05/19 0027 10/05/19 0432 10/05/19 0746  GLUCAP 116* 98 100* 101* 88   Lipid Profile: No results for input(s): CHOL, HDL, LDLCALC, TRIG, CHOLHDL, LDLDIRECT in the last 72 hours. Thyroid Function Tests: Recent Labs    10/02/19 1236  TSH 2.205   Anemia Panel: Recent Labs    10/02/19 1236  FERRITIN 21*  TIBC 477*  IRON 19*   Urine analysis:    Component Value Date/Time   COLORURINE YELLOW 08/23/2019 0818   APPEARANCEUR CLEAR 08/23/2019 0818   LABSPEC 1.010 08/23/2019 0818   PHURINE 6.0 08/23/2019 0818   GLUCOSEU NEGATIVE 08/23/2019 0818   HGBUR NEGATIVE 08/23/2019 0818   BILIRUBINUR NEGATIVE 08/23/2019 0818   KETONESUR NEGATIVE 08/23/2019 0818   PROTEINUR 30 (A) 08/23/2019 0818   UROBILINOGEN 0.2 12/27/2012 2241   NITRITE NEGATIVE 08/23/2019 0818   LEUKOCYTESUR LARGE (A) 08/23/2019 0818   Sepsis Labs: '@LABRCNTIP'$ (procalcitonin:4,lacticidven:4)  ) Recent Results (from the past 240 hour(s))  Respiratory Panel by RT PCR (Flu A&B, Covid) - Nasopharyngeal Swab     Status: None   Collection Time: 10/02/19 12:08 PM   Specimen: Nasopharyngeal Swab  Result Value Ref Range Status   SARS Coronavirus 2 by RT PCR NEGATIVE NEGATIVE Final    Comment: (NOTE) SARS-CoV-2 target nucleic acids are NOT DETECTED. The SARS-CoV-2 RNA is generally detectable in upper  respiratoy specimens during the acute phase of infection. The lowest concentration of SARS-CoV-2 viral copies this assay can detect is 131 copies/mL. A negative result does not preclude SARS-Cov-2 infection and should not be used as the sole basis for treatment or other patient management decisions. A negative result may occur with  improper specimen collection/handling, submission of specimen other than nasopharyngeal swab, presence of viral mutation(s) within the areas targeted by this assay, and inadequate number of viral copies (<131 copies/mL). A negative result must be combined with clinical observations, patient history, and epidemiological information. The expected result is Negative. Fact Sheet for Patients:  PinkCheek.be Fact Sheet for Healthcare Providers:  GravelBags.it This test is not yet ap proved or cleared by the [place] FDA and  has been authorized for detection and/or diagnosis of SARS-CoV-2 by FDA under an Emergency Use Authorization (EUA). This EUA will remain  in effect (meaning this test can be used) for the duration of the COVID-19 declaration under Section 564(b)(1) of the Act, 21 U.S.C. section 360bbb-3(b)(1), unless the authorization is terminated or revoked sooner.    Influenza A by PCR NEGATIVE NEGATIVE Final   Influenza B by PCR NEGATIVE NEGATIVE Final    Comment: (NOTE) The Xpert Xpress SARS-CoV-2/FLU/RSV assay is intended as an aid in  the diagnosis of influenza from Nasopharyngeal swab specimens and  should not be used as a sole basis for treatment. Nasal washings and  aspirates are unacceptable for Xpert Xpress SARS-CoV-2/FLU/RSV  testing. Fact Sheet for Patients: PinkCheek.be Fact Sheet for  Healthcare Providers: GravelBags.it This test is not yet approved or cleared by the [place] and  has been authorized for  detection and/or diagnosis of SARS-CoV-2 by  FDA under an Emergency Use Authorization (EUA). This EUA will remain  in effect (meaning this test can be used) for the duration of the  Covid-19 declaration under Section 564(b)(1) of the Act, 21  U.S.C. section 360bbb-3(b)(1), unless the authorization is  terminated or revoked. Performed at [facility], [place] [address] [place], [place] [address]       Studies: No results found.  Scheduled Meds: . sodium chloride   Intravenous Once  . vitamin C with rose hips  1,000 mg Oral Q lunch  . brimonidine  1 drop Both Eyes BID  . chlorhexidine gluconate (MEDLINE KIT)  15 mL Mouth Rinse BID  . Chlorhexidine Gluconate Cloth  6 each Topical Daily  . docusate  100 mg Oral BID  . dorzolamide-timolol  1 drop Both Eyes BID  . folic acid  1 mg Oral Q lunch  . mouth rinse  15 mL Mouth Rinse BID  . metoprolol tartrate  12.5 mg Oral BID  . pilocarpine  1 drop Right Eye Q6H  . polyethylene glycol  17 g Oral Daily  . pyridOXINE  50 mg Oral Q lunch  . vitamin B-12  1,000 mcg Oral Q lunch    Continuous Infusions: . sodium chloride       LOS: 3 days     Alma Friendly, MD [facility]  If 7PM-7AM, please contact night-coverage [email or web address] 10/05/2019, 10:47 AM

## 2019-10-06 ENCOUNTER — Inpatient Hospital Stay (HOSPITAL_COMMUNITY): Payer: Medicare Other

## 2019-10-06 ENCOUNTER — Encounter (HOSPITAL_COMMUNITY): Payer: Self-pay

## 2019-10-06 DIAGNOSIS — R310 Gross hematuria: Secondary | ICD-10-CM | POA: Diagnosis not present

## 2019-10-06 DIAGNOSIS — D62 Acute posthemorrhagic anemia: Secondary | ICD-10-CM | POA: Diagnosis not present

## 2019-10-06 DIAGNOSIS — N401 Enlarged prostate with lower urinary tract symptoms: Secondary | ICD-10-CM | POA: Diagnosis not present

## 2019-10-06 DIAGNOSIS — H548 Legal blindness, as defined in USA: Secondary | ICD-10-CM | POA: Diagnosis not present

## 2019-10-06 LAB — BPAM RBC
Blood Product Expiration Date: 202105152359
Blood Product Expiration Date: 202105252359
Blood Product Expiration Date: 202106052359
Blood Product Expiration Date: 202106062359
Blood Product Expiration Date: 202106072359
Blood Product Expiration Date: 202106072359
Blood Product Expiration Date: 202106072359
ISSUE DATE / TIME: 202104291456
ISSUE DATE / TIME: 202104291749
ISSUE DATE / TIME: 202104292340
ISSUE DATE / TIME: 202104300019
ISSUE DATE / TIME: 202104300019
ISSUE DATE / TIME: 202104300141
ISSUE DATE / TIME: 202104300141
Unit Type and Rh: 7300
Unit Type and Rh: 7300
Unit Type and Rh: 7300
Unit Type and Rh: 7300
Unit Type and Rh: 7300
Unit Type and Rh: 7300
Unit Type and Rh: 7300

## 2019-10-06 LAB — TYPE AND SCREEN
ABO/RH(D): B POS
Antibody Screen: NEGATIVE
Unit division: 0
Unit division: 0
Unit division: 0
Unit division: 0
Unit division: 0
Unit division: 0
Unit division: 0

## 2019-10-06 LAB — LACTIC ACID, PLASMA: Lactic Acid, Venous: 1.2 mmol/L (ref 0.5–1.9)

## 2019-10-06 LAB — URINALYSIS, ROUTINE W REFLEX MICROSCOPIC
Bilirubin Urine: NEGATIVE
Glucose, UA: NEGATIVE mg/dL
Ketones, ur: NEGATIVE mg/dL
Nitrite: NEGATIVE
Protein, ur: 30 mg/dL — AB
Specific Gravity, Urine: 1.005 (ref 1.005–1.030)
pH: 5 (ref 5.0–8.0)

## 2019-10-06 LAB — CBC
HCT: 26.6 % — ABNORMAL LOW (ref 39.0–52.0)
Hemoglobin: 8.1 g/dL — ABNORMAL LOW (ref 13.0–17.0)
MCH: 25.3 pg — ABNORMAL LOW (ref 26.0–34.0)
MCHC: 30.5 g/dL (ref 30.0–36.0)
MCV: 83.1 fL (ref 80.0–100.0)
Platelets: 221 10*3/uL (ref 150–400)
RBC: 3.2 MIL/uL — ABNORMAL LOW (ref 4.22–5.81)
RDW: 21.5 % — ABNORMAL HIGH (ref 11.5–15.5)
WBC: 19.8 10*3/uL — ABNORMAL HIGH (ref 4.0–10.5)
nRBC: 0 % (ref 0.0–0.2)

## 2019-10-06 LAB — BASIC METABOLIC PANEL
Anion gap: 6 (ref 5–15)
BUN: 16 mg/dL (ref 8–23)
CO2: 28 mmol/L (ref 22–32)
Calcium: 7.7 mg/dL — ABNORMAL LOW (ref 8.9–10.3)
Chloride: 105 mmol/L (ref 98–111)
Creatinine, Ser: 1.09 mg/dL (ref 0.61–1.24)
GFR calc Af Amer: >60 mL/min (ref >60)
GFR calc non Af Amer: >60 mL/min (ref >60)
Glucose, Bld: 111 mg/dL — ABNORMAL HIGH (ref 70–99)
Potassium: 4.3 mmol/L (ref 3.5–5.1)
Sodium: 139 mmol/L (ref 135–145)

## 2019-10-06 LAB — MAGNESIUM: Magnesium: 1.8 mg/dL (ref 1.7–2.4)

## 2019-10-06 LAB — GLUCOSE, CAPILLARY
Glucose-Capillary: 94 mg/dL (ref 70–99)
Glucose-Capillary: 114 mg/dL — ABNORMAL HIGH (ref 70–99)
Glucose-Capillary: 87 mg/dL (ref 70–99)
Glucose-Capillary: 104 mg/dL — ABNORMAL HIGH (ref 70–99)
Glucose-Capillary: 157 mg/dL — ABNORMAL HIGH (ref 70–99)

## 2019-10-06 LAB — PROCALCITONIN: Procalcitonin: 0.42 ng/mL

## 2019-10-06 MED ORDER — SODIUM CHLORIDE 0.9 % IV SOLN
2.0000 g | Freq: Three times a day (TID) | INTRAVENOUS | Status: DC
  Administered 2019-10-06 – 2019-10-07 (×4): 2 g via INTRAVENOUS
  Filled 2019-10-06 (×6): qty 2

## 2019-10-06 MED ORDER — METOPROLOL TARTRATE 5 MG/5ML IV SOLN: 2.5000 mg | Freq: Three times a day (TID) | INTRAVENOUS | Status: DC | PRN

## 2019-10-06 MED ORDER — SODIUM CHLORIDE 0.9 % IV BOLUS
500.0000 mL | Freq: Once | INTRAVENOUS | Status: AC
  Administered 2019-10-06: 500 mL via INTRAVENOUS

## 2019-10-06 MED ORDER — VANCOMYCIN HCL 1750 MG/350ML IV SOLN
1750.0000 mg | INTRAVENOUS | Status: DC
  Administered 2019-10-06: 1750 mg via INTRAVENOUS
  Filled 2019-10-06: qty 350

## 2019-10-06 NOTE — Progress Notes (Signed)
Urology Inpatient Progress Report  Acute blood loss anemia [D62] Gross hematuria [R31.0] Anemia, unspecified type [D64.9]  Procedure(s): CYSTOSCOPY WITH DIRECT VISION INTERNAL URETHROTOMY WITH CLOT EVACUATION retrograd urethralgram Coverted to Open. Open Cystostomy Suprapubic tube placement and JP drain and urethral dilation  4 Days Post-Op   Intv/Subj: No acute events overnight. Patient is without complaint. Urine draining clear yellow off CBI.  He is ambulating with the assistance of physical therapy.  Principal Problem:   Anemia due to blood loss, acute Active Problems:   Hypertension   Hematuria   Acute blood loss anemia   Legally blind   AKI (acute kidney injury) (HCC)   BPH (benign prostatic hyperplasia)   Swelling of lower leg  Current Facility-Administered Medications  Medication Dose Route Frequency Provider Last Rate Last Admin  . 0.9 %  sodium chloride infusion (Manually program via Guardrails IV Fluids)   Intravenous Once Spero Geralds, MD      . 0.9 %  sodium chloride infusion  10 mL/hr Intravenous Once Spero Geralds, MD      . acetaminophen (TYLENOL) tablet 650 mg  650 mg Oral Q6H PRN Spero Geralds, MD   650 mg at 10/05/19 0458   Or  . acetaminophen (TYLENOL) suppository 650 mg  650 mg Rectal Q6H PRN Spero Geralds, MD      . ascorbic acid (VITAMIN C) tablet 1,000 mg  1,000 mg Oral Q lunch Spero Geralds, MD   1,000 mg at 10/04/19 1223  . brimonidine (ALPHAGAN) 0.2 % ophthalmic solution 1 drop  1 drop Both Eyes BID Spero Geralds, MD   1 drop at 10/04/19 1046  . chlorhexidine gluconate (MEDLINE KIT) (PERIDEX) 0.12 % solution 15 mL  15 mL Mouth Rinse BID Spero Geralds, MD   15 mL at 10/05/19 0942  . Chlorhexidine Gluconate Cloth 2 % PADS 6 each  6 each Topical Daily Spero Geralds, MD   6 each at 10/05/19 [id number]  . docusate (COLACE) 50 MG/5ML liquid 100 mg  100 mg Oral BID Spero Geralds, MD   100 mg at 10/05/19 0941  . dorzolamide-timolol (COSOPT)  22.3-6.8 MG/ML ophthalmic solution 1 drop  1 drop Both Eyes BID Spero Geralds, MD   1 drop at 10/04/19 2136  . folic acid (FOLVITE) tablet 1 mg  1 mg Oral Q lunch Spero Geralds, MD   1 mg at 10/04/19 1224  . magnesium oxide (MAG-OX) tablet 200 mg  200 mg Oral Daily PRN Spero Geralds, MD      . MEDLINE mouth rinse  15 mL Mouth Rinse BID Spero Geralds, MD   15 mL at 10/06/19 0050  . metoprolol tartrate (LOPRESSOR) tablet 12.5 mg  12.5 mg Oral BID Spero Geralds, MD   12.5 mg at 10/05/19 2100  . ondansetron (ZOFRAN) tablet 4 mg  4 mg Oral Q6H PRN Spero Geralds, MD       Or  . ondansetron [facility]) injection 4 mg  4 mg Intravenous Q6H PRN Spero Geralds, MD      . opium-belladonna (B&O) suppository 16.2-'60mg'$   0.5 suppository Rectal Q6H PRN Alma Friendly, MD      . oxybutynin (DITROPAN) tablet 5 mg  5 mg Oral Q8H PRN Alma Friendly, MD   5 mg at 10/06/19 0115  . oxyCODONE (Oxy IR/ROXICODONE) immediate release tablet 5 mg  5 mg Oral Q6H PRN Gleason, Otilio Carpen, PA-C   5 mg at  10/06/19 0210  . pilocarpine (PILOCAR) 2 % ophthalmic solution 1 drop  1 drop Right Eye Q6H Spero Geralds, MD   1 drop at 10/04/19 1806  . polyethylene glycol (MIRALAX / GLYCOLAX) packet 17 g  17 g Oral Daily Spero Geralds, MD   17 g at 10/05/19 0941  . pyridOXINE (VITAMIN B-6) tablet 50 mg  50 mg Oral Q lunch Spero Geralds, MD   50 mg at 10/04/19 1223  . vitamin B-12 (CYANOCOBALAMIN) tablet 1,000 mcg  1,000 mcg Oral Q lunch Spero Geralds, MD   1,000 mcg at 10/04/19 1224     Objective: Vital: Vitals:   10/05/19 1739 10/05/19 2037 10/06/19 0210 10/06/19 0529  BP: 107/69 117/78 114/73 114/77  Pulse: (!) 106 99 94 94  Resp: '16  16 18  '$ Temp: 100 F (37.8 C) (!) 100.6 F (38.1 C) (!) 100.6 F (38.1 C) 100.1 F (37.8 C)  TempSrc: Axillary Oral Oral Oral  SpO2: 100% 98% 100% 97%  Weight:      Height:       I/Os: I/O last 3 completed shifts: In: 11397 [P.O.:897; Other:10500] Out: [id number]  [MCEYE:23361; Drains:90]  Physical Exam:  General: Patient is in no apparent distress Lungs: Normal respiratory effort, chest expands symmetrically. GI: Incisions are c/d/i.  The abdomen is soft and nontender without mass. JP drain with minimal serosanguinous drainage Foley: Draining clear yellow urine off CBI Ext: lower extremities symmetric  Lab Results: Recent Labs    10/04/19 1030 10/05/19 0252 10/06/19 0232  WBC  --  13.7* 19.8*  HGB 8.5* 8.2* 8.1*  HCT 27.2* 26.1* 26.6*   Recent Labs    10/03/19 2200 10/05/19 0252 10/06/19 0232  NA 136 140 139  K 4.1 3.8 4.3  CL 105 106 105  CO2 '23 25 28  '$ GLUCOSE 162* 105* 111*  BUN 25* 22 16  CREATININE 1.27* 1.11 1.09  CALCIUM 7.9* 7.9* 7.7*   No results for input(s): LABPT, INR in the last 72 hours. No results for input(s): LABURIN in the last 72 hours. Results for orders placed or performed during the hospital encounter of 10/02/19  Respiratory Panel by RT PCR (Flu A&B, Covid) - Nasopharyngeal Swab     Status: None   Collection Time: 10/02/19 12:08 PM   Specimen: Nasopharyngeal Swab  Result Value Ref Range Status   SARS Coronavirus 2 by RT PCR NEGATIVE NEGATIVE Final    Comment: (NOTE) SARS-CoV-2 target nucleic acids are NOT DETECTED. The SARS-CoV-2 RNA is generally detectable in upper respiratoy specimens during the acute phase of infection. The lowest concentration of SARS-CoV-2 viral copies this assay can detect is 131 copies/mL. A negative result does not preclude SARS-Cov-2 infection and should not be used as the sole basis for treatment or other patient management decisions. A negative result may occur with  improper specimen collection/handling, submission of specimen other than nasopharyngeal swab, presence of viral mutation(s) within the areas targeted by this assay, and inadequate number of viral copies (<131 copies/mL). A negative result must be combined with clinical observations, patient history, and  epidemiological information. The expected result is Negative. Fact Sheet for Patients:  PinkCheek.be Fact Sheet for Healthcare Providers:  GravelBags.it This test is not yet ap proved or cleared by the [place] FDA and  has been authorized for detection and/or diagnosis of SARS-CoV-2 by FDA under an Emergency Use Authorization (EUA). This EUA will remain  in effect (meaning this test can be used) for the duration  of the COVID-19 declaration under Section 564(b)(1) of the Act, 21 U.S.C. section 360bbb-3(b)(1), unless the authorization is terminated or revoked sooner.    Influenza A by PCR NEGATIVE NEGATIVE Final   Influenza B by PCR NEGATIVE NEGATIVE Final    Comment: (NOTE) The Xpert Xpress SARS-CoV-2/FLU/RSV assay is intended as an aid in  the diagnosis of influenza from Nasopharyngeal swab specimens and  should not be used as a sole basis for treatment. Nasal washings and  aspirates are unacceptable for Xpert Xpress SARS-CoV-2/FLU/RSV  testing. Fact Sheet for Patients: PinkCheek.be Fact Sheet for Healthcare Providers: GravelBags.it This test is not yet approved or cleared by the [place] FDA and  has been authorized for detection and/or diagnosis of SARS-CoV-2 by  FDA under an Emergency Use Authorization (EUA). This EUA will remain  in effect (meaning this test can be used) for the duration of the  Covid-19 declaration under Section 564(b)(1) of the Act, 21  U.S.C. section 360bbb-3(b)(1), unless the authorization is  terminated or revoked. Performed at [facility], [place] [address] [place], [place] [address]     Studies/Results: No results found.  Assessment: Gross hematuria secondary to massive BPH  Procedure(s): CYSTOSCOPY WITH DIRECT VISION INTERNAL URETHROTOMY WITH CLOT EVACUATION retrograd urethralgram Coverted to Open. Open Cystostomy  Suprapubic tube placement and JP drain and urethral dilation, 4 Days Post-Op  doing well.  Plan: Cap Suprapubic tube Maintain urethral Foley catheter to drainage Okay for discharge from urological standpoint.  I will set him up for follow-up in about 1 week with a fluoroscopic cystogram prior to Foley removal.    Link Snuffer, MD Urology 10/06/2019, 10:30 AM

## 2019-10-06 NOTE — Progress Notes (Signed)
Nutrition Follow-up  RD working remotely.  DOCUMENTATION CODES:   Not applicable  INTERVENTION:   -Magic cup BID with meals, each supplement provides 290 kcal and 9 grams of protein -MVI with minerals daily  NUTRITION DIAGNOSIS:   Inadequate oral intake related to inability to eat as evidenced by NPO status  Progressing; advanced to a heart healthy diet on 10/04/19  GOAL:   Patient will meet greater than or equal to 90% of their needs  Progressing  MONITOR:   PO intake, Supplement acceptance, Labs, Weight trends, Skin, I & O's  REASON FOR ASSESSMENT:   Ventilator, Consult Enteral/tube feeding initiation and management  ASSESSMENT:   74 yo male admitted with hematuria, ABLA. S/P ex lap with extraction of > 1 L blood clot from the bladder, then required bilateral prostatic artery embolization. PMH includes chronic indwelling foley since 2014 with recent placement of suprapubic catheter, BPH, HTN, vision impairment.  5/1- extubated, passed bedside swallow screen; transferred from ICU to floor  Reviewed I/O's: -11.3 L x 24 hours and -27.9 L since admission  UOP: 18 L x 24 hours  Drain output: 65 ml x 24 hours  Attempted to speak with pt via phone, however, no answer.   Pt with good appetite, tolerating current consistency well. Noted meal completion 100%.   Medications reviewed and include folic acid, vitamin 0000000, vitamin B-6, and miralax.   Labs reviewed. CBGS: 87.  Diet Order:   Diet Order            Diet Heart Room service appropriate? Yes; Fluid consistency: Thin  Diet effective now              EDUCATION NEEDS:   No education needs have been identified at this time  Skin:  Skin Assessment: Skin Integrity Issues: Skin Integrity Issues:: Incisions Incisions: perineum  Last BM:  Unknown  Height:   Ht Readings from Last 1 Encounters:  10/02/19 5\' 8"  (1.727 m)    Weight:   Wt Readings from Last 1 Encounters:  10/04/19 86.1 kg    Ideal  Body Weight:  70 kg  BMI:  Body mass index is 28.86 kg/m.  Estimated Nutritional Needs:   Kcal:  1950-2150  Protein:  95-100 grams  Fluid:  > 1.9 L    Loistine Chance, RD, LDN, [place] Registered Dietitian II Certified Diabetes Care and Education Specialist Please refer to [facility] for RD and/or RD on-call/weekend/after hours pager

## 2019-10-06 NOTE — Evaluation (Signed)
Occupational Therapy Evaluation Patient Details Name: [name] [name]. MRN: [id number] DOB: 1946/03/20 Today's Date: 10/06/2019    History of Present Illness 74 y.o. male presenting with gross hematuria and catheter obstruction. 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 Large clot evacuated with ongoing bleeding requiring multiple blood products, pt transferred to IR for urgent bilateral prostatic artery embolization on 10/03/2019. Pt transferred to ICU on mechanical ventilation, self-extubated 10/04/2019. PMH legally blind, HTN, and AKI.     Clinical Impression   PTA, pt was living at home alone, pt reports he was independent with ADL/IADL and functional mobility. Pt currently requires modA for bed moblity and minA for functional mobility at RW level. He is limited in mobility due to pain in abdomen, pt reporting pain 10/10 but agreeable to OT session and faces pain level 2/10. Due to decline in current level of function, pt would benefit from acute OT to address established goals to facilitate safe D/C to venue listed below. At this time, recommend SNF follow-up. Will continue to follow acutely.     Follow Up Recommendations  SNF;Supervision/Assistance - 24 hour    Equipment Recommendations  None recommended by OT    Recommendations for Other Services       Precautions / Restrictions Precautions Precautions: Fall Precaution Comments: JP drain, subrapubic tube  Restrictions Weight Bearing Restrictions: No      Mobility Bed Mobility Overal bed mobility: Needs Assistance Bed Mobility: Supine to Sit;Sit to Supine     Supine to sit: Mod assist Sit to supine: Mod assist   General bed mobility comments: modA for trunk and BLE management  Transfers Overall transfer level: Needs assistance Equipment used: Rolling  walker (2 wheeled) Transfers: Sit to/from Stand Sit to Stand: Min assist         General transfer comment: minA for powerup into standing, cues for hand placement    Balance Overall balance assessment: Needs assistance Sitting-balance support: No upper extremity supported;Feet supported Sitting balance-Leahy Scale: Fair     Standing balance support: No upper extremity supported;During functional activity Standing balance-Leahy Scale: Fair Standing balance comment: able to static stand without UE support, single UE support for stability during dynamic balance and BUE support during mobility                           ADL either performed or assessed with clinical judgement   ADL Overall ADL's : Needs assistance/impaired Eating/Feeding: Set up;Sitting   Grooming: Minimal assistance;Standing   Upper Body Bathing: Set up;Sitting   Lower Body Bathing: Minimal assistance;Sit to/from stand   Upper Body Dressing : Set up;Sitting   Lower Body Dressing: Minimal assistance;Sit to/from stand   Toilet Transfer: Minimal assistance;Ambulation;RW Toilet Transfer Details (indicate cue type and reason): cues for visual guidance Toileting- Clothing Manipulation and Hygiene: Minimal assistance;Sit to/from stand       Functional mobility during ADLs: Minimal assistance;Rolling walker General ADL Comments: pt limited by pain, requiring minA for cues for navigating room due to visual impairment;     Vision Baseline Vision/History: Legally blind       Perception     Praxis      Pertinent Vitals/Pain Pain Assessment: 0-10 Pain Score: 10-Worst pain ever Pain Location: abdomen - pt reports feeling a clot at the moment, therapist did not see a clot Pain Descriptors / Indicators: Sore  Pain Intervention(s): Limited activity within patient's tolerance;Monitored during session;Patient requesting pain meds-RN notified     Hand Dominance Right   Extremity/Trunk Assessment  Upper Extremity Assessment Upper Extremity Assessment: Generalized weakness   Lower Extremity Assessment Lower Extremity Assessment: Defer to PT evaluation   Cervical / Trunk Assessment Cervical / Trunk Assessment: Kyphotic   Communication Communication Communication: No difficulties   Cognition Arousal/Alertness: Awake/alert Behavior During Therapy: WFL for tasks assessed/performed Overall Cognitive Status: Within Functional Limits for tasks assessed                                     General Comments  vss    Exercises     Shoulder Instructions      Home Living Family/patient expects to be discharged to:: Private residence Living Arrangements: Alone Available Help at Discharge: Family;Available PRN/intermittently Type of Home: Apartment Home Access: Stairs to enter Entrance Stairs-Number of Steps: flight   Home Layout: One level     Bathroom Shower/Tub: Teacher, early years/pre: Standard Bathroom Accessibility: Yes How Accessible: Accessible via walker Home Equipment: Cane - single point          Prior Functioning/Environment Level of Independence: Independent        Comments: pt reports he was using services for transportation but since COVID it has been difficult.         OT Problem List: Decreased activity tolerance;Impaired balance (sitting and/or standing);Decreased safety awareness;Decreased knowledge of use of DME or AE;Decreased knowledge of precautions;Pain      OT Treatment/Interventions: Therapeutic exercise;Self-care/ADL training;DME and/or AE instruction;Energy conservation;Therapeutic activities;Balance training;Patient/family education    OT Goals(Current goals can be found in the care plan section) Acute Rehab OT Goals Patient Stated Goal: to get stronger, walk more and go home OT Goal Formulation: With patient Time For Goal Achievement: 10/20/19 Potential to Achieve Goals: Good ADL Goals Pt Will Perform  Grooming: with modified independence;standing Pt Will Perform Lower Body Dressing: with modified independence;sit to/from stand Pt Will Transfer to Toilet: with modified independence;ambulating Pt Will Perform Tub/Shower Transfer: with modified independence;Tub transfer Additional ADL Goal #1: Pt will progress to EOB independently in preparation for ADL.  OT Frequency: Min 2X/week   Barriers to D/C: Decreased caregiver support  pt lives alone       Co-evaluation              AM-PAC OT "6 Clicks" Daily Activity     Outcome Measure Help from another person eating meals?: A Little Help from another person taking care of personal grooming?: A Little Help from another person toileting, which includes using toliet, bedpan, or urinal?: A Little Help from another person bathing (including washing, rinsing, drying)?: A Little Help from another person to put on and taking off regular upper body clothing?: A Little Help from another person to put on and taking off regular lower body clothing?: A Little 6 Click Score: 18   End of Session Equipment Utilized During Treatment: Gait belt;Rolling walker Nurse Communication: Mobility status;Patient requests pain meds  Activity Tolerance: Patient tolerated treatment well;Patient limited by pain Patient left: in bed;with bed alarm set;with call bell/phone within reach  OT Visit Diagnosis: Other abnormalities of gait and mobility (R26.89);Pain Pain - part of body: (abdomen)                Time: [id number] OT Time Calculation (min): 46 min Charges:  OT General Charges $  OT Visit: 1 Visit OT Evaluation $OT Eval Moderate Complexity: 1 Mod OT Treatments $Self Care/Home Management : 23-37 mins  Helene Kelp OTR/L Acute Rehabilitation Services Office: [place] 10/06/2019, 10:47 AM

## 2019-10-06 NOTE — Progress Notes (Signed)
PROGRESS NOTE  Aldean Ast. UEA:[id number] DOB: 07-Oct-1945 DOA: 10/02/2019 PCP: [name], NP  HPI/Recap of past 24 hours: HPI from PCCM 74 year old with hx of massively enlarged prostate, BPH with recent placement of suprapubic catheter in early April after being unable to replace his chronic indwelling foley with ongoing hematuria and multiple visits for catheter obstruction. Pt presented again with gross hematuria and catheter obstruction with ABLA with Hgb 7.1 and AKI, taken to OR by urology with complex surgery requiring exploratory laparotomy with cystotomy, bladder clot evacuation, open suprapubic tube placement, urethral dilation in the setting of bulbar urethral stricture done on 10/03/2019. Large 1L clot evacuated with ongoing active bleeding requiring multiple blood products,  then transferred to IR for urgent bilateral prostatic artery embolization on 10/03/2019. Patient was then transferred to ICU on mechanical ventilation, PCCM consulted for further medical and ventilator management.  Patient was eventually stabilized, self extubated on 10/04/2019.  [facility] hospital[facility] assumed care on 10/05/2019.     Today, patient noted to be spiking temp, T-max 102.1, patient just reports postop suprapubic pain, denies any cough, nausea/vomiting, diarrhea.  Discussed with patient the importance of getting a chest x-ray to rule out any pneumonia given his intubation, reported he is fine and does not want to be exposed to much radiation.  Will place another order for chest x-ray.   Assessment/Plan: Principal Problem:   Anemia due to blood loss, acute Active Problems:   Hypertension   Hematuria   Acute blood loss anemia   Legally blind   AKI (acute kidney injury) (HCC)   BPH (benign prostatic hyperplasia)   Swelling of lower leg   Gross hematuria/BPH/bulbar urethral stricture/massive prostate/bladder spasms Status post exploratory laparotomy with cystotomy, bladder clot evacuation,  open suprapubic tube placement, urethral dilation in the setting of bulbar urethral stricture, urgent IR bilateral prostatic artery embolization 10/03/2019 Large 1L clot evacuated with ongoing active bleeding requiring multiple blood products Management per urology  Acute blood loss anemia/postop anemia Presented with hemoglobin of 7.1, down from 10.6 about 10 days ago EBL during surgery about 2 L Hemoglobin stable status post multiple transfusions (about 6 units of PRBC and 2 units of FFP)  ??Sepsis Post op Currently febrile, with rising leukocytosis, tachycardic Sepsis work-up in progress: UA showed large hemoglobin, moderate leukocytes, rare bacteria, WBC 6-10, UC pending, BC x2 pending, chest x-ray pending Lactic acid pending Procalcitonin elevated, will trend Start patient empirically on vancomycin, cefepime We will give patient 500 cc bolus of IV fluids on 10/06/2019, will reassess for maintenance fluid, pending lactic acid and BP due to significant bilateral lower extremity edema Telemetry Monitor closely  Hypertension BP still soft Hold home metoprolol, hydrochlorothiazide  Bilateral lower extremity edema Ultrasound Doppler negative for DVT Elevate legs       Malnutrition Type:  Nutrition Problem: Inadequate oral intake Etiology: inability to eat   Malnutrition Characteristics:  Signs/Symptoms: NPO status   Nutrition Interventions:  Interventions: Magic cup, MVI    Estimated body mass index is 28.86 kg/m as calculated from the following:   Height as of this encounter: '5\' 8"'$  (1.727 m).   Weight as of this encounter: 86.1 kg.     Code Status: Full  Family Communication: Discussed with patient extensively  Disposition Plan: Status is: Inpatient  Remains inpatient appropriate because:Inpatient level of care appropriate due to severity of illness   Dispo: The patient is from: Home  Anticipated d/c is to: SNF              Anticipated d/c  date is: 2 days              Patient currently is not medically stable to d/c.  Currently possibly septic    Consultants:  Urology  PCCM  Procedures:  Extensive surgery as mentioned above  Antimicrobials:  Cefepime  Vancomycin  DVT prophylaxis: SCDs   Objective: Vitals:   10/05/19 2037 10/06/19 0210 10/06/19 0529 10/06/19 1500  BP: 117/78 114/73 114/77 118/77  Pulse: 99 94 94 (!) 139  Resp:  '16 18 18  '$ Temp: (!) 100.6 F (38.1 C) (!) 100.6 F (38.1 C) 100.1 F (37.8 C) (!) 102.1 F (38.9 C)  TempSrc: Oral Oral Oral Oral  SpO2: 98% 100% 97% 98%  Weight:      Height:        Intake/Output Summary (Last 24 hours) at 10/06/2019 1531 Last data filed at 10/06/2019 1300 Gross per 24 hour  Intake 3480 ml  Output 15865 ml  Net -12385 ml   Filed Weights   10/02/19 0328 10/04/19 0500  Weight: 86.2 kg 86.1 kg    Exam:  General: NAD, legally blind  Cardiovascular: S1, S2 present  Respiratory: CTAB  Abdomen: Soft, nontender, nondistended, bowel sounds present, Foley catheter draining yellow urine, JP drain noted  Musculoskeletal: +1 bilateral pedal edema noted  Skin: Normal  Psychiatry: Normal mood   Data Reviewed: CBC: Recent Labs  Lab 10/02/19 0952 10/02/19 2335 10/03/19 0502 10/03/19 0558 10/03/19 2200 10/04/19 0347 10/04/19 1030 10/05/19 0252 10/06/19 0232  WBC 13.2*  --  8.3  --   --   --   --  13.7* 19.8*  NEUTROABS 10.3*  --   --   --   --   --   --   --   --   HGB 7.1*   < > 9.2*   < > 8.5* 8.3* 8.5* 8.2* 8.1*  HCT 24.6*   < > 28.1*   < > 26.3* 25.6* 27.2* 26.1* 26.6*  MCV 70.5*  --  78.7*  --   --   --   --  82.6 83.1  PLT 361  --  161  160  --   --   --   --  184 221   < > = values in this interval not displayed.   Basic Metabolic Panel: Recent Labs  Lab 10/02/19 0952 10/02/19 1236 10/02/19 2335 10/03/19 0502 10/03/19 0502 10/03/19 3570 10/03/19 1779 10/03/19 2200 10/05/19 0252 10/05/19 0649 10/06/19 0232  NA 131*  --     < > 134*   < > 134* 134* 136 140  --  139  K 4.2  --    < > 3.7   < > 3.7 4.0 4.1 3.8  --  4.3  CL 97*  --   --  102  --   --   --  105 106  --  105  CO2 17*  --   --  22  --   --   --  23 25  --  28  GLUCOSE 139*  --   --  159*  --   --   --  162* 105*  --  111*  BUN 24*  --   --  25*  --   --   --  25* 22  --  16  CREATININE 1.79*  --   --  1.34*  --   --   --  1.27* 1.11  --  1.09  CALCIUM 8.6*  --   --  8.4*  --   --   --  7.9* 7.9*  --  7.7*  MG  --  1.9  --   --   --   --   --  1.9  --  1.9  --   PHOS  --  5.0*  --   --   --   --   --   --   --   --   --    < > = values in this interval not displayed.   GFR: Estimated Creatinine Clearance: 64.5 mL/min (by C-G formula based on SCr of 1.09 mg/dL). Liver Function Tests: Recent Labs  Lab 10/02/19 0953 10/03/19 0502  AST 46* 29  ALT 35 24  ALKPHOS 95 58  BILITOT 0.8 1.4*  PROT 6.5 4.7*  ALBUMIN 3.1* 2.2*   No results for input(s): LIPASE, AMYLASE in the last 168 hours. No results for input(s): AMMONIA in the last 168 hours. Coagulation Profile: Recent Labs  Lab 10/02/19 0953 10/03/19 0502  INR 1.1 1.2   Cardiac Enzymes: No results for input(s): CKTOTAL, CKMB, CKMBINDEX, TROPONINI in the last 168 hours. BNP (last 3 results) No results for input(s): PROBNP in the last 8760 hours. HbA1C: No results for input(s): HGBA1C in the last 72 hours. CBG: Recent Labs  Lab 10/05/19 2034 10/05/19 2327 10/06/19 0354 10/06/19 0731 10/06/19 1139  GLUCAP 109* 105* 94 114* 87   Lipid Profile: No results for input(s): CHOL, HDL, LDLCALC, TRIG, CHOLHDL, LDLDIRECT in the last 72 hours. Thyroid Function Tests: No results for input(s): TSH, T4TOTAL, FREET4, T3FREE, THYROIDAB in the last 72 hours. Anemia Panel: No results for input(s): VITAMINB12, FOLATE, FERRITIN, TIBC, IRON, RETICCTPCT in the last 72 hours. Urine analysis:    Component Value Date/Time   COLORURINE STRAW (A) 10/06/2019 0859   APPEARANCEUR CLEAR 10/06/2019 0859    LABSPEC 1.005 10/06/2019 0859   PHURINE 5.0 10/06/2019 0859   GLUCOSEU NEGATIVE 10/06/2019 0859   HGBUR LARGE (A) 10/06/2019 0859   BILIRUBINUR NEGATIVE 10/06/2019 0859   KETONESUR NEGATIVE 10/06/2019 0859   PROTEINUR 30 (A) 10/06/2019 0859   UROBILINOGEN 0.2 12/27/2012 2241   NITRITE NEGATIVE 10/06/2019 0859   LEUKOCYTESUR MODERATE (A) 10/06/2019 0859   Sepsis Labs: '@LABRCNTIP'$ (procalcitonin:4,lacticidven:4)  ) Recent Results (from the past 240 hour(s))  Respiratory Panel by RT PCR (Flu A&B, Covid) - Nasopharyngeal Swab     Status: None   Collection Time: 10/02/19 12:08 PM   Specimen: Nasopharyngeal Swab  Result Value Ref Range Status   SARS Coronavirus 2 by RT PCR NEGATIVE NEGATIVE Final    Comment: (NOTE) SARS-CoV-2 target nucleic acids are NOT DETECTED. The SARS-CoV-2 RNA is generally detectable in upper respiratoy specimens during the acute phase of infection. The lowest concentration of SARS-CoV-2 viral copies this assay can detect is 131 copies/mL. A negative result does not preclude SARS-Cov-2 infection and should not be used as the sole basis for treatment or other patient management decisions. A negative result may occur with  improper specimen collection/handling, submission of specimen other than nasopharyngeal swab, presence of viral mutation(s) within the areas targeted by this assay, and inadequate number of viral copies (<131 copies/mL). A negative result must be combined with clinical observations, patient history, and epidemiological information. The expected result is Negative. Fact Sheet for Patients:  PinkCheek.be Fact Sheet for Healthcare Providers:  GravelBags.it  This test is not yet ap proved or cleared by the [place] and  has been authorized for detection and/or diagnosis of SARS-CoV-2 by FDA under an Emergency Use Authorization (EUA). This EUA will remain  in effect (meaning this test  can be used) for the duration of the COVID-19 declaration under Section 564(b)(1) of the Act, 21 U.S.C. section 360bbb-3(b)(1), unless the authorization is terminated or revoked sooner.    Influenza A by PCR NEGATIVE NEGATIVE Final   Influenza B by PCR NEGATIVE NEGATIVE Final    Comment: (NOTE) The Xpert Xpress SARS-CoV-2/FLU/RSV assay is intended as an aid in  the diagnosis of influenza from Nasopharyngeal swab specimens and  should not be used as a sole basis for treatment. Nasal washings and  aspirates are unacceptable for Xpert Xpress SARS-CoV-2/FLU/RSV  testing. Fact Sheet for Patients: PinkCheek.be Fact Sheet for Healthcare Providers: GravelBags.it This test is not yet approved or cleared by the [place] FDA and  has been authorized for detection and/or diagnosis of SARS-CoV-2 by  FDA under an Emergency Use Authorization (EUA). This EUA will remain  in effect (meaning this test can be used) for the duration of the  Covid-19 declaration under Section 564(b)(1) of the Act, 21  U.S.C. section 360bbb-3(b)(1), unless the authorization is  terminated or revoked. Performed at [facility], [place] [address] [place], [place] [address]       Studies: No results found.  Scheduled Meds: . sodium chloride   Intravenous Once  . vitamin C with rose hips  1,000 mg Oral Q lunch  . brimonidine  1 drop Both Eyes BID  . chlorhexidine gluconate (MEDLINE KIT)  15 mL Mouth Rinse BID  . Chlorhexidine Gluconate Cloth  6 each Topical Daily  . docusate  100 mg Oral BID  . dorzolamide-timolol  1 drop Both Eyes BID  . folic acid  1 mg Oral Q lunch  . mouth rinse  15 mL Mouth Rinse BID  . metoprolol tartrate  12.5 mg Oral BID  . pilocarpine  1 drop Right Eye Q6H  . polyethylene glycol  17 g Oral Daily  . pyridOXINE  50 mg Oral Q lunch  . vitamin B-12  1,000 mcg Oral Q lunch    Continuous Infusions: . sodium chloride    .  sodium chloride       LOS: 4 days     Alma Friendly, MD [facility]  If 7PM-7AM, please contact night-coverage [email or web address] 10/06/2019, 3:31 PM

## 2019-10-06 NOTE — Plan of Care (Signed)

## 2019-10-06 NOTE — Progress Notes (Signed)
   10/06/19 1500  Assess: MEWS Score  Temp (!) 102.1 F (38.9 C)  BP 118/77  Pulse Rate (!) 139  Resp 18  Level of Consciousness Alert  SpO2 98 %  O2 Device Room Air  Assess: MEWS Score  MEWS Temp 2  MEWS Systolic 0  MEWS Pulse 3  MEWS RR 0  MEWS LOC 0  MEWS Score 5  MEWS Score Color Red  Assess: if the MEWS score is Yellow or Red  Were vital signs taken at a resting state? Yes  Focused Assessment Documented focused assessment  Early Detection of Sepsis Score *See Row Information* Low  MEWS guidelines implemented *See Row Information* Yes  Treat  MEWS Interventions Administered scheduled meds/treatments (see new orders place by MD)  Take Vital Signs  Increase Vital Sign Frequency  Red: Q 1hr X 4 then Q 4hr X 4, if remains red, continue Q 4hrs  Escalate  MEWS: Escalate Red: discuss with charge nurse/RN and provider, consider discussing with RRT  Notify: Charge Nurse/RN  Name of Charge Nurse/RN Notified  Mendel Ryder)  Date Charge Nurse/RN Notified 10/06/19  Time Charge Nurse/RN Notified [id number]  Notify: Provider  Provider Name/Title  Horris Latino)  Date Provider Notified 10/06/19  Time Provider Notified 1500  Notification Type Page  Notification Reason Other (Comment)  Response See new orders (Red MEWS)  Date of Provider Response 10/06/19  Time of Provider Response 1500  Document  Patient Outcome Stabilized after interventions  Progress note created (see row info) Yes

## 2019-10-06 NOTE — Progress Notes (Signed)
PT Cancellation Note  Patient Details Name: [name] [name]. MRN: [id number] DOB: [birth year]   Cancelled Treatment:    Reason Eval/Treat Not Completed: Medical issues which prohibited therapy.  Pt was on suprapubic line earlier, held visit to get nsg OK to see.  When this was received and PT returned, pt is off line but has pulses in 140+ range and a fever.  Will re-attempt at another time.   Ramond Dial 10/06/2019, 4:15 PM   Mee Hives, PT MS Acute Rehab Dept. Number: [place]

## 2019-10-06 NOTE — Progress Notes (Signed)
Pharmacy Antibiotic Note  [name] [name]. is a 74 y.o. male admitted on 10/02/2019 with sepsis.  Pharmacy has been consulted for Vanco, Cefepime dosing.   ID: Tmax 102.1 WBC 19.8. r/o sepsis, pt reports postop suprapubic pain. Need to r/o PNA also. PC 0.42  Vanco 5/3>> Cefepime 5/3>>  5/3: UCx:  5/3 BC x 2>>  Plan: Vanco 1750mg  IV x 1 then 1750mg  IV q24h (AUC 534 using Scr 1.07) Cefepime 2g IV q8hr.    Height: 5\' 8"  (172.7 cm) Weight: 86.1 kg (189 lb 13.1 oz) IBW/kg (Calculated) : 68.4  Temp (24hrs), Avg:100.7 F (38.2 C), Min:100 F (37.8 C), Max:102.1 F (38.9 C)  Recent Labs  Lab 10/02/19 0952 10/03/19 0502 10/03/19 2200 10/05/19 0252 10/06/19 0232  WBC 13.2* 8.3  --  13.7* 19.8*  CREATININE 1.79* 1.34* 1.27* 1.11 1.09    Estimated Creatinine Clearance: 64.5 mL/min (by C-G formula based on SCr of 1.09 mg/dL).    No Known Allergies  Shaianne Nucci S. Alford Highland, PharmD, BCPS Clinical Staff Pharmacist [email or web address] [place] 10/06/2019 3:46 PM

## 2019-10-06 NOTE — Addendum Note (Signed)
Addendum  created 10/06/19 0827 by Josephine Igo, CRNA   Order list changed

## 2019-10-07 ENCOUNTER — Telehealth: Payer: Self-pay

## 2019-10-07 DIAGNOSIS — R310 Gross hematuria: Secondary | ICD-10-CM | POA: Diagnosis not present

## 2019-10-07 DIAGNOSIS — N401 Enlarged prostate with lower urinary tract symptoms: Secondary | ICD-10-CM | POA: Diagnosis not present

## 2019-10-07 DIAGNOSIS — H548 Legal blindness, as defined in USA: Secondary | ICD-10-CM | POA: Diagnosis not present

## 2019-10-07 DIAGNOSIS — D62 Acute posthemorrhagic anemia: Secondary | ICD-10-CM | POA: Diagnosis not present

## 2019-10-07 LAB — CBC
HCT: 24.3 % — ABNORMAL LOW (ref 39.0–52.0)
Hemoglobin: 7.4 g/dL — ABNORMAL LOW (ref 13.0–17.0)
MCH: 25.5 pg — ABNORMAL LOW (ref 26.0–34.0)
MCHC: 30.5 g/dL (ref 30.0–36.0)
MCV: 83.8 fL (ref 80.0–100.0)
Platelets: 216 10*3/uL (ref 150–400)
RBC: 2.9 MIL/uL — ABNORMAL LOW (ref 4.22–5.81)
RDW: 21.1 % — ABNORMAL HIGH (ref 11.5–15.5)
WBC: 13.1 10*3/uL — ABNORMAL HIGH (ref 4.0–10.5)
nRBC: 0 % (ref 0.0–0.2)

## 2019-10-07 LAB — BASIC METABOLIC PANEL
Anion gap: 10 (ref 5–15)
BUN: 18 mg/dL (ref 8–23)
CO2: 23 mmol/L (ref 22–32)
Calcium: 7.4 mg/dL — ABNORMAL LOW (ref 8.9–10.3)
Chloride: 107 mmol/L (ref 98–111)
Creatinine, Ser: 0.91 mg/dL (ref 0.61–1.24)
GFR calc Af Amer: >60 mL/min (ref >60)
GFR calc non Af Amer: >60 mL/min (ref >60)
Glucose, Bld: 98 mg/dL (ref 70–99)
Potassium: 4 mmol/L (ref 3.5–5.1)
Sodium: 140 mmol/L (ref 135–145)

## 2019-10-07 LAB — GLUCOSE, CAPILLARY
Glucose-Capillary: 101 mg/dL — ABNORMAL HIGH (ref 70–99)
Glucose-Capillary: 108 mg/dL — ABNORMAL HIGH (ref 70–99)
Glucose-Capillary: 77 mg/dL (ref 70–99)
Glucose-Capillary: 77 mg/dL (ref 70–99)
Glucose-Capillary: 93 mg/dL (ref 70–99)
Glucose-Capillary: 99 mg/dL (ref 70–99)
Glucose-Capillary: 99 mg/dL (ref 70–99)

## 2019-10-07 LAB — PROCALCITONIN: Procalcitonin: 0.35 ng/mL

## 2019-10-07 MED ORDER — LINEZOLID 600 MG PO TABS
600.0000 mg | ORAL_TABLET | Freq: Two times a day (BID) | ORAL | Status: DC
  Administered 2019-10-07 – 2019-10-08 (×2): 600 mg via ORAL
  Filled 2019-10-07 (×3): qty 1

## 2019-10-07 MED ORDER — LINEZOLID 600 MG/300ML IV SOLN
600.0000 mg | Freq: Two times a day (BID) | INTRAVENOUS | Status: DC
  Administered 2019-10-07: 600 mg via INTRAVENOUS
  Filled 2019-10-07 (×2): qty 300

## 2019-10-07 MED ORDER — CEFDINIR 300 MG PO CAPS
300.0000 mg | ORAL_CAPSULE | Freq: Two times a day (BID) | ORAL | Status: DC
  Administered 2019-10-07 – 2019-10-08 (×2): 300 mg via ORAL
  Filled 2019-10-07 (×3): qty 1

## 2019-10-07 NOTE — Progress Notes (Signed)
Pt wanted writer to call doctor and tell he does not want IV antibiotics, he wants to take pills instead. Notified on call M. Sharlet Salina via text page with new orders received.

## 2019-10-07 NOTE — Evaluation (Signed)
Physical Therapy Evaluation Patient Details Name: [name] [name]. MRN: [id number] DOB: 1946-05-15 Today's Date: 10/07/2019   History of Present Illness  74 y.o. male presenting with gross hematuria and catheter obstruction. 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 Large clot evacuated with ongoing bleeding requiring multiple blood products, pt transferred to IR for urgent bilateral prostatic artery embolization on 10/03/2019. Pt transferred to ICU on mechanical ventilation, self-extubated 10/04/2019. PMH legally blind, HTN, and AKI.  Clinical Impression  Pt presents with generalized weakness, decreased mobility and and pain secondary to above. Session focused on bed mobility, transfers, and amb with RW/handheld assistance, required up to min(A) for mobility and verbal cues for negotiating open and closed enviroments. Pt was educated on discharge recommendations home with home health vs SNF. Pt agreeable to SNF, but reports needing to go home first to to pay rent on time as barrier. Pt would continue to benefit from physical therapy while in acute care to address decreased functional mobility to prepare for next level of care.    Follow Up Recommendations SNF;Other (comment)(Pt may decline due to needing to pay rent.)    Equipment Recommendations  None recommended by PT    Recommendations for Other Services       Precautions / Restrictions Precautions Precautions: Fall Precaution Comments: JP drain, subrapubic tube, abdominal precautions for comfort Restrictions Weight Bearing Restrictions: No      Mobility  Bed Mobility Overal bed mobility: Needs Assistance Bed Mobility: Supine to Sit     Supine to sit: Min guard     General bed mobility comments: Pt requested to complete as much mobility as he could on his  own.  Transfers Overall transfer level: Needs assistance Equipment used: Rolling walker (2 wheeled) Transfers: Sit to/from Stand Sit to Stand: Min assist         General transfer comment: minA for powerup into standing, required bed elevated, cues for hand placement.  Ambulation/Gait Ambulation/Gait assistance: Min guard   Assistive device: Rolling walker (2 wheeled);1 person hand held assist Gait Pattern/deviations: Wide base of support;Decreased stride length     General Gait Details: 148 ft RW Min guard with VC for navigating hallway. 151ft 1 hand held assist for guidance and VC for navigation.  Stairs            Wheelchair Mobility    Modified Rankin (Stroke Patients Only)       Balance Overall balance assessment: Needs assistance Sitting-balance support: Bilateral upper extremity supported;Feet supported Sitting balance-Leahy Scale: Fair     Standing balance support: Single extremity supported;Bilateral upper extremity supported;During functional activity Standing balance-Leahy Scale: Fair Standing balance comment: Pt able to stand without UE support. Pt amb with RW and with handheld support. Pt reported touching furniture and walls at home to maintain balance while amb.                             Pertinent Vitals/Pain Pain Assessment: No/denies pain Pain Location: abdomen - pt reports feeling a clot at the moment. Pain Intervention(s): Monitored during session    Home Living Family/patient expects to be discharged to:: Private residence Living Arrangements: Alone Available Help at Discharge: Family;Available PRN/intermittently Type of Home: Apartment Home Access: Stairs to enter   Entrance Stairs-Number of Steps: flight Home Layout: One level Home Equipment: Cane - single point Additional  Comments: Pt reports brother trying to get him a 1 story apartment to move into.    Prior Function Level of Independence: Independent          Comments: pt reports he was using services for transportation but since COVID it has been difficult. Pt reports he liked to spend time at public parks and [employer] in spare time.     Hand Dominance        Extremity/Trunk Assessment   Upper Extremity Assessment Upper Extremity Assessment: Defer to OT evaluation    Lower Extremity Assessment Lower Extremity Assessment: Overall WFL for tasks assessed;Generalized weakness(Pt self reports LE felt weaker but was able to amb with min guard > 268ft.)    Cervical / Trunk Assessment Cervical / Trunk Assessment: Kyphotic  Communication   Communication: No difficulties  Cognition Arousal/Alertness: Awake/alert Behavior During Therapy: WFL for tasks assessed/performed Overall Cognitive Status: Impaired/Different from baseline Area of Impairment: Attention                   Current Attention Level: Divided           General Comments: Pt demonstrated difficulty with divided attention. Was unable to talk and move at same time. Had to stop during bed mobility anf ambulation to answer questions.      General Comments General comments (skin integrity, edema, etc.): Pt incision had no concerning changes. Pt reports he wants to amb and sit in the recliner.    Exercises     Assessment/Plan    PT Assessment Patient needs continued PT services  PT Problem List Decreased mobility;Decreased balance;Decreased knowledge of use of DME;Pain;Decreased activity tolerance;Decreased strength       PT Treatment Interventions Therapeutic activities;Gait training;Therapeutic exercise;Patient/family education;Stair training;Balance training;Functional mobility training;DME instruction    PT Goals (Current goals can be found in the Care Plan section)  Acute Rehab PT Goals Patient Stated Goal: to walk more and to go home PT Goal Formulation: With patient Time For Goal Achievement: 10/21/19 Potential to Achieve Goals: Good     Frequency Min 3X/week   Barriers to discharge Decreased caregiver support;Inaccessible home environment Pt has 1 flight of stairs to enter home and lives alone. Pt reports he needs more therapy and would be willing to try rehab if he could find someone to help him pay his rent.    Co-evaluation               AM-PAC PT "6 Clicks" Mobility  Outcome Measure Help needed turning from your back to your side while in a flat bed without using bedrails?: None Help needed moving from lying on your back to sitting on the side of a flat bed without using bedrails?: A Little Help needed moving to and from a bed to a chair (including a wheelchair)?: A Little Help needed standing up from a chair using your arms (e.g., wheelchair or bedside chair)?: A Little Help needed to walk in hospital room?: A Little Help needed climbing 3-5 steps with a railing? : A Little 6 Click Score: 19    End of Session Equipment Utilized During Treatment: Gait belt Activity Tolerance: Patient tolerated treatment well Patient left: in chair;with call bell/phone within reach;with chair alarm set Nurse Communication: Mobility status PT Visit Diagnosis: Unsteadiness on feet (R26.81);Other abnormalities of gait and mobility (R26.89);Difficulty in walking, not elsewhere classified (R26.2);Muscle weakness (generalized) (M62.81)    Time: [id number] PT Time Calculation (min) (ACUTE ONLY): 36 min   Charges:   PT  Evaluation $PT Eval Moderate Complexity: 1 Mod PT Treatments $Gait Training: 8-22 mins        [employer] SPT 10/07/2019   Rolland Porter 10/07/2019, 10:27 AM

## 2019-10-07 NOTE — Progress Notes (Signed)
PROGRESS NOTE  Aldean Ast. INO:[id number] DOB: 11/07/1945 DOA: 10/02/2019 PCP: [name], NP  HPI/Recap of past 24 hours: HPI from PCCM 74 year old with hx of massively enlarged prostate, BPH with recent placement of suprapubic catheter in early April after being unable to replace his chronic indwelling foley with ongoing hematuria and multiple visits for catheter obstruction. Pt presented again with gross hematuria and catheter obstruction with ABLA with Hgb 7.1 and AKI, taken to OR by urology with complex surgery requiring exploratory laparotomy with cystotomy, bladder clot evacuation, open suprapubic tube placement, urethral dilation in the setting of bulbar urethral stricture done on 10/03/2019. Large 1L clot evacuated with ongoing active bleeding requiring multiple blood products,  then transferred to IR for urgent bilateral prostatic artery embolization on 10/03/2019. Patient was then transferred to ICU on mechanical ventilation, PCCM consulted for further medical and ventilator management.  Patient was eventually stabilized, self extubated on 10/04/2019.  [facility] hospital[facility] assumed care on 10/05/2019.     Today, pt denies any new complaints. Pt is very argumentative about overall care, took a while to get a CXR as he was concerned about radiation. Pt stated its due to his intubation he developed PNA. Pt denies any chest pain, SOB, N/V, still with some suprapubic pain, no longer febrile since on AB.   Assessment/Plan: Principal Problem:   Anemia due to blood loss, acute Active Problems:   Hypertension   Hematuria   Acute blood loss anemia   Legally blind   AKI (acute kidney injury) (HCC)   BPH (benign prostatic hyperplasia)   Swelling of lower leg   Gross hematuria/BPH/bulbar urethral stricture/massive prostate/bladder spasms Status post exploratory laparotomy with cystotomy, bladder clot evacuation, open suprapubic tube placement, urethral dilation in the setting of  bulbar urethral stricture, urgent IR bilateral prostatic artery embolization 10/03/2019 Large 1L clot evacuated with ongoing active bleeding requiring multiple blood products Management per urology, signed off  Acute blood loss anemia/postop anemia Presented with hemoglobin of 7.1, down from 10.6 about 10 days ago EBL during surgery about 2 L Hemoglobin stable status post multiple transfusions (about 6 units of PRBC and 2 units of FFP) Daily CBC  Sepsis likely 2/2 HCAP Last temp spike on 10/06/19, with leukocytosis, tachycardia Currently afebrile, with resolving leukocytosis UA showed large hemoglobin, moderate leukocytes, rare bacteria, WBC 6-10 UC grew 10,000 Staph epidermidis BC x2 NGTD Chest x-ray showed new bibasilar airspace opacities concerning for multifocal pneumonia Procalcitonin elevated, will trend Continue cefepime, switch vancomycin to p.o. Zyvox for now, due to shortage of reagent for Vanco trough Telemetry Monitor closely  Hypertension BP still soft Hold home hydrochlorothiazide, continue metoprolol  Bilateral lower extremity edema Ultrasound Doppler negative for DVT Elevate legs  Obesity Lifestyle modification advised          Malnutrition Type:  Nutrition Problem: Inadequate oral intake Etiology: inability to eat   Malnutrition Characteristics:  Signs/Symptoms: NPO status   Nutrition Interventions:  Interventions: Magic cup, MVI    Estimated body mass index is 31.48 kg/m as calculated from the following:   Height as of this encounter: '5\' 8"'$  (1.727 m).   Weight as of this encounter: 93.9 kg.     Code Status: Full  Family Communication: Discussed with patient extensively  Disposition Plan: Status is: Inpatient  Remains inpatient appropriate because:Inpatient level of care appropriate due to severity of illness   Dispo: The patient is from: Home  Anticipated d/c is to: SNF              Anticipated d/c date is: 2 days               Patient currently is not medically stable to d/c.  Currently ongoing treatment for sepsis and HCAP. TOC also notified about SNF placement    Consultants:  Urology  PCCM  Procedures:  Extensive surgery as mentioned above  Antimicrobials:  Cefepime  Zyvox  DVT prophylaxis: SCDs   Objective: Vitals:   10/07/19 0306 10/07/19 0601 10/07/19 0634 10/07/19 1028  BP: 120/84  111/69 124/79  Pulse: 76  84 94  Resp: '17  17 18  '$ Temp: 98.8 F (37.1 C)  98.8 F (37.1 C) 99.6 F (37.6 C)  TempSrc: Oral  Oral Oral  SpO2: 97%  100% 100%  Weight:  93.9 kg    Height:        Intake/Output Summary (Last 24 hours) at 10/07/2019 1513 Last data filed at 10/07/2019 1218 Gross per 24 hour  Intake 220 ml  Output 1920 ml  Net -1700 ml   Filed Weights   10/02/19 0328 10/04/19 0500 10/07/19 0601  Weight: 86.2 kg 86.1 kg 93.9 kg    Exam:  General: NAD, legally blind  Cardiovascular: S1, S2 present  Respiratory:  Diminished breath sounds bilaterally  Abdomen: Soft, nontender, nondistended, bowel sounds present, Foley catheter draining yellow urine, JP drain noted  Musculoskeletal: +1 bilateral pedal edema noted  Skin: Normal  Psychiatry: Normal mood   Data Reviewed: CBC: Recent Labs  Lab 10/02/19 0952 10/02/19 2335 10/03/19 0502 10/03/19 0558 10/04/19 0347 10/04/19 1030 10/05/19 0252 10/06/19 0232 10/07/19 0243  WBC 13.2*  --  8.3  --   --   --  13.7* 19.8* 13.1*  NEUTROABS 10.3*  --   --   --   --   --   --   --   --   HGB 7.1*   < > 9.2*   < > 8.3* 8.5* 8.2* 8.1* 7.4*  HCT 24.6*   < > 28.1*   < > 25.6* 27.2* 26.1* 26.6* 24.3*  MCV 70.5*  --  78.7*  --   --   --  82.6 83.1 83.8  PLT 361  --  161  160  --   --   --  184 221 216   < > = values in this interval not displayed.   Basic Metabolic Panel: Recent Labs  Lab 10/02/19 1236 10/02/19 2335 10/03/19 0502 10/03/19 2229 10/03/19 7989 10/03/19 2200 10/05/19 0252 10/05/19 2119 10/06/19 0232  10/06/19 1815 10/07/19 0243  NA  --    < > 134*   < > 134* 136 140  --  139  --  140  K  --    < > 3.7   < > 4.0 4.1 3.8  --  4.3  --  4.0  CL  --   --  102  --   --  105 106  --  105  --  107  CO2  --   --  22  --   --  23 25  --  28  --  23  GLUCOSE  --   --  159*  --   --  162* 105*  --  111*  --  98  BUN  --   --  25*  --   --  25* 22  --  16  --  18  CREATININE  --   --  1.34*  --   --  1.27* 1.11  --  1.09  --  0.91  CALCIUM  --   --  8.4*  --   --  7.9* 7.9*  --  7.7*  --  7.4*  MG 1.9  --   --   --   --  1.9  --  1.9  --  1.8  --   PHOS 5.0*  --   --   --   --   --   --   --   --   --   --    < > = values in this interval not displayed.   GFR: Estimated Creatinine Clearance: 80.4 mL/min (by C-G formula based on SCr of 0.91 mg/dL). Liver Function Tests: Recent Labs  Lab 10/02/19 0953 10/03/19 0502  AST 46* 29  ALT 35 24  ALKPHOS 95 58  BILITOT 0.8 1.4*  PROT 6.5 4.7*  ALBUMIN 3.1* 2.2*   No results for input(s): LIPASE, AMYLASE in the last 168 hours. No results for input(s): AMMONIA in the last 168 hours. Coagulation Profile: Recent Labs  Lab 10/02/19 0953 10/03/19 0502  INR 1.1 1.2   Cardiac Enzymes: No results for input(s): CKTOTAL, CKMB, CKMBINDEX, TROPONINI in the last 168 hours. BNP (last 3 results) No results for input(s): PROBNP in the last 8760 hours. HbA1C: No results for input(s): HGBA1C in the last 72 hours. CBG: Recent Labs  Lab 10/06/19 1952 10/07/19 0010 10/07/19 0308 10/07/19 0808 10/07/19 1151  GLUCAP 157* 108* 93 77 77   Lipid Profile: No results for input(s): CHOL, HDL, LDLCALC, TRIG, CHOLHDL, LDLDIRECT in the last 72 hours. Thyroid Function Tests: No results for input(s): TSH, T4TOTAL, FREET4, T3FREE, THYROIDAB in the last 72 hours. Anemia Panel: No results for input(s): VITAMINB12, FOLATE, FERRITIN, TIBC, IRON, RETICCTPCT in the last 72 hours. Urine analysis:    Component Value Date/Time   COLORURINE STRAW (A) 10/06/2019 0859     APPEARANCEUR CLEAR 10/06/2019 0859   LABSPEC 1.005 10/06/2019 0859   PHURINE 5.0 10/06/2019 0859   GLUCOSEU NEGATIVE 10/06/2019 0859   HGBUR LARGE (A) 10/06/2019 0859   BILIRUBINUR NEGATIVE 10/06/2019 0859   KETONESUR NEGATIVE 10/06/2019 0859   PROTEINUR 30 (A) 10/06/2019 0859   UROBILINOGEN 0.2 12/27/2012 2241   NITRITE NEGATIVE 10/06/2019 0859   LEUKOCYTESUR MODERATE (A) 10/06/2019 0859   Sepsis Labs: '@LABRCNTIP'$ (procalcitonin:4,lacticidven:4)  ) Recent Results (from the past 240 hour(s))  Respiratory Panel by RT PCR (Flu A&B, Covid) - Nasopharyngeal Swab     Status: None   Collection Time: 10/02/19 12:08 PM   Specimen: Nasopharyngeal Swab  Result Value Ref Range Status   SARS Coronavirus 2 by RT PCR NEGATIVE NEGATIVE Final    Comment: (NOTE) SARS-CoV-2 target nucleic acids are NOT DETECTED. The SARS-CoV-2 RNA is generally detectable in upper respiratoy specimens during the acute phase of infection. The lowest concentration of SARS-CoV-2 viral copies this assay can detect is 131 copies/mL. A negative result does not preclude SARS-Cov-2 infection and should not be used as the sole basis for treatment or other patient management decisions. A negative result may occur with  improper specimen collection/handling, submission of specimen other than nasopharyngeal swab, presence of viral mutation(s) within the areas targeted by this assay, and inadequate number of viral copies (<131 copies/mL). A negative result must be combined with clinical observations, patient history, and epidemiological information. The expected result is Negative. Fact Sheet for Patients:  PinkCheek.be Fact Sheet for Healthcare Providers:  GravelBags.it This test is not yet ap proved or cleared by the [place] FDA and  has been authorized for detection and/or diagnosis of SARS-CoV-2 by FDA under an Emergency Use Authorization (EUA). This EUA  will remain  in effect (meaning this test can be used) for the duration of the COVID-19 declaration under Section 564(b)(1) of the Act, 21 U.S.C. section 360bbb-3(b)(1), unless the authorization is terminated or revoked sooner.    Influenza A by PCR NEGATIVE NEGATIVE Final   Influenza B by PCR NEGATIVE NEGATIVE Final    Comment: (NOTE) The Xpert Xpress SARS-CoV-2/FLU/RSV assay is intended as an aid in  the diagnosis of influenza from Nasopharyngeal swab specimens and  should not be used as a sole basis for treatment. Nasal washings and  aspirates are unacceptable for Xpert Xpress SARS-CoV-2/FLU/RSV  testing. Fact Sheet for Patients: PinkCheek.be Fact Sheet for Healthcare Providers: GravelBags.it This test is not yet approved or cleared by the [place] FDA and  has been authorized for detection and/or diagnosis of SARS-CoV-2 by  FDA under an Emergency Use Authorization (EUA). This EUA will remain  in effect (meaning this test can be used) for the duration of the  Covid-19 declaration under Section 564(b)(1) of the Act, 21  U.S.C. section 360bbb-3(b)(1), unless the authorization is  terminated or revoked. Performed at [facility], [place] [address] [place], [place] [address]   Culture, blood (routine x 2)     Status: None (Preliminary result)   Collection Time: 10/06/19  8:14 AM   Specimen: BLOOD  Result Value Ref Range Status   Specimen Description BLOOD RIGHT ANTECUBITAL  Final   Special Requests   Final    BOTTLES DRAWN AEROBIC AND ANAEROBIC Blood Culture adequate volume   Culture   Final    NO GROWTH < 24 HOURS Performed at [facility], [place] [address] [place], [place] [address]    Report Status PENDING  Incomplete  Culture, blood (routine x 2)     Status: None (Preliminary result)   Collection Time: 10/06/19  8:16 AM   Specimen: BLOOD LEFT ARM  Result Value Ref Range Status   Specimen Description  BLOOD LEFT ARM  Final   Special Requests   Final    BOTTLES DRAWN AEROBIC AND ANAEROBIC Blood Culture adequate volume   Culture   Final    NO GROWTH < 24 HOURS Performed at [facility], [place] [address] [place], [place] [address]    Report Status PENDING  Incomplete  Culture, Urine     Status: Abnormal (Preliminary result)   Collection Time: 10/06/19  8:37 AM   Specimen: Urine, Random  Result Value Ref Range Status   Specimen Description URINE, RANDOM  Final   Special Requests   Final    NONE Performed at [facility], [place] [address] [place], [place] [address]    Culture 10,000 COLONIES/mL STAPHYLOCOCCUS EPIDERMIDIS (A)  Final   Report Status PENDING  Incomplete      Studies: DG Chest Port 1 View  Result Date: 10/06/2019 CLINICAL DATA:  Sepsis EXAM: PORTABLE CHEST 1 VIEW COMPARISON:  10/03/2019 FINDINGS: There is scattered hazy airspace opacities bilaterally which have developed since the prior study. These are most evident at the lung bases. The lung volumes are slightly low. There is no pneumothorax. There may be a small left-sided pleural effusion. The heart size is unremarkable. IMPRESSION: New bibasilar airspace opacities concerning for  multifocal pneumonia in the appropriate setting. Electronically Signed   By: Constance Holster M.D.   On: 10/06/2019 16:36    Scheduled Meds: . sodium chloride   Intravenous Once  . vitamin C with rose hips  1,000 mg Oral Q lunch  . brimonidine  1 drop Both Eyes BID  . chlorhexidine gluconate (MEDLINE KIT)  15 mL Mouth Rinse BID  . Chlorhexidine Gluconate Cloth  6 each Topical Daily  . docusate  100 mg Oral BID  . dorzolamide-timolol  1 drop Both Eyes BID  . folic acid  1 mg Oral Q lunch  . mouth rinse  15 mL Mouth Rinse BID  . metoprolol tartrate  12.5 mg Oral BID  . pilocarpine  1 drop Right Eye Q6H  . polyethylene glycol  17 g Oral Daily  . pyridOXINE  50 mg Oral Q lunch  . vitamin B-12  1,000 mcg Oral Q lunch     Continuous Infusions: . sodium chloride    . ceFEPime (MAXIPIME) IV 2 g (10/07/19 0817)  . linezolid (ZYVOX) IV       LOS: 5 days     Alma Friendly, MD [facility]  If 7PM-7AM, please contact night-coverage [email or web address] 10/07/2019, 3:13 PM

## 2019-10-07 NOTE — TOC Initial Note (Signed)
Transition of Care (TOC) - Initial/Assessment Note    Patient Details  Name: [name] [name]. MRN: [id number] Date of Birth: 1946/04/10  [facility]) CM/SW Contact:    Marilu Favre, RN Phone Number: 10/07/2019, 4:17 PM  Clinical Narrative:                 Spoke to patient at bedside. Patient from home alone. Discussed PT recommendation for SNF. Patient voiced understanding but wants to go home. Patient lives alone and stated he has called his insurance company and was told they can arrange transportation for him to go to appointments and run errands.    NCM discussed  HHPT and HHRN. Patient understands that home health will not cook/clean. A couple visits a week for 30 minutes at a time. Patient voiced understanding.   Patient has no preference. Tommi Rumps with [facility] accepted referral.  Patient reported to NCM his cane has been misplaced here in the hospital. NCM passed message on to bedside nurse [place].  Expected Discharge Plan: [place] Barriers to Discharge: Continued Medical Work up   Patient Goals and CMS Choice Patient states their goals for this hospitalization and ongoing recovery are:: to go home CMS Medicare.gov Compare Post Acute Care list provided to:: Patient Choice offered to / list presented to : Patient  Expected Discharge Plan and Services Expected Discharge Plan: [place]   Discharge Planning Services: CM Consult Post Acute Care Choice: [place] arrangements for the past 2 months: Apartment                 DME Arranged: N/A         HH Arranged: PT, RN [place] Agency: [place] Date [facility] Agency Contacted: 10/07/19 Time [place]: [id number] Representative spoke with at [place]: Tommi Rumps  Prior Living Arrangements/Services Living arrangements for the past 2 months: Apartment Lives with:: Self Patient language and need for interpreter reviewed:: Yes Do you feel safe going back  to the place where you live?: Yes      Need for Family Participation in Patient Care: Yes (Comment)   Current home services: DME Criminal Activity/Legal Involvement Pertinent to Current Situation/Hospitalization: No - Comment as needed  Activities of Daily Living   ADL Screening (condition at time of admission) Patient's cognitive ability adequate to safely complete daily activities?: Yes Is the patient deaf or have difficulty hearing?: No Does the patient have difficulty seeing, even when wearing glasses/contacts?: Yes(wears glasses, left in room with other personal belongings) Does the patient have difficulty concentrating, remembering, or making decisions?: No Patient able to express need for assistance with ADLs?: Yes Does the patient have difficulty dressing or bathing?: No Independently performs ADLs?: Yes (appropriate for developmental age)  Permission Sought/Granted   Permission granted to share information with : No              Emotional Assessment Appearance:: Appears stated age Attitude/Demeanor/Rapport: Engaged Affect (typically observed): Accepting Orientation: : Oriented to Self, Oriented to Place, Oriented to  Time, Oriented to Situation Alcohol / Substance Use: Not Applicable Psych Involvement: No (comment)  Admission diagnosis:  Acute blood loss anemia [D62] Gross hematuria [R31.0] Anemia, unspecified type [D64.9] Patient Active Problem List   Diagnosis Date Noted  . Swelling of lower leg 10/02/2019  . AKI (acute kidney injury) ([place]) 05/25/2019  . BPH (benign prostatic hyperplasia) 05/25/2019  . Anemia due to blood loss, chronic   . Acute  blood loss anemia 01/14/2018  . Hypokalemia 01/14/2018  . 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 Hematuria 12/23/2012  . Chest pain 12/23/2012  . Anemia due to blood loss, acute 12/23/2012  . Elevated prostate specific antigen (PSA)   . Hypertension   . Edema    PCP:  [name], NP Pharmacy:   [facility], [place] AT [facility] [address] [place] [address] Phone: [phone] Fax: [phone]     Social Determinants of Health (SDOH) Interventions    Readmission Risk Interventions No flowsheet data found.

## 2019-10-07 NOTE — Telephone Encounter (Signed)
Patient called and states he is inside the hospital right now and he had surgery on 10/02/2019. Patient states that in order to leave the hospital he needs to have clearance from PCP [name] [name], [name] American, NP. Patient states that he wants referral for Home Health to come and check wound and keep it clean. Patient states that he also needs Home Health to help him run errands or to do errands for him . Please Advise.

## 2019-10-07 NOTE — Progress Notes (Signed)
Pt refused IV meds and demanded RN remove his IV.  He was made aware Vanc was changed to PO but that there wasn't a PO replacement for Cefepime that provided coverage as broad, that he may extend hospitalization time, may now continue improving.  He reiterated he wanted only PO to the RN.  Spoke with pharmacy, no equal replacement, but suggested cefdinir in this situation.

## 2019-10-07 NOTE — Telephone Encounter (Signed)
Called patient and notified her. Patient states that she will call [facility] and have them fax [place] the necessary paper work she needs filled out. From that point the paper work will be given to you, signed off, and faxed back.

## 2019-10-07 NOTE — Progress Notes (Signed)
Patient c/o pain on the IV site, refused IV antibiotics. Insisted to remove IV left forearm.

## 2019-10-07 NOTE — Progress Notes (Signed)
Nutrition Follow-up  DOCUMENTATION CODES:   Not applicable  INTERVENTION:   -Continue Magic cup BID with meals, each supplement provides 290 kcal and 9 grams of protein -Continue MVI with minerals daily  NUTRITION DIAGNOSIS:   Inadequate oral intake related to inability to eat as evidenced by NPO status.  Progressing; advanced to a heart healthy diet on 10/04/19  GOAL:   Patient will meet greater than or equal to 90% of their needs  Progressing  MONITOR:   PO intake, Supplement acceptance, Labs, Weight trends, Skin, I & O's  REASON FOR ASSESSMENT:   Ventilator, Consult Enteral/tube feeding initiation and management  ASSESSMENT:   74 yo male admitted with hematuria, ABLA. S/P ex lap with extraction of > 1 L blood clot from the bladder, then required bilateral prostatic artery embolization. PMH includes chronic indwelling foley since 2014 with recent placement of suprapubic catheter, BPH, HTN, vision impairment.  5/1- extubated, passed bedside swallow screen; transferred from ICU to floor  Reviewed I/O's: -1.5 L x 24 hours and -29.4 L since admission  UOP: 2.1 L x 24 hours  Drain output: 60 ml x 24 hours  Case discussed with RN, who reports pt with great appetite and consuming diet without difficulty. Due to visual impairment, pt eats with his hands.   Spoke with pt, who was sitting in recliner chair at time of visit. He reports he feels well and happy to have had a BM today. He enjoys the food and reports "I never have a problem with eating". He reports consuming 3 meals per day PTA. He denies any weight loss. He is anxious for discharge, as he is eager to go home. RD provided emotional support.   Medications reviewed and include folic acid, vitamin 0000000, vitamin B-6, and miralax.   Labs reviewed: CBGS: 77-108.   NUTRITION - FOCUSED PHYSICAL EXAM:    Most Recent Value  Orbital Region  No depletion  Upper Arm Region  No depletion  Thoracic and Lumbar Region  No  depletion  Buccal Region  No depletion  Temple Region  No depletion  Clavicle Bone Region  No depletion  Clavicle and Acromion Bone Region  No depletion  Scapular Bone Region  No depletion  Dorsal Hand  No depletion  Patellar Region  No depletion  Anterior Thigh Region  No depletion  Posterior Calf Region  No depletion  Edema (RD Assessment)  None  Hair  Reviewed  Eyes  Reviewed  Mouth  Reviewed  Skin  Reviewed  Nails  Reviewed       Diet Order:   Diet Order            Diet Heart Room service appropriate? Yes; Fluid consistency: Thin  Diet effective now              EDUCATION NEEDS:   No education needs have been identified at this time  Skin:  Skin Assessment: Skin Integrity Issues: Skin Integrity Issues:: Incisions Incisions: perineum  Last BM:  Unknown  Height:   Ht Readings from Last 1 Encounters:  10/02/19 5\' 8"  (1.727 m)    Weight:   Wt Readings from Last 1 Encounters:  10/07/19 93.9 kg    Ideal Body Weight:  70 kg  BMI:  Body mass index is 31.48 kg/m.  Estimated Nutritional Needs:   Kcal:  1950-2150  Protein:  95-100 grams  Fluid:  > 1.9 L    Loistine Chance, RD, LDN, [place] Registered Dietitian II Certified Diabetes Care and Education  Specialist Please refer to [facility] for RD and/or RD on-call/weekend/after hours pager

## 2019-10-07 NOTE — Telephone Encounter (Signed)
The hospital doctors will be in charge of all discharge orders. Home health will be order for therapy if needed however this does not include help running errands or doing house work, he will have to hire someone independently for that.

## 2019-10-08 ENCOUNTER — Encounter (HOSPITAL_COMMUNITY): Payer: Self-pay

## 2019-10-08 ENCOUNTER — Emergency Department (HOSPITAL_COMMUNITY)
Admission: EM | Admit: 2019-10-08 | Discharge: 2019-10-09 | Disposition: A | Discharge status: Home / Self Care | Payer: Medicare Other | Attending: Emergency Medicine | Admitting: Emergency Medicine

## 2019-10-08 DIAGNOSIS — R509 Fever, unspecified: Secondary | ICD-10-CM | POA: Insufficient documentation

## 2019-10-08 DIAGNOSIS — Z79899 Other long term (current) drug therapy: Secondary | ICD-10-CM | POA: Diagnosis not present

## 2019-10-08 DIAGNOSIS — R338 Other retention of urine: Secondary | ICD-10-CM | POA: Diagnosis not present

## 2019-10-08 DIAGNOSIS — N401 Enlarged prostate with lower urinary tract symptoms: Secondary | ICD-10-CM | POA: Diagnosis not present

## 2019-10-08 DIAGNOSIS — I10 Essential (primary) hypertension: Secondary | ICD-10-CM | POA: Insufficient documentation

## 2019-10-08 DIAGNOSIS — Z87891 Personal history of nicotine dependence: Secondary | ICD-10-CM | POA: Insufficient documentation

## 2019-10-08 DIAGNOSIS — D62 Acute posthemorrhagic anemia: Secondary | ICD-10-CM | POA: Diagnosis not present

## 2019-10-08 LAB — BASIC METABOLIC PANEL
Anion gap: 12 (ref 5–15)
Anion gap: 12 (ref 5–15)
BUN: 17 mg/dL (ref 8–23)
BUN: 18 mg/dL (ref 8–23)
CO2: 20 mmol/L — ABNORMAL LOW (ref 22–32)
CO2: 20 mmol/L — ABNORMAL LOW (ref 22–32)
Calcium: 7.5 mg/dL — ABNORMAL LOW (ref 8.9–10.3)
Calcium: 8 mg/dL — ABNORMAL LOW (ref 8.9–10.3)
Chloride: 104 mmol/L (ref 98–111)
Chloride: 105 mmol/L (ref 98–111)
Creatinine, Ser: 0.98 mg/dL (ref 0.61–1.24)
Creatinine, Ser: 1.19 mg/dL (ref 0.61–1.24)
GFR calc Af Amer: >60 mL/min (ref >60)
GFR calc Af Amer: >60 mL/min (ref >60)
GFR calc non Af Amer: >60 mL/min (ref >60)
GFR calc non Af Amer: >60 mL/min (ref >60)
Glucose, Bld: 94 mg/dL (ref 70–99)
Glucose, Bld: 113 mg/dL — ABNORMAL HIGH (ref 70–99)
Potassium: 4.3 mmol/L (ref 3.5–5.1)
Potassium: 4.1 mmol/L (ref 3.5–5.1)
Sodium: 136 mmol/L (ref 135–145)
Sodium: 137 mmol/L (ref 135–145)

## 2019-10-08 LAB — PROCALCITONIN: Procalcitonin: 0.4 ng/mL

## 2019-10-08 LAB — CBC
HCT: 25.2 % — ABNORMAL LOW (ref 39.0–52.0)
HCT: 28.3 % — ABNORMAL LOW (ref 39.0–52.0)
Hemoglobin: 7.9 g/dL — ABNORMAL LOW (ref 13.0–17.0)
Hemoglobin: 8.5 g/dL — ABNORMAL LOW (ref 13.0–17.0)
MCH: 26.2 pg (ref 26.0–34.0)
MCH: 25.3 pg — ABNORMAL LOW (ref 26.0–34.0)
MCHC: 31.3 g/dL (ref 30.0–36.0)
MCHC: 30 g/dL (ref 30.0–36.0)
MCV: 83.7 fL (ref 80.0–100.0)
MCV: 84.2 fL (ref 80.0–100.0)
Platelets: 283 10*3/uL (ref 150–400)
Platelets: 351 10*3/uL (ref 150–400)
RBC: 3.01 MIL/uL — ABNORMAL LOW (ref 4.22–5.81)
RBC: 3.36 MIL/uL — ABNORMAL LOW (ref 4.22–5.81)
RDW: 20.8 % — ABNORMAL HIGH (ref 11.5–15.5)
RDW: 20.8 % — ABNORMAL HIGH (ref 11.5–15.5)
WBC: 10.5 10*3/uL (ref 4.0–10.5)
WBC: 11.9 10*3/uL — ABNORMAL HIGH (ref 4.0–10.5)
nRBC: 0 % (ref 0.0–0.2)
nRBC: 0.2 % (ref 0.0–0.2)

## 2019-10-08 MED ORDER — OXYBUTYNIN CHLORIDE 5 MG PO TABS: 5.0000 mg | ORAL_TABLET | Freq: Three times a day (TID) | ORAL | 0 refills | Status: DC | PRN

## 2019-10-08 MED ORDER — CEFDINIR 300 MG PO CAPS
300.0000 mg | ORAL_CAPSULE | Freq: Two times a day (BID) | ORAL | 0 refills | Status: AC
Start: 2019-10-08 — End: 2019-10-13

## 2019-10-08 MED ORDER — AZITHROMYCIN 250 MG PO TABS
ORAL_TABLET | ORAL | 0 refills | Status: AC
Start: 2019-10-08 — End: 2019-10-13

## 2019-10-08 MED FILL — OXYBUTYNIN 5 MG TABLET: 5 | 10 days supply | Qty: 30 | Fill #0

## 2019-10-08 MED FILL — AZITHROMYCIN 250 MG TABLET: 250 | 5 days supply | Qty: 6 | Fill #0

## 2019-10-08 MED FILL — CEFDINIR 300 MG CAPSULE: 300 | 5 days supply | Qty: 10 | Fill #0

## 2019-10-08 NOTE — ED Triage Notes (Signed)
Pt reports that he thinks he may have removed one of his tubes by accident, pt has foley, suprapubic cath and JP drain all in place and intact, pt has a stopper in the suprapubic cath, instead of a bag, reports he was discharged today and does not know how to care for them.

## 2019-10-08 NOTE — Discharge Summary (Signed)
Physician Discharge Summary  Aldean Ast. [id number] DOB: 04/22/1946 DOA: 10/02/2019  PCP: [name], NP  Admit date: 10/02/2019 Discharge date: 10/08/2019  Admitted From: Home Disposition:  Home  Recommendations for Outpatient Follow-up:  1. Follow up with PCP in 1-2 weeks 2. Follow up with Urology as scheduled  Home Health:PT, RN   Discharge Condition:Stable CODE STATUS:Full Diet recommendation: Regular   Brief/Interim Summary: 74 year old with hx of massively enlarged prostate, BPH with recent placement of suprapubic catheter in early April after being unable to replace his chronic indwelling foley with ongoing hematuria and multiple visits for catheter obstruction. Pt presented again with gross hematuria and catheter obstruction with ABLA with Hgb 7.1 and AKI, taken to OR by urology with complex surgery requiring exploratory laparotomy with cystotomy, bladder clot evacuation, open suprapubic tube placement, urethral dilation in the setting of bulbar urethral stricture done on 10/03/2019.Large 1L clot evacuated with ongoing active bleeding requiring multiple blood products, then transferred to IR for urgent bilateral prostatic artery embolization on 10/03/2019.Patient was then transferred to ICU on mechanical ventilation, PCCM consulted for further medical and ventilator management.  Patient was eventually stabilized, self extubated on 10/04/2019.  [facility] hospitalist assumed care on 10/05/2019.  Discharge Diagnoses:  Principal Problem:   Anemia due to blood loss, acute Active Problems:   Hypertension   Hematuria   Acute blood loss anemia   Legally blind   AKI (acute kidney injury) (HCC)   BPH (benign prostatic hyperplasia)   Swelling of lower leg  Gross hematuria/BPH/bulbar urethral stricture/massive prostate/bladder spasms Status post exploratory laparotomy with cystotomy, bladder clot evacuation, open suprapubic tube placement, urethral dilation in the setting of  bulbar urethral stricture, urgent IR bilateral prostatic artery embolization 10/03/2019 Large 1L clot evacuated with ongoing active bleeding requiring multiple blood products Management per urology, signed off  Acute blood loss anemia/postop anemia Presented with hemoglobin of 7.1, down from 10.6 about 10 days ago EBL during surgery about 2 L Hemoglobin stable status post multiple transfusions (about 6 units of PRBC and 2 units of FFP) Hgb since remained stable  Sepsis likely 2/2 HCAP Last temp spike on 10/06/19, with leukocytosis, tachycardia Currently afebrile, with resolving leukocytosis UA showed large hemoglobin, moderate leukocytes, rare bacteria, WBC 6-10 UC grew 10,000 Staph epidermidis BC x2 NGTD Chest x-ray showed new bibasilar airspace opacities concerning for multifocal pneumonia Procalcitonin elevated Continued of cefepime and zyvox. Will complete course of azithromycin and omnicef on d/c Leukocytosis normalized Pt remained on room air  Hypertension BP stable Resume home bp meds on d/c  Bilateral lower extremity edema Ultrasound Doppler negative for DVT Recommend elevate legs  Obesity Lifestyle modification advised  Discharge Instructions   Allergies as of 10/08/2019   No Known Allergies     Medication List    STOP taking these medications   ciprofloxacin 500 MG tablet Commonly known as: CIPRO     TAKE these medications   aspirin-acetaminophen-caffeine 250-250-65 MG tablet Commonly known as: EXCEDRIN MIGRAINE Take 1 tablet by mouth daily as needed (pain).   azithromycin 250 MG tablet Commonly known as: Zithromax Z-Pak Take 2 tablets (500 mg) on  Day 1,  followed by 1 tablet (250 mg) once daily on Days 2 through 5.   brimonidine 0.2 % ophthalmic solution Commonly known as: ALPHAGAN Place 1 drop into both eyes 2 (two) times daily.   cefdinir 300 MG capsule Commonly known as: OMNICEF Take 1 capsule (300 mg total) by mouth 2 (two) times daily  for  5 days.   dorzolamide-timolol 22.3-6.8 MG/ML ophthalmic solution Commonly known as: COSOPT APPOINTMENT OVERDUE, instill 1 drop in both eyes twice daily What changed:   how much to take  how to take this  when to take this  additional instructions   folic acid [id number] MCG tablet Commonly known as: FOLVITE Take 800 mcg by mouth daily with lunch.   hydrochlorothiazide 12.5 MG capsule Commonly known as: MICROZIDE Take 1 capsule (12.5 mg total) by mouth daily.   Magnesium 250 MG Tabs Take 250 mg by mouth daily as needed (migraine headache).   metoprolol tartrate 25 MG tablet Commonly known as: LOPRESSOR Take 12.5 mg by mouth 2 (two) times daily.   oxybutynin 5 MG tablet Commonly known as: DITROPAN Take 1 tablet (5 mg total) by mouth every 8 (eight) hours as needed for bladder spasms.   pilocarpine 2 % ophthalmic solution Commonly known as: PILOCAR Place 1 drop into the right eye every 6 (six) hours.   potassium chloride SA 20 MEQ tablet Commonly known as: KLOR-CON Take 1 tablet (20 mEq total) by mouth daily.   pyridOXINE 50 MG tablet Commonly known as: VITAMIN B-6 Take 50 mg by mouth daily with lunch.   vitamin B-12 1000 MCG tablet Commonly known as: CYANOCOBALAMIN Take 1,000 mcg by mouth daily with lunch.   vitamin C with rose hips 1000 MG tablet Take 1,000 mg by mouth daily with lunch.      Follow-up Information    [name], NP. Schedule an appointment as soon as possible for a visit in 2 week(s).   Specialty: Geriatric Medicine Contact information: [address] [place] [address] [phone]        [facility]. Schedule an appointment as soon as possible for a visit.   Contact information: [place] [phone]         No Known Allergies  Consultations:  Urology  PCCM  Procedures/Studies: IR Angiogram Extremity Right  Result Date: 10/06/2019 INDICATION:  74 year old male with massive benign prosthetic hypertrophy. Interventional Radiology was called in for emergent pelvic arteriogram and prostatic artery embolization due to uncontrollable bleeding in the operating room and transfusion requirement. Patient presents postoperative, intubated and stable for the time being. 3 units packed red blood cells have been transfused and bleeding appears to be tamponaded. We will proceed with emergent embolization. EXAM: IR ULTRASOUND GUIDANCE VASC ACCESS RIGHT; ADDITIONAL ARTERIOGRAPHY; IR EMBO ART VEN HEMORR LYMPH EXTRAV INC GUIDE ROADMAPPING; PELVIC SELECTIVE ARTERIOGRAPHY 1. Ultrasound-guided vascular access right common femoral artery 2. Catheterization of the left internal iliac artery with arteriogram 3. Catheterization of the prostatic artery with arteriogram 4. Particle embolization of the prosthetic artery. 5. Catheterization of the right external iliac artery with arteriogram 6. Catheterization of the right internal iliac artery with arteriogram 7. Catheterization of the right gluteal pudendal trunk with arteriogram. 8. Catheterization of the right common trunk of the prosthetic artery and middle rectal artery with arteriogram 9. Catheterization of the right prosthetic artery with arteriogram 10. Coil embolization of the right prosthetic artery MEDICATIONS: None ANESTHESIA/SEDATION: General anesthesia performed by the anesthesiology service CONTRAST:  64mL OMNIPAQUE IOHEXOL 300 MG/ML  SOLN FLUOROSCOPY TIME:  Fluoroscopy Time: 17 minutes 0 seconds (2808 mGy). COMPLICATIONS: None immediate. PROCEDURE: Informed consent was obtained from the patient following explanation of the procedure, risks, benefits and alternatives. The patient understands, agrees and consents for the procedure. All questions were addressed. A time out was performed prior  to the initiation of the procedure. Maximal barrier sterile technique utilized including caps, mask, sterile gowns, sterile  gloves, large sterile drape, hand hygiene, and Betadine prep. The right common femoral artery was interrogated with ultrasound and found to be widely patent. An image was obtained and stored for the medical record. Local anesthesia was attained by infiltration with 1% lidocaine. A small dermatotomy was made. Under real-time sonographic guidance, the vessel was punctured with a 21 gauge micropuncture needle. Using standard technique, the initial micro needle was exchanged over a 0.018 micro wire for a transitional 4 [place] micro sheath. The micro sheath was then exchanged over a 0.035 wire for a 5 French vascular sheath. A C2 cobra catheter was advanced up in over the aortic bifurcation and used to select the left internal iliac artery. A left internal iliac arteriogram was performed. The origin of the prosthetic artery is identified and appears to a rise from a common trunk with the internal pudendal artery and middle rectal artery. Of note, no obturator artery is identified. The left obturator artery may be replaced to the inferior epigastric artery. A Progreat outflow microcatheter was then advanced over a Fathom 16 wire and used to successfully select the prosthetic artery. Digital subtraction angiography demonstrates excellent parenchymal blush of the left hemi prostate. No evidence of collateral filling. Particle embolization was then performed to stasis using 100-300 micron embospheres. Post embolization arteriography demonstrates cessation of flow into the prosthetic artery. The middle rectal and internal pudendal arteries remain patent. The microcatheter was removed. The 5 French C2 cobra catheter was then formed into a Waltman loop in used to select the ipsilateral right external iliac artery. An arteriogram was performed to identify the origin of the internal iliac artery. The internal iliac artery was then selected. Arteriography was performed. The obturator is present on the right side. The origin of the  prosthetic artery is identified arising from a the inferior gluteal artery which in turn arises from a gluteal pudendal trunk. The prosthetic artery was successfully advanced into the gluteal pudendal trunk and arteriography was performed. The origin of the prosthetic artery was successfully identified. The microcatheter was then successfully advanced into the prosthetic artery. Upon the super selective digital subtraction angiography, the prosthetic artery also gives rise to the right-sided middle rectal artery. Arteriography was performed in multiple obliquities identifying that the middle rectal artery comes off fairly proximal on the prosthetic artery. The microcatheter was successfully advanced more distally within the prosthetic artery. Repeat angiography demonstrates excellent parenchymal blush of the prostate gland without evidence of reflux into the middle rectal artery. Particle embolization was then performed using 100-300 micron embospheres. Embolization was taken to near stasis. The microcatheter was pulled back and arteriography was performed confirming continued patency of the middle rectal artery branches. The catheters were removed. Hemostasis was attained with the assistance of a 6 French Angio-Seal device. IMPRESSION: Successful bilateral prosthetic artery embolization. Electronically Signed   By: Jacqulynn Cadet M.D.   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 Interventional Radiology was called in for emergent pelvic arteriogram and prostatic artery embolization due to uncontrollable bleeding in the operating room and transfusion requirement. Patient presents postoperative, intubated and stable for the time being. 3 units packed red blood cells have been transfused and bleeding appears to be tamponaded. We will proceed with emergent embolization. EXAM: IR  ULTRASOUND GUIDANCE VASC ACCESS RIGHT; ADDITIONAL ARTERIOGRAPHY; IR EMBO  ART VEN HEMORR LYMPH EXTRAV INC GUIDE ROADMAPPING; PELVIC SELECTIVE ARTERIOGRAPHY 1. Ultrasound-guided vascular access right common femoral artery 2. Catheterization of the left internal iliac artery with arteriogram 3. Catheterization of the prostatic artery with arteriogram 4. Particle embolization of the prosthetic artery. 5. Catheterization of the right external iliac artery with arteriogram 6. Catheterization of the right internal iliac artery with arteriogram 7. Catheterization of the right gluteal pudendal trunk with arteriogram. 8. Catheterization of the right common trunk of the prosthetic artery and middle rectal artery with arteriogram 9. Catheterization of the right prosthetic artery with arteriogram 10. Coil embolization of the right prosthetic artery MEDICATIONS: None ANESTHESIA/SEDATION: General anesthesia performed by the anesthesiology service CONTRAST:  48mL OMNIPAQUE IOHEXOL 300 MG/ML  SOLN FLUOROSCOPY TIME:  Fluoroscopy Time: 17 minutes 0 seconds (2808 mGy). COMPLICATIONS: None immediate. PROCEDURE: Informed consent was obtained from the patient following explanation of the procedure, risks, benefits and alternatives. The patient understands, agrees and consents for the procedure. All questions were addressed. A time out was performed prior to the initiation of the procedure. Maximal barrier sterile technique utilized including caps, mask, sterile gowns, sterile gloves, large sterile drape, hand hygiene, and Betadine prep. The right common femoral artery was interrogated with ultrasound and found to be widely patent. An image was obtained and stored for the medical record. Local anesthesia was attained by infiltration with 1% lidocaine. A small dermatotomy was made. Under real-time sonographic guidance, the vessel was punctured with a 21 gauge micropuncture needle. Using standard technique, the initial micro needle was  exchanged over a 0.018 micro wire for a transitional 4 [place] micro sheath. The micro sheath was then exchanged over a 0.035 wire for a 5 French vascular sheath. A C2 cobra catheter was advanced up in over the aortic bifurcation and used to select the left internal iliac artery. A left internal iliac arteriogram was performed. The origin of the prosthetic artery is identified and appears to a rise from a common trunk with the internal pudendal artery and middle rectal artery. Of note, no obturator artery is identified. The left obturator artery may be replaced to the inferior epigastric artery. A Progreat outflow microcatheter was then advanced over a Fathom 16 wire and used to successfully select the prosthetic artery. Digital subtraction angiography demonstrates excellent parenchymal blush of the left hemi prostate. No evidence of collateral filling. Particle embolization was then performed to stasis using 100-300 micron embospheres. Post embolization arteriography demonstrates cessation of flow into the prosthetic artery. The middle rectal and internal pudendal arteries remain patent. The microcatheter was removed. The 5 French C2 cobra catheter was then formed into a Waltman loop in used to select the ipsilateral right external iliac artery. An arteriogram was performed to identify the origin of the internal iliac artery. The internal iliac artery was then selected. Arteriography was performed. The obturator is present on the right side. The origin of the prosthetic artery is identified arising from a the inferior gluteal artery which in turn arises from a gluteal pudendal trunk. The prosthetic artery was successfully advanced into the gluteal pudendal trunk and arteriography was performed. The origin of the prosthetic artery was successfully identified. The microcatheter was then successfully advanced into the prosthetic artery. Upon the super selective digital subtraction angiography, the prosthetic artery  also gives rise to the right-sided middle rectal artery. Arteriography was performed in multiple obliquities identifying that the middle rectal artery comes off fairly proximal on the prosthetic artery. The microcatheter was successfully advanced more distally within the  prosthetic artery. Repeat angiography demonstrates excellent parenchymal blush of the prostate gland without evidence of reflux into the middle rectal artery. Particle embolization was then performed using 100-300 micron embospheres. Embolization was taken to near stasis. The microcatheter was pulled back and arteriography was performed confirming continued patency of the middle rectal artery branches. The catheters were removed. Hemostasis was attained with the assistance of a 6 French Angio-Seal device. IMPRESSION: Successful bilateral prosthetic artery embolization. Electronically Signed   By: Jacqulynn Cadet M.D.   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 Interventional Radiology was called in for emergent pelvic arteriogram and prostatic artery embolization due to uncontrollable bleeding in the operating room and transfusion requirement. Patient presents postoperative, intubated and stable for the time being. 3 units packed red blood cells have been transfused and bleeding appears to be tamponaded. We will proceed with emergent embolization. EXAM: IR ULTRASOUND GUIDANCE VASC ACCESS RIGHT; ADDITIONAL ARTERIOGRAPHY; IR EMBO ART VEN HEMORR LYMPH EXTRAV INC GUIDE ROADMAPPING; PELVIC SELECTIVE ARTERIOGRAPHY 1. Ultrasound-guided vascular access right common femoral artery 2. Catheterization of the left internal iliac artery with arteriogram 3. Catheterization of the prostatic artery with arteriogram 4. Particle embolization of the prosthetic artery. 5. Catheterization of the right external iliac artery with arteriogram  6. Catheterization of the right internal iliac artery with arteriogram 7. Catheterization of the right gluteal pudendal trunk with arteriogram. 8. Catheterization of the right common trunk of the prosthetic artery and middle rectal artery with arteriogram 9. Catheterization of the right prosthetic artery with arteriogram 10. Coil embolization of the right prosthetic artery MEDICATIONS: None ANESTHESIA/SEDATION: General anesthesia performed by the anesthesiology service CONTRAST:  43mL OMNIPAQUE IOHEXOL 300 MG/ML  SOLN FLUOROSCOPY TIME:  Fluoroscopy Time: 17 minutes 0 seconds (2808 mGy). COMPLICATIONS: None immediate. PROCEDURE: Informed consent was obtained from the patient following explanation of the procedure, risks, benefits and alternatives. The patient understands, agrees and consents for the procedure. All questions were addressed. A time out was performed prior to the initiation of the procedure. Maximal barrier sterile technique utilized including caps, mask, sterile gowns, sterile gloves, large sterile drape, hand hygiene, and Betadine prep. The right common femoral artery was interrogated with ultrasound and found to be widely patent. An image was obtained and stored for the medical record. Local anesthesia was attained by infiltration with 1% lidocaine. A small dermatotomy was made. Under real-time sonographic guidance, the vessel was punctured with a 21 gauge micropuncture needle. Using standard technique, the initial micro needle was exchanged over a 0.018 micro wire for a transitional 4 [place] micro sheath. The micro sheath was then exchanged over a 0.035 wire for a 5 French vascular sheath. A C2 cobra catheter was advanced up in over the aortic bifurcation and used to select the left internal iliac artery. A left internal iliac arteriogram was performed. The origin of the prosthetic artery is identified and appears to a rise from a common trunk with the internal pudendal artery and middle rectal  artery. Of note, no obturator artery is identified. The left obturator artery may be replaced to the inferior epigastric artery. A Progreat outflow microcatheter was then advanced over a Fathom 16 wire and used to successfully select the prosthetic artery. Digital subtraction angiography demonstrates excellent parenchymal blush of the left hemi prostate. No evidence of collateral filling. Particle embolization was then performed to stasis using 100-300 micron embospheres. Post embolization arteriography demonstrates cessation of flow into  the prosthetic artery. The middle rectal and internal pudendal arteries remain patent. The microcatheter was removed. The 5 French C2 cobra catheter was then formed into a Waltman loop in used to select the ipsilateral right external iliac artery. An arteriogram was performed to identify the origin of the internal iliac artery. The internal iliac artery was then selected. Arteriography was performed. The obturator is present on the right side. The origin of the prosthetic artery is identified arising from a the inferior gluteal artery which in turn arises from a gluteal pudendal trunk. The prosthetic artery was successfully advanced into the gluteal pudendal trunk and arteriography was performed. The origin of the prosthetic artery was successfully identified. The microcatheter was then successfully advanced into the prosthetic artery. Upon the super selective digital subtraction angiography, the prosthetic artery also gives rise to the right-sided middle rectal artery. Arteriography was performed in multiple obliquities identifying that the middle rectal artery comes off fairly proximal on the prosthetic artery. The microcatheter was successfully advanced more distally within the prosthetic artery. Repeat angiography demonstrates excellent parenchymal blush of the prostate gland without evidence of reflux into the middle rectal artery. Particle embolization was then performed  using 100-300 micron embospheres. Embolization was taken to near stasis. The microcatheter was pulled back and arteriography was performed confirming continued patency of the middle rectal artery branches. The catheters were removed. Hemostasis was attained with the assistance of a 6 French Angio-Seal device. IMPRESSION: Successful bilateral prosthetic artery embolization. Electronically Signed   By: Jacqulynn Cadet M.D.   On: 10/03/2019 08:41   IR Angiogram Selective Each Additional Vessel  Result Date: 10/06/2019 INDICATION: 74 year old male with massive benign prosthetic hypertrophy. Interventional Radiology was called in for emergent pelvic arteriogram and prostatic artery embolization due to uncontrollable bleeding in the operating room and transfusion requirement. Patient presents postoperative, intubated and stable for the time being. 3 units packed red blood cells have been transfused and bleeding appears to be tamponaded. We will proceed with emergent embolization. EXAM: IR ULTRASOUND GUIDANCE VASC ACCESS RIGHT; ADDITIONAL ARTERIOGRAPHY; IR EMBO ART VEN HEMORR LYMPH EXTRAV INC GUIDE ROADMAPPING; PELVIC SELECTIVE ARTERIOGRAPHY 1. Ultrasound-guided vascular access right common femoral artery 2. Catheterization of the left internal iliac artery with arteriogram 3. Catheterization of the prostatic artery with arteriogram 4. Particle embolization of the prosthetic artery. 5. Catheterization of the right external iliac artery with arteriogram 6. Catheterization of the right internal iliac artery with arteriogram 7. Catheterization of the right gluteal pudendal trunk with arteriogram. 8. Catheterization of the right common trunk of the prosthetic artery and middle rectal artery with arteriogram 9. Catheterization of the right prosthetic artery with arteriogram 10. Coil embolization of the right prosthetic artery MEDICATIONS: None ANESTHESIA/SEDATION: General anesthesia performed by the anesthesiology service  CONTRAST:  38mL OMNIPAQUE IOHEXOL 300 MG/ML  SOLN FLUOROSCOPY TIME:  Fluoroscopy Time: 17 minutes 0 seconds (2808 mGy). COMPLICATIONS: None immediate. PROCEDURE: Informed consent was obtained from the patient following explanation of the procedure, risks, benefits and alternatives. The patient understands, agrees and consents for the procedure. All questions were addressed. A time out was performed prior to the initiation of the procedure. Maximal barrier sterile technique utilized including caps, mask, sterile gowns, sterile gloves, large sterile drape, hand hygiene, and Betadine prep. The right common femoral artery was interrogated with ultrasound and found to be widely patent. An image was obtained and stored for the medical record. Local anesthesia was attained by infiltration with 1% lidocaine. A small dermatotomy was made. Under real-time sonographic guidance,  the vessel was punctured with a 21 gauge micropuncture needle. Using standard technique, the initial micro needle was exchanged over a 0.018 micro wire for a transitional 4 [place] micro sheath. The micro sheath was then exchanged over a 0.035 wire for a 5 French vascular sheath. A C2 cobra catheter was advanced up in over the aortic bifurcation and used to select the left internal iliac artery. A left internal iliac arteriogram was performed. The origin of the prosthetic artery is identified and appears to a rise from a common trunk with the internal pudendal artery and middle rectal artery. Of note, no obturator artery is identified. The left obturator artery may be replaced to the inferior epigastric artery. A Progreat outflow microcatheter was then advanced over a Fathom 16 wire and used to successfully select the prosthetic artery. Digital subtraction angiography demonstrates excellent parenchymal blush of the left hemi prostate. No evidence of collateral filling. Particle embolization was then performed to stasis using 100-300 micron embospheres.  Post embolization arteriography demonstrates cessation of flow into the prosthetic artery. The middle rectal and internal pudendal arteries remain patent. The microcatheter was removed. The 5 French C2 cobra catheter was then formed into a Waltman loop in used to select the ipsilateral right external iliac artery. An arteriogram was performed to identify the origin of the internal iliac artery. The internal iliac artery was then selected. Arteriography was performed. The obturator is present on the right side. The origin of the prosthetic artery is identified arising from a the inferior gluteal artery which in turn arises from a gluteal pudendal trunk. The prosthetic artery was successfully advanced into the gluteal pudendal trunk and arteriography was performed. The origin of the prosthetic artery was successfully identified. The microcatheter was then successfully advanced into the prosthetic artery. Upon the super selective digital subtraction angiography, the prosthetic artery also gives rise to the right-sided middle rectal artery. Arteriography was performed in multiple obliquities identifying that the middle rectal artery comes off fairly proximal on the prosthetic artery. The microcatheter was successfully advanced more distally within the prosthetic artery. Repeat angiography demonstrates excellent parenchymal blush of the prostate gland without evidence of reflux into the middle rectal artery. Particle embolization was then performed using 100-300 micron embospheres. Embolization was taken to near stasis. The microcatheter was pulled back and arteriography was performed confirming continued patency of the middle rectal artery branches. The catheters were removed. Hemostasis was attained with the assistance of a 6 French Angio-Seal device. IMPRESSION: Successful bilateral prosthetic artery embolization. Electronically Signed   By: Jacqulynn Cadet M.D.   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 Interventional Radiology was called in for emergent pelvic arteriogram and prostatic artery embolization due to uncontrollable bleeding in the operating room and transfusion requirement. Patient presents postoperative, intubated and stable for the time being. 3 units packed red blood cells have been transfused and bleeding appears to be tamponaded. We will proceed with emergent embolization. EXAM: IR ULTRASOUND GUIDANCE VASC ACCESS RIGHT; ADDITIONAL ARTERIOGRAPHY; IR EMBO ART VEN HEMORR LYMPH EXTRAV INC GUIDE ROADMAPPING; PELVIC SELECTIVE ARTERIOGRAPHY 1. Ultrasound-guided vascular access right common femoral artery 2. Catheterization of the left internal iliac artery with arteriogram 3. Catheterization of the prostatic artery with arteriogram 4. Particle embolization of the prosthetic artery. 5. Catheterization of the right external iliac artery with arteriogram 6. Catheterization of the right internal iliac artery with arteriogram 7. Catheterization of the right gluteal  pudendal trunk with arteriogram. 8. Catheterization of the right common trunk of the prosthetic artery and middle rectal artery with arteriogram 9. Catheterization of the right prosthetic artery with arteriogram 10. Coil embolization of the right prosthetic artery MEDICATIONS: None ANESTHESIA/SEDATION: General anesthesia performed by the anesthesiology service CONTRAST:  36mL OMNIPAQUE IOHEXOL 300 MG/ML  SOLN FLUOROSCOPY TIME:  Fluoroscopy Time: 17 minutes 0 seconds (2808 mGy). COMPLICATIONS: None immediate. PROCEDURE: Informed consent was obtained from the patient following explanation of the procedure, risks, benefits and alternatives. The patient understands, agrees and consents for the procedure. All questions were addressed. A time out was performed prior to the initiation of the procedure. Maximal barrier sterile technique  utilized including caps, mask, sterile gowns, sterile gloves, large sterile drape, hand hygiene, and Betadine prep. The right common femoral artery was interrogated with ultrasound and found to be widely patent. An image was obtained and stored for the medical record. Local anesthesia was attained by infiltration with 1% lidocaine. A small dermatotomy was made. Under real-time sonographic guidance, the vessel was punctured with a 21 gauge micropuncture needle. Using standard technique, the initial micro needle was exchanged over a 0.018 micro wire for a transitional 4 [place] micro sheath. The micro sheath was then exchanged over a 0.035 wire for a 5 French vascular sheath. A C2 cobra catheter was advanced up in over the aortic bifurcation and used to select the left internal iliac artery. A left internal iliac arteriogram was performed. The origin of the prosthetic artery is identified and appears to a rise from a common trunk with the internal pudendal artery and middle rectal artery. Of note, no obturator artery is identified. The left obturator artery may be replaced to the inferior epigastric artery. A Progreat outflow microcatheter was then advanced over a Fathom 16 wire and used to successfully select the prosthetic artery. Digital subtraction angiography demonstrates excellent parenchymal blush of the left hemi prostate. No evidence of collateral filling. Particle embolization was then performed to stasis using 100-300 micron embospheres. Post embolization arteriography demonstrates cessation of flow into the prosthetic artery. The middle rectal and internal pudendal arteries remain patent. The microcatheter was removed. The 5 French C2 cobra catheter was then formed into a Waltman loop in used to select the ipsilateral right external iliac artery. An arteriogram was performed to identify the origin of the internal iliac artery. The internal iliac artery was then selected. Arteriography was performed. The  obturator is present on the right side. The origin of the prosthetic artery is identified arising from a the inferior gluteal artery which in turn arises from a gluteal pudendal trunk. The prosthetic artery was successfully advanced into the gluteal pudendal trunk and arteriography was performed. The origin of the prosthetic artery was successfully identified. The microcatheter was then successfully advanced into the prosthetic artery. Upon the super selective digital subtraction angiography, the prosthetic artery also gives rise to the right-sided middle rectal artery. Arteriography was performed in multiple obliquities identifying that the middle rectal artery comes off fairly proximal on the prosthetic artery. The microcatheter was successfully advanced more distally within the prosthetic artery. Repeat angiography demonstrates excellent parenchymal blush of the prostate gland without evidence of reflux into the middle rectal artery. Particle embolization was then performed using 100-300 micron embospheres. Embolization was taken to near stasis. The microcatheter was pulled back and arteriography was performed confirming continued patency of the middle rectal artery branches. The catheters were removed. Hemostasis was attained with the assistance of a 6 French Angio-Seal device.  IMPRESSION: Successful bilateral prosthetic artery embolization. Electronically Signed   By: Jacqulynn Cadet M.D.   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 Interventional Radiology was called in for emergent pelvic arteriogram and prostatic artery embolization due to uncontrollable bleeding in the operating room and transfusion requirement. Patient presents postoperative, intubated and stable for the time being. 3 units packed red blood cells have been transfused and bleeding appears to be tamponaded. We  will proceed with emergent embolization. EXAM: IR ULTRASOUND GUIDANCE VASC ACCESS RIGHT; ADDITIONAL ARTERIOGRAPHY; IR EMBO ART VEN HEMORR LYMPH EXTRAV INC GUIDE ROADMAPPING; PELVIC SELECTIVE ARTERIOGRAPHY 1. Ultrasound-guided vascular access right common femoral artery 2. Catheterization of the left internal iliac artery with arteriogram 3. Catheterization of the prostatic artery with arteriogram 4. Particle embolization of the prosthetic artery. 5. Catheterization of the right external iliac artery with arteriogram 6. Catheterization of the right internal iliac artery with arteriogram 7. Catheterization of the right gluteal pudendal trunk with arteriogram. 8. Catheterization of the right common trunk of the prosthetic artery and middle rectal artery with arteriogram 9. Catheterization of the right prosthetic artery with arteriogram 10. Coil embolization of the right prosthetic artery MEDICATIONS: None ANESTHESIA/SEDATION: General anesthesia performed by the anesthesiology service CONTRAST:  20mL OMNIPAQUE IOHEXOL 300 MG/ML  SOLN FLUOROSCOPY TIME:  Fluoroscopy Time: 17 minutes 0 seconds (2808 mGy). COMPLICATIONS: None immediate. PROCEDURE: Informed consent was obtained from the patient following explanation of the procedure, risks, benefits and alternatives. The patient understands, agrees and consents for the procedure. All questions were addressed. A time out was performed prior to the initiation of the procedure. Maximal barrier sterile technique utilized including caps, mask, sterile gowns, sterile gloves, large sterile drape, hand hygiene, and Betadine prep. The right common femoral artery was interrogated with ultrasound and found to be widely patent. An image was obtained and stored for the medical record. Local anesthesia was attained by infiltration with 1% lidocaine. A small dermatotomy was made. Under real-time sonographic guidance, the vessel was punctured with a 21 gauge micropuncture needle. Using  standard technique, the initial micro needle was exchanged over a 0.018 micro wire for a transitional 4 [place] micro sheath. The micro sheath was then exchanged over a 0.035 wire for a 5 French vascular sheath. A C2 cobra catheter was advanced up in over the aortic bifurcation and used to select the left internal iliac artery. A left internal iliac arteriogram was performed. The origin of the prosthetic artery is identified and appears to a rise from a common trunk with the internal pudendal artery and middle rectal artery. Of note, no obturator artery is identified. The left obturator artery may be replaced to the inferior epigastric artery. A Progreat outflow microcatheter was then advanced over a Fathom 16 wire and used to successfully select the prosthetic artery. Digital subtraction angiography demonstrates excellent parenchymal blush of the left hemi prostate. No evidence of collateral filling. Particle embolization was then performed to stasis using 100-300 micron embospheres. Post embolization arteriography demonstrates cessation of flow into the prosthetic artery. The middle rectal and internal pudendal arteries remain patent. The microcatheter was removed. The 5 French C2 cobra catheter was then formed into a Waltman loop in used to select the ipsilateral right external iliac artery. An arteriogram was performed to identify the origin of the internal iliac artery. The internal iliac artery was then selected. Arteriography was performed. The obturator is present on the right side.  The origin of the prosthetic artery is identified arising from a the inferior gluteal artery which in turn arises from a gluteal pudendal trunk. The prosthetic artery was successfully advanced into the gluteal pudendal trunk and arteriography was performed. The origin of the prosthetic artery was successfully identified. The microcatheter was then successfully advanced into the prosthetic artery. Upon the super selective digital  subtraction angiography, the prosthetic artery also gives rise to the right-sided middle rectal artery. Arteriography was performed in multiple obliquities identifying that the middle rectal artery comes off fairly proximal on the prosthetic artery. The microcatheter was successfully advanced more distally within the prosthetic artery. Repeat angiography demonstrates excellent parenchymal blush of the prostate gland without evidence of reflux into the middle rectal artery. Particle embolization was then performed using 100-300 micron embospheres. Embolization was taken to near stasis. The microcatheter was pulled back and arteriography was performed confirming continued patency of the middle rectal artery branches. The catheters were removed. Hemostasis was attained with the assistance of a 6 French Angio-Seal device. IMPRESSION: Successful bilateral prosthetic artery embolization. Electronically Signed   By: Jacqulynn Cadet M.D.   On: 10/03/2019 08:41   DG Cystogram  Result Date: 10/03/2019 CLINICAL DATA:  Intraoperative imaging for marked benign prostatic hyperplasia and urinary retention. EXAM: CYSTOGRAM TECHNIQUE: Single image from an attempted intraoperative retrograde cystourethrogram. FLUOROSCOPY TIME:  Fluoroscopy Time:  22 seconds Radiation Exposure Index (if provided by the fluoroscopic device): Not applicable. Number of Acquired Spot Images: 0 COMPARISON:  Operative note of same date. FINDINGS: A single image from an attempted retrograde cystourethrogram was submitted. This is limited, secondary to positioning. This demonstrates a stricture within the mid (likely prostatic) urethra. The more proximal urethra is dilated. Only minimal contrast is identified presumably within the urinary bladder. Lymphovascular extravasation of contrast identified. IMPRESSION: Limited, attempted cystourethrogram as detailed above. 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 Lack of significant opacification of  the urinary bladder. Electronically Signed   By: Abigail Miyamoto M.D.   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 Interventional Radiology was called in for emergent pelvic arteriogram and prostatic artery embolization due to uncontrollable bleeding in the operating room and transfusion requirement. Patient presents postoperative, intubated and stable for the time being. 3 units packed red blood cells have been transfused and bleeding appears to be tamponaded. We will proceed with emergent embolization. EXAM: IR ULTRASOUND GUIDANCE VASC ACCESS RIGHT; ADDITIONAL ARTERIOGRAPHY; IR EMBO ART VEN HEMORR LYMPH EXTRAV INC GUIDE ROADMAPPING; PELVIC SELECTIVE ARTERIOGRAPHY 1. Ultrasound-guided vascular access right common femoral artery 2. Catheterization of the left internal iliac artery with arteriogram 3. Catheterization of the prostatic artery with arteriogram 4. Particle embolization of the prosthetic artery. 5. Catheterization of the right external iliac artery with arteriogram 6. Catheterization of the right internal iliac artery with arteriogram 7. Catheterization of the right gluteal pudendal trunk with arteriogram. 8. Catheterization of the right common trunk of the prosthetic artery and middle rectal artery with arteriogram 9. Catheterization of the right prosthetic artery with arteriogram 10. Coil embolization of the right prosthetic artery MEDICATIONS: None ANESTHESIA/SEDATION: General anesthesia performed by the anesthesiology service CONTRAST:  75mL OMNIPAQUE IOHEXOL 300 MG/ML  SOLN FLUOROSCOPY TIME:  Fluoroscopy Time: 17 minutes 0 seconds (2808 mGy). COMPLICATIONS: None immediate. PROCEDURE: Informed consent was obtained from the patient following explanation of the procedure, risks, benefits and alternatives. The patient understands, agrees and consents for the procedure. All  questions were  addressed. A time out was performed prior to the initiation of the procedure. Maximal barrier sterile technique utilized including caps, mask, sterile gowns, sterile gloves, large sterile drape, hand hygiene, and Betadine prep. The right common femoral artery was interrogated with ultrasound and found to be widely patent. An image was obtained and stored for the medical record. Local anesthesia was attained by infiltration with 1% lidocaine. A small dermatotomy was made. Under real-time sonographic guidance, the vessel was punctured with a 21 gauge micropuncture needle. Using standard technique, the initial micro needle was exchanged over a 0.018 micro wire for a transitional 4 [place] micro sheath. The micro sheath was then exchanged over a 0.035 wire for a 5 French vascular sheath. A C2 cobra catheter was advanced up in over the aortic bifurcation and used to select the left internal iliac artery. A left internal iliac arteriogram was performed. The origin of the prosthetic artery is identified and appears to a rise from a common trunk with the internal pudendal artery and middle rectal artery. Of note, no obturator artery is identified. The left obturator artery may be replaced to the inferior epigastric artery. A Progreat outflow microcatheter was then advanced over a Fathom 16 wire and used to successfully select the prosthetic artery. Digital subtraction angiography demonstrates excellent parenchymal blush of the left hemi prostate. No evidence of collateral filling. Particle embolization was then performed to stasis using 100-300 micron embospheres. Post embolization arteriography demonstrates cessation of flow into the prosthetic artery. The middle rectal and internal pudendal arteries remain patent. The microcatheter was removed. The 5 French C2 cobra catheter was then formed into a Waltman loop in used to select the ipsilateral right external iliac artery. An arteriogram was performed to identify the origin  of the internal iliac artery. The internal iliac artery was then selected. Arteriography was performed. The obturator is present on the right side. The origin of the prosthetic artery is identified arising from a the inferior gluteal artery which in turn arises from a gluteal pudendal trunk. The prosthetic artery was successfully advanced into the gluteal pudendal trunk and arteriography was performed. The origin of the prosthetic artery was successfully identified. The microcatheter was then successfully advanced into the prosthetic artery. Upon the super selective digital subtraction angiography, the prosthetic artery also gives rise to the right-sided middle rectal artery. Arteriography was performed in multiple obliquities identifying that the middle rectal artery comes off fairly proximal on the prosthetic artery. The microcatheter was successfully advanced more distally within the prosthetic artery. Repeat angiography demonstrates excellent parenchymal blush of the prostate gland without evidence of reflux into the middle rectal artery. Particle embolization was then performed using 100-300 micron embospheres. Embolization was taken to near stasis. The microcatheter was pulled back and arteriography was performed confirming continued patency of the middle rectal artery branches. The catheters were removed. Hemostasis was attained with the assistance of a 6 French Angio-Seal device. IMPRESSION: Successful bilateral prosthetic artery embolization. Electronically Signed   By: Jacqulynn Cadet M.D.   On: 10/03/2019 08:41   DG Chest Port 1 View  Result Date: 10/06/2019 CLINICAL DATA:  Sepsis EXAM: PORTABLE CHEST 1 VIEW COMPARISON:  10/03/2019 FINDINGS: There is scattered hazy airspace opacities bilaterally which have developed since the prior study. These are most evident at the lung bases. The lung volumes are slightly low. There is no pneumothorax. There may be a small left-sided pleural effusion. The heart  size is unremarkable. IMPRESSION: New bibasilar airspace opacities concerning for multifocal  pneumonia in the appropriate setting. Electronically Signed   By: Constance Holster M.D.   On: 10/06/2019 16:36   DG Chest Port 1 View  Result Date: 10/03/2019 CLINICAL DATA:  Intubation EXAM: PORTABLE CHEST 1 VIEW COMPARISON:  None. FINDINGS: Endotracheal tube with tip halfway between the clavicular heads and carina, 2.5 cm above the carina. The enteric tube and thermistor reaches the stomach. Generous heart size likely from low volumes. There is no edema, consolidation, effusion, or pneumothorax. IMPRESSION: Unremarkable hardware positioning and low volume but clear lungs. Electronically Signed   By: Monte Fantasia M.D.   On: 10/03/2019 05:41   DG Abd Portable 1V  Result Date: 10/03/2019 CLINICAL DATA:  NG tube placement EXAM: PORTABLE ABDOMEN - 1 VIEW COMPARISON:  None. FINDINGS: Enteric tube terminates within the stomach. Included bowel gas pattern is unremarkable. IMPRESSION: Enteric tube within the stomach. Electronically Signed   By: Macy Mis M.D.   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 Interventional Radiology was called in for emergent pelvic arteriogram and prostatic artery embolization due to uncontrollable bleeding in the operating room and transfusion requirement. Patient presents postoperative, intubated and stable for the time being. 3 units packed red blood cells have been transfused and bleeding appears to be tamponaded. We will proceed with emergent embolization. EXAM: IR ULTRASOUND GUIDANCE VASC ACCESS RIGHT; ADDITIONAL ARTERIOGRAPHY; IR EMBO ART VEN HEMORR LYMPH EXTRAV INC GUIDE ROADMAPPING; PELVIC SELECTIVE ARTERIOGRAPHY 1. Ultrasound-guided vascular access right common femoral artery 2. Catheterization of the left internal iliac artery with  arteriogram 3. Catheterization of the prostatic artery with arteriogram 4. Particle embolization of the prosthetic artery. 5. Catheterization of the right external iliac artery with arteriogram 6. Catheterization of the right internal iliac artery with arteriogram 7. Catheterization of the right gluteal pudendal trunk with arteriogram. 8. Catheterization of the right common trunk of the prosthetic artery and middle rectal artery with arteriogram 9. Catheterization of the right prosthetic artery with arteriogram 10. Coil embolization of the right prosthetic artery MEDICATIONS: None ANESTHESIA/SEDATION: General anesthesia performed by the anesthesiology service CONTRAST:  57mL OMNIPAQUE IOHEXOL 300 MG/ML  SOLN FLUOROSCOPY TIME:  Fluoroscopy Time: 17 minutes 0 seconds (2808 mGy). COMPLICATIONS: None immediate. PROCEDURE: Informed consent was obtained from the patient following explanation of the procedure, risks, benefits and alternatives. The patient understands, agrees and consents for the procedure. All questions were addressed. A time out was performed prior to the initiation of the procedure. Maximal barrier sterile technique utilized including caps, mask, sterile gowns, sterile gloves, large sterile drape, hand hygiene, and Betadine prep. The right common femoral artery was interrogated with ultrasound and found to be widely patent. An image was obtained and stored for the medical record. Local anesthesia was attained by infiltration with 1% lidocaine. A small dermatotomy was made. Under real-time sonographic guidance, the vessel was punctured with a 21 gauge micropuncture needle. Using standard technique, the initial micro needle was exchanged over a 0.018 micro wire for a transitional 4 [place] micro sheath. The micro sheath was then exchanged over a 0.035 wire for a 5 French vascular sheath. A C2 cobra catheter was advanced up in over the aortic bifurcation and used to select the left internal iliac artery. A  left internal iliac arteriogram was performed. The origin of the prosthetic artery is identified and appears to a rise from a common trunk with the internal pudendal  artery and middle rectal artery. Of note, no obturator artery is identified. The left obturator artery may be replaced to the inferior epigastric artery. A Progreat outflow microcatheter was then advanced over a Fathom 16 wire and used to successfully select the prosthetic artery. Digital subtraction angiography demonstrates excellent parenchymal blush of the left hemi prostate. No evidence of collateral filling. Particle embolization was then performed to stasis using 100-300 micron embospheres. Post embolization arteriography demonstrates cessation of flow into the prosthetic artery. The middle rectal and internal pudendal arteries remain patent. The microcatheter was removed. The 5 French C2 cobra catheter was then formed into a Waltman loop in used to select the ipsilateral right external iliac artery. An arteriogram was performed to identify the origin of the internal iliac artery. The internal iliac artery was then selected. Arteriography was performed. The obturator is present on the right side. The origin of the prosthetic artery is identified arising from a the inferior gluteal artery which in turn arises from a gluteal pudendal trunk. The prosthetic artery was successfully advanced into the gluteal pudendal trunk and arteriography was performed. The origin of the prosthetic artery was successfully identified. The microcatheter was then successfully advanced into the prosthetic artery. Upon the super selective digital subtraction angiography, the prosthetic artery also gives rise to the right-sided middle rectal artery. Arteriography was performed in multiple obliquities identifying that the middle rectal artery comes off fairly proximal on the prosthetic artery. The microcatheter was successfully advanced more distally within the prosthetic  artery. Repeat angiography demonstrates excellent parenchymal blush of the prostate gland without evidence of reflux into the middle rectal artery. Particle embolization was then performed using 100-300 micron embospheres. Embolization was taken to near stasis. The microcatheter was pulled back and arteriography was performed confirming continued patency of the middle rectal artery branches. The catheters were removed. Hemostasis was attained with the assistance of a 6 French Angio-Seal device. IMPRESSION: Successful bilateral prosthetic artery embolization. Electronically Signed   By: Jacqulynn Cadet M.D.   On: 10/03/2019 08:41   CT IMAGE GUIDED DRAINAGE BY PERCUTANEOUS CATHETER  Result Date: 09/27/2019 INDICATION: 74 year old male with massive prostatomegaly and bladder outlet obstruction. He has a history of chronic indwelling suprapubic bladder catheter, however this was inadvertently displaced earlier today. Unsuccessful attempted replacement by Urology at the bedside. The skin entry site appears to have healed over. He requires urgent placement of a new suprapubic bladder catheter. EXAM: CT IMAGE GUIDED DRAINAGE BY PERCUTANEOUS CATHETER COMPARISON:  None. MEDICATIONS: None. ANESTHESIA/SEDATION: Fentanyl 100 mcg IV; Versed 2 mg IV Moderate Sedation Time:  12 The patient was continuously monitored during the procedure by the interventional radiology nurse under my direct supervision. CONTRAST:  None FLUOROSCOPY TIME:  None COMPLICATIONS: None immediate. PROCEDURE: Informed written consent was obtained from the patient after a thorough discussion of the procedural risks, benefits and alternatives. All questions were addressed. Maximal Sterile Barrier Technique was utilized including caps, mask, sterile gowns, sterile gloves, sterile drape, hand hygiene and skin antiseptic. A timeout was performed prior to the initiation of the procedure. A planning axial CT scan was performed. The bladder is markedly  distended. A suitable skin entry site in the midline just inferior to the umbilicus was selected and marked. The overlying skin was sterilely prepped and draped in the standard fashion using chlorhexidine skin prep. Local anesthesia was attained by infiltration with 1% lidocaine. A small dermatotomy was made. Under intermittent CT guidance, an 18 gauge trocar needle was advanced through the lower anterior abdominal wall  and into the bladder. A 0.035 wire was then coiled in the bladder. The skin tract was dilated to 56 [place] and a Cook 14 [place] all-purpose drainage catheter was advanced over the wire and formed in the bladder. The catheter was connected to a urinary leg bag and secured to the skin with 0 Prolene suture. Sterile bandages were applied. Follow-up CT imaging demonstrates a well-positioned suprapubic catheter and no evidence of immediate complication. IMPRESSION: Successful placement of a 14 French suprapubic bladder catheter. PLAN: 1. Return to interventional Radiology in 4-6 weeks for catheter exchange and up size. 2. Consider referral to Interventional Radiology to evaluate for possible prosthetic artery embolization. This procedure could improve the patient's underlying bladder outlet obstruction and removed the need for chronic catheter dependence. Signed, Criselda Peaches, MD, [facility] Radiology Electronically Signed   By: Jacqulynn Cadet M.D.   On: 09/27/2019 09:25   VAS [place] LOWER EXTREMITY VENOUS (DVT)  Result Date: 10/03/2019  Lower Venous DVTStudy Indications: Edema.  Comparison Study: No prior study Performing Technologist: Maudry Mayhew MHA, RDMS, RVT, RDCS  Examination Guidelines: A complete evaluation includes B-mode imaging, spectral Doppler, color Doppler, and power Doppler as needed of all accessible portions of each vessel. Bilateral testing is considered an integral part of a complete examination. Limited  examinations for reoccurring indications may be performed as noted. The reflux portion of the exam is performed with the patient in reverse Trendelenburg.  +---------+---------------+---------+-----------+----------+--------------+ RIGHT    CompressibilityPhasicitySpontaneityPropertiesThrombus Aging +---------+---------------+---------+-----------+----------+--------------+ CFV      Full           Yes      Yes                                 +---------+---------------+---------+-----------+----------+--------------+ SFJ      Full                                                        +---------+---------------+---------+-----------+----------+--------------+ FV Prox  Full                                                        +---------+---------------+---------+-----------+----------+--------------+ FV Mid   Full                                                        +---------+---------------+---------+-----------+----------+--------------+ FV DistalFull                                                        +---------+---------------+---------+-----------+----------+--------------+ PFV      Full                                                        +---------+---------------+---------+-----------+----------+--------------+  POP      Full           Yes      Yes                                 +---------+---------------+---------+-----------+----------+--------------+ PTV      Full                    Yes                                 +---------+---------------+---------+-----------+----------+--------------+   Right Technical Findings: Not visualized segments include peroneal veins.  +---------+---------------+---------+-----------+----------+--------------+ LEFT     CompressibilityPhasicitySpontaneityPropertiesThrombus Aging +---------+---------------+---------+-----------+----------+--------------+ CFV      Full           Yes      Yes                                  +---------+---------------+---------+-----------+----------+--------------+ SFJ      Full                                                        +---------+---------------+---------+-----------+----------+--------------+ FV Prox  Full                                                        +---------+---------------+---------+-----------+----------+--------------+ FV Mid   Full                                                        +---------+---------------+---------+-----------+----------+--------------+ FV DistalFull                                                        +---------+---------------+---------+-----------+----------+--------------+ PFV      Full                                                        +---------+---------------+---------+-----------+----------+--------------+ POP      Full           Yes      Yes                                 +---------+---------------+---------+-----------+----------+--------------+ PTV      Full                    Yes                                 +---------+---------------+---------+-----------+----------+--------------+  Left Technical Findings: Not visualized segments include peroneal veins.   Summary: RIGHT: - There is no evidence of deep vein thrombosis in the lower extremity. However, portions of this examination were limited- see technologist comments above.  - No cystic structure found in the popliteal fossa.  LEFT: - There is no evidence of deep vein thrombosis in the lower extremity. However, portions of this examination were limited- see technologist comments above.  - No cystic structure found in the popliteal fossa.  *See table(s) above for measurements and observations. Electronically signed by Monica Martinez MD on 10/03/2019 at 2:02:39 PM.    Final      Subjective: Eager to go home  Discharge Exam: Vitals:   10/07/19 2002 10/08/19 0418  BP: (!) 141/82 133/77   Pulse: 93 74  Resp: 16 16  Temp: 99.7 F (37.6 C) 98.2 F (36.8 C)  SpO2: 100% 98%   Vitals:   10/07/19 1028 10/07/19 2002 10/08/19 0418 10/08/19 0500  BP: 124/79 (!) 141/82 133/77   Pulse: 94 93 74   Resp: 18 16 16    Temp: 99.6 F (37.6 C) 99.7 F (37.6 C) 98.2 F (36.8 C)   TempSrc: Oral Oral Oral   SpO2: 100% 100% 98%   Weight:    91 kg  Height:        General: Pt is alert, awake, not in acute distress Cardiovascular: RRR, S1/S2 +, no rubs, no gallops Respiratory: CTA bilaterally, no wheezing, no rhonchi Abdominal: Soft, NT, ND, bowel sounds + Extremities: no edema, no cyanosis   The results of significant diagnostics from this hospitalization (including imaging, microbiology, ancillary and laboratory) are listed below for reference.     Microbiology: Recent Results (from the past 240 hour(s))  Respiratory Panel by RT PCR (Flu A&B, Covid) - Nasopharyngeal Swab     Status: None   Collection Time: 10/02/19 12:08 PM   Specimen: Nasopharyngeal Swab  Result Value Ref Range Status   SARS Coronavirus 2 by RT PCR NEGATIVE NEGATIVE Final    Comment: (NOTE) SARS-CoV-2 target nucleic acids are NOT DETECTED. The SARS-CoV-2 RNA is generally detectable in upper respiratoy specimens during the acute phase of infection. The lowest concentration of SARS-CoV-2 viral copies this assay can detect is 131 copies/mL. A negative result does not preclude SARS-Cov-2 infection and should not be used as the sole basis for treatment or other patient management decisions. A negative result may occur with  improper specimen collection/handling, submission of specimen other than nasopharyngeal swab, presence of viral mutation(s) within the areas targeted by this assay, and inadequate number of viral copies (<131 copies/mL). A negative result must be combined with clinical observations, patient history, and epidemiological information. The expected result is Negative. Fact Sheet for  Patients:  PinkCheek.be Fact Sheet for Healthcare Providers:  GravelBags.it This test is not yet ap proved or cleared by the [place] FDA and  has been authorized for detection and/or diagnosis of SARS-CoV-2 by FDA under an Emergency Use Authorization (EUA). This EUA will remain  in effect (meaning this test can be used) for the duration of the COVID-19 declaration under Section 564(b)(1) of the Act, 21 U.S.C. section 360bbb-3(b)(1), unless the authorization is terminated or revoked sooner.    Influenza A by PCR NEGATIVE NEGATIVE Final   Influenza B by PCR NEGATIVE NEGATIVE Final    Comment: (NOTE) The Xpert Xpress SARS-CoV-2/FLU/RSV assay is intended as an aid in  the diagnosis of influenza from Nasopharyngeal swab specimens and  should not be  used as a sole basis for treatment. Nasal washings and  aspirates are unacceptable for Xpert Xpress SARS-CoV-2/FLU/RSV  testing. Fact Sheet for Patients: PinkCheek.be Fact Sheet for Healthcare Providers: GravelBags.it This test is not yet approved or cleared by the [place] FDA and  has been authorized for detection and/or diagnosis of SARS-CoV-2 by  FDA under an Emergency Use Authorization (EUA). This EUA will remain  in effect (meaning this test can be used) for the duration of the  Covid-19 declaration under Section 564(b)(1) of the Act, 21  U.S.C. section 360bbb-3(b)(1), unless the authorization is  terminated or revoked. Performed at [facility], [place] [address] [place], [place] [address]   Culture, blood (routine x 2)     Status: None (Preliminary result)   Collection Time: 10/06/19  8:14 AM   Specimen: BLOOD  Result Value Ref Range Status   Specimen Description BLOOD RIGHT ANTECUBITAL  Final   Special Requests   Final    BOTTLES DRAWN AEROBIC AND ANAEROBIC Blood Culture adequate volume   Culture    Final    NO GROWTH 2 DAYS Performed at [facility], [place] [address] [place], [place] [address]    Report Status PENDING  Incomplete  Culture, blood (routine x 2)     Status: None (Preliminary result)   Collection Time: 10/06/19  8:16 AM   Specimen: BLOOD LEFT ARM  Result Value Ref Range Status   Specimen Description BLOOD LEFT ARM  Final   Special Requests   Final    BOTTLES DRAWN AEROBIC AND ANAEROBIC Blood Culture adequate volume   Culture   Final    NO GROWTH 2 DAYS Performed at [facility], [place] [address], [place], [place] [address]    Report Status PENDING  Incomplete  Culture, Urine     Status: Abnormal (Preliminary result)   Collection Time: 10/06/19  8:37 AM   Specimen: Urine, Random  Result Value Ref Range Status   Specimen Description URINE, RANDOM  Final   Special Requests   Final    NONE Performed at [facility], [place] [address], [place], [place] [address]    Culture 10,000 COLONIES/mL STAPHYLOCOCCUS EPIDERMIDIS (A)  Final   Report Status PENDING  Incomplete     Labs: BNP (last 3 results) No results for input(s): BNP in the last 8760 hours. Basic Metabolic Panel: Recent Labs  Lab 10/02/19 1236 10/02/19 2335 10/03/19 2200 10/05/19 0252 10/05/19 0649 10/06/19 0232 10/06/19 1815 10/07/19 0243 10/08/19 0300  NA  --    < > 136 140  --  139  --  140 136  K  --    < > 4.1 3.8  --  4.3  --  4.0 4.3  CL  --    < > 105 106  --  105  --  107 104  CO2  --    < > 23 25  --  28  --  23 20*  GLUCOSE  --    < > 162* 105*  --  111*  --  98 94  BUN  --    < > 25* 22  --  16  --  18 17  CREATININE  --    < > 1.27* 1.11  --  1.09  --  0.91 0.98  CALCIUM  --    < > 7.9* 7.9*  --  7.7*  --  7.4* 7.5*  MG 1.9  --  1.9  --  1.9  --  1.8  --   --   PHOS 5.0*  --   --   --   --   --   --   --   --    < > = values in this interval not displayed.   Liver Function Tests: Recent Labs  Lab 10/02/19 0953 10/03/19 0502  AST 46* 29  ALT 35 24  ALKPHOS 95  58  BILITOT 0.8 1.4*  PROT 6.5 4.7*  ALBUMIN 3.1* 2.2*   No results for input(s): LIPASE, AMYLASE in the last 168 hours. No results for input(s): AMMONIA in the last 168 hours. CBC: Recent Labs  Lab 10/02/19 0952 10/02/19 2335 10/03/19 0502 10/03/19 0558 10/04/19 1030 10/05/19 0252 10/06/19 0232 10/07/19 0243 10/08/19 0300  WBC 13.2*  --  8.3  --   --  13.7* 19.8* 13.1* 10.5  NEUTROABS 10.3*  --   --   --   --   --   --   --   --   HGB 7.1*   < > 9.2*   < > 8.5* 8.2* 8.1* 7.4* 7.9*  HCT 24.6*   < > 28.1*   < > 27.2* 26.1* 26.6* 24.3* 25.2*  MCV 70.5*  --  78.7*  --   --  82.6 83.1 83.8 83.7  PLT 361  --  161  160  --   --  184 221 216 283   < > = values in this interval not displayed.   Cardiac Enzymes: No results for input(s): CKTOTAL, CKMB, CKMBINDEX, TROPONINI in the last 168 hours. BNP: Invalid input(s): POCBNP CBG: Recent Labs  Lab 10/07/19 0808 10/07/19 1151 10/07/19 1605 10/07/19 2000 10/07/19 2352  GLUCAP 77 77 101* 99 99   D-Dimer No results for input(s): DDIMER in the last 72 hours. Hgb A1c No results for input(s): HGBA1C in the last 72 hours. Lipid Profile No results for input(s): CHOL, HDL, LDLCALC, TRIG, CHOLHDL, LDLDIRECT in the last 72 hours. Thyroid function studies No results for input(s): TSH, T4TOTAL, T3FREE, THYROIDAB in the last 72 hours.  Invalid input(s): FREET3 Anemia work up No results for input(s): VITAMINB12, FOLATE, FERRITIN, TIBC, IRON, RETICCTPCT in the last 72 hours. Urinalysis    Component Value Date/Time   COLORURINE STRAW (A) 10/06/2019 0859   APPEARANCEUR CLEAR 10/06/2019 0859   LABSPEC 1.005 10/06/2019 0859   PHURINE 5.0 10/06/2019 0859   GLUCOSEU NEGATIVE 10/06/2019 0859   HGBUR LARGE (A) 10/06/2019 0859   BILIRUBINUR NEGATIVE 10/06/2019 0859   KETONESUR NEGATIVE 10/06/2019 0859   PROTEINUR 30 (A) 10/06/2019 0859   UROBILINOGEN 0.2 12/27/2012 2241   NITRITE NEGATIVE 10/06/2019 0859   LEUKOCYTESUR MODERATE (A)  10/06/2019 0859   Sepsis Labs Invalid input(s): PROCALCITONIN,  WBC,  LACTICIDVEN Microbiology Recent Results (from the past 240 hour(s))  Respiratory Panel by RT PCR (Flu A&B, Covid) - Nasopharyngeal Swab     Status: None   Collection Time: 10/02/19 12:08 PM   Specimen: Nasopharyngeal Swab  Result Value Ref Range Status   SARS Coronavirus 2 by RT PCR NEGATIVE NEGATIVE Final    Comment: (NOTE) SARS-CoV-2 target nucleic acids are NOT DETECTED. The SARS-CoV-2 RNA is generally detectable in upper respiratoy specimens during the acute phase of infection. The lowest concentration of SARS-CoV-2 viral copies this assay can detect is 131 copies/mL. A negative result does not preclude SARS-Cov-2 infection and should not be used as the sole basis for treatment or other patient management decisions. A negative result  may occur with  improper specimen collection/handling, submission of specimen other than nasopharyngeal swab, presence of viral mutation(s) within the areas targeted by this assay, and inadequate number of viral copies (<131 copies/mL). A negative result must be combined with clinical observations, patient history, and epidemiological information. The expected result is Negative. Fact Sheet for Patients:  PinkCheek.be Fact Sheet for Healthcare Providers:  GravelBags.it This test is not yet ap proved or cleared by the [place] FDA and  has been authorized for detection and/or diagnosis of SARS-CoV-2 by FDA under an Emergency Use Authorization (EUA). This EUA will remain  in effect (meaning this test can be used) for the duration of the COVID-19 declaration under Section 564(b)(1) of the Act, 21 U.S.C. section 360bbb-3(b)(1), unless the authorization is terminated or revoked sooner.    Influenza A by PCR NEGATIVE NEGATIVE Final   Influenza B by PCR NEGATIVE NEGATIVE Final    Comment: (NOTE) The Xpert Xpress  SARS-CoV-2/FLU/RSV assay is intended as an aid in  the diagnosis of influenza from Nasopharyngeal swab specimens and  should not be used as a sole basis for treatment. Nasal washings and  aspirates are unacceptable for Xpert Xpress SARS-CoV-2/FLU/RSV  testing. Fact Sheet for Patients: PinkCheek.be Fact Sheet for Healthcare Providers: GravelBags.it This test is not yet approved or cleared by the [place] FDA and  has been authorized for detection and/or diagnosis of SARS-CoV-2 by  FDA under an Emergency Use Authorization (EUA). This EUA will remain  in effect (meaning this test can be used) for the duration of the  Covid-19 declaration under Section 564(b)(1) of the Act, 21  U.S.C. section 360bbb-3(b)(1), unless the authorization is  terminated or revoked. Performed at [facility], [place] [address] [place], [place] [address]   Culture, blood (routine x 2)     Status: None (Preliminary result)   Collection Time: 10/06/19  8:14 AM   Specimen: BLOOD  Result Value Ref Range Status   Specimen Description BLOOD RIGHT ANTECUBITAL  Final   Special Requests   Final    BOTTLES DRAWN AEROBIC AND ANAEROBIC Blood Culture adequate volume   Culture   Final    NO GROWTH 2 DAYS Performed at [facility], [place] [address] [place], [place] [address]    Report Status PENDING  Incomplete  Culture, blood (routine x 2)     Status: None (Preliminary result)   Collection Time: 10/06/19  8:16 AM   Specimen: BLOOD LEFT ARM  Result Value Ref Range Status   Specimen Description BLOOD LEFT ARM  Final   Special Requests   Final    BOTTLES DRAWN AEROBIC AND ANAEROBIC Blood Culture adequate volume   Culture   Final    NO GROWTH 2 DAYS Performed at [facility], [place] [address], [place], [place] [address]    Report Status PENDING  Incomplete  Culture, Urine     Status: Abnormal (Preliminary result)   Collection Time: 10/06/19   8:37 AM   Specimen: Urine, Random  Result Value Ref Range Status   Specimen Description URINE, RANDOM  Final   Special Requests   Final    NONE Performed at [facility], [place] [address], [place], [place] [address]    Culture 10,000 COLONIES/mL STAPHYLOCOCCUS EPIDERMIDIS (A)  Final   Report Status PENDING  Incomplete   Time spent: 30 min  SIGNED:   Marylu Lund, MD  [facility] 10/08/2019, 10:10 AM  If 7PM-7AM, please contact night-coverage

## 2019-10-08 NOTE — Progress Notes (Signed)
Occupational Therapy Treatment Patient Details Name: [name] [name]. MRN: [id number] DOB: 17-Feb-1946 Today's Date: 10/08/2019    History of present illness 74 y.o. male presenting with gross hematuria and catheter obstruction. 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 Large clot evacuated with ongoing bleeding requiring multiple blood products, pt transferred to IR for urgent bilateral prostatic artery embolization on 10/03/2019. Pt transferred to ICU on mechanical ventilation, self-extubated 10/04/2019. PMH legally blind, HTN, and AKI.   OT comments  Pt standing upon arrival, required minguard for functional mobility and for LB dressing. Pt reports using cane at baseline, reports his cane has been lost during admission. Pt requires UE support during mobility, recommend SPC, notified RN. Pt declining SNF level therapies and requesting to d/c home. Recommend HHOT with supervision intermittently. Pt with anticipated d/c this date, should pt remain in acute care, will continue to follow to maximize safety and independence with ADL/IADL and functional mobility.  Follow Up Recommendations  Home health OT;Supervision - Intermittent    Equipment Recommendations  Other (comment)(cane)    Recommendations for Other Services      Precautions / Restrictions Precautions Precautions: Fall Precaution Comments: JP drain, subrapubic tube, abdominal precautions for comfort Restrictions Weight Bearing Restrictions: No       Mobility Bed Mobility               General bed mobility comments: pt sitting in recliner upon arrival  Transfers Overall transfer level: Needs assistance Equipment used: 1 person hand held assist Transfers: Sit to/from Stand Sit to Stand: Min guard         General transfer comment: minguard for  safety    Balance Overall balance assessment: Needs assistance Sitting-balance support: Feet supported Sitting balance-Leahy Scale: Fair     Standing balance support: During functional activity;Single extremity supported Standing balance-Leahy Scale: Fair Standing balance comment: Pt able to stand without UE support. Pt amb with RW and with handheld support. Pt reported touching furniture and walls at home to maintain balance while amb.                           ADL either performed or assessed with clinical judgement   ADL Overall ADL's : Needs assistance/impaired                     Lower Body Dressing: Supervision/safety;Sit to/from stand   Toilet Transfer: Ambulation;Minimal assistance           Functional mobility during ADLs: Minimal assistance;Cane       Vision       Perception     Praxis      Cognition Arousal/Alertness: Awake/alert Behavior During Therapy: WFL for tasks assessed/performed Overall Cognitive Status: Within Functional Limits for tasks assessed                                          Exercises     Shoulder Instructions       General Comments      Pertinent Vitals/ Pain       Pain Assessment: No/denies pain  Home Living  Prior Functioning/Environment              Frequency  Min 2X/week        Progress Toward Goals  OT Goals(current goals can now be found in the care plan section)  Progress towards OT goals: Progressing toward goals  Acute Rehab OT Goals Patient Stated Goal: to walk more and to go home OT Goal Formulation: With patient Time For Goal Achievement: 10/20/19 Potential to Achieve Goals: Good ADL Goals Pt Will Perform Grooming: with modified independence;standing Pt Will Perform Lower Body Dressing: with modified independence;sit to/from stand Pt Will Transfer to Toilet: with modified  independence;ambulating Pt Will Perform Tub/Shower Transfer: with modified independence;Tub transfer Additional ADL Goal #1: Pt will progress to EOB independently in preparation for ADL.  Plan Discharge plan needs to be updated    Co-evaluation                 AM-PAC OT "6 Clicks" Daily Activity     Outcome Measure   Help from another person eating meals?: A Little Help from another person taking care of personal grooming?: A Little Help from another person toileting, which includes using toliet, bedpan, or urinal?: A Little Help from another person bathing (including washing, rinsing, drying)?: A Little Help from another person to put on and taking off regular upper body clothing?: A Little Help from another person to put on and taking off regular lower body clothing?: A Little 6 Click Score: 18    End of Session Equipment Utilized During Treatment: Gait belt  OT Visit Diagnosis: Other abnormalities of gait and mobility (R26.89);Pain   Activity Tolerance Patient tolerated treatment well   Patient Left in chair;with call bell/phone within reach;with chair alarm set   Nurse Communication Mobility status        Time: [id number] OT Time Calculation (min): 10 min  Charges: OT General Charges $OT Visit: 1 Visit OT Treatments $Self Care/Home Management : 8-22 mins  Helene Kelp OTR/L Acute Rehabilitation Services Office: [place] 10/08/2019, 1:52 PM

## 2019-10-08 NOTE — Progress Notes (Signed)
Physical Therapy Treatment Patient Details Name: [name] [name]. MRN: [id number] DOB: [birth year] Today's Date: 10/08/2019    History of Present Illness 74 y.o. male presenting with gross hematuria and catheter obstruction. 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 Large clot evacuated with ongoing bleeding requiring multiple blood products, pt transferred to IR for urgent bilateral prostatic artery embolization on 10/03/2019. Pt transferred to ICU on mechanical ventilation, self-extubated 10/04/2019. PMH legally blind, HTN, and AKI.    PT Comments    Pt demonstrates improvement through decreased assistance level with sit to stand and increased amb distance min guard with left hand held assist. Pt negotiated 1 flight of stairs x2 min guard to mimic home environment. Pt declined SNF, will recommend follow up with home health physical therapy. Pt continues to be motivated to complete physical therapy while in the acute setting. Pt will continue to benefit from physical therapy services while in acute care setting to improve functional mobility until d/c to next level of care.  Follow Up Recommendations  Home health PT;Supervision - Intermittent     Equipment Recommendations  None recommended by PT    Recommendations for Other Services       Precautions / Restrictions Precautions Precautions: Fall Precaution Comments: JP drain, subrapubic tube, abdominal precautions for comfort Restrictions Weight Bearing Restrictions: No    Mobility  Bed Mobility Overal bed mobility: Needs Assistance Bed Mobility: Supine to Sit     Supine to sit: Min guard        Transfers Overall transfer level: Needs assistance Equipment used: 1 person hand held assist Transfers: Sit to/from Stand Sit to Stand: Min guard         General  transfer comment: Pt required min guard for saftey, did not require bed elevation or verbal cues today. In stand pt required L handheld assist for stability and orientation.  Ambulation/Gait Ambulation/Gait assistance: Min guard Gait Distance (Feet): 240 Feet Assistive device: 1 person hand held assist Gait Pattern/deviations: Wide base of support;Decreased stride length     General Gait Details: Pt required min guard for saftey, stability and verbal cues for negotiating open, closed enivorments.   Stairs Stairs: Yes Stairs assistance: Min guard Stair Management: One rail Right;One rail Left;Alternating pattern Number of Stairs: 12 General stair comments: 2x12 steps with rail on R to mimick home environment. Pt used BUE on rail for assistance.Pt completed 2nd set of stair negotation upon self request.   Wheelchair Mobility    Modified Rankin (Stroke Patients Only)       Balance Overall balance assessment: Needs assistance Sitting-balance support: Feet supported Sitting balance-Leahy Scale: Fair     Standing balance support: During functional activity;Single extremity supported Standing balance-Leahy Scale: Fair                              Cognition Arousal/Alertness: Awake/alert Behavior During Therapy: WFL for tasks assessed/performed Overall Cognitive Status: Within Functional Limits for tasks assessed                                 General Comments: WFL for simple tasks today; pt demonstrated improvements with divided attention. Pt was able to talk and amb at the same time and did not require as many stops to answer questions.  Exercises General Exercises - Lower Extremity Hip Flexion/Marching: 10 reps;Both;AROM;Standing Mini-Sqauts: AROM;5 reps;Both;Standing    General Comments General comments (skin integrity, edema, etc.): Pt incision had no concerning changes. Pt motivated to complete amb and stair negotiation with therapy  today.      Pertinent Vitals/Pain Faces Pain Scale: No hurt    Home Living                      Prior Function            PT Goals (current goals can now be found in the care plan section) Acute Rehab PT Goals Patient Stated Goal: to walk more and to go home PT Goal Formulation: With patient Time For Goal Achievement: 10/21/19 Potential to Achieve Goals: Good Progress towards PT goals: Progressing toward goals    Frequency    Min 3X/week      PT Plan Current plan remains appropriate    Co-evaluation              AM-PAC PT "6 Clicks" Mobility   Outcome Measure  Help needed turning from your back to your side while in a flat bed without using bedrails?: None Help needed moving from lying on your back to sitting on the side of a flat bed without using bedrails?: A Little Help needed moving to and from a bed to a chair (including a wheelchair)?: A Little Help needed standing up from a chair using your arms (e.g., wheelchair or bedside chair)?: A Little Help needed to walk in hospital room?: A Little Help needed climbing 3-5 steps with a railing? : A Little 6 Click Score: 19    End of Session Equipment Utilized During Treatment: Gait belt Activity Tolerance: Patient tolerated treatment well Patient left: in chair;with call bell/phone within reach;with chair alarm set Nurse Communication: Mobility status;Other (comment)(Pt request to talk with MD) PT Visit Diagnosis: Unsteadiness on feet (R26.81);Other abnormalities of gait and mobility (R26.89);Difficulty in walking, not elsewhere classified (R26.2);Muscle weakness (generalized) (M62.81)     Time: [id number] PT Time Calculation (min) (ACUTE ONLY): 30 min  Charges:  $Gait Training: 8-22 mins $Therapeutic Exercise: 8-22 mins                     [employer] SPT 10/08/2019    Rolland Porter 10/08/2019, 10:13 AM

## 2019-10-09 ENCOUNTER — Other Ambulatory Visit: Payer: Self-pay

## 2019-10-09 ENCOUNTER — Emergency Department (HOSPITAL_COMMUNITY): Payer: Medicare Other

## 2019-10-09 DIAGNOSIS — R509 Fever, unspecified: Secondary | ICD-10-CM | POA: Diagnosis not present

## 2019-10-09 LAB — CBC WITH DIFFERENTIAL/PLATELET
Abs Immature Granulocytes: 0.07 10*3/uL (ref 0.00–0.07)
Basophils Absolute: 0.1 10*3/uL (ref 0.0–0.1)
Basophils Relative: 1 %
Eosinophils Absolute: 0.1 10*3/uL (ref 0.0–0.5)
Eosinophils Relative: 1 %
HCT: 28.2 % — ABNORMAL LOW (ref 39.0–52.0)
Hemoglobin: 8.3 g/dL — ABNORMAL LOW (ref 13.0–17.0)
Immature Granulocytes: 1 %
Lymphocytes Relative: 8 %
Lymphs Abs: 0.8 10*3/uL (ref 0.7–4.0)
MCH: 25.2 pg — ABNORMAL LOW (ref 26.0–34.0)
MCHC: 29.4 g/dL — ABNORMAL LOW (ref 30.0–36.0)
MCV: 85.5 fL (ref 80.0–100.0)
Monocytes Absolute: 1.2 10*3/uL — ABNORMAL HIGH (ref 0.1–1.0)
Monocytes Relative: 11 %
Neutro Abs: 8.4 10*3/uL — ABNORMAL HIGH (ref 1.7–7.7)
Neutrophils Relative %: 78 %
Platelets: 328 10*3/uL (ref 150–400)
RBC: 3.3 MIL/uL — ABNORMAL LOW (ref 4.22–5.81)
RDW: 21.2 % — ABNORMAL HIGH (ref 11.5–15.5)
WBC: 10.6 10*3/uL — ABNORMAL HIGH (ref 4.0–10.5)
nRBC: 0.2 % (ref 0.0–0.2)

## 2019-10-09 LAB — URINALYSIS, ROUTINE W REFLEX MICROSCOPIC
Bilirubin Urine: NEGATIVE
Glucose, UA: NEGATIVE mg/dL
Ketones, ur: NEGATIVE mg/dL
Nitrite: NEGATIVE
Protein, ur: 100 mg/dL — AB
Specific Gravity, Urine: 1.014 (ref 1.005–1.030)
pH: 6 (ref 5.0–8.0)

## 2019-10-09 LAB — URINE CULTURE
Culture: 10000 — AB
Culture: NO GROWTH

## 2019-10-09 LAB — LACTIC ACID, PLASMA: Lactic Acid, Venous: 1.6 mmol/L (ref 0.5–1.9)

## 2019-10-09 LAB — URINALYSIS, MICROSCOPIC (REFLEX)
Bacteria, UA: NONE SEEN
RBC / HPF: >50 RBC/hpf (ref 0–5)

## 2019-10-09 NOTE — Progress Notes (Addendum)
Spoke to patient regarding needs at home. Patient states he feels that he does not know what he is doing with tubes, Stated he did not feel prepared when he went home from the hospital.  Looking at discharge paperwork [facility] was following patient with nursing. Lina Sar, representative with [facility] and left a message. They will continue to follow patient. Cane for patient obtained via adapt on discharge from hospital 5/4. Patient wants someone to run errands, but patient made aware that  services not provided for this via insurance, this would be private pay.

## 2019-10-09 NOTE — ED Provider Notes (Signed)
TOC team has seen. Has home health and she has addressed some of his needs.  Besides some hypertension his vital signs are unremarkable.  He did have a low-grade fever at first though this resolved without treatment.  Discharged home with return precautions.   Sherwood Gambler, MD 10/09/19 [phone]

## 2019-10-09 NOTE — ED Notes (Signed)
Pt restful and without distress.  Leg bag changed to drainage bag.  Clots noted and urine unable to drain out of bag.  Pt denies any pain or needs at this time.

## 2019-10-09 NOTE — ED Provider Notes (Signed)
[facility] EMERGENCY DEPARTMENT Provider Note   CSN: [id number] Arrival date & time: 10/08/19  2017     History Chief Complaint  Patient presents with  . Foley replacment    [name] [name]. is a 74 y.o. male.  Patient presents to the emergency department because he thinks he might have inadvertently pulled one of his bladder catheters out.  He reports that he was hospitalized for a week and discharged yesterday.  He states that he is not sure what to do with all of the tubes that he has.  He noticed that one of the tubes did not have a bag on it and he was concerned that he might have accidentally pulled it off getting in and out of the taxi earlier today while running errands.  He has no other complaints.  He has felt well today.        Past Medical History:  Diagnosis Date  . Anemia 10/02/2019   DUE TO BLOOD LOSS  . Blind   . Disorder of bone and cartilage, unspecified   . Edema   . Elevated prostate specific antigen (PSA)   . Hypertension   . Hypopotassemia   . Nonspecific abnormal results of liver function study   . Unspecified glaucoma(365.9)     Patient Active Problem List   Diagnosis Date Noted  . Swelling of lower leg 10/02/2019  . AKI (acute kidney injury) ([place]) 05/25/2019  . BPH (benign prostatic hyperplasia) 05/25/2019  . Anemia due to blood loss, chronic   . Acute blood loss anemia 01/14/2018  . Hypokalemia 01/14/2018  . 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 Hematuria 12/23/2012  . Chest pain 12/23/2012  . Anemia due to blood loss, acute 12/23/2012  . Elevated prostate specific antigen (PSA)   . Hypertension   . Edema     Past Surgical History:  Procedure Laterality Date  . CYSTOSCOPY WITH DIRECT VISION INTERNAL URETHROTOMY N/A 10/02/2019   Procedure: CYSTOSCOPY WITH DIRECT VISION INTERNAL URETHROTOMY WITH CLOT EVACUATION retrograd urethralgram Coverted to Open.;  Surgeon: Lucas Mallow, MD;  Location: [facility];  Service: Urology;   Laterality: N/A;  . CYSTOSCOPY WITH FULGERATION N/A 01/17/2018   Procedure: CYSTOSCOPY CLOT EVACUATION WITH FULGERATION TUR BIOPSY OF BLADDER NECK;  Surgeon: Irine Seal, MD;  Location: [facility];  Service: Urology;  Laterality: N/A;  . CYSTOSTOMY N/A 10/02/2019   Procedure: Open Cystostomy Suprapubic tube placement and JP drain and urethral dilation;  Surgeon: Lucas Mallow, MD;  Location: [facility];  Service: Urology;  Laterality: N/A;  . EYE SURGERY Bilateral 2009   for glaucoma  . EYE SURGERY Left 2014  . HERNIA REPAIR Right 2003   Dr. Jeraldine Loots  . IR ANGIOGRAM EXTREMITY RIGHT  10/03/2019  . IR ANGIOGRAM PELVIS SELECTIVE OR SUPRASELECTIVE  10/03/2019  . IR ANGIOGRAM PELVIS SELECTIVE OR SUPRASELECTIVE  10/03/2019  . IR ANGIOGRAM SELECTIVE EACH ADDITIONAL VESSEL  10/03/2019  . IR ANGIOGRAM SELECTIVE EACH ADDITIONAL VESSEL  10/03/2019  . IR ANGIOGRAM SELECTIVE EACH ADDITIONAL VESSEL  10/03/2019  . IR EMBO ART  VEN HEMORR LYMPH EXTRAV  INC GUIDE ROADMAPPING  10/03/2019  . IR US GUIDE VASC ACCESS RIGHT  10/03/2019  . LAPAROSCOPIC APPENDECTOMY N/A 12/24/2012   Procedure:  LAPAROSCOPIC APPENDECTOMY ;  Surgeon: Pedro Earls, MD;  Location: [facility];  Service: General;  Laterality: N/A;  . TRANSURETHRAL RESECTION OF PROSTATE N/A 05/27/2019   Procedure: Cystoscopy Clot Evacuation and Fulgration;  Surgeon: Ardis Hughs,  MD;  Location: [facility];  Service: Urology;  Laterality: N/A;       Family History  Problem Relation Age of Onset  . Kidney failure Mother     Social History   Tobacco Use  . Smoking status: Former Smoker    Types: Cigarettes    Quit date: 06/05/1968    Years since quitting: 51.3  . Smokeless tobacco: Never Used  Substance Use Topics  . Alcohol use: No    Comment: STOPPED IN 1970S  . Drug use: No    Home Medications Prior to Admission medications   Medication Sig Start Date End Date Taking? Authorizing Provider  Ascorbic Acid (VITAMIN C WITH ROSE HIPS) 1000 MG  tablet Take 1,000 mg by mouth daily with lunch.   Yes [provider]  aspirin-acetaminophen-caffeine (EXCEDRIN MIGRAINE) [phone] MG tablet Take 1 tablet by mouth daily as needed (pain).   Yes [provider]  brimonidine (ALPHAGAN) 0.2 % ophthalmic solution Place 1 drop into both eyes 2 (two) times daily. 03/11/19  Yes [provider]  dorzolamide-timolol (COSOPT) 22.3-6.8 MG/ML ophthalmic solution APPOINTMENT OVERDUE, instill 1 drop in both eyes twice daily Patient taking differently: Place 1 drop into both eyes 2 (two) times daily.  07/14/13  Yes [name], NP  folic acid (FOLVITE) [id number] MCG tablet Take 800 mcg by mouth daily with lunch.    Yes [provider]  hydrochlorothiazide (MICROZIDE) 12.5 MG capsule Take 1 capsule (12.5 mg total) by mouth daily. 07/03/19  Yes [name], NP  Magnesium 250 MG TABS Take 250 mg by mouth daily as needed (migraine headache).    Yes [provider]  metoprolol tartrate (LOPRESSOR) 25 MG tablet Take 12.5 mg by mouth 2 (two) times daily.   Yes [provider]  pilocarpine (PILOCAR) 2 % ophthalmic solution Place 1 drop into the right eye every 6 (six) hours.  12/25/17  Yes [provider]  potassium chloride SA (KLOR-CON) 20 MEQ tablet Take 1 tablet (20 mEq total) by mouth daily. 07/03/19  Yes [name], NP  pyridOXINE (VITAMIN B-6) 50 MG tablet Take 50 mg by mouth daily with lunch.   Yes [provider]  vitamin B-12 (CYANOCOBALAMIN) 1000 MCG tablet Take 1,000 mcg by mouth daily with lunch.   Yes [provider]  azithromycin (ZITHROMAX Z-PAK) 250 MG tablet Take 2 tablets (500 mg) on  Day 1,  followed by 1 tablet (250 mg) once daily on Days 2 through 5. 10/08/19 10/13/19  Donne Hazel, MD  cefdinir (OMNICEF) 300 MG capsule Take 1 capsule (300 mg total) by mouth 2 (two) times daily for 5 days. 10/08/19 10/13/19  Donne Hazel, MD  oxybutynin (DITROPAN) 5 MG tablet  Take 1 tablet (5 mg total) by mouth every 8 (eight) hours as needed for bladder spasms. 10/08/19   Donne Hazel, MD    Allergies    Patient has no known allergies.  Review of Systems   Review of Systems  Respiratory: Negative for shortness of breath.   Cardiovascular: Negative for chest pain.  Gastrointestinal: Negative for abdominal pain.  All other systems reviewed and are negative.   Physical Exam Updated Vital Signs BP 135/86   Pulse 72   Temp 98.6 F (37 C) (Oral)   Resp 16   Ht 5\' 8"  (1.727 m)   Wt 91 kg   SpO2 91%   BMI 30.50 kg/m   Physical Exam Vitals and nursing note reviewed.  Constitutional:      General: He is not in acute distress.    Appearance: Normal appearance. He is well-developed.  HENT:     Head: Normocephalic and atraumatic.     Right Ear: Hearing normal.     Left Ear: Hearing normal.     Nose: Nose normal.  Cardiovascular:     Rate and Rhythm: Regular rhythm.     Heart sounds: S1 normal and S2 normal. No murmur. No friction rub. No gallop.   Pulmonary:     Effort: Pulmonary effort is normal. No respiratory distress.     Breath sounds: Normal breath sounds.  Chest:     Chest wall: No tenderness.  Abdominal:     General: Bowel sounds are normal.     Palpations: Abdomen is soft.     Tenderness: There is no abdominal tenderness. There is no guarding or rebound. Negative signs include Murphy's sign and McBurney's sign.     Hernia: No hernia is present.  Musculoskeletal:        General: Normal range of motion.     Cervical back: Normal range of motion and neck supple.  Skin:    General: Skin is warm and dry.     Findings: No rash.  Neurological:     Mental Status: He is alert and oriented to person, place, and time.     GCS: GCS eye subscore is 4. GCS verbal subscore is 5. GCS motor subscore is 6.     Cranial Nerves: No cranial nerve deficit.     Sensory: No sensory deficit.     Coordination: Coordination normal.  Psychiatric:         Speech: Speech normal.        Behavior: Behavior normal.        Thought Content: Thought content normal.     ED Results / Procedures / Treatments   Labs (all labs ordered are listed, but only abnormal results are displayed) Labs Reviewed  CBC - Abnormal; Notable for the following components:      Result Value   WBC 11.9 (*)    RBC 3.36 (*)    Hemoglobin 8.5 (*)    HCT 28.3 (*)    MCH 25.3 (*)    RDW 20.8 (*)    All other components within normal limits  BASIC METABOLIC PANEL - Abnormal; Notable for the following components:   CO2 20 (*)    Glucose, Bld 113 (*)    Calcium 8.0 (*)    All other components within normal limits  CBC WITH DIFFERENTIAL/PLATELET - Abnormal; Notable for the following components:   WBC 10.6 (*)    RBC 3.30 (*)    Hemoglobin 8.3 (*)    HCT 28.2 (*)    MCH 25.2 (*)    MCHC 29.4 (*)    RDW 21.2 (*)    Neutro Abs 8.4 (*)    Monocytes Absolute 1.2 (*)    All other components within normal limits  URINALYSIS, ROUTINE W REFLEX MICROSCOPIC - Abnormal; Notable for the following components:   Color, Urine RED (*)    APPearance CLOUDY (*)    Hgb urine dipstick LARGE (*)    Protein, ur 100 (*)    Leukocytes,Ua SMALL (*)    All other components within normal limits  CULTURE, BLOOD (ROUTINE X 2)  CULTURE, BLOOD (ROUTINE X 2)  URINE CULTURE  LACTIC ACID, PLASMA  URINALYSIS, MICROSCOPIC (REFLEX)    EKG None  Radiology [facility]  1 View  Result Date: 10/09/2019 CLINICAL DATA:  Fever EXAM: PORTABLE CHEST 1 VIEW COMPARISON:  10/06/2019 FINDINGS: Improved atelectasis in the lung bases. No consolidation, features of edema, pneumothorax, or effusion. The cardiomediastinal contours are unremarkable. No acute osseous or soft tissue abnormality. IMPRESSION: Improving atelectasis.  No acute cardiopulmonary abnormality. Electronically Signed   By: Lovena Le M.D.   On: 10/09/2019 04:01    Procedures Procedures (including critical care time)  Medications  Ordered in ED Medications - No data to display  ED Course  I have reviewed the triage vital signs and the nursing notes.  Pertinent labs & imaging results that were available during my care of the patient were reviewed by me and considered in my medical decision making (see chart for details).    MDM Rules/Calculators/A&P                      Reviewing records reveals that the suprapubic catheter should be capped, never had a drainage bag in place.  Foley catheter is draining urine.  It is full, however.  Patient does not appear to understand how to empty the Foley bag.  Will place case manager consultation to help with home health needs as he feels overwhelmed.  Triage vitals included a temp of 100.8.  Patient has not felt fever symptoms.  When he was rechecked he did not have a fever, lab work checked including lactic acid. Labs are all normal.  Reviewing his records reveals that he did have sepsis during his hospitalization that was felt to be secondary to hospital associated pneumonia.  He is on Omnicef and Zithromax.  Chest x-ray is improved from prior, no evidence of pneumonia.  Patient appears well.  It is unclear what the initial temperature because was or if it was periods.  He does not have any evidence of sepsis and is still on antibiotic coverage for his previous pneumonia.  Urinalysis does not suggest infection, culture pending.  Patient will therefore be discharged.  Final Clinical Impression(s) / ED Diagnoses Final diagnoses:  Fever, unspecified fever cause    Rx / DC Orders ED Discharge Orders    None       Meridian Scherger, Gwenyth Allegra, MD 10/09/19 [phone]

## 2019-10-09 NOTE — ED Notes (Signed)
Called [facility] for transport.  

## 2019-10-11 ENCOUNTER — Emergency Department (HOSPITAL_COMMUNITY)
Admission: EM | Admit: 2019-10-11 | Discharge: 2019-10-12 | Disposition: A | Discharge status: Home / Self Care | Payer: Medicare Other | Attending: Emergency Medicine | Admitting: Emergency Medicine

## 2019-10-11 ENCOUNTER — Emergency Department (HOSPITAL_COMMUNITY): Payer: Medicare Other

## 2019-10-11 DIAGNOSIS — T83098A Other mechanical complication of other indwelling urethral catheter, initial encounter: Secondary | ICD-10-CM | POA: Diagnosis not present

## 2019-10-11 DIAGNOSIS — Z79899 Other long term (current) drug therapy: Secondary | ICD-10-CM | POA: Diagnosis not present

## 2019-10-11 DIAGNOSIS — Z87891 Personal history of nicotine dependence: Secondary | ICD-10-CM | POA: Diagnosis not present

## 2019-10-11 DIAGNOSIS — A4189 Other specified sepsis: Secondary | ICD-10-CM

## 2019-10-11 DIAGNOSIS — R338 Other retention of urine: Secondary | ICD-10-CM

## 2019-10-11 DIAGNOSIS — R109 Unspecified abdominal pain: Secondary | ICD-10-CM | POA: Diagnosis not present

## 2019-10-11 DIAGNOSIS — N401 Enlarged prostate with lower urinary tract symptoms: Secondary | ICD-10-CM | POA: Diagnosis not present

## 2019-10-11 DIAGNOSIS — D62 Acute posthemorrhagic anemia: Secondary | ICD-10-CM

## 2019-10-11 DIAGNOSIS — T83091D Other mechanical complication of indwelling urethral catheter, subsequent encounter: Secondary | ICD-10-CM | POA: Diagnosis not present

## 2019-10-11 DIAGNOSIS — I10 Essential (primary) hypertension: Secondary | ICD-10-CM | POA: Diagnosis not present

## 2019-10-11 DIAGNOSIS — N138 Other obstructive and reflux uropathy: Secondary | ICD-10-CM | POA: Diagnosis not present

## 2019-10-11 DIAGNOSIS — T83031A Leakage of indwelling urethral catheter, initial encounter: Secondary | ICD-10-CM | POA: Diagnosis present

## 2019-10-11 DIAGNOSIS — T839XXA Unspecified complication of genitourinary prosthetic device, implant and graft, initial encounter: Secondary | ICD-10-CM | POA: Insufficient documentation

## 2019-10-11 DIAGNOSIS — K7460 Unspecified cirrhosis of liver: Secondary | ICD-10-CM | POA: Diagnosis not present

## 2019-10-11 DIAGNOSIS — N35912 Unspecified bulbous urethral stricture, male: Secondary | ICD-10-CM | POA: Diagnosis not present

## 2019-10-11 DIAGNOSIS — J1529 Pneumonia due to other staphylococcus: Secondary | ICD-10-CM

## 2019-10-11 LAB — BASIC METABOLIC PANEL
Anion gap: 12 (ref 5–15)
BUN: 14 mg/dL (ref 8–23)
CO2: 21 mmol/L — ABNORMAL LOW (ref 22–32)
Calcium: 7.9 mg/dL — ABNORMAL LOW (ref 8.9–10.3)
Chloride: 104 mmol/L (ref 98–111)
Creatinine, Ser: 1.04 mg/dL (ref 0.61–1.24)
GFR calc Af Amer: >60 mL/min (ref >60)
GFR calc non Af Amer: >60 mL/min (ref >60)
Glucose, Bld: 107 mg/dL — ABNORMAL HIGH (ref 70–99)
Potassium: 3.7 mmol/L (ref 3.5–5.1)
Sodium: 137 mmol/L (ref 135–145)

## 2019-10-11 LAB — CBC
HCT: 25.7 % — ABNORMAL LOW (ref 39.0–52.0)
Hemoglobin: 7.7 g/dL — ABNORMAL LOW (ref 13.0–17.0)
MCH: 24.9 pg — ABNORMAL LOW (ref 26.0–34.0)
MCHC: 30 g/dL (ref 30.0–36.0)
MCV: 83.2 fL (ref 80.0–100.0)
Platelets: 356 10*3/uL (ref 150–400)
RBC: 3.09 MIL/uL — ABNORMAL LOW (ref 4.22–5.81)
RDW: 21.1 % — ABNORMAL HIGH (ref 11.5–15.5)
WBC: 10.8 10*3/uL — ABNORMAL HIGH (ref 4.0–10.5)
nRBC: 0.4 % — ABNORMAL HIGH (ref 0.0–0.2)

## 2019-10-11 LAB — CULTURE, BLOOD (ROUTINE X 2)
Culture: NO GROWTH
Culture: NO GROWTH
Special Requests: ADEQUATE
Special Requests: ADEQUATE

## 2019-10-11 MED ORDER — CEPHALEXIN 500 MG PO CAPS
500.0000 mg | ORAL_CAPSULE | Freq: Three times a day (TID) | ORAL | 0 refills | Status: AC
Start: 2019-10-11 — End: 2019-10-18

## 2019-10-11 MED ORDER — IOHEXOL 300 MG/ML  SOLN
100.0000 mL | Freq: Once | INTRAMUSCULAR | Status: AC | PRN
  Administered 2019-10-11: 100 mL via INTRAVENOUS

## 2019-10-11 NOTE — ED Notes (Signed)
Discharge instructions discussed with pt. Pt verbalized understanding with no questions at this time.   Pt uncertain if able to go home with family member or [facility]. Pt to be moved in the hall while pending ride home

## 2019-10-11 NOTE — ED Provider Notes (Addendum)
[place] EMERGENCY DEPARTMENT Provider Note   CSN: [id number] Arrival date & time: 10/11/19  1550     History No chief complaint on file.   [name] Cayl Pike. is a 74 y.o. male with pertinent past medical history of hypertension, BPH, anemia, who presents to the emergency department today for his Foley catheter leaking.  Patient has had Foley catheter for the last 7 years.  Recently had blood clot in his bladder that required evacuation. Per chart review he had a cystoscopy with clot evacuation of bladder with Dr. Gloriann Loan about 10 days ago.   he went laparotomy after having suprpubic catheter place one month ago. He was admitted one week ago and spent many days in the hospital. Pt states that he is back today because he thinks he has a clot. Denies any abdominal pain or suprapubic pain or tenderness. Patient states that the catheter is draining, however at 2:30 PM he states that he had a movement and urine leaked all over his bed.  He states that he feels like it is clogged.  He denies any fevers, chest pain, abdominal pain, suprapubic pain, chills, abdominal distention, shortness of breath, nausea, vomiting.  Patient states that he checks his temperature daily and he has not been running a fever.  Patient states that this has happened to him before.  Patient states that a new catheter was placed 2 days ago, when he was seen in the ER .Per chart review he was seen in the emergency room and discharged 2 days ago for fever of unspecified origin.He was provided with instructions from case manager consultation on how to empty and use a Foley catheter.  Foley catheter is a 76 [place], there is 30 cc of urine in the Foley catheter, foley is in place, bag shows pink urine with some blood clots.  HPI     Past Medical History:  Diagnosis Date  . Anemia 10/02/2019   DUE TO BLOOD LOSS  . Blind   . Disorder of bone and cartilage, unspecified   . Edema   . Elevated prostate specific  antigen (PSA)   . Hypertension   . Hypopotassemia   . Nonspecific abnormal results of liver function study   . Unspecified glaucoma(365.9)     Patient Active Problem List   Diagnosis Date Noted  . Swelling of lower leg 10/02/2019  . AKI (acute kidney injury) ([place]) 05/25/2019  . BPH (benign prostatic hyperplasia) 05/25/2019  . Anemia due to blood loss, chronic   . Acute blood loss anemia 01/14/2018  . Hypokalemia 01/14/2018  . 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 Hematuria 12/23/2012  . Chest pain 12/23/2012  . Anemia due to blood loss, acute 12/23/2012  . Elevated prostate specific antigen (PSA)   . Hypertension   . Edema     Past Surgical History:  Procedure Laterality Date  . CYSTOSCOPY WITH DIRECT VISION INTERNAL URETHROTOMY N/A 10/02/2019   Procedure: CYSTOSCOPY WITH DIRECT VISION INTERNAL URETHROTOMY WITH CLOT EVACUATION retrograd urethralgram Coverted to Open.;  Surgeon: Lucas Mallow, MD;  Location: [facility];  Service: Urology;  Laterality: N/A;  . CYSTOSCOPY WITH FULGERATION N/A 01/17/2018   Procedure: CYSTOSCOPY CLOT EVACUATION WITH FULGERATION TUR BIOPSY OF BLADDER NECK;  Surgeon: Irine Seal, MD;  Location: [facility];  Service: Urology;  Laterality: N/A;  . CYSTOSTOMY N/A 10/02/2019   Procedure: Open Cystostomy Suprapubic tube placement and JP drain and urethral dilation;  Surgeon: Lucas Mallow, MD;  Location: [facility];  Service: Urology;  Laterality: N/A;  . EYE SURGERY Bilateral 2009   for glaucoma  . EYE SURGERY Left 2014  . HERNIA REPAIR Right 2003   Dr. Jeraldine Loots  . IR ANGIOGRAM EXTREMITY RIGHT  10/03/2019  . IR ANGIOGRAM PELVIS SELECTIVE OR SUPRASELECTIVE  10/03/2019  . IR ANGIOGRAM PELVIS SELECTIVE OR SUPRASELECTIVE  10/03/2019  . IR ANGIOGRAM SELECTIVE EACH ADDITIONAL VESSEL  10/03/2019  . IR ANGIOGRAM SELECTIVE EACH ADDITIONAL VESSEL  10/03/2019  . IR ANGIOGRAM SELECTIVE EACH ADDITIONAL VESSEL  10/03/2019  . IR EMBO ART  VEN HEMORR LYMPH EXTRAV  INC GUIDE  ROADMAPPING  10/03/2019  . IR US GUIDE VASC ACCESS RIGHT  10/03/2019  . LAPAROSCOPIC APPENDECTOMY N/A 12/24/2012   Procedure:  LAPAROSCOPIC APPENDECTOMY ;  Surgeon: Pedro Earls, MD;  Location: [facility];  Service: General;  Laterality: N/A;  . TRANSURETHRAL RESECTION OF PROSTATE N/A 05/27/2019   Procedure: Cystoscopy Clot Evacuation and Fulgration;  Surgeon: Ardis Hughs, MD;  Location: [facility];  Service: Urology;  Laterality: N/A;       Family History  Problem Relation Age of Onset  . Kidney failure Mother     Social History   Tobacco Use  . Smoking status: Former Smoker    Types: Cigarettes    Quit date: 06/05/1968    Years since quitting: 51.3  . Smokeless tobacco: Never Used  Substance Use Topics  . Alcohol use: No    Comment: STOPPED IN 1970S  . Drug use: No    Home Medications Prior to Admission medications   Medication Sig Start Date End Date Taking? Authorizing Provider  Ascorbic Acid (VITAMIN C WITH ROSE HIPS) 1000 MG tablet Take 1,000 mg by mouth daily with lunch.    [provider]  aspirin-acetaminophen-caffeine (EXCEDRIN MIGRAINE) [phone] MG tablet Take 1 tablet by mouth daily as needed (pain).    [provider]  azithromycin (ZITHROMAX Z-PAK) 250 MG tablet Take 2 tablets (500 mg) on  Day 1,  followed by 1 tablet (250 mg) once daily on Days 2 through 5. 10/08/19 10/13/19  Donne Hazel, MD  brimonidine (ALPHAGAN) 0.2 % ophthalmic solution Place 1 drop into both eyes 2 (two) times daily. 03/11/19   [provider]  cefdinir (OMNICEF) 300 MG capsule Take 1 capsule (300 mg total) by mouth 2 (two) times daily for 5 days. 10/08/19 10/13/19  Donne Hazel, MD  cephALEXin (KEFLEX) 500 MG capsule Take 1 capsule (500 mg total) by mouth 3 (three) times daily for 7 days. 10/11/19 10/18/19  Alfredia Client, PA-C  dorzolamide-timolol (COSOPT) 22.3-6.8 MG/ML ophthalmic solution APPOINTMENT OVERDUE, instill 1 drop in both eyes twice daily Patient taking  differently: Place 1 drop into both eyes 2 (two) times daily.  07/14/13   [name], NP  folic acid (FOLVITE) [id number] MCG tablet Take 800 mcg by mouth daily with lunch.     [provider]  hydrochlorothiazide (MICROZIDE) 12.5 MG capsule Take 1 capsule (12.5 mg total) by mouth daily. 07/03/19   [name], NP  Magnesium 250 MG TABS Take 250 mg by mouth daily as needed (migraine headache).     [provider]  metoprolol tartrate (LOPRESSOR) 25 MG tablet Take 12.5 mg by mouth 2 (two) times daily.    [provider]  oxybutynin (DITROPAN) 5 MG tablet Take 1 tablet (5 mg total) by mouth every 8 (eight) hours as needed for bladder spasms. 10/08/19   Donne Hazel, MD  pilocarpine [facility])  2 % ophthalmic solution Place 1 drop into the right eye every 6 (six) hours.  12/25/17   [provider]  potassium chloride SA (KLOR-CON) 20 MEQ tablet Take 1 tablet (20 mEq total) by mouth daily. 07/03/19   [name], NP  pyridOXINE (VITAMIN B-6) 50 MG tablet Take 50 mg by mouth daily with lunch.    [provider]  vitamin B-12 (CYANOCOBALAMIN) 1000 MCG tablet Take 1,000 mcg by mouth daily with lunch.    [provider]    Allergies    Patient has no known allergies.  Review of Systems   Review of Systems  Constitutional: Negative for chills, diaphoresis, fatigue and fever.  HENT: Negative for congestion, sore throat and trouble swallowing.   Eyes: Negative for pain and visual disturbance.  Respiratory: Negative for cough, shortness of breath and wheezing.   Cardiovascular: Negative for chest pain, palpitations and leg swelling.  Gastrointestinal: Negative for abdominal distention, abdominal pain, diarrhea, nausea and vomiting.  Genitourinary: Negative for difficulty urinating, dysuria, flank pain, frequency, scrotal swelling, testicular pain and urgency.       Foley Catheter leaking   Musculoskeletal: Negative for back pain, neck pain  and neck stiffness.  Skin: Negative for pallor.  Neurological: Negative for dizziness, speech difficulty, weakness and headaches.  Psychiatric/Behavioral: Negative for confusion.    Physical Exam Updated Vital Signs BP 116/74   Pulse 78   Temp 98 F (36.7 C) (Oral)   Resp 17   SpO2 100%   Physical Exam Exam conducted with a chaperone present.  Constitutional:      General: He is not in acute distress.    Appearance: Normal appearance. He is not ill-appearing, toxic-appearing or diaphoretic.  HENT:     Mouth/Throat:     Mouth: Mucous membranes are moist.     Pharynx: Oropharynx is clear.  Eyes:     General: No scleral icterus.    Extraocular Movements: Extraocular movements intact.     Pupils: Pupils are equal, round, and reactive to light.  Cardiovascular:     Rate and Rhythm: Normal rate and regular rhythm.     Pulses: Normal pulses.     Heart sounds: Normal heart sounds.  Pulmonary:     Effort: Pulmonary effort is normal. No respiratory distress.     Breath sounds: Normal breath sounds. No stridor. No wheezing, rhonchi or rales.  Chest:     Chest wall: No tenderness.  Abdominal:     General: Abdomen is flat. There is no distension.     Palpations: Abdomen is soft.     Tenderness: There is no abdominal tenderness. There is no guarding or rebound.  Genitourinary:    Comments: Foley catheter present, Foley catheter is currently draining.  Nurse irrigated and urine is flowing.  Pink urine in Foley catheter has 2 small clots. Suprapubic catheter capped Jp draining serous fluid from surgical site, overlying erythema noted. No tenderness to any of the drain sites, no suprapubic tenderness.   Musculoskeletal:        General: No swelling or tenderness. Normal range of motion.     Cervical back: Normal range of motion and neck supple. No rigidity.     Right lower leg: No edema.     Left lower leg: No edema.  Skin:    General: Skin is warm and dry.     Capillary Refill:  Capillary refill takes less than 2 seconds.     Coloration: Skin is not pale.  Neurological:     General: No focal deficit present.     Mental Status: He is alert and oriented to person, place, and time.  Psychiatric:        Mood and Affect: Mood normal.        Behavior: Behavior normal.     ED Results / Procedures / Treatments   Labs (all labs ordered are listed, but only abnormal results are displayed) Labs Reviewed  CBC - Abnormal; Notable for the following components:      Result Value   WBC 10.8 (*)    RBC 3.09 (*)    Hemoglobin 7.7 (*)    HCT 25.7 (*)    MCH 24.9 (*)    RDW 21.1 (*)    nRBC 0.4 (*)    All other components within normal limits  BASIC METABOLIC PANEL - Abnormal; Notable for the following components:   CO2 21 (*)    Glucose, Bld 107 (*)    Calcium 7.9 (*)    All other components within normal limits    EKG None  Radiology CT ABDOMEN PELVIS WO CONTRAST  Result Date: 10/11/2019 CLINICAL DATA:  74 year old male with abdominal pain. Concern for postsurgical infection. EXAM: CT ABDOMEN AND PELVIS WITHOUT CONTRAST TECHNIQUE: Multidetector CT imaging of the abdomen and pelvis was performed following the standard protocol without IV contrast. COMPARISON:  Abdominal radiograph dated 10/03/2019. FINDINGS: Evaluation of this exam is limited in the absence of intravenous contrast. Lower chest: The visualized lung bases are clear. There is hypoattenuation of the cardiac blood pool suggestive of anemia. Clinical correlation is recommended. No intra-abdominal free air or free fluid. Hepatobiliary: Cirrhosis. No intrahepatic biliary ductal dilatation. The gallbladder is predominantly contracted. No calcified gallstone. Subcentimeter right hepatic hypodense focus is too small to characterize. Pancreas: Unremarkable. No pancreatic ductal dilatation or surrounding inflammatory changes. Spleen: Normal in size without focal abnormality. Adrenals/Urinary Tract: The adrenal glands  are unremarkable. There is no hydronephrosis or nephrolithiasis on either side. Bilateral renal hypodense lesions measure up to 5 cm in the inferior pole of the right kidney. These lesions are suboptimally characterized on this noncontrast CT but appear similar to prior CT and likely cysts. The visualized ureters are unremarkable. The urinary bladder is decompressed around a Foley catheter a suprapubic catheter. There is compression of the urinary bladder secondary to large prostate or prostate mass. Stomach/Bowel: There is no bowel obstruction or active inflammation. Moderate stool throughout the colon. Appendectomy. Vascular/Lymphatic: Mild aortoiliac atherosclerotic disease. The IVC is unremarkable. No portal venous gas. There is no adenopathy. Reproductive: Enlarged prostate gland with median lobe hypertrophy and associated mass effect and compression of the bladder. The prostate measures approximately 8 cm in transverse axial diameter. Other: Small fat containing left inguinal hernia. Musculoskeletal: Midline vertical anterior pelvic wall surgical incision and cutaneous clips. No acute osseous pathology. IMPRESSION: 1. No acute intra-abdominal or pelvic pathology. No fluid collection or hematoma. 2. No bowel obstruction. 3. Cirrhosis. 4. Enlarged prostate gland with median lobe hypertrophy and associated mass effect and compression of the urinary bladder. 5. Aortic Atherosclerosis (ICD10-I70.0). Electronically Signed   By: Anner Crete M.D.   On: 10/11/2019 22:45    Procedures Procedures (including critical care time)  Medications Ordered in ED Medications  iohexol (OMNIPAQUE) 300 MG/ML solution 100 mL (100 mLs Intravenous Contrast Given 10/11/19 2149)    ED Course  I have reviewed the triage vital signs and the nursing notes.  Pertinent labs & imaging results that were available during  my care of the patient were reviewed by me and considered in my medical decision making (see chart for  details).    MDM Rules/Calculators/A&P                        [name] [name]. is a 74 y.o. male with pertinent past medical history of hypertension, BPH, anemia, who presents to the emergency department today for his Foley catheter leaking.  Nurse irrigated catheter and it was found to be patent.  Patient is not complaining of any suprapubic pain, abdominal tenderness, abdominal distention, any pain in general, no fevers or chills. On PE had some erythema overlying surgical site, will obtain CT to make sure  No deep tissue infection.  Patient is not having any infectious signs.  Vitals are stable.  Suprapubic catheter capped. Jp draining serous fluid from surgical site, overlying erythema noted. No tenderness to any of the drain sites, no suprapubic tenderness.   Dr. Sabra Heck consulted urology, Dr. Louis Meckel spoke to at 8:21 pm who recommended discharge with urology follow up if CT negative. Expressed to not change catheter at this time.  Labs interpreted by me showed stable CMP and CBC. Hgb 7.7 was 7.9 4 days ago, think that this is his baseline. No anemia symptoms. CT was negative.   .Doubt need for further emergent work up at this time. I explained the diagnosis and have given explicit precautions to return to the ER including for any other new or worsening symptoms. The patient understands and accepts the medical plan as it's been dictated and I have answered their questions. Discharge instructions concerning home care and prescriptions have been given. The patient is STABLE and is discharged to home in good condition. Will Discharge on Keflex due to overlying erythema. Explained to pt how to take this medication and to return if any side effects. Follow up with urology.   .I discussed this case with my attending physician, Dr. Sabra Heck, who cosigned this note including patient's presenting symptoms, physical exam, and planned diagnostics and interventions. Attending physician stated agreement with  plan or made changes to plan which were implemented.   Attending physician assessed patient at bedside.    Final Clinical Impression(s) / ED Diagnoses Final diagnoses:  Problem with Foley catheter, initial encounter [facility])    Rx / DC Orders ED Discharge Orders         Ordered    cephALEXin (KEFLEX) 500 MG capsule  3 times daily     10/11/19 2336           Alfredia Client, PA-C 10/12/19 Gladwin, Jahon Bart, PA-C 10/12/19 1454    Noemi Chapel, MD 10/13/19 1433

## 2019-10-11 NOTE — Discharge Instructions (Addendum)
Follow-up with Dr. Gloriann Loan with urology.  Your hemoglobin was 7.7 today, follow-up with your PCP for your hemoglobin.  This hemoglobin is why your hemoglobin was 3 days ago.  Come back to the emergency department for worsening symptoms, fevers, trouble urinating.

## 2019-10-11 NOTE — ED Notes (Signed)
Three-way indwelling foley cath irrigated using sterile water without any difficulties. Patient tolerated well. EDP notified.

## 2019-10-11 NOTE — ED Provider Notes (Signed)
This patient is a very complicated 74 year old male with a indwelling Foley catheter for the last 7 years.  Recently he had a very large 1 L in size blood clot in his bladder that required evacuation surgically.  He actually underwent a small exploratory laparotomy with an open cystostomy after having a suprapubic catheter placed at the beginning of April.  He was admitted 1 week ago and spent several days in the hospital.  He comes back today because of blood in his Foley catheter bag.  On exam the patient has no significant abdominal tenderness, he has a midline exploratory laparotomy scar in the midline with staples, there is a small amount of redness around this as well as a small amount of purulent drainage around the suprapubic catheter on the right.  There is a cystostomy tube connected to a Jackson-Pratt drain on the left.  His Foley catheter is seated in the urethra and there does appear to be hematuria in the bag.  This was easily irrigated.  The patient does not have any unstable vital signs but given his recent need for transfusion and now having more bleeding in the bladder he will need a repeat CBC.  Additionally given the redness around the surgical sites we will get some imaging to make sure there is no deep tissue infection after the surgery.  We will discuss with urology when results have returned, he appears hemodynamically stable.  Vitals:   10/11/19 1617  BP: 122/61  Pulse: 87  Resp: 14  Temp: 98.4 F (36.9 C)  TempSrc: Oral  SpO2: 98%   Medical screening examination/treatment/procedure(s) were conducted as a shared visit with non-physician practitioner(s) and myself.  I personally evaluated the patient during the encounter.  Clinical Impression:   Final diagnoses:  Problem with Foley catheter, initial encounter [facility])         Noemi Chapel, MD 10/13/19 1432

## 2019-10-11 NOTE — ED Notes (Signed)
Pt transported to CT ?

## 2019-10-11 NOTE — ED Triage Notes (Signed)
Pt reports leaking urine around foley catheter.   FC 64F 30cc

## 2019-10-11 NOTE — Progress Notes (Signed)
Patient had approximately 90 cc Omnipaque 300 extravasate in right upper arm. Dr. Quintella Reichert (Radiologist) checked patient's arm. Routine post extravasation orders placed. RN  And PA advised. IV removed and patient was scanned without contrast per PA order and returned to ED.

## 2019-10-11 NOTE — ED Notes (Signed)
Difficulty starting IV. Placed consult with IV team. Delay in patient care discussed with PA

## 2019-10-11 NOTE — ED Notes (Signed)
IV removed by CT tech. IV infiltrated. Swelling present in pt's right upper arm.

## 2019-10-11 NOTE — ED Notes (Signed)
IV team at bedside 

## 2019-10-13 ENCOUNTER — Telehealth: Payer: Self-pay

## 2019-10-13 ENCOUNTER — Other Ambulatory Visit: Payer: Self-pay

## 2019-10-13 DIAGNOSIS — N35912 Unspecified bulbous urethral stricture, male: Secondary | ICD-10-CM | POA: Diagnosis not present

## 2019-10-13 DIAGNOSIS — T83091D Other mechanical complication of indwelling urethral catheter, subsequent encounter: Secondary | ICD-10-CM | POA: Diagnosis not present

## 2019-10-13 DIAGNOSIS — R338 Other retention of urine: Secondary | ICD-10-CM | POA: Diagnosis not present

## 2019-10-13 DIAGNOSIS — N138 Other obstructive and reflux uropathy: Secondary | ICD-10-CM | POA: Diagnosis not present

## 2019-10-13 NOTE — Telephone Encounter (Signed)
He does not have hx of CHF or diabetes, may need to be evaluated for this.  Would recommend compression hose if he is able to place them and remove them appropriately.

## 2019-10-13 NOTE — Telephone Encounter (Signed)
[name] [name] a physical therapist with [facility] wanted to leave a message for Dr. Mariea Clonts:  Evaluation performed after recent hospitalization. Recommend one time a week for 4 weeks. Patient has + 2 lower extremity edema. Patient reports reduced sensation in feet. Does he have a diagnoses of CHF or diabetes? Should he be wearing compression hose? Therapist can be reached at [phone]. Routing to Dr. Mariea Clonts.

## 2019-10-13 NOTE — Consult Note (Signed)
Multiple ED visits in the past 30 days, high risk for unplanned admission in the [place].  Will having nursing assigned.  Natividad Brood, RN BSN [facility] Liaison  [phone] business mobile phone Toll free office [phone]  Fax number: [phone] [email or web address]

## 2019-10-14 DIAGNOSIS — R338 Other retention of urine: Secondary | ICD-10-CM | POA: Diagnosis not present

## 2019-10-14 DIAGNOSIS — T83091D Other mechanical complication of indwelling urethral catheter, subsequent encounter: Secondary | ICD-10-CM | POA: Diagnosis not present

## 2019-10-14 DIAGNOSIS — N35912 Unspecified bulbous urethral stricture, male: Secondary | ICD-10-CM | POA: Diagnosis not present

## 2019-10-14 DIAGNOSIS — N138 Other obstructive and reflux uropathy: Secondary | ICD-10-CM | POA: Diagnosis not present

## 2019-10-14 LAB — CULTURE, BLOOD (ROUTINE X 2)
Culture: NO GROWTH
Culture: NO GROWTH
Special Requests: ADEQUATE

## 2019-10-15 DIAGNOSIS — N138 Other obstructive and reflux uropathy: Secondary | ICD-10-CM | POA: Diagnosis not present

## 2019-10-15 DIAGNOSIS — T83091D Other mechanical complication of indwelling urethral catheter, subsequent encounter: Secondary | ICD-10-CM | POA: Diagnosis not present

## 2019-10-15 DIAGNOSIS — N35912 Unspecified bulbous urethral stricture, male: Secondary | ICD-10-CM | POA: Diagnosis not present

## 2019-10-15 DIAGNOSIS — R338 Other retention of urine: Secondary | ICD-10-CM | POA: Diagnosis not present

## 2019-10-16 ENCOUNTER — Telehealth: Payer: Self-pay | Admitting: *Deleted

## 2019-10-16 NOTE — Telephone Encounter (Signed)
Clair Gulling with [facility] called requesting orders for OT for ADL's and Low Vision Teachings.  Verbal orders given.

## 2019-10-17 ENCOUNTER — Ambulatory Visit (INDEPENDENT_AMBULATORY_CARE_PROVIDER_SITE_OTHER): Payer: Medicare Other | Admitting: Nurse Practitioner

## 2019-10-17 ENCOUNTER — Other Ambulatory Visit: Payer: Self-pay

## 2019-10-17 ENCOUNTER — Encounter: Payer: Self-pay | Admitting: Nurse Practitioner

## 2019-10-17 VITALS — BP 140/90 | HR 54 | Temp 97.5°F | Ht 67.0 in | Wt 198.0 lb

## 2019-10-17 DIAGNOSIS — N401 Enlarged prostate with lower urinary tract symptoms: Secondary | ICD-10-CM | POA: Diagnosis not present

## 2019-10-17 DIAGNOSIS — D509 Iron deficiency anemia, unspecified: Secondary | ICD-10-CM | POA: Diagnosis not present

## 2019-10-17 DIAGNOSIS — R338 Other retention of urine: Secondary | ICD-10-CM | POA: Diagnosis not present

## 2019-10-17 DIAGNOSIS — L539 Erythematous condition, unspecified: Secondary | ICD-10-CM | POA: Diagnosis not present

## 2019-10-17 DIAGNOSIS — Z9359 Other cystostomy status: Secondary | ICD-10-CM | POA: Diagnosis not present

## 2019-10-17 LAB — CBC WITH DIFFERENTIAL/PLATELET
Absolute Monocytes: 632 cells/uL (ref 200–950)
Basophils Absolute: 59 cells/uL (ref 0–200)
Basophils Relative: 0.5 %
Eosinophils Absolute: 257 cells/uL (ref 15–500)
Eosinophils Relative: 2.2 %
HCT: 26.6 % — ABNORMAL LOW (ref 38.5–50.0)
Hemoglobin: 8.3 g/dL — ABNORMAL LOW (ref 13.2–17.1)
Lymphs Abs: 1065 cells/uL (ref 850–3900)
MCH: 24.3 pg — ABNORMAL LOW (ref 27.0–33.0)
MCHC: 31.2 g/dL — ABNORMAL LOW (ref 32.0–36.0)
MCV: 78 fL — ABNORMAL LOW (ref 80.0–100.0)
MPV: 10.3 fL (ref 7.5–12.5)
Monocytes Relative: 5.4 %
Neutro Abs: 9688 cells/uL — ABNORMAL HIGH (ref 1500–7800)
Neutrophils Relative %: 82.8 %
Platelets: 459 10*3/uL — ABNORMAL HIGH (ref 140–400)
RBC: 3.41 10*6/uL — ABNORMAL LOW (ref 4.20–5.80)
RDW: 20.3 % — ABNORMAL HIGH (ref 11.0–15.0)
Total Lymphocyte: 9.1 %
WBC: 11.7 10*3/uL — ABNORMAL HIGH (ref 3.8–10.8)

## 2019-10-17 NOTE — Progress Notes (Signed)
Careteam: Patient Care Team: [name], NP as PCP - General (Geriatric Medicine) Barbaraann Faster, RN as [place] Management  PLACE OF SERVICE:  [place] Directive information    No Known Allergies  Chief Complaint  Patient presents with  . Follow-up    Emergency room follow-up, seen for problems with foley catheter     HPI: Patient is a 74 y.o. male to follow up ED visit for leaking catheter.  Nurse irrigated catheter and capped suprapubic catheter. Catheter was patent. Urology was consulted as well.   Pt recently was hospitalized from 4/29-5/5, he went to ED with ongoing hematuria and he had had multiple visits for catheter obstruction. Pt presented again with gross hematuria and catheter obstruction with ABLA with Hgb 7.1 and AKI, taken to OR by urology with complex surgery requiring exploratory laparotomy with cystotomy, bladder clot evacuation, open suprapubic tube placement, urethral dilation in the setting of bulbar urethral stricture done on 10/03/2019.Large 1L clot evacuated with ongoing active bleeding requiring multiple blood products, then transferred to IR for urgent bilateral prostatic artery embolization on 10/03/2019.Patient was then transferred to ICU on mechanical ventilation, Patient was eventually stabilized, self extubated on 10/04/2019. He has not followed up with urology after this hospitalization. He still has staples intact from surgery. When he went to ED due to leaking catheter it was noted that he had erythema around surgical incision. He had a CT to rule out deep tissue infection. CBC and CT stable, he was started on cephalexin 500 mg TID due to erythema but only taking twice daily.   Denies increase in pain or tenderness. He keeps saying "I am healing, I am healing"  No fevers or chills. Reports overall feeling much better.    Review of Systems:  Review of Systems  Constitutional: Negative for chills, fever and  weight loss.  Eyes:       Blind   Respiratory: Negative for cough, sputum production and shortness of breath.   Cardiovascular: Negative for chest pain, palpitations and leg swelling.  Gastrointestinal: Negative for abdominal pain, constipation, diarrhea and heartburn.  Genitourinary:       Foley catheter  Musculoskeletal: Negative for back pain, falls, joint pain and myalgias.  Skin: Negative.   Neurological: Positive for weakness. Negative for dizziness and headaches.  Psychiatric/Behavioral: Negative for depression and memory loss. The patient does not have insomnia.    Past Medical History:  Diagnosis Date  . Anemia 10/02/2019   DUE TO BLOOD LOSS  . Blind   . Disorder of bone and cartilage, unspecified   . Edema   . Elevated prostate specific antigen (PSA)   . Hypertension   . Hypopotassemia   . Nonspecific abnormal results of liver function study   . Unspecified glaucoma(365.9)    Past Surgical History:  Procedure Laterality Date  . CYSTOSCOPY WITH DIRECT VISION INTERNAL URETHROTOMY N/A 10/02/2019   Procedure: CYSTOSCOPY WITH DIRECT VISION INTERNAL URETHROTOMY WITH CLOT EVACUATION retrograd urethralgram Coverted to Open.;  Surgeon: Lucas Mallow, MD;  Location: [facility];  Service: Urology;  Laterality: N/A;  . CYSTOSCOPY WITH FULGERATION N/A 01/17/2018   Procedure: CYSTOSCOPY CLOT EVACUATION WITH FULGERATION TUR BIOPSY OF BLADDER NECK;  Surgeon: Irine Seal, MD;  Location: [facility];  Service: Urology;  Laterality: N/A;  . CYSTOSTOMY N/A 10/02/2019   Procedure: Open Cystostomy Suprapubic tube placement and JP drain and urethral dilation;  Surgeon: Lucas Mallow, MD;  Location: [facility];  Service: Urology;  Laterality: N/A;  . EYE SURGERY Bilateral 2009   for glaucoma  . EYE SURGERY Left 2014  . HERNIA REPAIR Right 2003   Dr. Jeraldine Loots  . IR ANGIOGRAM EXTREMITY RIGHT  10/03/2019  . IR ANGIOGRAM PELVIS SELECTIVE OR SUPRASELECTIVE  10/03/2019  . IR ANGIOGRAM PELVIS  SELECTIVE OR SUPRASELECTIVE  10/03/2019  . IR ANGIOGRAM SELECTIVE EACH ADDITIONAL VESSEL  10/03/2019  . IR ANGIOGRAM SELECTIVE EACH ADDITIONAL VESSEL  10/03/2019  . IR ANGIOGRAM SELECTIVE EACH ADDITIONAL VESSEL  10/03/2019  . IR EMBO ART  VEN HEMORR LYMPH EXTRAV  INC GUIDE ROADMAPPING  10/03/2019  . IR US GUIDE VASC ACCESS RIGHT  10/03/2019  . LAPAROSCOPIC APPENDECTOMY N/A 12/24/2012   Procedure:  LAPAROSCOPIC APPENDECTOMY ;  Surgeon: Pedro Earls, MD;  Location[facility];  Service: General;  Laterality: N/A;  . TRANSURETHRAL RESECTION OF PROSTATE N/A 05/27/2019   Procedure: Cystoscopy Clot Evacuation and Fulgration;  Surgeon: Ardis Hughs, MD;  Location: [facility];  Service: Urology;  Laterality: N/A;   Social History:   reports that he quit smoking about 51 years ago. His smoking use included cigarettes. He has never used smokeless tobacco. He reports that he does not drink alcohol or use drugs.  Family History  Problem Relation Age of Onset  . Kidney failure Mother     Medications: Patient's Medications  New Prescriptions   No medications on file  Previous Medications   ASCORBIC ACID (VITAMIN C WITH ROSE HIPS) 1000 MG TABLET    Take 1,000 mg by mouth daily with lunch.   ASPIRIN-ACETAMINOPHEN-CAFFEINE (EXCEDRIN MIGRAINE) 250-250-65 MG TABLET    Take 1 tablet by mouth daily as needed (pain).   BRIMONIDINE (ALPHAGAN) 0.2 % OPHTHALMIC SOLUTION    Place 1 drop into both eyes 2 (two) times daily.   CEPHALEXIN (KEFLEX) 500 MG CAPSULE    Take 1 capsule (500 mg total) by mouth 3 (three) times daily for 7 days.   DORZOLAMIDE-TIMOLOL (COSOPT) 22.3-6.8 MG/ML OPHTHALMIC SOLUTION    APPOINTMENT OVERDUE, instill 1 drop in both eyes twice daily   FOLIC ACID (FOLVITE) Q000111Q MCG TABLET    Take 800 mcg by mouth as directed. Once a week or periodically   HYDROCHLOROTHIAZIDE (MICROZIDE) 12.5 MG CAPSULE    Take 1 capsule (12.5 mg total) by mouth daily.   MAGNESIUM 250 MG TABS    Take 250 mg by mouth daily  as needed (migraine headache).    METOPROLOL TARTRATE (LOPRESSOR) 25 MG TABLET    Take 12.5 mg by mouth 2 (two) times daily.   OXYBUTYNIN (DITROPAN) 5 MG TABLET    Take 1 tablet (5 mg total) by mouth every 8 (eight) hours as needed for bladder spasms.   PILOCARPINE (PILOCAR) 2 % OPHTHALMIC SOLUTION    Place 1 drop into the right eye every 6 (six) hours.    POTASSIUM CHLORIDE SA (KLOR-CON) 20 MEQ TABLET    Take 1 tablet (20 mEq total) by mouth daily.   PYRIDOXINE (VITAMIN B-6) 50 MG TABLET    Take 50 mg by mouth daily with lunch.   VITAMIN B-12 (CYANOCOBALAMIN) 1000 MCG TABLET    Take 1,000 mcg by mouth daily with lunch.  Modified Medications   No medications on file  Discontinued Medications   No medications on file    Physical Exam:  Vitals:   10/17/19 1143  BP: 140/90  Pulse: (!) 54  Temp: (!) 97.5 F (36.4 C)  TempSrc: Temporal  SpO2: 100%  Weight: 198 lb (89.8 kg)  Height: 5\' 7"  (1.702 m)   Body mass index is 31.01 kg/m. Wt Readings from Last 3 Encounters:  10/17/19 198 lb (89.8 kg)  10/09/19 200 lb 9.9 oz (91 kg)  10/08/19 200 lb 9.9 oz (91 kg)    Physical Exam Constitutional:      General: He is not in acute distress.    Appearance: He is well-developed. He is not diaphoretic.  HENT:     Head: Normocephalic and atraumatic.  Eyes:     Conjunctiva/sclera: Conjunctivae normal.     Pupils: Pupils are equal, round, and reactive to light.  Cardiovascular:     Rate and Rhythm: Normal rate and regular rhythm.     Heart sounds: Normal heart sounds.  Pulmonary:     Effort: Pulmonary effort is normal.     Breath sounds: Normal breath sounds.  Abdominal:     General: Bowel sounds are normal.     Palpations: Abdomen is soft.  Musculoskeletal:        General: No tenderness.     Cervical back: Normal range of motion and neck supple.     Right lower leg: Edema (1+) present.     Left lower leg: Edema (1+) present.  Skin:    General: Skin is warm and dry.           Comments: Surgical incision with mild erythema around staples, no drainage, odor or tenderness noted.   Neurological:     Mental Status: He is alert and oriented to person, place, and time.  Psychiatric:        Mood and Affect: Mood normal.     Labs reviewed: Basic Metabolic Panel: Recent Labs    10/02/19 1236 10/02/19 2335 10/03/19 2200 10/05/19 0252 10/05/19 0649 10/06/19 0232 10/06/19 1815 10/07/19 0243 10/08/19 0300 10/08/19 2127 10/11/19 2035  NA  --    < > 136   < >  --    < >  --    < > 136 137 137  K  --    < > 4.1   < >  --    < >  --    < > 4.3 4.1 3.7  CL  --    < > 105   < >  --    < >  --    < > 104 105 104  CO2  --    < > 23   < >  --    < >  --    < > 20* 20* 21*  GLUCOSE  --    < > 162*   < >  --    < >  --    < > 94 113* 107*  BUN  --    < > 25*   < >  --    < >  --    < > 17 18 14   CREATININE  --    < > 1.27*   < >  --    < >  --    < > 0.98 1.19 1.04  CALCIUM  --    < > 7.9*   < >  --    < >  --    < > 7.5* 8.0* 7.9*  MG 1.9  --  1.9  --  1.9  --  1.8  --   --   --   --   PHOS 5.0*  --   --   --   --   --   --   --   --   --   --  TSH 2.205  --   --   --   --   --   --   --   --   --   --    < > = values in this interval not displayed.   Liver Function Tests: Recent Labs    05/25/19 0207 07/02/19 1446 09/22/19 1221 10/02/19 0953 10/03/19 0502  AST 24   < > 42* 46* 29  ALT 20   < > 30 35 24  ALKPHOS 49  --   --  95 58  BILITOT 1.0   < > 0.5 0.8 1.4*  PROT 6.2*   < > 7.3 6.5 4.7*  ALBUMIN 3.1*  --   --  3.1* 2.2*   < > = values in this interval not displayed.   No results for input(s): LIPASE, AMYLASE in the last 8760 hours. No results for input(s): AMMONIA in the last 8760 hours. CBC: Recent Labs    09/22/19 1221 09/22/19 1221 10/02/19 0952 10/02/19 2335 10/08/19 2127 10/09/19 0310 10/11/19 2035  WBC 6.6   < > 13.2*   < > 11.9* 10.6* 10.8*  NEUTROABS 4,554  --  10.3*  --   --  8.4*  --   HGB 10.6*   < > 7.1*   < > 8.5* 8.3* 7.7*   HCT 37.7*   < > 24.6*   < > 28.3* 28.2* 25.7*  MCV 69.6*   < > 70.5*   < > 84.2 85.5 83.2  PLT 330   < > 361   < > 351 328 356   < > = values in this interval not displayed.   Lipid Panel: Recent Labs    07/02/19 1446  CHOL 191  HDL 40  TRIG 514*  CHOLHDL 4.8   TSH: Recent Labs    10/02/19 1236  TSH 2.205   A1C: No results found for: HGBA1C   Assessment/Plan 1. Benign prostatic hyperplasia with urinary retention S/p exploratory lap with open cystostomy, she has not followed up with urology, personally called and made follow up appt for him on 5/17, agreeable to time.  -continues with staples with erythema. Went to ED for evaluation and pt reports overall feels better. No tenderness or drainage noted.  Continues with jp drain with serous drainage. Continues with home health nursing.  - CBC with Differential/Platelet  2. Suprapubic catheter ([place]) -catheter capped. Has foley catheter draining amber colored urine.  - CBC with Differential/Platelet  3. Erythema -noted to surgical incision, he still has staples, continues on keflex, encouraged to take as prescribed (TID vs BID) until complete. Will follow up cbc to follow wbc -personally called urologist to notify and set up post-op follow up appt on 5/17 at 1:30 pm. Pt aware of appt time and place.  - CBC with Differential/Platelet  4. Iron deficiency anemia, unspecified iron deficiency anemia type -hgb trending down, s/p transfusion, will follow up CBC  5. LE edema -stable, continue to encouraged compression hose, elevation of LE and low sodium diet.   Next appt: 12/01/2019, sooner if needed  Carlos American. [place] [phone]

## 2019-10-17 NOTE — Telephone Encounter (Signed)
Left message on confidential voicemail for Clair Gulling.

## 2019-10-17 NOTE — Patient Instructions (Signed)
INCREASE ANTIBIOTIC TO THREE TIMES DAILY UNTIL COMPLETE  APPT AT UROLOGIST at 1:30 on Monday May 17 [address], [place], [place] [address]

## 2019-10-20 DIAGNOSIS — R339 Retention of urine, unspecified: Secondary | ICD-10-CM | POA: Diagnosis not present

## 2019-10-21 DIAGNOSIS — T83091D Other mechanical complication of indwelling urethral catheter, subsequent encounter: Secondary | ICD-10-CM | POA: Diagnosis not present

## 2019-10-21 DIAGNOSIS — R338 Other retention of urine: Secondary | ICD-10-CM | POA: Diagnosis not present

## 2019-10-21 DIAGNOSIS — N35912 Unspecified bulbous urethral stricture, male: Secondary | ICD-10-CM | POA: Diagnosis not present

## 2019-10-21 DIAGNOSIS — N138 Other obstructive and reflux uropathy: Secondary | ICD-10-CM | POA: Diagnosis not present

## 2019-10-22 ENCOUNTER — Other Ambulatory Visit: Payer: Self-pay | Admitting: *Deleted

## 2019-10-22 DIAGNOSIS — N138 Other obstructive and reflux uropathy: Secondary | ICD-10-CM | POA: Diagnosis not present

## 2019-10-22 DIAGNOSIS — R338 Other retention of urine: Secondary | ICD-10-CM | POA: Diagnosis not present

## 2019-10-22 DIAGNOSIS — T83091D Other mechanical complication of indwelling urethral catheter, subsequent encounter: Secondary | ICD-10-CM | POA: Diagnosis not present

## 2019-10-22 DIAGNOSIS — N35912 Unspecified bulbous urethral stricture, male: Secondary | ICD-10-CM | POA: Diagnosis not present

## 2019-10-22 NOTE — Patient Outreach (Addendum)
[facility]) Care Management  10/22/2019  Andrewjoseph Forrister. [birth year] [id number]   [facility] Telephone Assessment/Screen for post hospital complex care  Referral Date: 10/13/19 Referral Source: [facility] hospital liaison, Natividad Brood Referral Reason:  High ED utilizer in the united healthcare medicare [facility]): Please assign to [facility] RN CM care coordinator for complex care, PCP does TOC and disease management follow calls and support needs assess for further needs  Insurance:   Outreach attempt # 1 No answer. [facility] RN CM left HIPAA [facility] Portability and Accountability Act) compliant voicemail message along with CM's contact info.   Plan: [facility] RN CM sent an unsuccessful outreach letter and scheduled this patient for another call attempt within 4 -7 business days  Tyjay Galindo L. Lavina Hamman, RN, BSN, [place] Coordinator Office number [phone] Mobile number [phone]  Main [facility] number [phone] Fax number [phone]

## 2019-10-28 ENCOUNTER — Other Ambulatory Visit: Payer: Self-pay

## 2019-10-28 ENCOUNTER — Encounter: Payer: Self-pay | Admitting: *Deleted

## 2019-10-28 ENCOUNTER — Other Ambulatory Visit: Payer: Self-pay | Admitting: *Deleted

## 2019-10-28 DIAGNOSIS — R339 Retention of urine, unspecified: Secondary | ICD-10-CM | POA: Diagnosis not present

## 2019-10-28 DIAGNOSIS — R6889 Other general symptoms and signs: Secondary | ICD-10-CM | POA: Diagnosis not present

## 2019-10-28 NOTE — Patient Outreach (Signed)
[name]) Care Management  10/28/2019  Kashon Jarret. Nov 09, 1945 [id number]   [facility] Telephone Assessment/Screen for post hospital complex care  Referral Date: 10/13/19 Referral Source: [facility] hospital liaison, Natividad Brood Referral Reason:  High ED utilizer in the united healthcare medicare [facility]): Please assign to [place] care coordinator for complex care, PCP does TOC and disease management follow calls and support needs assess for further needs  Insurance:   Outreach attempt # 2 successful Patient is able to verify HIPAA ([place] and Accountability Act) identifiers, date of birth (DOB) and address Reviewed and addressed referral to [facility] with patient  Post hospital follow up  Transition of care services noted to be completed by primary care MD office staff  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   [facility] RN CM reviewed his united health care transportation resources to medical appointments  He reports [facility] is "down the street from me" He plans to contact [facility] for assist with "tidying" his home  He states he does not think he needs a [facility] SW referral at this time  He denies food insecurities and reports various reasons he does not use grocery deliver or delivered meals services  He was allowed time to ventilate his feelings He reports to prevent from getting unwanted, or rotten food he chooses his own  He does not like using [employer] transportation  services He makes his own transportation appointments     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 He reports he memorizes familiar information needed.  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 He has supports of [place] services He has some assistance from his brother  He reports receiving $1300/month   Conditions  acute kidney injury, Benign prostatic hyperplasia (BPH), urinary retentions, thrombocytosis, legally blind, edema, chest pain, hypochromic microcytic anemia, hypokalemia, severe primary open angle stage glaucoma, hematuria, senile nuclear sclerosis, right corneal scar,  DME cane  Falls none reported,   Appointments 12/22/19 & 07/05/20 [name] NP primary care provider (PCP) 01/30/20 & 02/12/20 [facility] [facility]    Plans  [facility] RN CM will follow up with Mr Prajapati within the next 7-14 days for further assessment Pt encouraged to return a call to [facility] RN CM prn  Routed note to MD [facility] CM Care Plan Problem One     Most Recent Value  Care Plan Problem One knowledge deficit of home care management of  Role Documenting the Problem One Care Management Telephonic Coordinator  Care Plan for Problem One Active  [facility] Long Term Goal  over the next 90 days patient will verbalize knowledge of home care for major medical conditions as evidence by verbalization during follow up outreach   [place] Goal Start Date 10/28/19  Interventions for Problem One Long Term Goal post hospital assessment,  answered questions discussed various community resources offered [facility] SW referral but services denied   [facility] CM Short Term Goal #1  over the next 31 days patient with be able to verbalize 2-3 symptoms to report to MD  [facility] CM Short Term Goal #1 Start Date 11/17/19  Interventions for Short Term Goal #1 assesed for worsening symptoms, encouraged outreach to pcp or specialist with increases symptoms, questions answered         Jann Ra L. Lavina Hamman, RN, BSN, [place] Coordinator Office number [phone] Mobile number [phone]  Main [facility] number [phone] Fax number [phone]

## 2019-10-29 DIAGNOSIS — N138 Other obstructive and reflux uropathy: Secondary | ICD-10-CM | POA: Diagnosis not present

## 2019-10-29 DIAGNOSIS — R338 Other retention of urine: Secondary | ICD-10-CM | POA: Diagnosis not present

## 2019-10-29 DIAGNOSIS — T83091D Other mechanical complication of indwelling urethral catheter, subsequent encounter: Secondary | ICD-10-CM | POA: Diagnosis not present

## 2019-10-29 DIAGNOSIS — N35912 Unspecified bulbous urethral stricture, male: Secondary | ICD-10-CM | POA: Diagnosis not present

## 2019-11-02 ENCOUNTER — Encounter (HOSPITAL_COMMUNITY): Payer: Self-pay | Admitting: Emergency Medicine

## 2019-11-02 ENCOUNTER — Emergency Department (HOSPITAL_COMMUNITY)
Admission: EM | Admit: 2019-11-02 | Discharge: 2019-11-03 | Disposition: A | Discharge status: Home / Self Care | Payer: Medicare Other | Attending: Emergency Medicine | Admitting: Emergency Medicine

## 2019-11-02 ENCOUNTER — Other Ambulatory Visit: Payer: Self-pay

## 2019-11-02 DIAGNOSIS — Z466 Encounter for fitting and adjustment of urinary device: Secondary | ICD-10-CM | POA: Diagnosis present

## 2019-11-02 DIAGNOSIS — R310 Gross hematuria: Secondary | ICD-10-CM

## 2019-11-02 DIAGNOSIS — Z435 Encounter for attention to cystostomy: Secondary | ICD-10-CM | POA: Insufficient documentation

## 2019-11-02 DIAGNOSIS — T83198A Other mechanical complication of other urinary devices and implants, initial encounter: Secondary | ICD-10-CM | POA: Diagnosis not present

## 2019-11-02 NOTE — ED Triage Notes (Signed)
Pt states he has a foley and is not empty enough.

## 2019-11-03 LAB — URINALYSIS, ROUTINE W REFLEX MICROSCOPIC
Bilirubin Urine: NEGATIVE
Glucose, UA: NEGATIVE mg/dL
Ketones, ur: NEGATIVE mg/dL
Nitrite: NEGATIVE
Protein, ur: 100 mg/dL — AB
RBC / HPF: >50 RBC/hpf — ABNORMAL HIGH (ref 0–5)
Specific Gravity, Urine: 1.006 (ref 1.005–1.030)
WBC, UA: >50 WBC/hpf — ABNORMAL HIGH (ref 0–5)
pH: 5 (ref 5.0–8.0)

## 2019-11-03 LAB — I-STAT CHEM 8, ED
BUN: 24 mg/dL — ABNORMAL HIGH (ref 8–23)
Calcium, Ion: 1.12 mmol/L — ABNORMAL LOW (ref 1.15–1.40)
Chloride: 102 mmol/L (ref 98–111)
Creatinine, Ser: 1.1 mg/dL (ref 0.61–1.24)
Glucose, Bld: 108 mg/dL — ABNORMAL HIGH (ref 70–99)
HCT: 27 % — ABNORMAL LOW (ref 39.0–52.0)
Hemoglobin: 9.2 g/dL — ABNORMAL LOW (ref 13.0–17.0)
Potassium: 4.1 mmol/L (ref 3.5–5.1)
Sodium: 138 mmol/L (ref 135–145)
TCO2: 24 mmol/L (ref 22–32)

## 2019-11-03 NOTE — ED Provider Notes (Signed)
[facility] EMERGENCY DEPARTMENT Provider Note  CSN: [id number] Arrival date & time: 11/02/19 2017  Chief Complaint(s) problem with foley.  HPI [name] [name]. is a 74 y.o. male with suprapubic cath who is still able to void "naturally" here for decreased voiding and output from foley stating if feels like something blocks in mid-emptying. States he might have blood in urine. Reports recent dysuria; called Urology office who Rxd Cipro on Friday. Denies abd pain, fever, chills, diarrhea.  HPI   Past Medical History Past Medical History:  Diagnosis Date  . Anemia 10/02/2019   DUE TO BLOOD LOSS  . Blind   . Disorder of bone and cartilage, unspecified   . Edema   . Elevated prostate specific antigen (PSA)   . Hypertension   . Hypopotassemia   . Nonspecific abnormal results of liver function study   . Unspecified glaucoma(365.9)    Patient Active Problem List   Diagnosis Date Noted  . Swelling of lower leg 10/02/2019  . AKI (acute kidney injury) ([place]) 05/25/2019  . BPH (benign prostatic hyperplasia) 05/25/2019  . Anemia due to blood loss, chronic   . Acute blood loss anemia 01/14/2018  . Hypokalemia 01/14/2018  . 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 Hematuria 12/23/2012  . Chest pain 12/23/2012  . Anemia due to blood loss, acute 12/23/2012  . Elevated prostate specific antigen (PSA)   . Hypertension   . Edema    Home Medication(s) Prior to Admission medications   Medication Sig Start Date End Date Taking? Authorizing Provider  Ascorbic Acid (VITAMIN C WITH ROSE HIPS) 1000 MG tablet Take 1,000 mg by mouth daily with lunch.    [provider]  aspirin-acetaminophen-caffeine (EXCEDRIN MIGRAINE) [phone] MG tablet Take 1 tablet by mouth daily as needed (pain).    [provider]  brimonidine (ALPHAGAN) 0.2 % ophthalmic solution Place 1 drop into both eyes 2 (two) times daily. 03/11/19   [provider]  dorzolamide-timolol (COSOPT)  22.3-6.8 MG/ML ophthalmic solution APPOINTMENT OVERDUE, instill 1 drop in both eyes twice daily 07/14/13   [name], NP  finasteride (PROSCAR) 5 MG tablet Take 5 mg by mouth daily. 10/28/19   [provider]  folic acid (FOLVITE) [id number] MCG tablet Take 800 mcg by mouth as directed. Once a week or periodically    [provider]  hydrochlorothiazide (MICROZIDE) 12.5 MG capsule Take 1 capsule (12.5 mg total) by mouth daily. 07/03/19   [name], NP  Magnesium 250 MG TABS Take 250 mg by mouth daily as needed (migraine headache).     [provider]  metoprolol tartrate (LOPRESSOR) 25 MG tablet Take 12.5 mg by mouth 2 (two) times daily.    [provider]  oxybutynin (DITROPAN) 5 MG tablet Take 1 tablet (5 mg total) by mouth every 8 (eight) hours as needed for bladder spasms. Patient not taking: Reported on 10/17/2019 10/08/19   Donne Hazel, MD  pilocarpine [facility]) 2 % ophthalmic solution Place 1 drop into the right eye every 6 (six) hours.  12/25/17   [provider]  potassium chloride SA (KLOR-CON) 20 MEQ tablet Take 1 tablet (20 mEq total) by mouth daily. 07/03/19   [name], NP  pyridOXINE (VITAMIN B-6) 50 MG tablet Take 50 mg by mouth daily with lunch.    [provider]  tamsulosin (FLOMAX) 0.4 MG CAPS capsule Take 0.4 mg by mouth daily. 10/28/19   [provider]  vitamin B-12 (CYANOCOBALAMIN) 1000 MCG  tablet Take 1,000 mcg by mouth daily with lunch.    [provider]                                                                                                                                    Past Surgical History Past Surgical History:  Procedure Laterality Date  . CYSTOSCOPY WITH DIRECT VISION INTERNAL URETHROTOMY N/A 10/02/2019   Procedure: CYSTOSCOPY WITH DIRECT VISION INTERNAL URETHROTOMY WITH CLOT EVACUATION retrograd urethralgram Coverted to Open.;  Surgeon: Lucas Mallow, MD;   Location: [place];  Service: Urology;  Laterality: N/A;  . CYSTOSCOPY WITH FULGERATION N/A 01/17/2018   Procedure: CYSTOSCOPY CLOT EVACUATION WITH FULGERATION TUR BIOPSY OF BLADDER NECK;  Surgeon: Irine Seal, MD;  Location[facility];  Service: Urology;  Laterality: N/A;  . CYSTOSTOMY N/A 10/02/2019   Procedure: Open Cystostomy Suprapubic tube placement and JP drain and urethral dilation;  Surgeon: Lucas Mallow, MD;  Location: [place];  Service: Urology;  Laterality: N/A;  . EYE SURGERY Bilateral 2009   for glaucoma  . EYE SURGERY Left 2014  . HERNIA REPAIR Right 2003   Dr. Jeraldine Loots  . IR ANGIOGRAM EXTREMITY RIGHT  10/03/2019  . IR ANGIOGRAM PELVIS SELECTIVE OR SUPRASELECTIVE  10/03/2019  . IR ANGIOGRAM PELVIS SELECTIVE OR SUPRASELECTIVE  10/03/2019  . IR ANGIOGRAM SELECTIVE EACH ADDITIONAL VESSEL  10/03/2019  . IR ANGIOGRAM SELECTIVE EACH ADDITIONAL VESSEL  10/03/2019  . IR ANGIOGRAM SELECTIVE EACH ADDITIONAL VESSEL  10/03/2019  . IR EMBO ART  VEN HEMORR LYMPH EXTRAV  INC GUIDE ROADMAPPING  10/03/2019  . IR US GUIDE VASC ACCESS RIGHT  10/03/2019  . LAPAROSCOPIC APPENDECTOMY N/A 12/24/2012   Procedure:  LAPAROSCOPIC APPENDECTOMY ;  Surgeon: Joevon Holliman Earls, MD;  Location[facility];  Service: General;  Laterality: N/A;  . TRANSURETHRAL RESECTION OF PROSTATE N/A 05/27/2019   Procedure: Cystoscopy Clot Evacuation and Fulgration;  Surgeon: Ardis Hughs, MD;  Location[facility];  Service: Urology;  Laterality: N/A;   Family History Family History  Problem Relation Age of Onset  . Kidney failure Mother     Social History Social History   Tobacco Use  . Smoking status: Former Smoker    Types: Cigarettes    Quit date: 06/05/1968    Years since quitting: 51.4  . Smokeless tobacco: Never Used  Substance Use Topics  . Alcohol use: No    Comment: STOPPED IN 1970S  . Drug use: No   Allergies Patient has no known allergies.  Review of Systems Review of Systems All other systems are  reviewed and are negative for acute change except as noted in the HPI  Physical Exam Vital Signs  I have reviewed the triage vital signs BP (!) 158/78   Pulse 75   Temp 98.6 F (37 C) (Oral)   Resp 16   SpO2 100%   Physical Exam Vitals reviewed.  Constitutional:      General: He is not in acute distress.    Appearance: He is well-developed. He is not diaphoretic.  HENT:     Head: Normocephalic and atraumatic.     Jaw: No trismus.     Right Ear: External ear normal.     Left Ear: External ear normal.     Nose: Nose normal.  Eyes:     General: No scleral icterus.    Conjunctiva/sclera: Conjunctivae normal.  Neck:     Trachea: Phonation normal.  Cardiovascular:     Rate and Rhythm: Normal rate and regular rhythm.  Pulmonary:     Effort: Pulmonary effort is normal. No respiratory distress.     Breath sounds: No stridor.  Abdominal:     General: There is no distension.     Tenderness: There is no abdominal tenderness.    Genitourinary:    Penis: Normal.      Testes: Normal.  Musculoskeletal:        General: Normal range of motion.     Cervical back: Normal range of motion.  Neurological:     Mental Status: He is alert and oriented to person, place, and time.  Psychiatric:        Behavior: Behavior normal.     ED Results and Treatments Labs (all labs ordered are listed, but only abnormal results are displayed) Labs Reviewed  URINALYSIS, ROUTINE W REFLEX MICROSCOPIC - Abnormal; Notable for the following components:      Result Value   APPearance CLOUDY (*)    Hgb urine dipstick LARGE (*)    Protein, ur 100 (*)    Leukocytes,Ua LARGE (*)    RBC / HPF >50 (*)    WBC, UA >50 (*)    Bacteria, UA FEW (*)    All other components within normal limits  I-STAT CHEM 8, ED - Abnormal; Notable for the following components:   BUN 24 (*)    Glucose, Bld 108 (*)    Calcium, Ion 1.12 (*)    Hemoglobin 9.2 (*)    HCT 27.0 (*)    All other components within normal limits   URINE CULTURE                                                                                                                         EKG  EKG Interpretation  Date/Time:    Ventricular Rate:    PR Interval:    QRS Duration:   QT Interval:    QTC Calculation:   R Axis:     Text Interpretation:        Radiology No results found.  Pertinent labs & imaging results that were available during my care of the patient were reviewed by me and considered in my medical decision making (see chart for details).  Medications Ordered in ED Medications - No data to display  Procedures Ultrasound ED Renal  Date/Time: 11/03/2019 2:40 AM Performed by: Fatima Blank, MD Authorized by: Fatima Blank, MD   Procedure details:    Indications: urinary retention     Technique:  BladderImages: archived Bladder findings:    Bladder:  Visualized   Free pelvic fluid: not identified     Post-void residual volume:  200cc    (including critical care time)  Medical Decision Making / ED Course I have reviewed the nursing notes for this encounter and the patient's prior records (if available in EHR or on provided paperwork).   [name] [name]. was evaluated in Emergency Department on 11/03/2019 for the symptoms described in the history of present illness. He was evaluated in the context of the global COVID-19 pandemic, which necessitated consideration that the patient might be at risk for infection with the SARS-CoV-2 virus that causes COVID-19. Institutional protocols and algorithms that pertain to the evaluation of patients at risk for COVID-19 are in a state of rapid change based on information released by regulatory bodies including the CDC and federal and state organizations. These policies and algorithms were followed during the patient's care in the  ED.  Patient with hematuria and +leukocytes w/o nitirites. Culture sent. Already on Cipro. ranal function intact. Able to void and drain approx 400cc.  Wounds are healing well. No evidence of superimposed infection.   Instructed to continue Cipro and call [facility] to follow up cultures.      Final Clinical Impression(s) / ED Diagnoses Final diagnoses:  Gross hematuria  Encounter for suprapubic catheter care [facility])   The patient appears reasonably screened and/or stabilized for discharge and I doubt any other medical condition or other [facility] requiring further screening, evaluation, or treatment in the ED at this time prior to discharge. Safe for discharge with strict return precautions.  Disposition: Discharge  Condition: Good  I have discussed the results, Dx and Tx plan with the patient/family who expressed understanding and agree(s) with the plan. Discharge instructions discussed at length. The patient/family was given strict return precautions who verbalized understanding of the instructions. No further questions at time of discharge.    ED Discharge Orders    None        Follow Up: Irine Seal, [place] [id number] [phone]         This chart was dictated using voice recognition software.  Despite best efforts to proofread,  errors can occur which can change the documentation meaning.   Fatima Blank, MD 11/03/19 [phone]

## 2019-11-04 LAB — URINE CULTURE: Culture: <10000 — AB

## 2019-11-05 DIAGNOSIS — N138 Other obstructive and reflux uropathy: Secondary | ICD-10-CM | POA: Diagnosis not present

## 2019-11-05 DIAGNOSIS — R338 Other retention of urine: Secondary | ICD-10-CM | POA: Diagnosis not present

## 2019-11-05 DIAGNOSIS — N35912 Unspecified bulbous urethral stricture, male: Secondary | ICD-10-CM | POA: Diagnosis not present

## 2019-11-05 DIAGNOSIS — T83091D Other mechanical complication of indwelling urethral catheter, subsequent encounter: Secondary | ICD-10-CM | POA: Diagnosis not present

## 2019-11-07 DIAGNOSIS — R6889 Other general symptoms and signs: Secondary | ICD-10-CM | POA: Diagnosis not present

## 2019-11-11 DIAGNOSIS — T83091D Other mechanical complication of indwelling urethral catheter, subsequent encounter: Secondary | ICD-10-CM | POA: Diagnosis not present

## 2019-11-11 DIAGNOSIS — R338 Other retention of urine: Secondary | ICD-10-CM | POA: Diagnosis not present

## 2019-11-11 DIAGNOSIS — N138 Other obstructive and reflux uropathy: Secondary | ICD-10-CM | POA: Diagnosis not present

## 2019-11-11 DIAGNOSIS — N35912 Unspecified bulbous urethral stricture, male: Secondary | ICD-10-CM | POA: Diagnosis not present

## 2019-11-17 ENCOUNTER — Other Ambulatory Visit: Payer: Self-pay | Admitting: *Deleted

## 2019-11-17 NOTE — Patient Outreach (Signed)
[facility]) Care Management  11/17/2019  Aldean Ast. [birth year] [id number]   Unsuccessful outreach for  [facility] Telephone Assessment/Screen forpost hospital complex care Referral Date:10/13/19 Referral Source:[facility] hospital liaison, Natividad Brood Referral Reason:High ED utilizer in the united healthcare medicare [facility]): Please assign to [place] care coordinator for complex care, PCP does TOC and disease management follow calls and support needs assess for further needs Insurance: united healthcare medicare   ED visit on 5/321/21 for gross hematuria reported symptoms ED MD request follow up with Dr Penni Homans attempt unsuccessful No answer. [facility] RN CM lwas not able to leave a  HIPAA ([place] and Accountability Act) compliant voicemail message along with CM's contact info as Mr Spragg does not have a mail box set up on his phone   Plan: [place] sent an unsuccessful outreach letter and scheduled this patient for another call attempt within 7 business days    Ricci Dirocco L. Lavina Hamman, RN, BSN, [place] Coordinator Office number [phone] Mobile number [phone]  Main [facility] number [phone] Fax number [phone]

## 2019-11-17 NOTE — Patient Outreach (Signed)
[facility]) Care Management  11/17/2019  Cormac Wint. 1945-12-18 [id number]   [facility] Telephone Assessment/Screen forpost hospital complex care Referral Date:10/13/19 Referral Source:[facility] hospital liaison, Natividad Brood Referral Reason:High ED utilizer in the united healthcare medicare [facility]): Please assign to [place] care coordinator for complex care, PCP does TOC and disease management follow calls and support needs assess for further needs Insurance: united healthcare medicare   ED visit on 5/321/21 for gross hematuria reported symptoms ED MD request follow up with Dr Jeffie Pollock  Patient returned a call to [facility] RN CM Patient is able to verify HIPAA, DOB and Address  ED follow up  He reports the catheter was removed and he did get an appointment with his urology  He denies further hematuria, abdominal pain, etc  Home care management  [facility] got in touch with him to help with cleaning his apartment Lorriane Shire) This staff arrives 2-4 days and also assists him with getting food  She takes him to get a hot meal at restaurants like [employer] Talked to several people about services   He denies concerns at this time  Got second covid shot on 11/07/19 and is doing well  Discussed recording and play back messages on his mobile number to use as reminders to home care tasks He reports he can ask  Lorriane Shire to check his straight talk legend phone for recording possibilities. He appreciates the suggestion  [facility] staff Langley Gauss still arrives to visit to check his vitals and assist with medicine container,  comes throughout the week  wt 195-198 lbs ht 5\' 8"    Service of the blind outreached  He wanted to get a magnifying glass but was encouraged to await his September 2021 eye appointment. [facility] RN CM discussed possible options for Mr Strohmeier and he will get his home care staff to assist him   Consent: [facility] RN CM reviewed [facility] services with patient.  Patient gave verbal consent for [facility] telephonic RN CM services.  Plans [facility] RN CM will follow up with Mr Mathers with the next 14-21 business days Pt encouraged to return a call to [facility] RN CM prn Routed note to MD [facility] CM Care Plan Problem One     Most Recent Value  Care Plan Problem One knowledge deficit of home care management of  Role Documenting the Problem One Care Management Telephonic Coordinator  Care Plan for Problem One Active  [facility] Long Term Goal  over the next 90 days patient will verbalize knowledge of home care for major medical conditions as evidence by verbalization during follow up outreach   [place] Term Goal Start Date 10/28/19  Interventions for Problem One Long Term Goal assessed for worsening symptoms, social determinate needs , suggestion for audio reminder system answered questions   [facility] CM Short Term Goal #1  over the next 31 days patient with be able to verbalize 2-3 symptoms to report to MD  [facility] CM Short Term Goal #1 Start Date 11/17/19  Interventions for Short Term Goal #1 assessed for worsening symtoms encourage MD outreach prn       Joelene Millin L. Lavina Hamman, RN, BSN, [place] Coordinator Office number [phone] Mobile number [phone]  Main [facility] number [phone] Fax number [phone]

## 2019-11-24 ENCOUNTER — Ambulatory Visit: Payer: Self-pay | Admitting: *Deleted

## 2019-12-01 ENCOUNTER — Ambulatory Visit: Payer: Self-pay | Admitting: *Deleted

## 2019-12-01 ENCOUNTER — Ambulatory Visit: Payer: Medicare Other | Admitting: Nurse Practitioner

## 2019-12-02 ENCOUNTER — Other Ambulatory Visit: Payer: Self-pay

## 2019-12-02 ENCOUNTER — Encounter: Payer: Self-pay | Admitting: *Deleted

## 2019-12-02 ENCOUNTER — Other Ambulatory Visit: Payer: Self-pay | Admitting: *Deleted

## 2019-12-02 NOTE — Patient Outreach (Signed)
[facility]) Care Management  12/02/2019  [name] [name]. 1945-11-26 [id number]   [facility] assessment of complex care patient  Referral Date:10/13/19 Referral Source:[facility] hospital liaison, Natividad Brood Referral Reason:High ED utilizer in the united healthcare medicare [facility]): Please assign to [place] care coordinator for complex care, PCP does TOC and disease management follow calls and support needs assess for further needs Insurance:united healthcare medicare   Patient returned a call to [facility] RN CM Patient is able to verify HIPAA, DOB and Address Reviewed with Mr Ingwersen the reason  Concerns today's He confirms he was concerned as he overdrafted his bank account to purchase a new vacuum cleaner, therefore has decreased funds until December 06 2019 He reports the [facility] staff was able to assist him with cleaning his apartment very well He reports being assisted to go to get breakfast  He reports a need of breads products [facility] RN CM discussed him asking if one of the [facility] aides could take him to one of the local pantries to pick a box of food he state he will ask and also can use his preferred transportation service He was text requested food pantry information for [employer] [place] [phone], pdy&f at [place] [address] [phone], [employer] food pantry [place] [address] [phone] and [employer] food pantry [place] [address] [phone]  Legs without edema and he is happy about that  Renewed his lease and paid online with assist of a staff at [employer] member  Use united healthcare transportation services  Got his 2nd covid   Hypertension  [facility] staff takes him for walks 2-3 times around the block He generally do not check his BP  But Langley Gauss from [place] checks with she visits  No BP cuff  Encouraged  him to have one of the [place] or [facility] staff help him order one from his united healthcare over the counter (OTC) benefit catalog in which he confirms he receives Wt 190 lbs ht 5'8" BMI 28.9 Discussed    Suprapubic catheter  Most of his urine output is now via the suprapubic catheter He reports his ED visits were to be evaluated for urinary retention and he was "hoping I could regain" output from his penis  Urinal Bag on left calf States he only sees drops of urine from his penis and not reports he is "okay with that"    Social Mr 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 He reports he memorizes familiar information needed.  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 He has supports of [place] services He has some assistance from his brother  He reports receiving $1300/month   Conditions  acute kidney injury, Benign prostatic hyperplasia (BPH), urinary retentions, thrombocytosis, legally blind, edema, chest pain, hypochromic microcytic anemia, hypokalemia, severe primary open angle stage glaucoma, hematuria, senile nuclear sclerosis, right corneal scar,  DME cane  Falls none reported,    Appointments 12/22/19 & 07/05/20 [name] NP primary care provider (PCP) 01/30/20 & 02/12/20 [place] [facility]   Consent: [facility] RN CM reviewed [facility] services with patient. Patient gave verbal consent for [facility] telephonic RN  CM services.  Plans [facility] RN CM will follow up with Mr Pryde within the next 30 days to further assess and provide disease management services for HTN, urinary retention  Pt encouraged to return a call to [facility] RN CM prn Routed note to MD   [facility] CM Care Plan Problem One     Most Recent Value  Care Plan Problem One knowledge deficit of home care management of  Role Documenting the Problem One Care  Management Telephonic Coordinator  Care Plan for Problem One Active  [facility] Long Term Goal  over the next 90 days patient will verbalize knowledge of home care for major medical conditions HTN, edema and suprapubic catheter as evidence by verbalization during follow up outreach   [place] Term Goal Start Date 10/28/19  Interventions for Problem One Long Term Goal assessed home care of HTN, suprapubic catheter, review BP WNL values, answered questions assessed for BP cuff, care of catheter, encouraged purchase of BP cuff an supplies from [facility] OTC catalog discussed wt/ht/bmisent EMMI for HTn, suprapubic catheter care, Edema urinary retention  [facility] CM Short Term Goal #1  over the next 31 days patient with be able to verbalize 2-3 symptoms to report to MD  [facility] CM Short Term Goal #1 Start Date 11/17/19  Interventions for Short Term Goal #1 assessed for knowledge of home care for HTN, EMMIs sent  [facility] CM Short Term Goal #2  over the next 30 days patient will reports receiving assist from the local food pantries during outreach   [facility] CM Short Term Goal #2 Start Date 12/02/19  Interventions for Short Term Goal #2 provided with a list of local food pantry addresses and numbers, encouraged him to visit via local transport or [facility] aide     Gala Padovano L. Lavina Hamman, RN, BSN, [place] Coordinator Office number [phone] Mobile number [phone]  Main [facility] number [phone] Fax number [phone]

## 2019-12-02 NOTE — Patient Outreach (Signed)
[facility] [facility]) Care Management  12/02/2019  Aldean Ast. 1945/07/30 [id number]   Unsuccessful outreach  Outreach attempt unsuccessful No answer. [facility] RN CM unable to leave a HIPAA ([place] and Accountability Act) compliant voicemail message along with CM's contact info as Mr Defelice voice mail box is not set.   Plan: [facility] RN CM sent an unsuccessful outreach letter and scheduled this patient for another call attempt within 4 business days  Domenique Southers L. Lavina Hamman, RN, BSN, [place] Coordinator Office number [phone] Mobile number [phone]  Main [facility] number [phone] Fax number [phone]

## 2019-12-22 ENCOUNTER — Ambulatory Visit (INDEPENDENT_AMBULATORY_CARE_PROVIDER_SITE_OTHER): Payer: Medicare Other | Admitting: Nurse Practitioner

## 2019-12-22 ENCOUNTER — Other Ambulatory Visit: Payer: Self-pay

## 2019-12-22 ENCOUNTER — Encounter: Payer: Self-pay | Admitting: Nurse Practitioner

## 2019-12-22 VITALS — BP 122/84 | HR 79 | Temp 96.8°F | Ht 68.0 in | Wt 195.2 lb

## 2019-12-22 DIAGNOSIS — N401 Enlarged prostate with lower urinary tract symptoms: Secondary | ICD-10-CM | POA: Diagnosis not present

## 2019-12-22 DIAGNOSIS — D509 Iron deficiency anemia, unspecified: Secondary | ICD-10-CM

## 2019-12-22 DIAGNOSIS — I10 Essential (primary) hypertension: Secondary | ICD-10-CM | POA: Diagnosis not present

## 2019-12-22 DIAGNOSIS — R338 Other retention of urine: Secondary | ICD-10-CM

## 2019-12-22 DIAGNOSIS — Z9359 Other cystostomy status: Secondary | ICD-10-CM | POA: Diagnosis not present

## 2019-12-22 DIAGNOSIS — R768 Other specified abnormal immunological findings in serum: Secondary | ICD-10-CM | POA: Diagnosis not present

## 2019-12-22 MED ORDER — METOPROLOL TARTRATE 25 MG PO TABS: 12.5000 mg | ORAL_TABLET | Freq: Two times a day (BID) | ORAL | 1 refills | Status: DC

## 2019-12-22 NOTE — Progress Notes (Signed)
Careteam: Patient Care Team: [name], NP as PCP - General (Geriatric Medicine) Barbaraann Faster, RN as [place]:  [place] Directive information Does Patient Have a Medical Advance Directive?: No, Would patient like information on creating a medical advance directive?: No - Patient declined  No Known Allergies  Chief Complaint  Patient presents with  . Medical Management of Chronic Issues    3 month follow-up. FYI will see urologist on 01/14/2020  . Medication Management    Discuss Metoprolol, need refill. Patient had pill cut into 3 sections to make it last      HPI: Patient is a 74 y.o. male for routine follow up.   BPH with obstruction- continues on proscar and flomaxMissed last urologist appt but rescheduled for 8/11. Continues with suprapubic catheter and urine flowing well from this area.   Swelling to legs, ankles and feet have improved.   Continues to work on weight loss.   Pt with hep c- he is unsure why he refused referral. Reports "I was likely confused"   Anemia- stable on recent labs. No further bleeding. Not on iron supplement at this time. Declines blood draw today.   Reports he is reporting better to things now than in the past. Reports he was noncompliant in the past but now planning to come to appts   htn- controlled on metoprolol and hctz  Review of Systems:  Review of Systems  Constitutional: Negative for chills, fever and weight loss.  HENT: Negative for tinnitus.   Respiratory: Negative for cough, sputum production and shortness of breath.   Cardiovascular: Negative for chest pain, palpitations and leg swelling.  Gastrointestinal: Negative for abdominal pain, constipation, diarrhea and heartburn.  Genitourinary:       Supra pubic   Musculoskeletal: Negative for back pain, falls, joint pain and myalgias.  Skin: Negative.   Neurological: Negative for dizziness and  headaches.  Psychiatric/Behavioral: Negative for depression. The patient does not have insomnia.     Past Medical History:  Diagnosis Date  . Anemia 10/02/2019   DUE TO BLOOD LOSS  . Blind   . Disorder of bone and cartilage, unspecified   . Edema   . Elevated prostate specific antigen (PSA)   . Hypertension   . Hypopotassemia   . Nonspecific abnormal results of liver function study   . Unspecified glaucoma(365.9)    Past Surgical History:  Procedure Laterality Date  . CYSTOSCOPY WITH DIRECT VISION INTERNAL URETHROTOMY N/A 10/02/2019   Procedure: CYSTOSCOPY WITH DIRECT VISION INTERNAL URETHROTOMY WITH CLOT EVACUATION retrograd urethralgram Coverted to Open.;  Surgeon: Lucas Mallow, MD;  Location: [place];  Service: Urology;  Laterality: N/A;  . CYSTOSCOPY WITH FULGERATION N/A 01/17/2018   Procedure: CYSTOSCOPY CLOT EVACUATION WITH FULGERATION TUR BIOPSY OF BLADDER NECK;  Surgeon: Irine Seal, MD;  [facility];  Service: Urology;  Laterality: N/A;  . CYSTOSTOMY N/A 10/02/2019   Procedure: Open Cystostomy Suprapubic tube placement and JP drain and urethral dilation;  Surgeon: Lucas Mallow, MD;  Location: [place];  Service: Urology;  Laterality: N/A;  . EYE SURGERY Bilateral 2009   for glaucoma  . EYE SURGERY Left 2014  . HERNIA REPAIR Right 2003   Dr. Jeraldine Loots  . IR ANGIOGRAM EXTREMITY RIGHT  10/03/2019  . IR ANGIOGRAM PELVIS SELECTIVE OR SUPRASELECTIVE  10/03/2019  . IR ANGIOGRAM PELVIS SELECTIVE OR SUPRASELECTIVE  10/03/2019  . IR ANGIOGRAM SELECTIVE  EACH ADDITIONAL VESSEL  10/03/2019  . IR ANGIOGRAM SELECTIVE EACH ADDITIONAL VESSEL  10/03/2019  . IR ANGIOGRAM SELECTIVE EACH ADDITIONAL VESSEL  10/03/2019  . IR EMBO ART  VEN HEMORR LYMPH EXTRAV  INC GUIDE ROADMAPPING  10/03/2019  . IR US GUIDE VASC ACCESS RIGHT  10/03/2019  . LAPAROSCOPIC APPENDECTOMY N/A 12/24/2012   Procedure:  LAPAROSCOPIC APPENDECTOMY ;  Surgeon: Pedro Earls, MD;  Location[facility];  Service:  General;  Laterality: N/A;  . TRANSURETHRAL RESECTION OF PROSTATE N/A 05/27/2019   Procedure: Cystoscopy Clot Evacuation and Fulgration;  Surgeon: Ardis Hughs, MD;  Location[facility];  Service: Urology;  Laterality: N/A;   Social History:   reports that he quit smoking about 51 years ago. His smoking use included cigarettes. He has never used smokeless tobacco. He reports that he does not drink alcohol and does not use drugs.  Family History  Problem Relation Age of Onset  . Kidney failure Mother     Medications: Patient's Medications  New Prescriptions   No medications on file  Previous Medications   ASCORBIC ACID (VITAMIN C WITH ROSE HIPS) 1000 MG TABLET    Take 1,000 mg by mouth daily with lunch.   ASPIRIN-ACETAMINOPHEN-CAFFEINE (EXCEDRIN MIGRAINE) 250-250-65 MG TABLET    Take 1 tablet by mouth daily as needed (pain).   BRIMONIDINE (ALPHAGAN) 0.2 % OPHTHALMIC SOLUTION    Place 1 drop into both eyes 2 (two) times daily.   DORZOLAMIDE-TIMOLOL (COSOPT) 22.3-6.8 MG/ML OPHTHALMIC SOLUTION    APPOINTMENT OVERDUE, instill 1 drop in both eyes twice daily   FINASTERIDE (PROSCAR) 5 MG TABLET    Take 5 mg by mouth daily.   FOLIC ACID (FOLVITE) 825 MCG TABLET    Take 800 mcg by mouth as directed. Once a week or periodically   HYDROCHLOROTHIAZIDE (MICROZIDE) 12.5 MG CAPSULE    Take 1 capsule (12.5 mg total) by mouth daily.   MAGNESIUM 250 MG TABS    Take 250 mg by mouth daily as needed (migraine headache).    METOPROLOL TARTRATE (LOPRESSOR) 25 MG TABLET    Take 12.5 mg by mouth 2 (two) times daily.   PILOCARPINE (PILOCAR) 2 % OPHTHALMIC SOLUTION    Place 1 drop into the right eye every 6 (six) hours.    POTASSIUM CHLORIDE SA (KLOR-CON) 20 MEQ TABLET    Take 1 tablet (20 mEq total) by mouth daily.   PYRIDOXINE (VITAMIN B-6) 50 MG TABLET    Take 50 mg by mouth daily with lunch.   TAMSULOSIN (FLOMAX) 0.4 MG CAPS CAPSULE    Take 0.4 mg by mouth daily.   VITAMIN B-12 (CYANOCOBALAMIN) 1000 MCG  TABLET    Take 1,000 mcg by mouth daily with lunch.  Modified Medications   No medications on file  Discontinued Medications   OXYBUTYNIN (DITROPAN) 5 MG TABLET    Take 1 tablet (5 mg total) by mouth every 8 (eight) hours as needed for bladder spasms.    Physical Exam:  Vitals:   12/22/19 1122  BP: 122/84  Pulse: 79  Temp: (!) 96.8 F (36 C)  TempSrc: Temporal  SpO2: 99%  Weight: 195 lb 3.2 oz (88.5 kg)  Height: 5\' 8"  (1.727 m)   Body mass index is 29.68 kg/m. Wt Readings from Last 3 Encounters:  12/22/19 195 lb 3.2 oz (88.5 kg)  12/02/19 190 lb (86.2 kg)  10/17/19 198 lb (89.8 kg)    Physical Exam Constitutional:      General: He is not  in acute distress.    Appearance: He is well-developed. He is not diaphoretic.  HENT:     Head: Normocephalic and atraumatic.     Mouth/Throat:     Pharynx: No oropharyngeal exudate.  Eyes:     Conjunctiva/sclera: Conjunctivae normal.     Pupils: Pupils are equal, round, and reactive to light.  Cardiovascular:     Rate and Rhythm: Normal rate and regular rhythm.     Heart sounds: Normal heart sounds.  Pulmonary:     Effort: Pulmonary effort is normal.     Breath sounds: Normal breath sounds.  Abdominal:     General: Abdomen is protuberant. Bowel sounds are normal.     Palpations: Abdomen is soft.     Tenderness: There is no abdominal tenderness.     Comments: subrapubic catheter   Musculoskeletal:        General: No tenderness.     Cervical back: Normal range of motion and neck supple.  Skin:    General: Skin is warm and dry.  Neurological:     Mental Status: He is alert and oriented to person, place, and time.  Psychiatric:        Mood and Affect: Mood normal.        Behavior: Behavior normal.     Labs reviewed: Basic Metabolic Panel: Recent Labs    10/02/19 1236 10/02/19 2335 10/03/19 2200 10/05/19 0252 10/05/19 0649 10/06/19 0232 10/06/19 1815 10/07/19 0243 10/08/19 0300 10/08/19 0300 10/08/19 2127  10/11/19 2035 11/03/19 0057  NA  --    < > 136   < >  --    < >  --    < > 136   < > 137 137 138  K  --    < > 4.1   < >  --    < >  --    < > 4.3   < > 4.1 3.7 4.1  CL  --    < > 105   < >  --    < >  --    < > 104   < > 105 104 102  CO2  --    < > 23   < >  --    < >  --    < > 20*  --  20* 21*  --   GLUCOSE  --    < > 162*   < >  --    < >  --    < > 94   < > 113* 107* 108*  BUN  --    < > 25*   < >  --    < >  --    < > 17   < > 18 14 24*  CREATININE  --    < > 1.27*   < >  --    < >  --    < > 0.98   < > 1.19 1.04 1.10  CALCIUM  --    < > 7.9*   < >  --    < >  --    < > 7.5*  --  8.0* 7.9*  --   MG 1.9  --  1.9  --  1.9  --  1.8  --   --   --   --   --   --   PHOS 5.0*  --   --   --   --   --   --   --   --   --   --   --   --  TSH 2.205  --   --   --   --   --   --   --   --   --   --   --   --    < > = values in this interval not displayed.   Liver Function Tests: Recent Labs    05/25/19 0207 07/02/19 1446 09/22/19 1221 10/02/19 0953 10/03/19 0502  AST 24   < > 42* 46* 29  ALT 20   < > 30 35 24  ALKPHOS 49  --   --  95 58  BILITOT 1.0   < > 0.5 0.8 1.4*  PROT 6.2*   < > 7.3 6.5 4.7*  ALBUMIN 3.1*  --   --  3.1* 2.2*   < > = values in this interval not displayed.   No results for input(s): LIPASE, AMYLASE in the last 8760 hours. No results for input(s): AMMONIA in the last 8760 hours. CBC: Recent Labs    10/02/19 0952 10/02/19 2335 10/09/19 0310 10/09/19 0310 10/11/19 2035 10/17/19 1337 11/03/19 0057  WBC 13.2*   < > 10.6*  --  10.8* 11.7*  --   NEUTROABS 10.3*  --  8.4*  --   --  9,688*  --   HGB 7.1*   < > 8.3*   < > 7.7* 8.3* 9.2*  HCT 24.6*   < > 28.2*   < > 25.7* 26.6* 27.0*  MCV 70.5*   < > 85.5  --  83.2 78.0*  --   PLT 361   < > 328  --  356 459*  --    < > = values in this interval not displayed.   Lipid Panel: Recent Labs    07/02/19 1446  CHOL 191  HDL 40  TRIG 514*  CHOLHDL 4.8   TSH: Recent Labs    10/02/19 1236  TSH 2.205   A1C:  No results found for: HGBA1C   Assessment/Plan 1. Benign prostatic hyperplasia with urinary retention -continues on proscar and flomax, has suprapubic which has been draining well.  2. Suprapubic catheter ([place]) -stable, continues to follow up with urology.   3. Iron deficiency anemia, unspecified iron deficiency anemia type -declines follow up on blood work today.   4. Positive hepatitis C antibody test -has been referred to ID twice in the past, educated about this again today. He is agreeable for appt.  - Ambulatory referral to Infectious Disease  5. Essential hypertension -stable on hctz and metoprolol.   Next appt: 4 months.  Carlos American. [place], [facility] [phone]

## 2019-12-25 ENCOUNTER — Other Ambulatory Visit: Payer: Self-pay | Admitting: Nurse Practitioner

## 2020-01-01 ENCOUNTER — Ambulatory Visit: Payer: Self-pay | Admitting: *Deleted

## 2020-01-05 ENCOUNTER — Telehealth: Payer: Self-pay

## 2020-01-05 DIAGNOSIS — M2141 Flat foot [pes planus] (acquired), right foot: Secondary | ICD-10-CM

## 2020-01-05 DIAGNOSIS — L989 Disorder of the skin and subcutaneous tissue, unspecified: Secondary | ICD-10-CM

## 2020-01-05 NOTE — Telephone Encounter (Signed)
Patient stated he felt like his arch on his right foot has fallen and is requesting a Podiatry referral.  He also states he has growths on his back and he would like a referral to Dermatology for removal.   I pended both referrals.

## 2020-01-06 ENCOUNTER — Encounter: Payer: Medicare Other | Admitting: Infectious Diseases

## 2020-01-14 DIAGNOSIS — R6889 Other general symptoms and signs: Secondary | ICD-10-CM | POA: Diagnosis not present

## 2020-01-14 DIAGNOSIS — N35011 Post-traumatic bulbous urethral stricture: Secondary | ICD-10-CM | POA: Diagnosis not present

## 2020-01-14 DIAGNOSIS — R339 Retention of urine, unspecified: Secondary | ICD-10-CM | POA: Diagnosis not present

## 2020-01-22 ENCOUNTER — Ambulatory Visit: Payer: Medicare Other | Admitting: Sports Medicine

## 2020-01-22 DIAGNOSIS — R6889 Other general symptoms and signs: Secondary | ICD-10-CM | POA: Diagnosis not present

## 2020-01-23 ENCOUNTER — Ambulatory Visit: Payer: Self-pay | Admitting: *Deleted

## 2020-01-26 ENCOUNTER — Other Ambulatory Visit: Payer: Self-pay | Admitting: *Deleted

## 2020-01-26 NOTE — Patient Outreach (Signed)
[facility]) Care Management  01/26/2020  [name] [name]. Aug 08, 1945 [id number]   Unsuccessful outreach to complex care patient  Referral Date:10/13/19 Referral Source:[facility] hospital liaison, Natividad Brood Referral Reason:High ED utilizer in the [employer] medicare [facility]): Please assign to [place] care coordinator for complex care, PCP does TOC and disease management follow calls and support needs assess for further needs Insurance:[employer] medicare  Outreach attempt to the home number [phone] No answer.   Plan: [facility] Ms RN CM scheduled this patient for another call attempt within 4-7 business days  Heylee Tant L. Lavina Hamman, RN, BSN, [place] Coordinator Office number [phone] Mobile number [phone]  Main [facility] number [phone] Fax number [phone]

## 2020-01-27 ENCOUNTER — Encounter (HOSPITAL_COMMUNITY): Payer: Self-pay

## 2020-01-27 ENCOUNTER — Emergency Department (HOSPITAL_COMMUNITY)
Admission: EM | Admit: 2020-01-27 | Discharge: 2020-01-27 | Disposition: A | Discharge status: Home / Self Care | Payer: Medicare Other | Attending: Emergency Medicine | Admitting: Emergency Medicine

## 2020-01-27 ENCOUNTER — Encounter: Payer: Medicare Other | Admitting: Infectious Diseases

## 2020-01-27 DIAGNOSIS — Z7982 Long term (current) use of aspirin: Secondary | ICD-10-CM | POA: Insufficient documentation

## 2020-01-27 DIAGNOSIS — T83091A Other mechanical complication of indwelling urethral catheter, initial encounter: Secondary | ICD-10-CM

## 2020-01-27 DIAGNOSIS — R609 Edema, unspecified: Secondary | ICD-10-CM | POA: Diagnosis not present

## 2020-01-27 DIAGNOSIS — Z79891 Long term (current) use of opiate analgesic: Secondary | ICD-10-CM | POA: Insufficient documentation

## 2020-01-27 DIAGNOSIS — I10 Essential (primary) hypertension: Secondary | ICD-10-CM | POA: Diagnosis not present

## 2020-01-27 DIAGNOSIS — R339 Retention of urine, unspecified: Secondary | ICD-10-CM | POA: Diagnosis not present

## 2020-01-27 DIAGNOSIS — R338 Other retention of urine: Secondary | ICD-10-CM

## 2020-01-27 DIAGNOSIS — N390 Urinary tract infection, site not specified: Secondary | ICD-10-CM | POA: Diagnosis not present

## 2020-01-27 DIAGNOSIS — Z79899 Other long term (current) drug therapy: Secondary | ICD-10-CM | POA: Insufficient documentation

## 2020-01-27 DIAGNOSIS — T83098A Other mechanical complication of other indwelling urethral catheter, initial encounter: Secondary | ICD-10-CM | POA: Diagnosis not present

## 2020-01-27 DIAGNOSIS — Z743 Need for continuous supervision: Secondary | ICD-10-CM | POA: Diagnosis not present

## 2020-01-27 LAB — URINALYSIS, ROUTINE W REFLEX MICROSCOPIC
Bilirubin Urine: NEGATIVE
Glucose, UA: NEGATIVE mg/dL
Ketones, ur: NEGATIVE mg/dL
Nitrite: NEGATIVE
Protein, ur: 30 mg/dL — AB
Specific Gravity, Urine: 1.011 (ref 1.005–1.030)
WBC, UA: >50 WBC/hpf — ABNORMAL HIGH (ref 0–5)
pH: 8 (ref 5.0–8.0)

## 2020-01-27 MED ORDER — CEPHALEXIN 500 MG PO CAPS
500.0000 mg | ORAL_CAPSULE | Freq: Four times a day (QID) | ORAL | 0 refills | Status: DC
Start: 2020-01-27 — End: 2020-02-23

## 2020-01-27 MED ORDER — CEPHALEXIN 500 MG PO CAPS
500.0000 mg | ORAL_CAPSULE | Freq: Once | ORAL | Status: AC
  Administered 2020-01-27: 500 mg via ORAL
  Filled 2020-01-27: qty 1

## 2020-01-27 NOTE — ED Triage Notes (Signed)
Pt presents via EMS with c/o issues with his suprapubic catheter. EMS reported that there is urine leaking out around the catheter. Pt feels like he needs to urinate but says that he is unable to do so. Pt also has significant leg swelling per EMS. Pt reports the symptoms all started yesterday.

## 2020-01-27 NOTE — ED Notes (Signed)
Attempted to irrigate the foley with no success. Provider made aware.

## 2020-01-27 NOTE — Discharge Instructions (Signed)
Your foley have been replaced.  Please take antibiotic as treatment for urinary tract infection that may contribute to your clogged foley.  Follow up with your urologist for further care.  Return if you have any concerns.

## 2020-01-27 NOTE — ED Notes (Signed)
ED Provider at bedside. 

## 2020-01-27 NOTE — ED Notes (Signed)
Blood scan done volume 449 ml

## 2020-01-27 NOTE — ED Provider Notes (Signed)
[place] DEPT Provider Note   CSN: [id number] Arrival date & time: 01/27/20  1258     History Chief Complaint  Patient presents with  . Catheter issues    [name] [name]. is a 74 y.o. male.  The history is provided by the patient and medical records. No language interpreter was used.     74 year old male significant history of BPH with urinary retention status post suprapubic catheter insertion, chronic leg swelling, hepatitis C, anemia, hypertension, presenting complaining of urinary retention.  Patient states he noticed lack of urine from his urine bag which started last night.  This is similar to prior urinary obstruction from a clogged Foley.  He mention the last time he had his Foley changed was approximately 2 weeks ago.  Typically he would try to clean it in the shower and it will work however despite trying to clean his Foley, he was unable to produce any urine.  He has noticed urine leakage from his penis throughout the day today but not from the Foley.  He does endorse lower abdominal tenderness from urinary retention and rates his pain as mild to moderate but tolerable.  He denies any fever or chills.  He denies any recent injury.  He denies noticing any blood.  He has noticed bilateral leg swelling worse than prior and attributed to his Foley being clogged as this has happened in the past.  He is up-to-date with Covid vaccination.   Past Medical History:  Diagnosis Date  . Anemia 10/02/2019   DUE TO BLOOD LOSS  . Blind   . Disorder of bone and cartilage, unspecified   . Edema   . Elevated prostate specific antigen (PSA)   . Hypertension   . Hypopotassemia   . Nonspecific abnormal results of liver function study   . Unspecified glaucoma(365.9)     Patient Active Problem List   Diagnosis Date Noted  . Swelling of lower leg 10/02/2019  . 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 Thrombocytosis ([place]) 09/13/2019  . Urinary  retention 09/13/2019  . AKI (acute kidney injury) ([place]) 05/25/2019  . BPH (benign prostatic hyperplasia) 05/25/2019  . Anemia due to blood loss, chronic   . Acute blood loss anemia 01/14/2018  . Hypokalemia 01/14/2018  . 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 Hematuria 12/23/2012  . Chest pain 12/23/2012  . Anemia due to blood loss, acute 12/23/2012  . Elevated prostate specific antigen (PSA)   . Hypertension   . Edema   . Severe stage glaucoma 08/14/2011  . 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 CYSTOSCOPY WITH DIRECT VISION INTERNAL URETHROTOMY N/A 10/02/2019   Procedure: CYSTOSCOPY WITH DIRECT VISION INTERNAL URETHROTOMY WITH CLOT EVACUATION retrograd urethralgram Coverted to Open.;  Surgeon: Lucas Mallow, MD;  Location: [place];  Service: Urology;  Laterality: N/A;  . CYSTOSCOPY WITH FULGERATION N/A 01/17/2018   Procedure: CYSTOSCOPY CLOT EVACUATION WITH FULGERATION TUR BIOPSY OF BLADDER NECK;  Surgeon: Irine Seal, MD;  [facility];  Service: Urology;  Laterality: N/A;  . CYSTOSTOMY N/A 10/02/2019   Procedure: Open Cystostomy Suprapubic tube placement and JP drain and urethral dilation;  Surgeon: Lucas Mallow, MD;  Location: [place];  Service: Urology;  Laterality: N/A;  . EYE SURGERY Bilateral 2009   for glaucoma  . EYE SURGERY Left 2014  . HERNIA REPAIR Right 2003  Dr. Jeraldine Loots  . IR ANGIOGRAM EXTREMITY RIGHT  10/03/2019  . IR ANGIOGRAM PELVIS SELECTIVE OR SUPRASELECTIVE  10/03/2019  . IR ANGIOGRAM PELVIS SELECTIVE OR SUPRASELECTIVE  10/03/2019  . IR ANGIOGRAM SELECTIVE EACH ADDITIONAL VESSEL  10/03/2019  . IR ANGIOGRAM SELECTIVE EACH ADDITIONAL VESSEL  10/03/2019  . IR ANGIOGRAM SELECTIVE EACH ADDITIONAL VESSEL  10/03/2019  . IR EMBO ART  VEN HEMORR LYMPH EXTRAV  INC GUIDE ROADMAPPING  10/03/2019  . IR US GUIDE VASC ACCESS RIGHT   10/03/2019  . LAPAROSCOPIC APPENDECTOMY N/A 12/24/2012   Procedure:  LAPAROSCOPIC APPENDECTOMY ;  Surgeon: Pedro Earls, MD;  [facility];  Service: General;  Laterality: N/A;  . TRANSURETHRAL RESECTION OF PROSTATE N/A 05/27/2019   Procedure: Cystoscopy Clot Evacuation and Fulgration;  Surgeon: Ardis Hughs, MD;  [facility];  Service: Urology;  Laterality: N/A;       Family History  Problem Relation Age of Onset  . Kidney failure Mother     Social History   Tobacco Use  . Smoking status: Former Smoker    Types: Cigarettes    Quit date: 06/05/1968    Years since quitting: 51.6  . Smokeless tobacco: Never Used  Vaping Use  . Vaping Use: Never used  Substance Use Topics  . Alcohol use: No    Comment: STOPPED IN 1970S  . Drug use: No    Home Medications Prior to Admission medications   Medication Sig Start Date End Date Taking? Authorizing Provider  Ascorbic Acid (VITAMIN C WITH ROSE HIPS) 1000 MG tablet Take 1,000 mg by mouth daily with lunch.    [provider]  aspirin-acetaminophen-caffeine (EXCEDRIN MIGRAINE) [phone] MG tablet Take 1 tablet by mouth daily as needed (pain).    [provider]  brimonidine (ALPHAGAN) 0.2 % ophthalmic solution Place 1 drop into both eyes 2 (two) times daily. 03/11/19   [provider]  dorzolamide-timolol (COSOPT) 22.3-6.8 MG/ML ophthalmic solution APPOINTMENT OVERDUE, instill 1 drop in both eyes twice daily 07/14/13   [name], NP  finasteride (PROSCAR) 5 MG tablet Take 5 mg by mouth daily. 10/28/19   [provider]  folic acid (FOLVITE) 433 MCG tablet Take 800 mcg by mouth as directed. Once a week or periodically    [provider]  hydrochlorothiazide (MICROZIDE) 12.5 MG capsule TAKE 1 CAPSULE(12.5 MG) BY MOUTH DAILY 12/25/19   [name], NP  Magnesium 250 MG TABS Take 250 mg by mouth daily as needed (migraine headache).     [provider]  metoprolol  tartrate (LOPRESSOR) 25 MG tablet Take 0.5 tablets (12.5 mg total) by mouth 2 (two) times daily. 12/22/19   [name], NP  pilocarpine (PILOCAR) 2 % ophthalmic solution Place 1 drop into the right eye every 6 (six) hours.  12/25/17   [provider]  potassium chloride SA (KLOR-CON) 20 MEQ tablet TAKE 1 TABLET(20 MEQ) BY MOUTH DAILY 12/25/19   [name], NP  pyridOXINE (VITAMIN B-6) 50 MG tablet Take 50 mg by mouth daily with lunch. Patient not taking: Reported on 12/22/2019    [provider]  tamsulosin (FLOMAX) 0.4 MG CAPS capsule Take 0.4 mg by mouth daily. 10/28/19   [provider]  vitamin B-12 (CYANOCOBALAMIN) 1000 MCG tablet Take 1,000 mcg by mouth daily with lunch.    [provider]    Allergies    Patient has no known allergies.  Review of Systems   Review of Systems  All other systems reviewed and are negative.   Physical Exam Updated Vital Signs BP (!) 146/87   Pulse 80   Temp 99 F (37.2 C) (Oral)   Resp 18   SpO2 100%   Physical Exam Vitals and nursing note reviewed.  Constitutional:      General: He is not in acute distress.    Appearance: He is well-developed.     Comments: Elderly male appears to be in no acute discomfort.  HENT:     Head: Atraumatic.  Eyes:     Conjunctiva/sclera: Conjunctivae normal.  Cardiovascular:     Rate and Rhythm: Normal rate and regular rhythm.     Pulses: Normal pulses.     Heart sounds: Normal heart sounds.  Pulmonary:     Effort: Pulmonary effort is normal.     Breath sounds: Normal breath sounds. No wheezing, rhonchi or rales.  Abdominal:     General: There is distension (Distention noted at the suprapubic region with mild tenderness to palpation.  Suprapubic catheter in place.).  Musculoskeletal:        General: Swelling (3+ pitting edema to bilateral lower extremities) present.     Cervical back: Neck supple.  Skin:    Findings: No rash.  Neurological:     Mental  Status: He is alert.     ED Results / Procedures / Treatments   Labs (all labs ordered are listed, but only abnormal results are displayed) Labs Reviewed  URINALYSIS, ROUTINE W REFLEX MICROSCOPIC    EKG None  Radiology No results found.  Procedures ASPIRATION BLADDER INSERT SUPRAPUBIC CATHETER  Date/Time: 01/27/2020 7:56 PM Performed by: Domenic Moras, PA-C Authorized by: Domenic Moras, PA-C  Local anesthesia used: no  Anesthesia: Local anesthesia used: no  Sedation: Patient sedated: no  Patient tolerance: patient tolerated the procedure well with no immediate complications Comments: Clogged Suprapubic catheter removed, and replaced with another 22 French suprapubic Foley catheter which was done by me under sterile technique. Patient tolerates well.    (including critical care time)  Medications Ordered in ED Medications - No data to display  ED Course  I have reviewed the triage vital signs and the nursing notes.  Pertinent labs & imaging results that were available during my care of the patient were reviewed by me and considered in my medical decision making (see chart for details).    MDM Rules/Calculators/A&P                          BP (!) 146/87   Pulse 80   Temp 99 F (37.2 C) (Oral)   Resp 18   SpO2 100%   Final Clinical Impression(s) / ED Diagnoses Final diagnoses:  Acute urinary retention  Obstructed Foley catheter, initial encounter ([place])  Acute lower UTI    Rx / DC Orders ED Discharge Orders         Ordered    cephALEXin (KEFLEX) 500 MG capsule  4 times daily        01/27/20 2131         7:04 PM Patient has a suprapubic catheter who is here for urinary retention likely due to a clogged catheter.  Plan to irrigate the catheter if it is unsuccessful, I will exchange his suprapubic catheter.  No dysuria concerning for UTI.  7:54 PM Bladder scan shows greater than 500 cc of retained urine. I was able to remove the Foley catheter and  replace it with another  35 French Foley catheter without any difficulty. Patient tolerates well.  9:33 PM Urine is cloudy, showing large leukocytes and >50 WBC and many bacteria.  Since UTI can potentially cause clogged foley catheter, I will sent for urine culture, but will prescribe keflex as treatment for UTI.  Care discussed with Dr. Darl Householder.  Pt stable for discharge to f/u with his urologist. Return precaution given.       Domenic Moras, PA-C 01/27/20 2134    Drenda Freeze, MD 01/27/20 [phone]

## 2020-01-29 ENCOUNTER — Other Ambulatory Visit: Payer: Self-pay

## 2020-01-29 ENCOUNTER — Other Ambulatory Visit: Payer: Self-pay | Admitting: *Deleted

## 2020-01-29 NOTE — Patient Outreach (Signed)
[name] [name]) Care Management  01/29/2020  [name] [name]. Nov 13, 1945 [id number]   [place] [name] Telephone Assessment/Screen forpost [name] complex care Referral Date:10/13/19 Referral Source:[name] [name], [name] [name] Referral Reason:High ED utilizer in the united healthcare medicare [name] [name]: Please assign to [place] care coordinator for complex care, PCP does TOC and disease management follow calls and support needs assess for further needs Insurance:united healthcare medicare  Care coordination assessment- Food/Home personal care services  [name] [name] reports he is in need a box of food sent to him and would be appreciative  He reports his [name] [name] home services visits stopped about 2 weeks ago He reports this is related to the staff not arriving on time.  He reports when he questioned arrival times and staff honesty, the services were discontinued  He reports he tried to get re instatement of services by speaking with [name] [name] staff but [name] [name] concluded services ($20/hour 4 hours of "grant" services that would have ended in September 2021 vs August 2021). He states he looked around for other services like [name] [name] but did not find them beneficial. With [facility] he was on a "grant" He confirms he is using [facility] transportation and local transportation without issues. He agrees that there may not be able to find an agency comparable to [name] [name] He reports he is getting meds was going out with [facility] apt lease may 2022 [name] [name] talks at a fast pace and [name] RN CM at times is not able to interrupt to provide input  Recent ED visit He confirms he went to ED related to his suprapubic cath that stopped draining and he was having urinary/abdominal pain He tried getting in shower to try to get relief from the pain without success. He reports a new catheter was place and the issues were resolved He shared that he did not want removal of his  prostate He discussed in 03/2013 [facility] finding a "bleeding cyst" but wanted to remove his prostate "instead"  Transportation Provided PART transportation information to assist possible with out of [name] transportation He is familiar with SCAT, taxis, united healthcare transportation benefit for in town services.  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 He reports he memorizes familiar information needed.  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 He has supports of [place] services He has some assistance from his brother  He reports receiving $1300/month   Conditions  acute kidney injury, Benign prostatic hyperplasia (BPH), urinary retentions, thrombocytosis, legally blind, edema, chest pain, hypochromic microcytic anemia, hypokalemia, severe primary open angle stage glaucoma, hematuria, senile nuclear sclerosis, right corneal scar, Hypertension (HTN) DME cane  Falls none reported  Appointments 07/05/20 [name] Mustache NP primary care provider (PCP) 01/30/20 & 02/12/20 [name] [name] [name]    Plans  [name] RN CM will follow up with [name] [name] within the next 7-14 days for further assessment Pt encouraged to return a call to [name] RN CM prn  Routed note to MD  Goals Addressed              This Visit's Progress     Patient Stated   .  Patient will be able to manage his hypertenstion and suprapubic catheter urinary symptoms at home (pt-stated)  CARE PLAN ENTRY (see longitudinal plan of care for additional care plan information)  Objective:  . Last practice recorded BP readings:  BP Readings from Last 3 Encounters:  01/27/20 113/81  12/22/19 122/84  11/03/19 (!) 169/83 .   Marland Kitchen Most recent eGFR/CrCl: No results found for: EGFR  No components found for: CRCL  Current Barriers:   Marland Kitchen Knowledge deficit related to self care management of hypertension . Vision or hearing barriers- Legally blind  Case Manager Clinical Goal(s):  Marland Kitchen Over the next 31 days, patient will not experience [name] admission. [name] Admissions in last 6 months = 1 . Over the next 45 days, patient will demonstrate improved adherence to prescribed treatment plan for hypertension as evidenced by taking all medications as prescribed, monitoring and recording blood pressure as directed, adhering to low sodium/DASH diet . Over the next 90 days patient will demonstrate improved adherence to prescribed treatment plan for suprapubic catheter home care as evidenced by decrease in emergency room visits per EMR . Over the next 14 days patient will have food and resources to use monthly to obtain food as evidenced by patient verbally verifying he has food during [name] [name] . Over the next 14 days patient will be offered updated related to his availability to seek further services from his preferred company [name] [name] as evidenced by patient verbalizing during outreach his available options  Interventions:  . Evaluation of current treatment plan related to hypertension self management and patient's adherence to plan as established by provider. . Reviewed medications with patient and discussed importance of compliance . Discussed plans with patient for ongoing care management follow up and provided patient with direct contact information for care management team . Reviewed scheduled/upcoming provider appointments including: pcp, urology . Discussed personal care services, offered [name] [name] [facility] SW referral but he has initiated outreaches to agencies . Assessed ED visits to attempt to identify care coordination needs . Offered unknown Engineer, drilling on PARTS . Listen to [name] Bamba ventilate his feelings  Patient Self Care Activities:  . Self administers medications as prescribed . Attends  all scheduled provider appointments . Calls provider office for new concerns, questions, or BP outside discussed parameters . Monitors BP and records as discussed . Adheres to a low sodium diet/DASH diet . Increase physical activity as tolerated . Verbalize where to go to receive medical care  Initial goal documentation Please see other previous [facility] care plan information listed in Epic under the flow sheet section          Keslyn Teater L. Lavina Hamman, RN, BSN, [place] Coordinator Office number [phone] Mobile number [phone]  Main [name] number [phone] Fax number [phone]

## 2020-01-30 ENCOUNTER — Other Ambulatory Visit: Payer: Self-pay | Admitting: *Deleted

## 2020-01-30 NOTE — Patient Outreach (Signed)
[facility]) Care Management  01/30/2020  Rhet Rorke. 1946/02/24 [id number]   [place] coordination- community resource food box  collaboration with [place] for food Nutritional therapist with demographics for delivery of food box to [facility] after consulting with [employer] SWs, Di Kindle and [place]  Plan [facility] RN CM will follow up with Mr Dollard within the next 7-14 days for further assessment  Rachelann Enloe L. Lavina Hamman, RN, BSN, [place] Coordinator Office number [phone] Mobile number [phone]  Main THN number [phone] Fax number [phone]

## 2020-01-31 LAB — URINE CULTURE: Culture: >=100000 — AB

## 2020-02-01 ENCOUNTER — Telehealth: Payer: Self-pay | Admitting: Emergency Medicine

## 2020-02-01 NOTE — Telephone Encounter (Signed)
Post ED Visit - Positive Culture Follow-up: Unsuccessful Patient Follow-up  Culture assessed and recommendations reviewed by:  []  Elenor Quinones, Pharm.D. []  Heide Guile, Pharm.D., BCPS AQ-ID []  Parks Neptune, Pharm.D., BCPS []  Alycia Rossetti, Pharm.D., BCPS []  [place], Pharm.D., BCPS, AAHIVP []  Legrand Como, Pharm.D., BCPS, AAHIVP [x]  Graylin Shiver, PharmD []  Vincenza Hews, PharmD, BCPS  Positive urine culture  []  Patient discharged without antimicrobial prescription and treatment is now indicated [x]  Organism is resistant to prescribed ED discharge antimicrobial []  Patient with positive blood cultures   Unable to contact patient by phone, letter will be sent to address on file.  Plan: Stop Cephalexin Start Bactrim DS one tab PO BID x five days, [place], [employer] C Marifer Hurd 02/01/2020, 4:27 PM

## 2020-02-01 NOTE — Progress Notes (Signed)
ED Antimicrobial Stewardship Positive Culture Follow Up   Odel Schmid. is an 74 y.o. male who [facility] with a chief complaint of  Chief Complaint  Patient presents with  . Catheter issues    Recent Results (from the past 720 hour(s))  Urine culture     Status: Abnormal   Collection Time: 01/27/20  8:20 PM   Specimen: Urine, Suprapubic  Result Value Ref Range Status   Specimen Description   Final    URINE, SUPRAPUBIC Performed at [facility] [address], [place], [place] [address]    Special Requests   Final    NONE Performed at [facility], [place] [address], [place], [place] [address]    Culture (A)  Final    >=100,000 COLONIES/mL MORGANELLA MORGANII 80,000 COLONIES/mL PROTEUS MIRABILIS    Report Status 01/31/2020 FINAL  Final   Organism ID, Bacteria MORGANELLA MORGANII (A)  Final   Organism ID, Bacteria PROTEUS MIRABILIS (A)  Final      Susceptibility   Morganella morganii - MIC*    AMPICILLIN >=32 RESISTANT Resistant     CEFAZOLIN >=64 RESISTANT Resistant     CIPROFLOXACIN <=0.25 SENSITIVE Sensitive     GENTAMICIN <=1 SENSITIVE Sensitive     IMIPENEM 8 INTERMEDIATE Intermediate     NITROFURANTOIN 128 RESISTANT Resistant     TRIMETH/SULFA <=20 SENSITIVE Sensitive     AMPICILLIN/SULBACTAM >=32 RESISTANT Resistant     PIP/TAZO <=4 SENSITIVE Sensitive     * >=100,000 COLONIES/mL MORGANELLA MORGANII   Proteus mirabilis - MIC*    AMPICILLIN <=2 SENSITIVE Sensitive     CEFAZOLIN 8 SENSITIVE Sensitive     CEFTRIAXONE <=0.25 SENSITIVE Sensitive     CIPROFLOXACIN <=0.25 SENSITIVE Sensitive     GENTAMICIN <=1 SENSITIVE Sensitive     IMIPENEM 2 SENSITIVE Sensitive     NITROFURANTOIN 128 RESISTANT Resistant     TRIMETH/SULFA <=20 SENSITIVE Sensitive     AMPICILLIN/SULBACTAM <=2 SENSITIVE Sensitive     PIP/TAZO <=4 SENSITIVE Sensitive     * 80,000 COLONIES/mL PROTEUS MIRABILIS    [x]  Treated with  Keflex, organism resistant to prescribed antimicrobial []  Patient discharged originally without antimicrobial agent and treatment is now indicated  New antibiotic prescription: Bactrim DS one tablet bid x 5 days Discontinue Keflex  ED Provider: Coral Ceo, PA  Thank you,    Minda Ditto 02/01/2020, 10:27 AM Clinical Pharmacist [phone]

## 2020-02-02 ENCOUNTER — Other Ambulatory Visit: Payer: Self-pay | Admitting: *Deleted

## 2020-02-02 DIAGNOSIS — R6889 Other general symptoms and signs: Secondary | ICD-10-CM | POA: Diagnosis not present

## 2020-02-02 NOTE — Patient Outreach (Addendum)
[facility]) Care Management  02/02/2020  Lena Fieldhouse. [birth year] [id number]  [facility] Care coordination- food delivery with assist of [place] [facility], [facility] (PCS)   Outreach to [employer], [place] [employer] to discuss 3 bags of food for patient She and [facility] RN CM discussed possible resources and options for patient  [facility] RN CM [name] at [phone] at [facility] to inquire per pt request of him restarting services Shanda Bumps shared that Mr Craighead was reported asking for food and clothing from [facility] staff from their personal residences. They provided transportation for him frequently to get items prn. Mr [place] reportedly "made caregivers feel uncomfortable" Pt at times verbally aggressive or flirting with staff. He reportedly asked for young caregivers vs older caregivers. Had care act funding grant services that ended in September  For future services he would have to pay $24-25/hour plus for transportation and mileage services  Outreach to patient Patient is able to verify HIPAA [employer] Portability and Accountability Act) identifiers, date of birth (DOB) and address Reviewed and addressed the purpose of the follow up call with the patient  Consent: [facility] ([facility] Customer service manager) RN CM reviewed [facility] services with patient. Patient gave verbal consent for services.  Mr Mole was updated on the outreach from [employer] [place] [employer] related to 3 bags of food availability Mr Berrie agreed for the food to be delivered to him by [facility] RN CM Mr Brancato was updated on the response from [facility] services and he verbalized understanding "I'm being punished" He was encouraged to outreach to [employer] as the staff indicated if he was able to afford non grant service costs He verbalized understanding   3 bags of food from [place] [facility] program was delivered to Mr Kleven address. He was also provided with a list of local pantries  Plan [facility] RN CM will follow up with Mr  White[place] within the next 30-35 business days Pt encouraged to return a call to [facility] RN CM prn Routed note to MD  Goals Addressed              This Visit's Progress     Patient Stated   .  [facility]) Patient will be able to manage his hypertenstion and suprapubic catheter urinary symptoms at home (pt-stated)   On track     CARE PLAN ENTRY (see longitudinal plan of care for additional care plan information)  Objective:  . Last practice recorded BP readings:  BP Readings from Last 3 Encounters:  01/27/20 113/81  12/22/19 122/84  11/03/19 (!) 169/83 .   Marland Kitchen Most recent eGFR/CrCl: No results found for: EGFR  No components found for: CRCL  Current Barriers:  Marland Kitchen Knowledge deficit related to self care management of hypertension . Vision or hearing barriers- Legally blind  Case Manager Clinical Goal(s):  Marland Kitchen Over the next 31 days, patient will not experience hospital admission. Hospital Admissions in last 6 months = 1 . Over the next 45 days, patient will demonstrate improved adherence to prescribed treatment plan for hypertension as evidenced by taking all medications as prescribed, monitoring and recording blood pressure as directed, adhering to low sodium/DASH diet- 02/02/20 Progressing No further ED visit, BP managed  . Over the next 90 days patient will demonstrate improved adherence to prescribed treatment plan for suprapubic catheter home care as evidenced by decrease in emergency room visits per EMR . Over the next 14 days patient will have food and resources to use monthly to  obtain food as evidenced by patient verbally verifying he has food during [place]- 02/02/20 MET received food with assist of [place] [employer] Staff . Over the next 14 days patient will be offered updated related to his availability to seek further services from his preferred company [employer] as evidenced by patient verbalizing during outreach his available options MET-02/02/20 Pt updated on the response  after outreach to [employer] today. He voiced understanding   Interventions:  . Evaluation of current treatment plan related to hypertension self management and patient's adherence to plan as established by provider. . Reviewed medications with patient and discussed importance of compliance . Discussed plans with patient for ongoing care management follow up and provided patient with direct contact information for care management team . Reviewed scheduled/upcoming provider appointments including: pcp, urology . Discussed personal care services, offered [facility] SW referral but he has initiated outreaches to agencies . Assessed ED visits to attempt to identify care coordination needs . Offered unknown Engineer, drilling on PARTS . Listen to Mr Brusca ventilate his feelings . Delivered 3 bags of food to patient provided by [place] [facility] staff [place] on 02/02/20 . Provided him with a list of local [place] & resources  . Updated patient about the response from an outreach to [employer], Janett Billow and his options  Patient Self Care Activities:  . Self administers medications as prescribed . Attends all scheduled provider appointments . Calls provider office for new concerns, questions, or BP outside discussed parameters . Monitors BP and records as discussed . Adheres to a low sodium diet/DASH diet . Increase physical activity as tolerated . Verbalize where to go to receive medical care . Use resources for future food pantry items . Outreach for further personal care services independently   Please see past updates related to this goal by clicking on the "Past Updates" button in the selected goal  Please see other previous [facility] care plan information listed in Epic under the flow sheet section          Tai Skelly L. Lavina Hamman, RN, BSN, [place] Coordinator Office number [phone] Mobile number [phone]  Main  THN number [phone] Fax number [phone]

## 2020-02-04 ENCOUNTER — Encounter: Payer: Medicare Other | Admitting: Infectious Diseases

## 2020-02-05 ENCOUNTER — Ambulatory Visit: Payer: Medicare Other | Admitting: Sports Medicine

## 2020-02-10 ENCOUNTER — Telehealth: Payer: Self-pay | Admitting: Pharmacy Technician

## 2020-02-10 ENCOUNTER — Encounter: Payer: Medicare Other | Admitting: Family

## 2020-02-10 NOTE — Telephone Encounter (Signed)
[facility] Patient Teacher, English as a foreign language completed.    The patient is insured through [employer].  We will continue to follow to see if copay assistance is needed.  [name] [name]. [name] [facility] Patient [facility] for Infectious Disease Phone: [phone] Fax:  [phone]

## 2020-02-11 ENCOUNTER — Ambulatory Visit: Payer: Medicare Other | Admitting: *Deleted

## 2020-02-13 ENCOUNTER — Telehealth: Payer: Self-pay

## 2020-02-13 NOTE — Telephone Encounter (Signed)
Yes I just completed the paperwork, I have also encouraged him to make and keep appt with ID every time I have seen him in office.

## 2020-02-13 NOTE — Telephone Encounter (Signed)
Joycelyn Schmid, the referral coordinator at Infectious Disease, called and asked for help for this patient. She states he either no shows or calls and cancels/reschedules his appointment. This has happened several times.   I mentioned it to [place] and she stated we could try to get [facility] involvement or set him up for SCAT. It looks like he frequently cancels [facility] appointment also.

## 2020-02-13 NOTE — Telephone Encounter (Signed)
Tried to call Joycelyn Schmid back at [facility] and they close at 12:30 on Fridays. Will try her back on Monday.

## 2020-02-16 NOTE — Telephone Encounter (Signed)
Called Margaret at [facility] and left her a VM that SCAT paperwork was completed for the patient and it was stressed to him by his [facility] provider that he needed to keep his appointments.

## 2020-02-17 ENCOUNTER — Other Ambulatory Visit: Payer: Self-pay | Admitting: *Deleted

## 2020-02-17 NOTE — Patient Outreach (Signed)
[place] [facility]) Care Management  02/17/2020  [name] [name]. 04-11-1946 [id number]   Unsuccessful THN outreach to complex patient  [facility] RN CM received a voice message left by pt requesting a return call "Need a favor if you can help" [facility] RN CM returned the call    Unsuccessful outreach  No answer. THN RN CM left HIPAA [facility] Portability and Accountability Act) compliant voicemail message along with CM's contact info.   Plan: [facility] RN CM scheduled this THN engaged patient for another call attempt within 30 business days  Dally Oshel L. Lavina Hamman, RN, BSN, [place] Coordinator Office number [phone] Main [facility] number [phone] Fax number [phone]

## 2020-02-19 ENCOUNTER — Other Ambulatory Visit: Payer: Self-pay | Admitting: *Deleted

## 2020-02-19 ENCOUNTER — Encounter: Payer: Self-pay | Admitting: Sports Medicine

## 2020-02-19 ENCOUNTER — Ambulatory Visit (INDEPENDENT_AMBULATORY_CARE_PROVIDER_SITE_OTHER): Payer: Medicare Other | Admitting: Sports Medicine

## 2020-02-19 ENCOUNTER — Other Ambulatory Visit: Payer: Self-pay

## 2020-02-19 DIAGNOSIS — I890 Lymphedema, not elsewhere classified: Secondary | ICD-10-CM

## 2020-02-19 DIAGNOSIS — M2142 Flat foot [pes planus] (acquired), left foot: Secondary | ICD-10-CM

## 2020-02-19 DIAGNOSIS — M7989 Other specified soft tissue disorders: Secondary | ICD-10-CM | POA: Diagnosis not present

## 2020-02-19 DIAGNOSIS — M2141 Flat foot [pes planus] (acquired), right foot: Secondary | ICD-10-CM

## 2020-02-19 DIAGNOSIS — R6889 Other general symptoms and signs: Secondary | ICD-10-CM | POA: Diagnosis not present

## 2020-02-19 NOTE — Patient Outreach (Signed)
[facility] [place] [facility]) Care Management  02/19/2020  [name] [name]. 04-Mar-1946 [id number]   [facility] Incoming outreach from patient  [name] [name] outreach to [facility] RN CM He reports he had been outreaching to inquire about how to get a photo ID He reports assistance was received by a "nurse from [employer] that took me to a doctor appointment" He reports he was assisted to get to a facility other than [facility] to get a photo ID made and is awaiting the ID to arrive in 7-10 business days   [facility] RN CM discussed returning his call on 02/17/20 and a voice message was left  [facility] RN CM commended him for this process of obtaining a new photo ID  [facility] RN CM answered as many questions as possible THN RN CM discussed that [place] may have more resources of this type than the [facility] RN  [facility] RN CM encouraged him to always identify what may be needed in his voice messages.  Plans [facility] RN CM will follow up with [name] Flater in the next 30 business days  Nikkita Adeyemi L. Lavina Hamman, RN, BSN, [place] Coordinator Office number [phone] Main [facility] number [phone] Fax number [phone]

## 2020-02-19 NOTE — Progress Notes (Signed)
Subjective: [name] [name]. is a 74 y.o. male patient who presents to office for evaluation of right foot pain secondary to flatfeet.  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  Patient reports that he also has swelling to both feet and is currently taking fluid medication and using a back to pump off the fluid. Patient denies any other pedal complaints.   Admits to a history of legal blindness  Review of symptoms noncontributory  Patient Active Problem List   Diagnosis Date Noted   Swelling of lower leg 10/02/2019   Hypochromic microcytic anemia 09/13/2019   Thrombocytosis ([place]) 09/13/2019   Urinary retention 09/13/2019   AKI (acute kidney injury) ([place]) 05/25/2019   BPH (benign prostatic hyperplasia) 05/25/2019   Anemia due to blood loss, chronic    Acute blood loss anemia 01/14/2018   Hypokalemia 01/14/2018   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   Hematuria 12/23/2012   Chest pain 12/23/2012   Anemia due to blood loss, acute 12/23/2012   Elevated prostate specific antigen (PSA)    Hypertension    Edema    Severe stage glaucoma 08/14/2011   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   Current Outpatient Medications on File Prior to Visit  Medication Sig Dispense Refill   Ascorbic Acid (VITAMIN C WITH ROSE HIPS) 1000 MG tablet Take 1,000 mg by mouth daily with lunch.     brimonidine (ALPHAGAN) 0.2 % ophthalmic solution Place 1 drop into both eyes 2 (two) times daily.     dorzolamide-timolol (COSOPT) 22.3-6.8 MG/ML ophthalmic solution APPOINTMENT OVERDUE, instill 1 drop in both eyes twice daily 10 mL 0   folic acid (FOLVITE) 431 MCG tablet Take 800 mcg by mouth as directed. Once a week or periodically     hydrochlorothiazide (MICROZIDE) 12.5 MG capsule TAKE 1  CAPSULE(12.5 MG) BY MOUTH DAILY 90 capsule 1   Magnesium 250 MG TABS Take 250 mg by mouth daily as needed (migraine headache).      pilocarpine (PILOCAR) 2 % ophthalmic solution Place 1 drop into the right eye every 6 (six) hours.   0   potassium chloride SA (KLOR-CON) 20 MEQ tablet TAKE 1 TABLET(20 MEQ) BY MOUTH DAILY 90 tablet 1   pyridOXINE (VITAMIN B-6) 50 MG tablet Take 50 mg by mouth daily with lunch.      tamsulosin (FLOMAX) 0.4 MG CAPS capsule Take 0.4 mg by mouth daily.     vitamin B-12 (CYANOCOBALAMIN) 1000 MCG tablet Take 1,000 mcg by mouth daily with lunch.     aspirin-acetaminophen-caffeine (EXCEDRIN MIGRAINE) 250-250-65 MG tablet Take 1 tablet by mouth daily as needed (pain). (Patient not taking: Reported on 02/19/2020)     cephALEXin (KEFLEX) 500 MG capsule Take 1 capsule (500 mg total) by mouth 4 (four) times daily. (Patient not taking: Reported on 02/19/2020) 40 capsule 0   finasteride (PROSCAR) 5 MG tablet Take 5 mg by mouth daily. (Patient not taking: Reported on 02/19/2020)     metoprolol tartrate (LOPRESSOR) 25 MG tablet Take 0.5 tablets (12.5 mg total) by mouth 2 (two) times daily. (Patient not taking: Reported on 02/19/2020) 90 tablet 1   No current facility-administered medications on file prior to visit.   No Known Allergies   Objective:  General: Alert and oriented x3 in no acute distress  Dermatology: No open  lesions bilateral lower extremities, no webspace macerations, no ecchymosis bilateral, all nails x 10 are well manicured.  Vascular: Dorsalis Pedis and Posterior Tibial pedal pulses 0/4 due to chronic edema bilateral, worse at right compared to left  Neurology: Gross sensation intact via light touch bilateral however protective difficult to determine due to patient blind status  Musculoskeletal: No reproducible tenderness bilateral, there is significant pes planovalgus deformity right greater than left with forefoot abduction and significant rear foot  valgus right greater than left.  No pain to palpation to calf.  Assessment and Plan: Problem List Items Addressed This Visit      Other   Swelling of lower leg    Other Visit Diagnoses    Pes planus of both feet    -  Primary   Lymphedema          -Complete examination performed -Xrays not performed this visit due to patient difficulty due to blind state -Discussed treatement options; discussed pes planus deformity  -Rx Orthotics from [facility] good supportive shoes -Recommend daily stretching and use of Surgigrip compression sleeves bilateral for edema control -Return as needed or if symptoms fail to continue to improve  Landis Martins, DPM

## 2020-02-21 ENCOUNTER — Emergency Department (HOSPITAL_COMMUNITY)
Admission: EM | Admit: 2020-02-21 | Discharge: 2020-02-21 | Disposition: A | Discharge status: Home / Self Care | Payer: Medicare Other | Attending: Emergency Medicine | Admitting: Emergency Medicine

## 2020-02-21 ENCOUNTER — Other Ambulatory Visit: Payer: Self-pay

## 2020-02-21 ENCOUNTER — Encounter (HOSPITAL_COMMUNITY): Payer: Self-pay | Admitting: Emergency Medicine

## 2020-02-21 DIAGNOSIS — R339 Retention of urine, unspecified: Secondary | ICD-10-CM | POA: Insufficient documentation

## 2020-02-21 DIAGNOSIS — R600 Localized edema: Secondary | ICD-10-CM | POA: Diagnosis not present

## 2020-02-21 DIAGNOSIS — Z7982 Long term (current) use of aspirin: Secondary | ICD-10-CM | POA: Insufficient documentation

## 2020-02-21 DIAGNOSIS — R609 Edema, unspecified: Secondary | ICD-10-CM | POA: Diagnosis not present

## 2020-02-21 DIAGNOSIS — F17210 Nicotine dependence, cigarettes, uncomplicated: Secondary | ICD-10-CM | POA: Diagnosis not present

## 2020-02-21 DIAGNOSIS — Z79899 Other long term (current) drug therapy: Secondary | ICD-10-CM | POA: Diagnosis not present

## 2020-02-21 DIAGNOSIS — I10 Essential (primary) hypertension: Secondary | ICD-10-CM | POA: Insufficient documentation

## 2020-02-21 DIAGNOSIS — Z743 Need for continuous supervision: Secondary | ICD-10-CM | POA: Diagnosis not present

## 2020-02-21 DIAGNOSIS — R5381 Other malaise: Secondary | ICD-10-CM | POA: Diagnosis not present

## 2020-02-21 LAB — CBC WITH DIFFERENTIAL/PLATELET
Abs Immature Granulocytes: 0.01 10*3/uL (ref 0.00–0.07)
Basophils Absolute: 0 10*3/uL (ref 0.0–0.1)
Basophils Relative: 1 %
Eosinophils Absolute: 0.1 10*3/uL (ref 0.0–0.5)
Eosinophils Relative: 2 %
HCT: 31.9 % — ABNORMAL LOW (ref 39.0–52.0)
Hemoglobin: 9.4 g/dL — ABNORMAL LOW (ref 13.0–17.0)
Immature Granulocytes: 0 %
Lymphocytes Relative: 17 %
Lymphs Abs: 0.9 10*3/uL (ref 0.7–4.0)
MCH: 21.2 pg — ABNORMAL LOW (ref 26.0–34.0)
MCHC: 29.5 g/dL — ABNORMAL LOW (ref 30.0–36.0)
MCV: 72 fL — ABNORMAL LOW (ref 80.0–100.0)
Monocytes Absolute: 0.5 10*3/uL (ref 0.1–1.0)
Monocytes Relative: 10 %
Neutro Abs: 3.8 10*3/uL (ref 1.7–7.7)
Neutrophils Relative %: 70 %
Platelets: 268 10*3/uL (ref 150–400)
RBC: 4.43 MIL/uL (ref 4.22–5.81)
RDW: 19.9 % — ABNORMAL HIGH (ref 11.5–15.5)
WBC: 5.4 10*3/uL (ref 4.0–10.5)
nRBC: 0 % (ref 0.0–0.2)

## 2020-02-21 LAB — COMPREHENSIVE METABOLIC PANEL
ALT: 28 U/L (ref 0–44)
AST: 32 U/L (ref 15–41)
Albumin: 3.5 g/dL (ref 3.5–5.0)
Alkaline Phosphatase: 82 U/L (ref 38–126)
Anion gap: 13 (ref 5–15)
BUN: 18 mg/dL (ref 8–23)
CO2: 25 mmol/L (ref 22–32)
Calcium: 8.8 mg/dL — ABNORMAL LOW (ref 8.9–10.3)
Chloride: 103 mmol/L (ref 98–111)
Creatinine, Ser: 0.75 mg/dL (ref 0.61–1.24)
GFR calc Af Amer: >60 mL/min (ref >60)
GFR calc non Af Amer: >60 mL/min (ref >60)
Glucose, Bld: 85 mg/dL (ref 70–99)
Potassium: 3.6 mmol/L (ref 3.5–5.1)
Sodium: 141 mmol/L (ref 135–145)
Total Bilirubin: 1 mg/dL (ref 0.3–1.2)
Total Protein: 6.9 g/dL (ref 6.5–8.1)

## 2020-02-21 LAB — BRAIN NATRIURETIC PEPTIDE: B Natriuretic Peptide: 41.8 pg/mL (ref 0.0–100.0)

## 2020-02-21 MED ORDER — FUROSEMIDE 20 MG PO TABS: 20.0000 mg | ORAL_TABLET | Freq: Every day | ORAL | 0 refills | Status: DC

## 2020-02-21 NOTE — ED Provider Notes (Signed)
[facility] DEPT Provider Note   CSN: [id number] Arrival date & time: 02/21/20  1304     History Chief Complaint  Patient presents with  . catheter issues    [name] [name]. is a 74 y.o. male.  Patient is a 74 year old male with a history of hypertension, thrombocytopenia, urinary retention now with Foley catheter, lower extremity swelling on thiazide diuretics who is presenting today with a complaint of worsening leg swelling.  Patient reports he has had edema in his legs for quite some time but over the last 1 to 2 weeks his legs have become increasingly swollen.  He is also noticed over the last few days that he is having less urine output in his catheter.  However he denies any abdominal fullness, discomfort or feeling like the catheter is not draining.  He denies any chest pain or shortness of breath.  He has been taking his medications as prescribed and reports this morning he recently finished a long course of antibiotic which he took 40 pills of which he states was cephalexin because he was told that he might of had a urinary tract infection.  He has no significant pain in his legs and has not checked his weight to no how much fluid he has gained but he reports he knows the fluid needs to come off and he does not know how to make that happen.  He denies any fever or other complaints at this time.  The history is provided by the patient.       Past Medical History:  Diagnosis Date  . Anemia 10/02/2019   DUE TO BLOOD LOSS  . Blind   . Disorder of bone and cartilage, unspecified   . Edema   . Elevated prostate specific antigen (PSA)   . Hypertension   . Hypopotassemia   . Nonspecific abnormal results of liver function study   . Unspecified glaucoma(365.9)     Patient Active Problem List   Diagnosis Date Noted  . Swelling of lower leg 10/02/2019  . 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 Thrombocytosis ([place]) 09/13/2019  . Urinary  retention 09/13/2019  . AKI (acute kidney injury) ([place]) 05/25/2019  . BPH (benign prostatic hyperplasia) 05/25/2019  . Anemia due to blood loss, chronic   . Acute blood loss anemia 01/14/2018  . Hypokalemia 01/14/2018  . 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 Hematuria 12/23/2012  . Chest pain 12/23/2012  . Anemia due to blood loss, acute 12/23/2012  . Elevated prostate specific antigen (PSA)   . Hypertension   . Edema   . Severe stage glaucoma 08/14/2011  . 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 CYSTOSCOPY WITH DIRECT VISION INTERNAL URETHROTOMY N/A 10/02/2019   Procedure: CYSTOSCOPY WITH DIRECT VISION INTERNAL URETHROTOMY WITH CLOT EVACUATION retrograd urethralgram Coverted to Open.;  Surgeon: Lucas Mallow, MD;  Location: [facility];  Service: Urology;  Laterality: N/A;  . CYSTOSCOPY WITH FULGERATION N/A 01/17/2018   Procedure: CYSTOSCOPY CLOT EVACUATION WITH FULGERATION TUR BIOPSY OF BLADDER NECK;  Surgeon: Irine Seal, MD;  Location: [facility];  Service: Urology;  Laterality: N/A;  . CYSTOSTOMY N/A 10/02/2019   Procedure: Open Cystostomy Suprapubic tube placement and JP drain and urethral dilation;  Surgeon: Lucas Mallow, MD;  Location: [facility];  Service: Urology;  Laterality: N/A;  . EYE SURGERY Bilateral 2009   for glaucoma  .  EYE SURGERY Left 2014  . HERNIA REPAIR Right 2003   Dr. Jeraldine Loots  . IR ANGIOGRAM EXTREMITY RIGHT  10/03/2019  . IR ANGIOGRAM PELVIS SELECTIVE OR SUPRASELECTIVE  10/03/2019  . IR ANGIOGRAM PELVIS SELECTIVE OR SUPRASELECTIVE  10/03/2019  . IR ANGIOGRAM SELECTIVE EACH ADDITIONAL VESSEL  10/03/2019  . IR ANGIOGRAM SELECTIVE EACH ADDITIONAL VESSEL  10/03/2019  . IR ANGIOGRAM SELECTIVE EACH ADDITIONAL VESSEL  10/03/2019  . IR EMBO ART  VEN HEMORR LYMPH EXTRAV  INC GUIDE ROADMAPPING  10/03/2019  . IR US GUIDE VASC ACCESS RIGHT   10/03/2019  . LAPAROSCOPIC APPENDECTOMY N/A 12/24/2012   Procedure:  LAPAROSCOPIC APPENDECTOMY ;  Surgeon: Pedro Earls, MD;  Location: [facility];  Service: General;  Laterality: N/A;  . TRANSURETHRAL RESECTION OF PROSTATE N/A 05/27/2019   Procedure: Cystoscopy Clot Evacuation and Fulgration;  Surgeon: Ardis Hughs, MD;  Location: [facility];  Service: Urology;  Laterality: N/A;       Family History  Problem Relation Age of Onset  . Kidney failure Mother     Social History   Tobacco Use  . Smoking status: Former Smoker    Types: Cigarettes    Quit date: 06/05/1968    Years since quitting: 51.7  . Smokeless tobacco: Never Used  Vaping Use  . Vaping Use: Never used  Substance Use Topics  . Alcohol use: No    Comment: STOPPED IN 1970S  . Drug use: No    Home Medications Prior to Admission medications   Medication Sig Start Date End Date Taking? Authorizing Provider  Ascorbic Acid (VITAMIN C WITH ROSE HIPS) 1000 MG tablet Take 1,000 mg by mouth daily with lunch.    [provider]  aspirin-acetaminophen-caffeine (EXCEDRIN MIGRAINE) [phone] MG tablet Take 1 tablet by mouth daily as needed (pain). Patient not taking: Reported on 02/19/2020    [provider]  brimonidine (ALPHAGAN) 0.2 % ophthalmic solution Place 1 drop into both eyes 2 (two) times daily. 03/11/19   [provider]  cephALEXin (KEFLEX) 500 MG capsule Take 1 capsule (500 mg total) by mouth 4 (four) times daily. Patient not taking: Reported on 02/19/2020 01/27/20   Domenic Moras, PA-C  dorzolamide-timolol (COSOPT) 22.3-6.8 MG/ML ophthalmic solution APPOINTMENT OVERDUE, instill 1 drop in both eyes twice daily 07/14/13   [name], NP  finasteride (PROSCAR) 5 MG tablet Take 5 mg by mouth daily. Patient not taking: Reported on 02/19/2020 10/28/19   [provider]  folic acid (FOLVITE) 967 MCG tablet Take 800 mcg by mouth as directed. Once a week or periodically    [provider]  hydrochlorothiazide (MICROZIDE) 12.5 MG capsule TAKE 1 CAPSULE(12.5 MG) BY MOUTH DAILY 12/25/19   [name], NP  Magnesium 250 MG TABS Take 250 mg by mouth daily as needed (migraine headache).     [provider]  metoprolol tartrate (LOPRESSOR) 25 MG tablet Take 0.5 tablets (12.5 mg total) by mouth 2 (two) times daily. Patient not taking: Reported on 02/19/2020 12/22/19   [name], NP  pilocarpine (PILOCAR) 2 % ophthalmic solution Place 1 drop into the right eye every 6 (six) hours.  12/25/17   [provider]  potassium chloride SA (KLOR-CON) 20 MEQ tablet TAKE 1 TABLET(20 MEQ) BY MOUTH DAILY 12/25/19   [name], NP  pyridOXINE (VITAMIN B-6) 50 MG tablet Take 50 mg by mouth daily with lunch.     [provider]  tamsulosin (FLOMAX) 0.4 MG CAPS capsule  Take 0.4 mg by mouth daily. 10/28/19   [provider]  vitamin B-12 (CYANOCOBALAMIN) 1000 MCG tablet Take 1,000 mcg by mouth daily with lunch.    [provider]    Allergies    Patient has no known allergies.  Review of Systems   Review of Systems  All other systems reviewed and are negative.   Physical Exam Updated Vital Signs BP 133/85   Pulse 68   Temp 98 F (36.7 C) (Oral)   Resp 18   Ht 5\' 8"  (1.727 m)   Wt 88.5 kg   SpO2 96%   BMI 29.65 kg/m   Physical Exam Vitals and nursing note reviewed.  Constitutional:      General: He is not in acute distress.    Appearance: He is well-developed.  HENT:     Head: Normocephalic and atraumatic.  Eyes:     Conjunctiva/sclera: Conjunctivae normal.     Pupils: Pupils are equal, round, and reactive to light.  Cardiovascular:     Rate and Rhythm: Normal rate and regular rhythm.     Pulses: Normal pulses.     Heart sounds: No murmur heard.   Pulmonary:     Effort: Pulmonary effort is normal. No respiratory distress.     Breath sounds: Normal breath sounds. No wheezing or rales.  Abdominal:      General: There is no distension.     Palpations: Abdomen is soft.     Tenderness: There is no abdominal tenderness. There is no guarding or rebound.     Comments: Suprapubic cath  Musculoskeletal:        General: No tenderness. Normal range of motion.     Cervical back: Normal range of motion and neck supple.     Right lower leg: Edema present.     Left lower leg: Edema present.     Comments: 3+ pitting edema to the mid thigh bilaterally.  Changes of chronic venous stasis present in bilateral tib/fib area with skin thickening.  Skin:    General: Skin is warm and dry.     Findings: No erythema or rash.  Neurological:     General: No focal deficit present.     Mental Status: He is alert and oriented to person, place, and time. Mental status is at baseline.  Psychiatric:        Mood and Affect: Mood normal.        Behavior: Behavior normal.        Thought Content: Thought content normal.     ED Results / Procedures / Treatments   Labs (all labs ordered are listed, but only abnormal results are displayed) Labs Reviewed  CBC WITH DIFFERENTIAL/PLATELET - Abnormal; Notable for the following components:      Result Value   Hemoglobin 9.4 (*)    HCT 31.9 (*)    MCV 72.0 (*)    MCH 21.2 (*)    MCHC 29.5 (*)    RDW 19.9 (*)    All other components within normal limits  COMPREHENSIVE METABOLIC PANEL - Abnormal; Notable for the following components:   Calcium 8.8 (*)    All other components within normal limits  BRAIN NATRIURETIC PEPTIDE    EKG None  Radiology No results found.  Procedures SUPRAPUBIC TUBE PLACEMENT  Date/Time: 02/21/2020 3:42 PM Performed by: Blanchie Dessert, MD Authorized by: Blanchie Dessert, MD   Consent:    Consent obtained:  Verbal   Consent given by:  Patient   Risks  discussed:  Pain   Alternatives discussed:  No treatment Anesthesia (see MAR for exact dosages):    Anesthesia method:  None Procedure details:    Complexity:  Simple   Catheter  type:  Foley   Catheter size:  18 Fr   Ultrasound guidance: no     Urine characteristics:  Cloudy, foul-smelling and yellow Post-procedure details:    Patient tolerance of procedure:  Tolerated well, no immediate complications Comments:     Pt had replacement of suprapubic catheter   (including critical care time)  Medications Ordered in ED Medications - No data to display  ED Course  I have reviewed the triage vital signs and the nursing notes.  Pertinent labs & imaging results that were available during my care of the patient were reviewed by me and considered in my medical decision making (see chart for details).    MDM Rules/Calculators/A&P                          Patient presenting today with worsening swelling in his lower extremities over the last 1 to 2 weeks with significant edema noted to the mid thigh as well as decreased output in his catheter.  Patient has no abdominal pain or discomfort that he would relate to urinary retention however concern for possible new AKI versus obstructive uropathy.  Patient denies any shortness of breath, chest pain or exertional dyspnea concerning for cardiac etiology and he has no prior history of CHF.  CBC, CMP and BNP are pending.  Bladder scan did show 760 mL.  Will attempt to flush but if unable to drain the bladder will replace the suprapubic catheter.  3:42 PM Suprapubic cath replaced with return of 747mL of foul smelling urine.  However pt has no pain or infectious sx. Renal function wnl and CBC without acute findings.  BNP wnl.  Will d/c home with short course of lasix.  Also pt states he no longer has regular pcp and will try to help with pcp.  Final Clinical Impression(s) / ED Diagnoses Final diagnoses:  Urinary retention  Bilateral lower extremity edema    Rx / DC Orders ED Discharge Orders         Ordered    furosemide (LASIX) 20 MG tablet  Daily        02/21/20 1626           Blanchie Dessert, MD 02/21/20 1628

## 2020-02-21 NOTE — Discharge Instructions (Signed)
Your labs today look good.  Your catheter was blocked and a new one was placed.  You were also given prescription for the next 5 days that will hopefully get rid of some of the extra swelling in your leg.

## 2020-02-23 ENCOUNTER — Emergency Department (HOSPITAL_COMMUNITY)
Admission: EM | Admit: 2020-02-23 | Discharge: 2020-02-23 | Disposition: A | Discharge status: Home / Self Care | Payer: Medicare Other | Attending: Emergency Medicine | Admitting: Emergency Medicine

## 2020-02-23 ENCOUNTER — Telehealth: Payer: Self-pay

## 2020-02-23 ENCOUNTER — Encounter: Payer: Self-pay | Admitting: Infectious Diseases

## 2020-02-23 ENCOUNTER — Ambulatory Visit (INDEPENDENT_AMBULATORY_CARE_PROVIDER_SITE_OTHER): Payer: Medicare Other | Admitting: Infectious Diseases

## 2020-02-23 ENCOUNTER — Encounter (HOSPITAL_COMMUNITY): Payer: Self-pay

## 2020-02-23 ENCOUNTER — Other Ambulatory Visit: Payer: Self-pay

## 2020-02-23 ENCOUNTER — Telehealth: Payer: Self-pay | Admitting: Nurse Practitioner

## 2020-02-23 VITALS — BP 161/89 | HR 64 | Temp 97.9°F | Wt 199.0 lb

## 2020-02-23 DIAGNOSIS — Z743 Need for continuous supervision: Secondary | ICD-10-CM | POA: Diagnosis not present

## 2020-02-23 DIAGNOSIS — R531 Weakness: Secondary | ICD-10-CM | POA: Diagnosis not present

## 2020-02-23 DIAGNOSIS — I10 Essential (primary) hypertension: Secondary | ICD-10-CM | POA: Insufficient documentation

## 2020-02-23 DIAGNOSIS — R339 Retention of urine, unspecified: Secondary | ICD-10-CM | POA: Diagnosis present

## 2020-02-23 DIAGNOSIS — B182 Chronic viral hepatitis C: Secondary | ICD-10-CM

## 2020-02-23 DIAGNOSIS — Z87891 Personal history of nicotine dependence: Secondary | ICD-10-CM | POA: Insufficient documentation

## 2020-02-23 DIAGNOSIS — T83198A Other mechanical complication of other urinary devices and implants, initial encounter: Secondary | ICD-10-CM | POA: Diagnosis not present

## 2020-02-23 DIAGNOSIS — R5381 Other malaise: Secondary | ICD-10-CM | POA: Diagnosis not present

## 2020-02-23 DIAGNOSIS — R6889 Other general symptoms and signs: Secondary | ICD-10-CM | POA: Diagnosis not present

## 2020-02-23 DIAGNOSIS — T83090A Other mechanical complication of cystostomy catheter, initial encounter: Secondary | ICD-10-CM

## 2020-02-23 DIAGNOSIS — R609 Edema, unspecified: Secondary | ICD-10-CM | POA: Diagnosis not present

## 2020-02-23 LAB — URINALYSIS, ROUTINE W REFLEX MICROSCOPIC
Bilirubin Urine: NEGATIVE
Glucose, UA: NEGATIVE mg/dL
Ketones, ur: NEGATIVE mg/dL
Nitrite: NEGATIVE
Protein, ur: 30 mg/dL — AB
Specific Gravity, Urine: 1.008 (ref 1.005–1.030)
pH: 7 (ref 5.0–8.0)

## 2020-02-23 MED ORDER — SULFAMETHOXAZOLE-TRIMETHOPRIM 800-160 MG PO TABS: 1.0000 | ORAL_TABLET | Freq: Two times a day (BID) | ORAL | 0 refills | Status: DC

## 2020-02-23 NOTE — ED Notes (Signed)
Patient assisted into cab

## 2020-02-23 NOTE — Telephone Encounter (Signed)
Tried to call patient, no answer, mailbox is full.

## 2020-02-23 NOTE — Telephone Encounter (Signed)
Called patient, mailbox is full, and unable to leave message on voicemail.  Rodena Piety or I will have to try again later

## 2020-02-23 NOTE — ED Triage Notes (Signed)
Patient arrived via [facility] stating that he has been treated for a UTI, now today there has been no urine in his bag. Patient also has bilateral leg edema but has not had his lasix filled from yesterday.

## 2020-02-23 NOTE — Progress Notes (Signed)
[facility]                                      [address], [place], [place], [address]                                               Phn. [phone]; Fax: [phone]                                                               Date: 02/23/20 Reason for Visit: Hepatitis C    HPI: [name] [name]. is a 74 y.o.old male with PMH as below who is here for evaluation and management of hepatitis C. Patient says he knew about his hepatitis C after he was tested by his PCP a few months ago. Denies knowing about having hep C prior to testing by his PCP. He denies ever using IVD. 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 Ex smoker, used to smoke as a teenager. Has quit drinking since teeage. He has received blood transfusion at least 3 times in the past which he says is 2/2 low hb due to hematuria. Denies sharing of toothbrushes/razors, or sexual contact with known positive partners. Denies  personal or family history of liver disease.  He has not received treatment to date. He lives in [place] by himself. He was recently seen in the ED   ROS: Denies yellowish discoloration of sclera and skin, abdominal pain/distension, hematemesis.            Dcough, fever, chills, nightsweats, nausea, vomiting, diarrhea, constipation, weight loss, recent hospitalizations, rashes, joint complaints, shortness of breath, chest pain, headaches, dysuria .  Current Outpatient Medications on File Prior to Visit  Medication Sig Dispense Refill  . Ascorbic Acid (VITAMIN C WITH ROSE HIPS) 1000 MG tablet Take 1,000 mg by mouth daily with lunch.    Marland Kitchen aspirin-acetaminophen-caffeine (EXCEDRIN MIGRAINE) 250-250-65 MG tablet Take 1 tablet by mouth daily as needed (pain).     . brimonidine (ALPHAGAN) 0.2 % ophthalmic solution Place 1 drop into both eyes 2 (two) times daily.    . dorzolamide-timolol (COSOPT) 22.3-6.8 MG/ML  ophthalmic solution APPOINTMENT OVERDUE, instill 1 drop in both eyes twice daily 10 mL 0  . finasteride (PROSCAR) 5 MG tablet Take 5 mg by mouth daily.     . folic acid (FOLVITE) 947 MCG tablet Take 800 mcg by mouth as directed. Once a week or periodically    . furosemide (LASIX) 20 MG tablet Take 1 tablet (20 mg total) by mouth daily. 5 tablet 0  . hydrochlorothiazide (MICROZIDE) 12.5 MG capsule TAKE 1 CAPSULE(12.5 MG) BY MOUTH DAILY 90 capsule 1  . Magnesium 250 MG TABS Take 250 mg by mouth daily as needed (migraine headache).     . metoprolol tartrate (LOPRESSOR) 25 MG tablet Take 0.5 tablets (12.5 mg total) by mouth 2 (two) times daily. 90 tablet 1  . pilocarpine (PILOCAR) 2 % ophthalmic solution Place 1 drop into the right eye every 6 (six) hours.  0  . potassium chloride SA (KLOR-CON) 20 MEQ tablet TAKE 1 TABLET(20 MEQ) BY MOUTH DAILY 90 tablet 1  . pyridOXINE (VITAMIN B-6) 50 MG tablet Take 50 mg by mouth daily with lunch.     . tamsulosin (FLOMAX) 0.4 MG CAPS capsule Take 0.4 mg by mouth daily.    . vitamin B-12 (CYANOCOBALAMIN) 1000 MCG tablet Take 1,000 mcg by mouth daily with lunch.     No current facility-administered medications on file prior to visit.    No Known Allergies  Past Medical History:  Diagnosis Date  . Anemia 10/02/2019   DUE TO BLOOD LOSS  . Blind   . Disorder of bone and cartilage, unspecified   . Edema   . Elevated prostate specific antigen (PSA)   . Hypertension   . Hypopotassemia   . Nonspecific abnormal results of liver function study   . Unspecified glaucoma(365.9)    Past Surgical History:  Procedure Laterality Date  . CYSTOSCOPY WITH DIRECT VISION INTERNAL URETHROTOMY N/A 10/02/2019   Procedure: CYSTOSCOPY WITH DIRECT VISION INTERNAL URETHROTOMY WITH CLOT EVACUATION retrograd urethralgram Coverted to Open.;  Surgeon: Lucas Mallow, MD;  Location: [facility];  Service: Urology;  Laterality: N/A;  . CYSTOSCOPY WITH FULGERATION N/A 01/17/2018    Procedure: CYSTOSCOPY CLOT EVACUATION WITH FULGERATION TUR BIOPSY OF BLADDER NECK;  Surgeon: Irine Seal, MD;  Location: [facility];  Service: Urology;  Laterality: N/A;  . CYSTOSTOMY N/A 10/02/2019   Procedure: Open Cystostomy Suprapubic tube placement and JP drain and urethral dilation;  Surgeon: Lucas Mallow, MD;  Location: [facility];  Service: Urology;  Laterality: N/A;  . EYE SURGERY Bilateral 2009   for glaucoma  . EYE SURGERY Left 2014  . HERNIA REPAIR Right 2003   Dr. Jeraldine Loots  . IR ANGIOGRAM EXTREMITY RIGHT  10/03/2019  . IR ANGIOGRAM PELVIS SELECTIVE OR SUPRASELECTIVE  10/03/2019  . IR ANGIOGRAM PELVIS SELECTIVE OR SUPRASELECTIVE  10/03/2019  . IR ANGIOGRAM SELECTIVE EACH ADDITIONAL VESSEL  10/03/2019  . IR ANGIOGRAM SELECTIVE EACH ADDITIONAL VESSEL  10/03/2019  . IR ANGIOGRAM SELECTIVE EACH ADDITIONAL VESSEL  10/03/2019  . IR EMBO ART  VEN HEMORR LYMPH EXTRAV  INC GUIDE ROADMAPPING  10/03/2019  . IR US GUIDE VASC ACCESS RIGHT  10/03/2019  . LAPAROSCOPIC APPENDECTOMY N/A 12/24/2012   Procedure:  LAPAROSCOPIC APPENDECTOMY ;  Surgeon: Pedro Earls, MD;  Location[facility];  Service: General;  Laterality: N/A;  . TRANSURETHRAL RESECTION OF PROSTATE N/A 05/27/2019   Procedure: Cystoscopy Clot Evacuation and Fulgration;  Surgeon: Ardis Hughs, MD;  Location[facility];  Service: Urology;  Laterality: N/A;   Social History   Socioeconomic History  . Marital status: Legally Separated    Spouse name: Not on file  . Number of children: Not on file  . Years of education: Not on file  . Highest education level: Not on file  Occupational History  . Occupation: retired Training and development officer  Tobacco Use  . Smoking status: Former Smoker    Types: Cigarettes    Quit date: 06/05/1968    Years since quitting: 51.7  . Smokeless tobacco: Never Used  Vaping Use  . Vaping Use: Never used  Substance and Sexual Activity  . Alcohol use: No    Comment: STOPPED IN 1970S  . Drug use: No  . Sexual activity:  Never  Other Topics Concern  . Not on file  Social History Narrative  . Not on file   Social Determinants of Health  Financial Resource Strain:   . Difficulty of Paying Living Expenses: Not on file  Food Insecurity: Food Insecurity Present  . Worried About Charity fundraiser in the Last Year: Sometimes true  . Ran Out of Food in the Last Year: Sometimes true  Transportation Needs: No Transportation Needs  . Lack of Transportation (Medical): No  . Lack of Transportation (Non-Medical): No  Physical Activity:   . Days of Exercise per Week: Not on file  . Minutes of Exercise per Session: Not on file  Stress:   . Feeling of Stress : Not on file  Social Connections: Socially Isolated  . Frequency of Communication with Friends and Family: Twice a week  . Frequency of Social Gatherings with Friends and Family: Twice a week  . Attends Religious Services: Never  . Active Member of Clubs or Organizations: No  . Attends Archivist Meetings: Never  . Marital Status: Separated  Intimate Partner Violence: Not At Risk  . Fear of Current or Ex-Partner: No  . Emotionally Abused: No  . Physically Abused: No  . Sexually Abused: No    Physical exam: BP (!) 161/89   Pulse 64   Temp 97.9 F (36.6 C) (Oral)   Wt 199 lb (90.3 kg)   BMI 30.26 kg/m   Gen: Alert and oriented x 3, no acute distress HEENT: [place]/AT, PERL, no scleral icterus, PALE CONJUNCTIVA hearing normal, oral mucosa moist, LEGALLY BLIND  Neck: Supple, no lymphadenopathy Cardio: Regular rate and rhythm; +S1 and S2; no murmurs, gallops, or rubs Resp: CTAB; no wheezes, rhonchi, or rales GI: Soft, nontender, nondistended, bowel sounds present Extremities: BILATERAL le ARE SWOLLEN WITH SHINY SKIN, NO TENDERNESS OR ERYTHEMA Skin: No rashes, lesions, or ecchymoses Neuro: No focal deficits Psych: Calm, cooperative  Laboratory   11/19/19 HIV 4th generation NR  11/20/19 Hep A ab IgM Negative Hep B surface ag  Negative Hep B core Ab IgM Negative HCV ab positive  11/24/19 Hep C Viral RNA 7,730,000 copies/ml Hep C Genotype 3  02/03/20 Platelets 205, ALP 74, ALT 149, AST 103, TB 0.5 HBSag Negative Hep A IgM negative Hep B core Ab IgM negative  Hep C ab >11  CBC Latest Ref Rng & Units 02/21/2020 11/03/2019 10/17/2019  WBC 4.0 - 10.5 K/uL 5.4 - 11.7(H)  Hemoglobin 13.0 - 17.0 g/dL 9.4(L) 9.2(L) 8.3(L)  Hematocrit 39 - 52 % 31.9(L) 27.0(L) 26.6(L)  Platelets 150 - 400 K/uL 268 - 459(H)   CMP Latest Ref Rng & Units 02/21/2020 11/03/2019 10/11/2019  Glucose 70 - 99 mg/dL 85 108(H) 107(H)  BUN 8 - 23 mg/dL 18 24(H) 14  Creatinine 0.61 - 1.24 mg/dL 0.75 1.10 1.04  Sodium 135 - 145 mmol/L 141 138 137  Potassium 3.5 - 5.1 mmol/L 3.6 4.1 3.7  Chloride 98 - 111 mmol/L 103 102 104  CO2 22 - 32 mmol/L 25 - 21(L)  Calcium 8.9 - 10.3 mg/dL 8.8(L) - 7.9(L)  Total Protein 6.5 - 8.1 g/dL 6.9 - -  Total Bilirubin 0.3 - 1.2 mg/dL 1.0 - -  Alkaline Phos 38 - 126 U/L 82 - -  AST 15 - 41 U/L 32 - -  ALT 0 - 44 U/L 28 - -    Assessment/Plan:  Hepatitis C Discussed the pathogenesis, transmission, prevention, risks of left untreated, and treatment options for hepatitis C Patient was recently seen in the hospital for urinary obstruction and UTI. He is refusing for blood draws today as he says he has  h/o frequent requirement of blood transfusion due to bleeding from his bladder. He needs to have Fibrotest/US elastography done before inititiating treatment.   Immunization He also needs hepatitis B surface antibody and HAV total antibody. If not immune, he will need to be vaccinated for it.   I spent greater than 60 minutes with the patient including greater than 50% of time in face to face counsel of the patient and in coordination of their care.   Patient's labs were reviewed as well as his previous records. Patients questions were addressed and answered.   Rosiland Oz, MD Infectious Diseases  Office  phone [phone] Fax no. [phone]

## 2020-02-23 NOTE — ED Notes (Signed)
Pt refused Blood-work.

## 2020-02-23 NOTE — ED Notes (Signed)
32mL of Urine noted on bladder scanner

## 2020-02-23 NOTE — Telephone Encounter (Signed)
Per last ED visit pt states he does not have PCP (see below) can we please contact him and clarify if he is no longer our patient.   Thank you.   3:42 PM Suprapubic cath replaced with return of 716mL of foul smelling urine.  However pt has no pain or infectious sx. Renal function wnl and CBC without acute findings.  BNP wnl.  Will d/c home with short course of lasix.  Also pt states he no longer has regular pcp and will try to help with pcp

## 2020-02-23 NOTE — Discharge Instructions (Signed)
There appears to be evidence of possible urinary infection. The originally prescribed antibiotic, cephalexin, will likely be ineffective based on urinary culture results. Please initiate taking the sulfamethoxazole-trimethoprim (generic for Bactrim DS) instead.  Follow-up with urology on this matter.

## 2020-02-23 NOTE — ED Provider Notes (Signed)
[facility] DEPT Provider Note   CSN: [id number] Arrival date & time: 02/23/20  0214     History Chief Complaint  Patient presents with  . Urinary Retention    Detroit Frieden. is a 74 y.o. male.  HPI      Garan Frappier. is a 74 y.o. male, with a history of HTN, urinary retention, suprapubic catheter, presenting to the ED with concern for nondraining suprapubic catheter beginning 9/19. Denies fever/chills, abdominal pain, nausea/vomiting, changes in bowel movements, dizziness, or any other complaints.    Past Medical History:  Diagnosis Date  . Anemia 10/02/2019   DUE TO BLOOD LOSS  . Blind   . Disorder of bone and cartilage, unspecified   . Edema   . Elevated prostate specific antigen (PSA)   . Hypertension   . Hypopotassemia   . Nonspecific abnormal results of liver function study   . Unspecified glaucoma(365.9)     Patient Active Problem List   Diagnosis Date Noted  . Swelling of lower leg 10/02/2019  . 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 Thrombocytosis ([place]) 09/13/2019  . Urinary retention 09/13/2019  . AKI (acute kidney injury) ([place]) 05/25/2019  . BPH (benign prostatic hyperplasia) 05/25/2019  . Anemia due to blood loss, chronic   . Acute blood loss anemia 01/14/2018  . Hypokalemia 01/14/2018  . 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 Hematuria 12/23/2012  . Chest pain 12/23/2012  . Anemia due to blood loss, acute 12/23/2012  . Elevated prostate specific antigen (PSA)   . Hypertension   . Edema   . Severe stage glaucoma 08/14/2011  . 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 CYSTOSCOPY WITH DIRECT VISION INTERNAL URETHROTOMY N/A 10/02/2019   Procedure: CYSTOSCOPY WITH DIRECT VISION INTERNAL URETHROTOMY WITH CLOT EVACUATION retrograd urethralgram Coverted to Open.;  Surgeon: Lucas Mallow, MD;  Location: [facility];  Service: Urology;  Laterality: N/A;  . CYSTOSCOPY WITH FULGERATION N/A 01/17/2018   Procedure: CYSTOSCOPY CLOT EVACUATION WITH FULGERATION TUR BIOPSY OF BLADDER NECK;  Surgeon: Irine Seal, MD;  [facility];  Service: Urology;  Laterality: N/A;  . CYSTOSTOMY N/A 10/02/2019   Procedure: Open Cystostomy Suprapubic tube placement and JP drain and urethral dilation;  Surgeon: Lucas Mallow, MD;  Location: [facility];  Service: Urology;  Laterality: N/A;  . EYE SURGERY Bilateral 2009   for glaucoma  . EYE SURGERY Left 2014  . HERNIA REPAIR Right 2003   Dr. Jeraldine Loots  . IR ANGIOGRAM EXTREMITY RIGHT  10/03/2019  . IR ANGIOGRAM PELVIS SELECTIVE OR SUPRASELECTIVE  10/03/2019  . IR ANGIOGRAM PELVIS SELECTIVE OR SUPRASELECTIVE  10/03/2019  . IR ANGIOGRAM SELECTIVE EACH ADDITIONAL VESSEL  10/03/2019  . IR ANGIOGRAM SELECTIVE EACH ADDITIONAL VESSEL  10/03/2019  . IR ANGIOGRAM SELECTIVE EACH ADDITIONAL VESSEL  10/03/2019  . IR EMBO ART  VEN HEMORR LYMPH EXTRAV  INC GUIDE ROADMAPPING  10/03/2019  . IR US GUIDE VASC ACCESS RIGHT  10/03/2019  . LAPAROSCOPIC APPENDECTOMY N/A 12/24/2012   Procedure:  LAPAROSCOPIC APPENDECTOMY ;  Surgeon: Pedro Earls, MD;  Location[facility];  Service: General;  Laterality: N/A;  . TRANSURETHRAL RESECTION OF PROSTATE N/A 05/27/2019   Procedure: Cystoscopy Clot Evacuation and Fulgration;  Surgeon: Ardis Hughs, MD;  Location[facility];  Service: Urology;  Laterality: N/A;       Family History  Problem Relation Age of Onset  . Kidney failure Mother     Social History   Tobacco Use  . Smoking status: Former Smoker    Types: Cigarettes    Quit date: 06/05/1968    Years since quitting: 51.7  . Smokeless tobacco: Never Used  Vaping Use  . Vaping Use: Never used  Substance Use Topics  . Alcohol use: No    Comment: STOPPED IN 1970S  . Drug use: No    Home Medications Prior to Admission medications   Medication Sig Start  Date End Date Taking? Authorizing Provider  Ascorbic Acid (VITAMIN C WITH ROSE HIPS) 1000 MG tablet Take 1,000 mg by mouth daily with lunch.    [provider]  aspirin-acetaminophen-caffeine (EXCEDRIN MIGRAINE) [phone] MG tablet Take 1 tablet by mouth daily as needed (pain). Patient not taking: Reported on 02/19/2020    [provider]  brimonidine (ALPHAGAN) 0.2 % ophthalmic solution Place 1 drop into both eyes 2 (two) times daily. 03/11/19   [provider]  dorzolamide-timolol (COSOPT) 22.3-6.8 MG/ML ophthalmic solution APPOINTMENT OVERDUE, instill 1 drop in both eyes twice daily 07/14/13   [name], NP  finasteride (PROSCAR) 5 MG tablet Take 5 mg by mouth daily. Patient not taking: Reported on 02/19/2020 10/28/19   [provider]  folic acid (FOLVITE) 518 MCG tablet Take 800 mcg by mouth as directed. Once a week or periodically    [provider]  furosemide (LASIX) 20 MG tablet Take 1 tablet (20 mg total) by mouth daily. 02/21/20   Blanchie Dessert, MD  hydrochlorothiazide (MICROZIDE) 12.5 MG capsule TAKE 1 CAPSULE(12.5 MG) BY MOUTH DAILY 12/25/19   [name], NP  Magnesium 250 MG TABS Take 250 mg by mouth daily as needed (migraine headache).     [provider]  metoprolol tartrate (LOPRESSOR) 25 MG tablet Take 0.5 tablets (12.5 mg total) by mouth 2 (two) times daily. Patient not taking: Reported on 02/19/2020 12/22/19   [name], NP  pilocarpine (PILOCAR) 2 % ophthalmic solution Place 1 drop into the right eye every 6 (six) hours.  12/25/17   [provider]  potassium chloride SA (KLOR-CON) 20 MEQ tablet TAKE 1 TABLET(20 MEQ) BY MOUTH DAILY 12/25/19   [name], NP  pyridOXINE (VITAMIN B-6) 50 MG tablet Take 50 mg by mouth daily with lunch.     [provider]  sulfamethoxazole-trimethoprim (BACTRIM DS) 800-160 MG tablet Take 1 tablet by mouth 2 (two) times daily for 10 days. 02/23/20  03/04/20  Hisao Doo C, PA-C  tamsulosin (FLOMAX) 0.4 MG CAPS capsule Take 0.4 mg by mouth daily. 10/28/19   [provider]  vitamin B-12 (CYANOCOBALAMIN) 1000 MCG tablet Take 1,000 mcg by mouth daily with lunch.    [provider]    Allergies    Patient has no known allergies.  Review of Systems   Review of Systems  Constitutional: Negative for chills, diaphoresis and fever.  Gastrointestinal: Negative for abdominal pain, diarrhea, nausea and vomiting.    Physical Exam Updated Vital Signs BP (!) 147/80 (BP Location: Left Arm)   Pulse 62   Temp 97.7 F (36.5 C) (Oral)   Resp 18   SpO2 100%    Physical Exam Vitals and nursing note reviewed.  Constitutional:      General: He is not in acute distress.    Appearance: He is well-developed. He is not diaphoretic.  HENT:     Head: Normocephalic and atraumatic.  Eyes:     Conjunctiva/sclera: Conjunctivae normal.  Cardiovascular:     Rate and Rhythm: Normal rate and regular rhythm.  Pulmonary:     Effort: Pulmonary effort is normal.  Abdominal:     General: There is no distension.     Palpations: Abdomen is soft.     Tenderness: There is no abdominal tenderness.  Genitourinary:    Comments: Suprapubic catheter in place with yellow urine flowing into collection bag. Catheter insertion site without tenderness, erythema, exudate, or swelling. Musculoskeletal:     Cervical back: Neck supple.  Skin:    General: Skin is warm and dry.     Coloration: Skin is not pale.  Neurological:     Mental Status: He is alert.  Psychiatric:        Behavior: Behavior normal.     ED Results / Procedures / Treatments   Labs (all labs ordered are listed, but only abnormal results are displayed) Labs Reviewed  URINALYSIS, ROUTINE W REFLEX MICROSCOPIC - Abnormal; Notable for the following components:      Result Value   APPearance CLOUDY (*)    Hgb urine dipstick SMALL (*)    Protein, ur 30 (*)    Leukocytes,Ua LARGE  (*)    Bacteria, UA MANY (*)    All other components within normal limits  URINE CULTURE    EKG None  Radiology No results found.  Procedures Procedures (including critical care time)  Medications Ordered in ED Medications - No data to display  ED Course  I have reviewed the triage vital signs and the nursing notes.  Pertinent labs & imaging results that were available during my care of the patient were reviewed by me and considered in my medical decision making (see chart for details).    MDM Rules/Calculators/A&P                          Patient presents with concern for obstructed suprapubic catheter. After catheter was flushed by RN, it began freely draining urine.  Although the timing on the notes appear otherwise, I confirmed bladder scan with 370 mL of urine was performed before catheter was flushed. Repeat bladder scan before patient was placed for discharge showed unremarkable amount of urine.  Patient was prescribed Keflex for suspected catheter associated infection when he was seen in the ED 8/24.  Patient tells me he was unable to pick up this prescription. Culture showed growth of Morganella morganii and Proteus mirabilis.  Resistant to cefazolin, ampicillin, nitrofurantoin.  Sensitive to Cipro and Bactrim.  Findings and plan of care discussed with Dr. Florina Ou, ED attending.   Final Clinical Impression(s) / ED Diagnoses Final diagnoses:  Obstruction of suprapubic catheter, initial encounter [facility])    Rx / DC Orders ED Discharge Orders         Ordered    sulfamethoxazole-trimethoprim (BACTRIM DS) 800-160 MG tablet  2 times daily        02/23/20 0425           Lorayne Bender, PA-C 02/23/20 0653    Molpus, Jenny Reichmann, MD 02/23/20 [phone]

## 2020-02-23 NOTE — ED Notes (Signed)
Suprapubic cath irrigated, urine return noted into bag

## 2020-02-23 NOTE — Telephone Encounter (Signed)
Tried to call patient again, no answer, and mailbox is still full

## 2020-02-23 NOTE — Telephone Encounter (Signed)
[facility] Patient Advocate Encounter  Insurance verification completed.    The patient is insured through [employer].   We will continue to follow to see if copay assistance is needed.  Micheline Markes, CPhT [facility] Patient [facility] Phone: [phone] Fax:  [phone]  

## 2020-02-24 NOTE — Telephone Encounter (Signed)
Letter mailed to patient indicating an appointment is due following recent ER visit. We will also make 1 final attempt to reach patient by phone tomorrow to schedule an appointment

## 2020-02-24 NOTE — Telephone Encounter (Signed)
Patient tried calling and left message on Clinical Intake stating that he has been back and forth to [facility] for his foley cath getting stopped up.  Stated that the hospital gave him an antibiotic and it has been working fine. Patient wants to know if Janett Billow will prescribe him an antibiotic so he doesn't have to keep going to the ER with it.    I tried to call patient back, No answer and mailbox is full.

## 2020-02-24 NOTE — Telephone Encounter (Signed)
He needs ER follow up visit.  Thank you.

## 2020-02-24 NOTE — Telephone Encounter (Signed)
Called patient, no answer, and mailbox was full. I will try to call patient again later to schedule a follow-up appointment

## 2020-02-25 ENCOUNTER — Telehealth: Payer: Self-pay

## 2020-02-25 ENCOUNTER — Ambulatory Visit: Payer: Self-pay | Admitting: *Deleted

## 2020-02-25 NOTE — Telephone Encounter (Signed)
Patient called and unable to leave voicemail on phone.

## 2020-02-25 NOTE — Telephone Encounter (Signed)
Cipro is an antibiotic, he is having trouble with the catheter clotting and the ED is flushing the catheter. We can order home health to help him with this, he will need a follow up visit in office

## 2020-02-25 NOTE — Telephone Encounter (Signed)
Spoke with patient, patient refused to schedule an appointment today due to a lack of transportation. Patient states there is no way possible he can be seen today. Patient states all Janett Billow has to do is look at his chart and prescribe an antibiotic.  I placed patient on hold and spoke with [name], NP. Per Janett Billow patient needs an in office visit for evaluation, Janett Billow also stated an antibiotic is not the recommended course of action for a clogged foley. Per Janett Billow patient needs to allow home health in his home to flush foley.   Call was resumed with patient and I shared [name]'s response and patient states that does not make sense. He does not understand why he needs an appointment. Patient states he also has swelling this is a serious and extreme. I again offered patient an appointment for today and he refused. After I allowed patient to complain another minute or so he concluded that he will schedule an appointment for Friday.

## 2020-02-25 NOTE — Telephone Encounter (Signed)
Patient called and states that he keeps going to the ED and wants to know if you could send in Ciproflaxin for him. Patient states that this medication opens up suprapubic cathetor and helps with legs swelling. Patient states that he's already taking Hydrochlorothiazide, Tamsulosin, and Potassium Chloride which are all working fine. Patient was given Lasix, and Bactrim from ED but patient states that these medications aren't helping and he prefers Ciproflaxin because it helped the last time patient had this problem. Message routed to PCP Dewaine Oats, Carlos American, NP. Please Advise.

## 2020-02-26 LAB — URINE CULTURE: Culture: >=100000 — AB

## 2020-02-26 LAB — HEPATITIS C RNA QUANTITATIVE
HCV RNA, PCR, QN (Log): 6.12 log IU/mL — ABNORMAL HIGH
HCV RNA, PCR, QN: 1330000 IU/mL — ABNORMAL HIGH

## 2020-02-26 LAB — HEPATITIS C GENOTYPE

## 2020-02-27 ENCOUNTER — Telehealth: Payer: Self-pay | Admitting: *Deleted

## 2020-02-27 ENCOUNTER — Ambulatory Visit (INDEPENDENT_AMBULATORY_CARE_PROVIDER_SITE_OTHER): Payer: Medicare Other | Admitting: Adult Health

## 2020-02-27 ENCOUNTER — Encounter: Payer: Self-pay | Admitting: Adult Health

## 2020-02-27 ENCOUNTER — Other Ambulatory Visit: Payer: Self-pay

## 2020-02-27 VITALS — BP 150/100 | HR 67 | Temp 97.7°F | Resp 16 | Ht 68.0 in | Wt 196.0 lb

## 2020-02-27 DIAGNOSIS — R6889 Other general symptoms and signs: Secondary | ICD-10-CM | POA: Diagnosis not present

## 2020-02-27 DIAGNOSIS — N3000 Acute cystitis without hematuria: Secondary | ICD-10-CM

## 2020-02-27 DIAGNOSIS — R600 Localized edema: Secondary | ICD-10-CM | POA: Diagnosis not present

## 2020-02-27 DIAGNOSIS — Z9359 Other cystostomy status: Secondary | ICD-10-CM | POA: Diagnosis not present

## 2020-02-27 LAB — POCT URINALYSIS DIPSTICK
Bilirubin, UA: NEGATIVE
Glucose, UA: NEGATIVE
Ketones, UA: NEGATIVE
Nitrite, UA: POSITIVE
Protein, UA: POSITIVE — AB
Spec Grav, UA: <=1.005 — AB (ref 1.010–1.025)
Urobilinogen, UA: 0.2 E.U./dL
pH, UA: 8.5 — AB (ref 5.0–8.0)

## 2020-02-27 MED ORDER — CIPROFLOXACIN HCL 500 MG PO TABS: 500.0000 mg | ORAL_TABLET | Freq: Two times a day (BID) | ORAL | 0 refills | Status: AC

## 2020-02-27 NOTE — Patient Instructions (Signed)
Suprapubic Catheter Home Guide °A suprapubic catheter is a flexible tube that is used to drain urine from the bladder into a collection bag outside the body. The catheter is inserted into the bladder through a small opening in the lower abdomen, above the pubic bone (suprapubic area) and a few inches below your belly button (navel). A tiny balloon filled with germ-free (sterile) water helps to keep the catheter in place. °The collection bag must be emptied at least once a day and cleaned at least every other day. The collection bag can be put beside your bed at night and attached to your leg during the day. You may have a large collection bag to use at night and a smaller one to use during the day. °Your suprapubic catheter may need to be changed every 4-6 weeks, or as often as recommended by your health care provider. Healing of the tract where the catheter is placed can take 6 weeks to 6 months. During that time, your health care provider may change your catheter. Once the tract is well healed, you or a caregiver will change your suprapubic catheter at home. °What are the risks? °This catheter is safe to use. However, problems can occur, including: °· Blocked urine flow. This can occur if the catheter stops working, or if you have a blood clot in your bladder or in the catheter. °· Irritation of the skin around the catheter. °· Infection. This can happen if bacteria gets into your bladder. °Supplies needed: °· Two pairs of sterile gloves. °· Paper towels. °· Catheter. °· Two syringes. °· Sterile water. °· Sterile cleaning solution. °· Lubricant. °· Collection bags. °How to change the catheter ° °1. Drink plenty of fluids during the hours before you change the catheter. °2. Wash your hands with soap and water. If soap and water are not available, use hand sanitizer. °3. Draw up sterile water into a syringe to have ready to fill the new catheter balloon. The amount will depend on the size of the balloon. °4. Have  all of your supplies ready and close to you on a paper towel. °5. Lie on your back, sitting slightly upright so that you can see the catheter and opening. °6. Put on sterile gloves. °7. Clean the skin around the catheter opening using the sterile cleaning solution. °8. Remove the water from the balloon in the catheter using a syringe. °9. Slowly remove the catheter. If the catheter seems stuck, or if you have difficulty removing it: °? Do not pull on it. °? Call your health care provider right away. °10. Place the old catheter on a paper towel to discard later. °11. Take off the used gloves, and put on a new pair. °12. Put lubricant on the end of the new catheter that will go into your bladder. °13. Clean the skin around the catheter opening using the sterile cleaning solution. °14. Gently slide the catheter through the opening in your abdomen and into the tract that leads to your bladder. °15. Wait for some urine to start flowing through the catheter. °16. When urine starts to flow through the catheter, attach the collection bag to the end of the catheter. Make sure the connection is tight. °17. Use a syringe to fill the catheter balloon with sterile water. Fill to the amount directed by your health care provider. °18. Remove the gloves and wash your hands with soap and water. °How to care for the skin around the catheter °Follow your health care provider's instructions on   caring for your skin. °· Use a clean washcloth and soapy water to clean the skin around your catheter every day. Pat the area dry with a clean paper towel. °· Do not pull on the catheter. °· Do not use ointment or lotion on this area, unless told by your health care provider. °· Check the skin around the catheter every day for signs of infection. Check for: °? Redness, swelling, or pain. °? Fluid or blood. °? Warmth. °? Pus or a bad smell. °How to empty and clean the collection bag °Empty the large collection bag every 8 hours. Empty the small  collection bag when it is about ? full. Clean the collection bag every 2-3 days, or as often as told by your health care provider. To do this: °1. Wash your hands with soap and water. If soap and water are not available, use hand sanitizer. °2. Disconnect the bag from the catheter and immediately attach a new bag to the catheter. °3. Hold the used bag over the toilet or another container. °4. Turn the valve (spigot) at the bottom of the bag to empty the urine. Empty the used bag completely. °? Do not touch the opening of the spigot. °? Do not let the opening touch the toilet or container. °5. Close the spigot tightly when the bag is empty. °6. Clean the used bag in one of the following methods: °? According to the manufacturer's instructions. °? As told by your health care provider. °7. Let the bag dry completely. Put it in a clean plastic bag before storing it. °General tips ° °· Always wash your hands before and after caring for your catheter and collection bag. Use a mild, fragrance-free soap. If soap and water are not available, use hand sanitizer. °· Clean the outside of the catheter with soap and water as often as told by your health care provider. °· Always make sure there are no twists or kinks in the catheter tube. °· Always make sure there are no leaks in the catheter or collection bag. °· Always wear the leg bag below your knee. °· Make sure the overnight drainage bag is always lower than the level of your bladder, but do not let it touch the floor. Before you go to sleep, hang the bag inside a wastebasket that is covered by a clean plastic bag. °· Drink enough fluid to keep your urine pale yellow. °· Do not take baths, swim, or use a hot tub until your health care provider approves. Ask your health care provider if you may take showers. °Contact a heath care provider if: °· You leak urine. °· You have redness, swelling, or pain around your catheter. °· You have fluid or blood coming from your catheter  opening. °· Your catheter opening feels warm to the touch. °· You have pus or a bad smell coming from your catheter opening. °· You have a fever or chills. °· Your urine flow slows down. °· Your urine becomes cloudy or smelly. °Get help right away if: °· Your catheter comes out. °· You have: °? Nausea. °? Back pain. °? Difficulty changing your catheter. °? Blood in your urine. °? No urine flow for 1 hour. °Summary °· A suprapubic catheter is a flexible tube that is used to drain urine from the bladder into a collection bag outside the body. °· Your suprapubic catheter may need to be changed every 4-6 weeks, or as recommended by your health care provider. °· Follow instructions on how to   change the catheter and how to empty and clean the collection bag. °· Always wash your hands before and after caring for your catheter and collection bag. Drink enough fluid to keep your urine pale yellow. °· Get help right away if you have difficulty changing your catheter or if there is blood in your urine. °This information is not intended to replace advice given to you by your health care provider. Make sure you discuss any questions you have with your health care provider. °Document Revised: 09/12/2018 Document Reviewed: 06/26/2018 °Elsevier Patient Education © 2020 [employer] ° °

## 2020-02-27 NOTE — Telephone Encounter (Signed)
Post ED Visit - Positive Culture Follow-up  Culture report reviewed by antimicrobial stewardship pharmacist: [facility] Team []  Elenor Quinones, Pharm.D. []  Heide Guile, Pharm.D., BCPS AQ-ID []  Parks Neptune, Pharm.D., BCPS []  Alycia Rossetti, Pharm.D., BCPS []  [place], Pharm.D., BCPS, AAHIVP []  Legrand Como, Pharm.D., BCPS, AAHIVP []  Salome Arnt, PharmD, BCPS []  Johnnette Gourd, PharmD, BCPS []  Hughes Better, PharmD, BCPS []  Leeroy Cha, PharmD []  Laqueta Linden, PharmD, BCPS []  Albertina Parr, PharmD  [facility] Team []  Leodis Sias, PharmD []  Lindell Spar, PharmD []  Royetta Asal, PharmD []  Graylin Shiver, Rph []  Rema Fendt) Glennon Mac, PharmD []  Arlyn Dunning, PharmD []  Netta Cedars, PharmD []  Dia Sitter, PharmD []  Leone Haven, PharmD [x]  Gretta Arab, PharmD []  Theodis Shove, PharmD []  Peggyann Juba, PharmD []  Reuel Boom, PharmD   Positive urine culture Treated with Sulfamethoxazole-Trimethoprim, organism sensitive to the same and no further patient follow-up is required at this time.  [facility]arlon [facility] 02/27/2020, 10:51 AM

## 2020-02-27 NOTE — Progress Notes (Signed)
[facility] clinic  Provider:  Jaymes Graff Medina-Vargas  Code Status: Full Code  Goals of Care:  Advanced Directives 02/27/2020  Does Patient Have a Medical Advance Directive? No  Does patient want to make changes to medical advance directive? -  Would patient like information on creating a medical advance directive? No - Patient declined  Pre-existing out of facility DNR order (yellow form or pink MOST form) -     Chief Complaint  Patient presents with  . Acute Visit    Complains of extreme leg swelling.    HPI: Patient is a 74 y.o. male seen today for an acute visit for a change in his antibiotic. He has a PMH of hypertension, urinary retention and suprapubic catheter. He was seen in ED on 02/23/20 for nondraining suprapubic catheter. He was given Bactrim DS BID. He is here today at [facility] asking for 40 tabs of Ciprofloxacin. He stated that Ciprofloxacin prevents obstruction of his suprapubic catheter. Discussed that 7 days supply of medications. Urine dipstick was done and showed large leukocyte, positive nitrite, large blood. Urine culture done on 02/23/20 showed >= 100,000 CFU/ml of Providencia Rettgeri and Morganella Morgan. Both isolates were sensitive to Bactrim DS and Ciprofloxacin. He denies having fever, chills nor SOB. He has BLE 2-3+edema and currently on Furosemide.No erythema noted on bilateral lower legs. Offered referral for a Home health nurse who can flush his suprapubic catheter but he refused and stated that Cipro will be able to prevent suprapubic obstruction.   Past Medical History:  Diagnosis Date  . Anemia 10/02/2019   DUE TO BLOOD LOSS  . Blind   . Disorder of bone and cartilage, unspecified   . Edema   . Elevated prostate specific antigen (PSA)   . Hypertension   . Hypopotassemia   . Nonspecific abnormal results of liver function study   . Unspecified glaucoma(365.9)     Past Surgical History:  Procedure Laterality Date  . CYSTOSCOPY WITH DIRECT VISION INTERNAL  URETHROTOMY N/A 10/02/2019   Procedure: CYSTOSCOPY WITH DIRECT VISION INTERNAL URETHROTOMY WITH CLOT EVACUATION retrograd urethralgram Coverted to Open.;  Surgeon: Lucas Mallow, MD;  Location: [place];  Service: Urology;  Laterality: N/A;  . CYSTOSCOPY WITH FULGERATION N/A 01/17/2018   Procedure: CYSTOSCOPY CLOT EVACUATION WITH FULGERATION TUR BIOPSY OF BLADDER NECK;  Surgeon: Irine Seal, MD;  Location: [facility];  Service: Urology;  Laterality: N/A;  . CYSTOSTOMY N/A 10/02/2019   Procedure: Open Cystostomy Suprapubic tube placement and JP drain and urethral dilation;  Surgeon: Lucas Mallow, MD;  Location: [place];  Service: Urology;  Laterality: N/A;  . EYE SURGERY Bilateral 2009   for glaucoma  . EYE SURGERY Left 2014  . HERNIA REPAIR Right 2003   Dr. Jeraldine Loots  . IR ANGIOGRAM EXTREMITY RIGHT  10/03/2019  . IR ANGIOGRAM PELVIS SELECTIVE OR SUPRASELECTIVE  10/03/2019  . IR ANGIOGRAM PELVIS SELECTIVE OR SUPRASELECTIVE  10/03/2019  . IR ANGIOGRAM SELECTIVE EACH ADDITIONAL VESSEL  10/03/2019  . IR ANGIOGRAM SELECTIVE EACH ADDITIONAL VESSEL  10/03/2019  . IR ANGIOGRAM SELECTIVE EACH ADDITIONAL VESSEL  10/03/2019  . IR EMBO ART  VEN HEMORR LYMPH EXTRAV  INC GUIDE ROADMAPPING  10/03/2019  . IR US GUIDE VASC ACCESS RIGHT  10/03/2019  . LAPAROSCOPIC APPENDECTOMY N/A 12/24/2012   Procedure:  LAPAROSCOPIC APPENDECTOMY ;  Surgeon: Pedro Earls, MD;  Location: [facility];  Service: General;  Laterality: N/A;  . TRANSURETHRAL RESECTION OF PROSTATE N/A 05/27/2019   Procedure: Cystoscopy  Clot Evacuation and Fulgration;  Surgeon: Ardis Hughs, MD;  Location: [facility];  Service: Urology;  Laterality: N/A;    No Known Allergies  Outpatient Encounter Medications as of 02/27/2020  Medication Sig  . Ascorbic Acid (VITAMIN C WITH ROSE HIPS) 1000 MG tablet Take 1,000 mg by mouth daily with lunch.  Marland Kitchen aspirin-acetaminophen-caffeine (EXCEDRIN MIGRAINE) 250-250-65 MG tablet Take 1 tablet by mouth daily as  needed (pain).   . brimonidine (ALPHAGAN) 0.2 % ophthalmic solution Place 1 drop into both eyes 2 (two) times daily.  . dorzolamide-timolol (COSOPT) 22.3-6.8 MG/ML ophthalmic solution APPOINTMENT OVERDUE, instill 1 drop in both eyes twice daily  . folic acid (FOLVITE) 417 MCG tablet Take 800 mcg by mouth as directed. Once a week or periodically  . furosemide (LASIX) 20 MG tablet Take 20 mg by mouth daily.  . hydrochlorothiazide (MICROZIDE) 12.5 MG capsule TAKE 1 CAPSULE(12.5 MG) BY MOUTH DAILY  . Magnesium 250 MG TABS Take 250 mg by mouth daily as needed (migraine headache).   . pilocarpine (PILOCAR) 2 % ophthalmic solution Place 1 drop into the right eye every 6 (six) hours.   . potassium chloride SA (KLOR-CON) 20 MEQ tablet TAKE 1 TABLET(20 MEQ) BY MOUTH DAILY  . pyridOXINE (VITAMIN B-6) 50 MG tablet Take 50 mg by mouth daily with lunch.   . sulfamethoxazole-trimethoprim (BACTRIM DS) 800-160 MG tablet Take 1 tablet by mouth 2 (two) times daily for 10 days.  . tamsulosin (FLOMAX) 0.4 MG CAPS capsule Take 0.4 mg by mouth daily.  . vitamin B-12 (CYANOCOBALAMIN) 1000 MCG tablet Take 1,000 mcg by mouth daily with lunch.  . [DISCONTINUED] finasteride (PROSCAR) 5 MG tablet Take 5 mg by mouth daily.   . [DISCONTINUED] furosemide (LASIX) 20 MG tablet Take 1 tablet (20 mg total) by mouth daily.  . [DISCONTINUED] metoprolol tartrate (LOPRESSOR) 25 MG tablet Take 0.5 tablets (12.5 mg total) by mouth 2 (two) times daily.   No facility-administered encounter medications on file as of 02/27/2020.    Review of Systems:  Review of Systems  Constitutional: Negative for activity change, appetite change, chills and fever.  HENT: Negative for congestion and mouth sores.   Eyes: Negative for pain, discharge and itching.       Legally blind  Respiratory: Negative for cough, chest tightness and shortness of breath.   Cardiovascular: Positive for leg swelling.       "inherited lower leg edema"    Gastrointestinal: Negative for abdominal distention, abdominal pain, constipation, nausea and vomiting.  Endocrine: Negative.   Genitourinary: Negative for flank pain.       Chronic suprapubic catheter  Musculoskeletal: Negative.  Negative for back pain.  Skin: Negative for wound.  Allergic/Immunologic: Negative for environmental allergies.  Neurological: Negative for numbness and headaches.    Health Maintenance  Topic Date Due  . INFLUENZA VACCINE  Never done  . COLONOSCOPY  02/15/2026 (Originally 03/06/1996)  . TETANUS/TDAP  02/15/2026 (Originally 03/06/1965)  . PNA vac Low Risk Adult (2 of 2 - PPSV23) 07/01/2020  . COVID-19 Vaccine  Completed  . Hepatitis C Screening  Completed    Physical Exam: Vitals:   02/27/20 1249  BP: (!) 150/100  Pulse: 67  Resp: 16  Temp: 97.7 F (36.5 C)  SpO2: 90%  Weight: 196 lb (88.9 kg)  Height: 5\' 8"  (1.727 m)   Body mass index is 29.8 kg/m. Physical Exam Constitutional:      Appearance: He is obese.  HENT:     Head: Normocephalic  and atraumatic.     Mouth/Throat:     Mouth: Mucous membranes are moist.  Eyes:     Conjunctiva/sclera: Conjunctivae normal.  Cardiovascular:     Rate and Rhythm: Normal rate and regular rhythm.     Pulses: Normal pulses.     Heart sounds: Normal heart sounds.  Pulmonary:     Effort: Pulmonary effort is normal.     Breath sounds: Normal breath sounds.  Abdominal:     General: Bowel sounds are normal.  Genitourinary:    Comments: Has suprapubic catheter  Musculoskeletal:        General: Swelling present. No tenderness. Normal range of motion.     Cervical back: Normal range of motion and neck supple.     Right lower leg: Edema present.     Left lower leg: Edema present.     Comments: BLE 2-3+edema  Skin:    General: Skin is warm.  Neurological:     Mental Status: He is alert and oriented to person, place, and time. Mental status is at baseline.  Psychiatric:     Comments: agitated      Labs reviewed: Basic Metabolic Panel: Recent Labs    10/02/19 1236 10/02/19 2335 10/03/19 2200 10/05/19 0252 10/05/19 1761 10/06/19 0232 10/06/19 1815 10/07/19 0243 10/08/19 2127 10/08/19 2127 10/11/19 2035 11/03/19 0057 02/21/20 1450  NA  --    < > 136   < >  --    < >  --    < > 137   < > 137 138 141  K  --    < > 4.1   < >  --    < >  --    < > 4.1   < > 3.7 4.1 3.6  CL  --    < > 105   < >  --    < >  --    < > 105   < > 104 102 103  CO2  --    < > 23   < >  --    < >  --    < > 20*  --  21*  --  25  GLUCOSE  --    < > 162*   < >  --    < >  --    < > 113*   < > 107* 108* 85  BUN  --    < > 25*   < >  --    < >  --    < > 18   < > 14 24* 18  CREATININE  --    < > 1.27*   < >  --    < >  --    < > 1.19   < > 1.04 1.10 0.75  CALCIUM  --    < > 7.9*   < >  --    < >  --    < > 8.0*  --  7.9*  --  8.8*  MG 1.9  --  1.9  --  1.9  --  1.8  --   --   --   --   --   --   PHOS 5.0*  --   --   --   --   --   --   --   --   --   --   --   --   TSH 2.205  --   --   --   --   --   --   --   --   --   --   --   --    < > =  values in this interval not displayed.   Liver Function Tests: Recent Labs    10/02/19 0953 10/03/19 0502 02/21/20 1450  AST 46* 29 32  ALT 35 24 28  ALKPHOS 95 58 82  BILITOT 0.8 1.4* 1.0  PROT 6.5 4.7* 6.9  ALBUMIN 3.1* 2.2* 3.5   CBC: Recent Labs    10/09/19 0310 10/09/19 0310 10/11/19 2035 10/11/19 2035 10/17/19 1337 11/03/19 0057 02/21/20 1450  WBC 10.6*   < > 10.8*  --  11.7*  --  5.4  NEUTROABS 8.4*  --   --   --  9,688*  --  3.8  HGB 8.3*   < > 7.7*   < > 8.3* 9.2* 9.4*  HCT 28.2*   < > 25.7*   < > 26.6* 27.0* 31.9*  MCV 85.5   < > 83.2  --  78.0*  --  72.0*  PLT 328   < > 356  --  459*  --  268   < > = values in this interval not displayed.   Lipid Panel: Recent Labs    07/02/19 1446  CHOL 191  HDL 40  TRIG 514*  CHOLHDL 4.8    Assessment/Plan  1. Acute cystitis without hematuria -  Will discontinue Bactrim DS and  start on Ciprofloxacin - ciprofloxacin (CIPRO) 500 MG tablet; Take 1 tablet (500 mg total) by mouth 2 (two) times daily for 7 days.  Dispense: 14 tablet; Refill: 0  2. Suprapubic catheter [facility]) Doesn't want a home health nurse referral since his suprapubic catheter is new and is not clogged - he declines referral to Home health nurse  3. Lower extremity edema - continue Lasix 20 mg daily -  Instructed to elevate feet/legs when sitting for a long period and elevate feet at night.   Labs/tests ordered:  Urine culture and sensitivity  Next appt:  04/23/2020

## 2020-02-29 LAB — URINE CULTURE
MICRO NUMBER:: 10996695
SPECIMEN QUALITY:: ADEQUATE

## 2020-03-01 ENCOUNTER — Telehealth: Payer: Self-pay

## 2020-03-01 NOTE — Addendum Note (Signed)
Addended by: Durenda Age C on: 03/01/2020 02:53 PM   Modules accepted: Orders

## 2020-03-01 NOTE — Telephone Encounter (Signed)
Message left on clinical intake voicemail:   Lauren from [facility] called stating she received a call from patient requesting someone come out and assist with flushing his catheter.  Per Ander Purpura patients PCP will need to place a home health referral order and fax it to [phone] along with face-to face visit that supports his need for home health

## 2020-03-03 ENCOUNTER — Other Ambulatory Visit: Payer: Self-pay | Admitting: *Deleted

## 2020-03-03 ENCOUNTER — Other Ambulatory Visit: Payer: Self-pay

## 2020-03-03 ENCOUNTER — Telehealth: Payer: Self-pay | Admitting: Nurse Practitioner

## 2020-03-03 NOTE — Patient Outreach (Signed)
[facility]) Care Management  03/03/2020  Thos Matsumoto. March 15, 1946 [id number]   [facility] Telephone Assessment/Screen forpost hospital complex care Referral Date:10/13/19 Referral Source:[facility] hospital liaison, Natividad Brood Referral Reason:High ED utilizer in the united healthcare medicare [facility]: Please assign to [place] care coordinator for complex care, PCP does TOC and disease management follow calls and support needs assess for further needs Insurance:united healthcare medicare    Unsuccessful outreach  No answer. [facility] RN CM left HIPAA [facility] Portability and Accountability Act) compliant voicemail message along with CM's contact info  Return call from patient shortly after message left Patient is able to verify HIPAA ([place] and Accountability Act) identifiers Reviewed and addressed the purpose of the follow up call with the patient  Consent: [facility] RN CM reviewed [facility] services with patient. Patient gave verbal consent for services.      Follow assessment [facility] RN CM assessed concerns during recent ED visit for urinary retention His catheter was flushed by a ED nurse and began to freely drain urine. ED PA- C also noted Mr Shibata had not filled his previously ordered Keflex after a 01/27/20 ED visit. 02/23/20 culture indicated a growth of morganella morganii and proteus mirabilis He was prescribe bactrim Mr Pastorino reports clotting, non draining of his suprapubic catheter  Patient intervention for urinary retention He reports he had outreached to united healthcare about in network providers and then to his primary care provider (PCP) office  to request a Home health [facility]) agency referral to get a possible once a week flushing of his suprapubic catheter per his preference to prevent clotting of foley He states he prefers and has requested [place] home health  Per 03/01/20 Epic notes Mr Petillo also  [facility] staff called pcp with attempt to get a referral to see patient  Offered resources assistance  [facility] RN CM inquire about going to urologist office instead but his preference is have home health Inquired if he has flushed his own catheter but he acknowledges he has flushed his catheter himself before but reports he prefers someone else to flush it as "I may not be doing it right" [facility] RN CM has previously sent EMMI on how to care for your suprapubic catheter and urinary retention in June 2021   He reports he longer goes to and prefers not to return to [facility] when [place] assessed more about his urologist services [facility] RN CM began to inquire if [facility] RN CM had permission to have Dr Dewaine Oats assist with a referral to another urology group. Discussed with him that there are in network urology groups in [place] that could be found on line for united health care. [facility] RN CM offered to review them with him He reports he could speak with united health care about another in network urology group.  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 He reports he memorizes familiar information needed.He reports living in his apartment for years and is able to maneuver in it  by memory [employer] he was an Engineer, site and history major. He has supports of [place] services He has some assistance from his brother  He reports receiving $1300/month  Conditions acute kidney injury, Benign prostatic hyperplasia (BPH), urinary retentions, thrombocytosis,  legally blind, edema, chest pain, hypochromic microcytic anemia, hypokalemia, severe primary open angle stage glaucoma, hematuria, senile nuclear sclerosis, right corneal scar, Hypertension (HTN) DMEcane  Fallsnone reported  Appointments 07/05/20 [name] NPprimary care provider (PCP)  Plans  [facility] RN CM will attempt to outreach to patient if no return call within the next 14-21 business days  Routed note to MD Goals Addressed              This Visit's Progress     Patient Stated   .  [facility]) Find Help in My Community (pt-stated)   On track     Follow Up Date 03/17/20    - follow-up on any referrals for help I am given    Why is this important?   Knowing how and where to find help for yourself or family in your neighborhood and community is an important skill.  You will want to take some steps to learn how.    Notes: pcp office referral to home health services  Pt prefers [place]     .  [facility]) Patient will be able to manage his hypertenstion and suprapubic catheter urinary symptoms at home (pt-stated)   Not on track     [place] (see longitudinal plan of care for additional care plan information)  Objective:  . Last practice recorded BP readings:  BP Readings from Last 3 Encounters:  02/27/20 (!) 150/100  02/23/20 (!) 161/89  02/23/20 (!) 149/84 .   Marland Kitchen Most recent eGFR/CrCl: No results found for: EGFR  No components found for: CRCL  Current Barriers:  Marland Kitchen Knowledge deficit related to self care management of hypertension . Vision or hearing barriers- Legally blind  Case Manager Clinical Goal(s):  Marland Kitchen Over the next 31 days, patient will not experience hospital admission. Hospital Admissions in last 6 months = 1 . Over the next 45 days, patient will demonstrate improved adherence to prescribed treatment plan for hypertension as evidenced by taking all medications as prescribed, monitoring and recording blood pressure as directed, adhering to low sodium/DASH  diet- 03/01/20 NOT met further ED visits, BP not managed  . Over the next 90 days patient will demonstrate improved adherence to prescribed treatment plan for suprapubic catheter home care as evidenced by decrease in emergency room visits per EMR 03/01/20 NOT met further ED visits . Over the next 14 days patient will have food and resources to use monthly to obtain food as evidenced by patient verbally verifying he has food during [place]- 02/02/20 MET received food with assist of [place] [employer] . Over the next 14 days patient will be offered update related to his availability to seek further services from his preferred company [employer] as evidenced by patient verbalizing during outreach his available options MET-02/02/20 Pt updated on the response after outreach to [employer] today. He voiced understanding   Interventions:  . Evaluation of current treatment plan related to hypertension self management and patient's adherence to plan as established by provider. . Reviewed medications with patient and discussed importance of compliance . Discussed plans with patient for ongoing care management follow up and provided patient with direct contact information for care management team . Reviewed scheduled/upcoming provider appointments including: pcp, urology .  Discussed personal care services, offered [facility] referral but he has initiated outreaches to agencies . Assessed ED visits to attempt to identify care coordination needs . Offered unknown Engineer, drilling on PARTS . Listen to Mr Lienhard ventilate his feelings . Delivered 3 bags of food to patient provided by  360 staff [place] on 02/02/20 . Provided him with a list of local [place] & resources  . Updated patient about the response from an outreach to [employer], Janett Billow and his options . Offer in network urology groups & offer to collaborate with pcp on new urology  group  Patient Self Care Activities:  . Self administers medications as prescribed . Attends all scheduled provider appointments . Calls provider office for new concerns, questions, or BP outside discussed parameters . Monitors BP and records as discussed . Adheres to a low sodium diet/DASH diet . Increase physical activity as tolerated . Verbalize where to go to receive medical care . Use resources for future food pantry items . Outreach for further personal care services independently  . Pt will follow up on referrals  Please see past updates related to this goal by clicking on the "Past Updates" button in the selected goal  Please see other previous [facility] care plan information listed in Epic under the flow sheet section          Simar Pothier L. Lavina Hamman, RN, BSN, [place] Coordinator Office number [phone] Main [facility] number [phone] Fax number [phone]

## 2020-03-03 NOTE — Telephone Encounter (Signed)
Patient wants to have a referral for [facility] to flush his catheter.  He states that he has been to the ER eight times in the last couple of weeks to have catheter flushed.  He states that this is very costly to him.  Please call [facility] at [phone] to make the referral.  Thank you  [name] [name]

## 2020-03-03 NOTE — Patient Outreach (Signed)
[facility]) Care Management  03/03/2020  [name] [name]. [birth year] [id number]   [facility] Unsuccessful outreach   Outreach attempt to the home number  No answer. [facility] RN CM left HIPAA [facility] Portability and Accountability Act) compliant voicemail message along with CM's contact info.   Plan: [facility] RN CM scheduled this patient for another call attempt within 4 -7 business days  [name] Lheureux L. Lavina Hamman, RN, BSN, [place] Coordinator Office number [phone] Mobile number [phone]  Main [facility] number [phone] Fax number [phone]

## 2020-03-04 ENCOUNTER — Telehealth: Payer: Self-pay | Admitting: Nurse Practitioner

## 2020-03-04 NOTE — Telephone Encounter (Signed)
Noted, awaiting reply from [name], NP

## 2020-03-04 NOTE — Telephone Encounter (Signed)
He will need to check with his benefits to see if they provide this and if so to provide [place] of any paperwork we need to complete. He will likely need an appt to complete the paperwork- can be done via telephone visit

## 2020-03-04 NOTE — Telephone Encounter (Signed)
[name] [name] called requesting a referral to [facility] for some lite custodial help at his apartment, such as vacuum & cleaning.  I explained that with [facility] coming in to flush his foley cath that insurance may not approve & pay for 2 [place] with 2 different services.  However [name] [name] has made several phone calls himself & lined these services up, stating he only needs referrals for his PCP.  Thanks, Kathyrn Lass

## 2020-03-05 NOTE — Telephone Encounter (Signed)
[name] [name] please follow through with patient.  Thanks, S.Chrae B/CMA

## 2020-03-10 ENCOUNTER — Telehealth: Payer: Self-pay

## 2020-03-10 NOTE — Telephone Encounter (Signed)
Patient called requesting to speak with [name] [name]. Patient is requesting a status update on his referral to [facility].  Patient was offered to take their number and states he is visually impaired and needs [name] [name] to follow through for he has not heard from [place]

## 2020-03-10 NOTE — Telephone Encounter (Signed)
Spoke with patient, patient states his insurance company will not cover [employer] help, he called and they told him he would have to pay out of pocket.  Patient states we can cancel his request for that service

## 2020-03-10 NOTE — Telephone Encounter (Signed)
Left message on voicemail for patient to return call when available   

## 2020-03-11 DIAGNOSIS — R6889 Other general symptoms and signs: Secondary | ICD-10-CM | POA: Diagnosis not present

## 2020-03-11 NOTE — Telephone Encounter (Signed)
Per verbal conversation with Lattie Haw refer to referral notes. [facility] was unable to take patient and patients referral has now been sent to a different company

## 2020-03-14 ENCOUNTER — Emergency Department (HOSPITAL_COMMUNITY)
Admission: EM | Admit: 2020-03-14 | Discharge: 2020-03-14 | Disposition: A | Discharge status: Home / Self Care | Payer: Medicare Other | Attending: Emergency Medicine | Admitting: Emergency Medicine

## 2020-03-14 ENCOUNTER — Encounter (HOSPITAL_COMMUNITY): Payer: Self-pay | Admitting: Emergency Medicine

## 2020-03-14 ENCOUNTER — Other Ambulatory Visit: Payer: Self-pay

## 2020-03-14 DIAGNOSIS — I10 Essential (primary) hypertension: Secondary | ICD-10-CM | POA: Diagnosis not present

## 2020-03-14 DIAGNOSIS — T83518A Infection and inflammatory reaction due to other urinary catheter, initial encounter: Secondary | ICD-10-CM | POA: Diagnosis present

## 2020-03-14 DIAGNOSIS — R339 Retention of urine, unspecified: Secondary | ICD-10-CM | POA: Insufficient documentation

## 2020-03-14 DIAGNOSIS — T83511A Infection and inflammatory reaction due to indwelling urethral catheter, initial encounter: Secondary | ICD-10-CM | POA: Insufficient documentation

## 2020-03-14 DIAGNOSIS — Z79899 Other long term (current) drug therapy: Secondary | ICD-10-CM | POA: Diagnosis not present

## 2020-03-14 DIAGNOSIS — N390 Urinary tract infection, site not specified: Secondary | ICD-10-CM | POA: Diagnosis not present

## 2020-03-14 DIAGNOSIS — Z7982 Long term (current) use of aspirin: Secondary | ICD-10-CM | POA: Insufficient documentation

## 2020-03-14 DIAGNOSIS — Z87891 Personal history of nicotine dependence: Secondary | ICD-10-CM | POA: Diagnosis not present

## 2020-03-14 DIAGNOSIS — T83010A Breakdown (mechanical) of cystostomy catheter, initial encounter: Secondary | ICD-10-CM

## 2020-03-14 DIAGNOSIS — R319 Hematuria, unspecified: Secondary | ICD-10-CM | POA: Diagnosis not present

## 2020-03-14 DIAGNOSIS — Z743 Need for continuous supervision: Secondary | ICD-10-CM | POA: Diagnosis not present

## 2020-03-14 LAB — URINALYSIS, ROUTINE W REFLEX MICROSCOPIC
Bilirubin Urine: NEGATIVE
Glucose, UA: NEGATIVE mg/dL
Ketones, ur: NEGATIVE mg/dL
Nitrite: POSITIVE — AB
Protein, ur: 30 mg/dL — AB
Specific Gravity, Urine: 1.011 (ref 1.005–1.030)
WBC, UA: >50 WBC/hpf — ABNORMAL HIGH (ref 0–5)
pH: 6 (ref 5.0–8.0)

## 2020-03-14 LAB — BASIC METABOLIC PANEL
Anion gap: 8 (ref 5–15)
BUN: 20 mg/dL (ref 8–23)
CO2: 26 mmol/L (ref 22–32)
Calcium: 8.7 mg/dL — ABNORMAL LOW (ref 8.9–10.3)
Chloride: 106 mmol/L (ref 98–111)
Creatinine, Ser: 0.81 mg/dL (ref 0.61–1.24)
GFR, Estimated: >60 mL/min (ref >60)
Glucose, Bld: 83 mg/dL (ref 70–99)
Potassium: 3.1 mmol/L — ABNORMAL LOW (ref 3.5–5.1)
Sodium: 140 mmol/L (ref 135–145)

## 2020-03-14 MED ORDER — ACETAMINOPHEN 500 MG PO TABS
1000.0000 mg | ORAL_TABLET | Freq: Once | ORAL | Status: AC
  Administered 2020-03-14: 1000 mg via ORAL
  Filled 2020-03-14: qty 2

## 2020-03-14 MED ORDER — PHENAZOPYRIDINE HCL 100 MG PO TABS
95.0000 mg | ORAL_TABLET | Freq: Once | ORAL | Status: AC
  Administered 2020-03-14: 100 mg via ORAL
  Filled 2020-03-14: qty 1

## 2020-03-14 MED ORDER — SULFAMETHOXAZOLE-TRIMETHOPRIM 800-160 MG PO TABS
1.0000 | ORAL_TABLET | Freq: Once | ORAL | Status: AC
  Administered 2020-03-14: 1 via ORAL
  Filled 2020-03-14: qty 1

## 2020-03-14 MED ORDER — SULFAMETHOXAZOLE-TRIMETHOPRIM 800-160 MG PO TABS: 1.0000 | ORAL_TABLET | Freq: Two times a day (BID) | ORAL | 0 refills | Status: DC

## 2020-03-14 NOTE — ED Provider Notes (Signed)
[facility] EMERGENCY DEPARTMENT Provider Note   CSN: [id number] Arrival date & time: 03/14/20  3557     History Chief Complaint  Patient presents with  . foley issues    [name] [name]. is a 74 y.o. male.  74 year old man with suprapubic urinary catheter presents today with 7 days of catheter block.  He reports having had a suprapubic catheter for 1 week.  He occasionally becomes blocked.  The last visit to the ED for blockage was 1 month ago.  He denies any fever, chills, pain, n/v.  He still can make some urine via penis, describes it as dribbling.  Last urine culture 1 month ago showed [name] [name] ER, [name] [name] that was sensitive to Cipro, Bactrim.  Reports last antibiotic he took was Cipro about a month ago, although Bactrim was prescribed to him from the ED.  He requests home health RN for assistance with catheter.  He states that he does not have PCP or urology at this time.  Chart review shows that [facility] seen [facility] in the past and prefers not to go there.  PCP listed is [name] Oats NP.       Past Medical History:  Diagnosis Date  . Anemia 10/02/2019   DUE TO BLOOD LOSS  . Blind   . Disorder of bone and cartilage, unspecified   . Edema   . Elevated prostate specific antigen (PSA)   . Hypertension   . Hypopotassemia   . Nonspecific abnormal results of liver function study   . Unspecified glaucoma(365.9)     Patient Active Problem List   Diagnosis Date Noted  . Swelling of lower leg 10/02/2019  . 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 Thrombocytosis 09/13/2019  . Urinary retention 09/13/2019  . AKI (acute kidney injury) ([place]) 05/25/2019  . BPH (benign prostatic hyperplasia) 05/25/2019  . Anemia due to blood loss, chronic   . Acute blood loss anemia 01/14/2018  . Hypokalemia 01/14/2018  . 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 Hematuria 12/23/2012  . Chest pain 12/23/2012  . Anemia due to blood loss, acute 12/23/2012  . Elevated prostate  specific antigen (PSA)   . Hypertension   . Edema   . Severe stage glaucoma 08/14/2011  . 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 CYSTOSCOPY WITH DIRECT VISION INTERNAL URETHROTOMY N/A 10/02/2019   Procedure: CYSTOSCOPY WITH DIRECT VISION INTERNAL URETHROTOMY WITH CLOT EVACUATION retrograd urethralgram Coverted to Open.;  Surgeon: Lucas Mallow, MD;  Location: [facility];  Service: Urology;  Laterality: N/A;  . CYSTOSCOPY WITH FULGERATION N/A 01/17/2018   Procedure: CYSTOSCOPY CLOT EVACUATION WITH FULGERATION TUR BIOPSY OF BLADDER NECK;  Surgeon: Irine Seal, MD;  Location: [facility];  Service: Urology;  Laterality: N/A;  . CYSTOSTOMY N/A 10/02/2019   Procedure: Open Cystostomy Suprapubic tube placement and JP drain and urethral dilation;  Surgeon: Lucas Mallow, MD;  Location: [facility];  Service: Urology;  Laterality: N/A;  . EYE SURGERY Bilateral 2009   for glaucoma  . EYE SURGERY Left 2014  . HERNIA REPAIR Right 2003   Dr. Jeraldine Loots  . IR ANGIOGRAM EXTREMITY RIGHT  10/03/2019  . IR ANGIOGRAM PELVIS SELECTIVE OR SUPRASELECTIVE  10/03/2019  . IR ANGIOGRAM PELVIS SELECTIVE OR SUPRASELECTIVE  10/03/2019  . IR ANGIOGRAM SELECTIVE EACH ADDITIONAL VESSEL  10/03/2019  . IR ANGIOGRAM SELECTIVE EACH ADDITIONAL VESSEL  10/03/2019  .  IR ANGIOGRAM SELECTIVE EACH ADDITIONAL VESSEL  10/03/2019  . IR EMBO ART  VEN HEMORR LYMPH EXTRAV  INC GUIDE ROADMAPPING  10/03/2019  . IR US GUIDE VASC ACCESS RIGHT  10/03/2019  . LAPAROSCOPIC APPENDECTOMY N/A 12/24/2012   Procedure:  LAPAROSCOPIC APPENDECTOMY ;  Surgeon: Pedro Earls, MD;  Location: [facility];  Service: General;  Laterality: N/A;  . TRANSURETHRAL RESECTION OF PROSTATE N/A 05/27/2019   Procedure: Cystoscopy Clot Evacuation and Fulgration;  Surgeon: Ardis Hughs, MD;  Location: [facility];  Service: Urology;   Laterality: N/A;       Family History  Problem Relation Age of Onset  . Kidney failure Mother     Social History   Tobacco Use  . Smoking status: Former Smoker    Types: Cigarettes    Quit date: 06/05/1968    Years since quitting: 51.8  . Smokeless tobacco: Never Used  Vaping Use  . Vaping Use: Never used  Substance Use Topics  . Alcohol use: No    Comment: STOPPED IN 1970S  . Drug use: No    Home Medications Prior to Admission medications   Medication Sig Start Date End Date Taking? Authorizing Provider  Ascorbic Acid (VITAMIN C WITH ROSE HIPS) 1000 MG tablet Take 1,000 mg by mouth daily with lunch.    [provider]  aspirin-acetaminophen-caffeine (EXCEDRIN MIGRAINE) [phone] MG tablet Take 1 tablet by mouth daily as needed (pain).     [provider]  brimonidine (ALPHAGAN) 0.2 % ophthalmic solution Place 1 drop into both eyes 2 (two) times daily. 03/11/19   [provider]  dorzolamide-timolol (COSOPT) 22.3-6.8 MG/ML ophthalmic solution APPOINTMENT OVERDUE, instill 1 drop in both eyes twice daily 07/14/13   [name], NP  folic acid (FOLVITE) 397 MCG tablet Take 800 mcg by mouth as directed. Once a week or periodically    [provider]  furosemide (LASIX) 20 MG tablet Take 20 mg by mouth daily.    [provider]  hydrochlorothiazide (MICROZIDE) 12.5 MG capsule TAKE 1 CAPSULE(12.5 MG) BY MOUTH DAILY 12/25/19   [name], NP  Magnesium 250 MG TABS Take 250 mg by mouth daily as needed (migraine headache).     [provider]  pilocarpine (PILOCAR) 2 % ophthalmic solution Place 1 drop into the right eye every 6 (six) hours.  12/25/17   [provider]  potassium chloride SA (KLOR-CON) 20 MEQ tablet TAKE 1 TABLET(20 MEQ) BY MOUTH DAILY 12/25/19   [name], NP  pyridOXINE (VITAMIN B-6) 50 MG tablet Take 50 mg by mouth daily with lunch.     [provider]  tamsulosin (FLOMAX) 0.4 MG  CAPS capsule Take 0.4 mg by mouth daily. 10/28/19   [provider]  vitamin B-12 (CYANOCOBALAMIN) 1000 MCG tablet Take 1,000 mcg by mouth daily with lunch.    [provider]    Allergies    Patient has no known allergies.  Review of Systems   Review of Systems  Constitutional: Negative for activity change, chills and fever.  Gastrointestinal: Negative for abdominal pain, diarrhea, nausea and vomiting.  Genitourinary: Positive for difficulty urinating. Negative for discharge, dysuria, flank pain, frequency, penile pain and urgency.  Musculoskeletal: Negative for arthralgias and myalgias.  All other systems reviewed and are negative.   Physical Exam Updated Vital Signs BP 132/75   Pulse (!) 56   Temp (!) 97.4 F (36.3 C) (Oral)   Resp 16   Ht 5\' 8"  (1.727  m)   Wt 88.5 kg   SpO2 100%   BMI 29.65 kg/m   Physical Exam Vitals and nursing note reviewed.  Constitutional:      General: He is not in acute distress.    Appearance: Normal appearance. He is normal weight. He is not ill-appearing, toxic-appearing or diaphoretic.  HENT:     Head: Normocephalic and atraumatic.  Cardiovascular:     Rate and Rhythm: Normal rate and regular rhythm.     Pulses: Normal pulses.     Heart sounds: Normal heart sounds.  Pulmonary:     Effort: Pulmonary effort is normal.     Breath sounds: Normal breath sounds.  Abdominal:     General: Abdomen is flat. Bowel sounds are normal. There is no distension.     Palpations: Abdomen is soft.     Tenderness: There is no abdominal tenderness.  Genitourinary:    Penis: Normal.   Skin:    General: Skin is warm and dry.     Capillary Refill: Capillary refill takes less than 2 seconds.     Comments: Suprapubic catheter present  Neurological:     General: No focal deficit present.     Mental Status: He is alert and oriented to person, place, and time.  Psychiatric:        Mood and Affect: Mood normal.        Behavior: Behavior  normal.     ED Results / Procedures / Treatments   Labs (all labs ordered are listed, but only abnormal results are displayed) Labs Reviewed  URINALYSIS, ROUTINE W REFLEX MICROSCOPIC - Abnormal; Notable for the following components:      Result Value   APPearance CLOUDY (*)    Hgb urine dipstick SMALL (*)    Protein, ur 30 (*)    Nitrite POSITIVE (*)    Leukocytes,Ua LARGE (*)    WBC, UA >50 (*)    Bacteria, UA MANY (*)    All other components within normal limits  BASIC METABOLIC PANEL - Abnormal; Notable for the following components:   Potassium 3.1 (*)    Calcium 8.7 (*)    All other components within normal limits  URINE CULTURE    EKG None  Radiology No results found.  Procedures Procedures (including critical care time)  Medications Ordered in ED Medications  acetaminophen (TYLENOL) tablet 1,000 mg (1,000 mg Oral Given 03/14/20 1449)  phenazopyridine (PYRIDIUM) tablet 100 mg (100 mg Oral Given 03/14/20 1449)    ED Course  I have reviewed the triage vital signs and the nursing notes.  Pertinent labs & imaging results that were available during my care of the patient were reviewed by me and considered in my medical decision making (see chart for details).  Clinical Course as of Mar 14 1813  Sun Mar 14, 2020  4235 Basic metabolic panel [CM]    Clinical Course User Index [CM] Gladys Damme, MD   MDM Rules/Calculators/A&P                          74 yo man with suprapubic foley blockage and history of UTI. Tried to flush catheter without success, will replace catheter and obtain urine culture.  44 French suprapubic catheter replaced and large mucoid clot found indicating like infection. UOP after replacement improved to 800 ccs far. Will obtain UA, ur cx, BMP to check for AKI or ARF. Patient waiting for several days with full bladder. Will treat with  abx empirically based on last culture, awaiting scr.  Serum creatinine WNL. Will treat with bactrim DS  x10 days and follow up with PCP. [facility] RN order placed. Recommend he be referred to urology, patient prefers not to have [facility].  Final Clinical Impression(s) / ED Diagnoses Final diagnoses:  Urinary tract infection associated with indwelling urethral catheter, initial encounter [facility])  Suprapubic catheter dysfunction, initial encounter [facility])    Rx / [place] Orders ED Discharge Orders    None       Gladys Damme, MD 03/14/20 Kristian Covey    Isla Pence, MD 03/14/20 1900

## 2020-03-14 NOTE — Progress Notes (Signed)
   03/14/20 2011  TOC ED Mini Assessment  TOC Time spent with patient (minutes): 60  PING Used in TOC Assessment No  Admission or Readmission Diverted Yes   ED RNCM sent [facility] referral to [place] to arrange  [facility] services.  Patient lives at home at home alone patient has a suprapubic catheter which patient has been managing at home since 4/21. CM met with patient who reports he came to the ED because  issues with catheter, we discussed that he would possibly benefit from [facility] services patient states he has too weak sometimes to mess with it.  Patient is agreeable with transitional care plan. Updated Dr. Gilford Raid, she is agreeable with plan. Patient will be discharge pending [place] services. CM will follow up tomorrow, with Alvis Lemmings and [facility] Means on which agency was able to accept [facility] referral. Patient will be transported home via [facility] transportation [place] services.

## 2020-03-14 NOTE — TOC Initial Note (Signed)
Transition of Care (TOC) - Initial/Assessment Note    Patient Details  Name: [name] [name]. MRN: [id number] Date of Birth: [birth year]  Transition of Care [facility]) CM/SW Contact:    Verdell Carmine, RN Phone Number: 03/14/2020, 1:44 PM  Clinical Narrative:                  Discussed with patient. His PCP is alright, he needs a new urologist. [facility]" let him go" And he does not know why. He has tried to contact [place] and been unsuccessful to meet his needs. Contacted [place] for [facility] RN support. He will get back to me shortly. Assigned another urologist group to patient to call on Monday. In patient instructions.    Barriers to Discharge: No Home Care Agency will accept this patient (In the past-)   Patient Goals and CMS Choice   CMS Medicare.gov Compare Post Acute Care list provided to:: Patient Choice offered to / list presented to : Patient  Expected Discharge Plan and Services                                                Prior Living Arrangements/Services                       Activities of Daily Living      Permission Sought/Granted                  Emotional Assessment              Admission diagnosis:  catheter issues Patient Active Problem List   Diagnosis Date Noted  . Swelling of lower leg 10/02/2019  . 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 Thrombocytosis 09/13/2019  . Urinary retention 09/13/2019  . AKI (acute kidney injury) ([place]) 05/25/2019  . BPH (benign prostatic hyperplasia) 05/25/2019  . Anemia due to blood loss, chronic   . Acute blood loss anemia 01/14/2018  . Hypokalemia 01/14/2018  . 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 Hematuria 12/23/2012  . Chest pain 12/23/2012  . Anemia due to blood loss, acute 12/23/2012  . Elevated prostate specific antigen (PSA)   . Hypertension   . Edema   . Severe stage glaucoma 08/14/2011  . 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 Right corneal scar  08/14/2011  . 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

## 2020-03-14 NOTE — TOC Progression Note (Signed)
Transition of Care (TOC) - Progression Note    Patient Details  Name: [name] [name]. MRN: [id number] Date of Birth: [birth year]  Transition of Care [facility]) CM/SW [place], RN Phone Number: 03/14/2020, 4:59 PM  Clinical Narrative:     [facility] Dace ask office if they can take patient until the MD writes a note.  We will try other Home health agencies for RN for this patient.     Barriers to Discharge: No [place] will accept this patient (In the past-)  Expected Discharge Plan and Services     Discharge Planning Services: CM Consult                               HH Arranged: RN [facility] Agency: [place] Date [place]: 03/14/20 Time [place]: 1416 Representative spoke with at [place]: [place] (SDOH) Interventions    Readmission Risk Interventions No flowsheet data found.

## 2020-03-14 NOTE — Discharge Instructions (Addendum)
It was a pleasure to be part of your care today. You were seen and treated for a blockage of your urinary catheter as well as a urinary tract infection. Please take bactrim, an antibiotic, twice a day for 10 days. We also collected a urine culture, if you need different antibiotics, we will call you and let you know those results. We recommend you follow up with your PCP in several days to make sure you are recovering and to refer you to see a urologist.  We referred you for home health services, a company that provides services will be in contact with you to set up home nursing visits for your catheter.  If you have trouble urinating, fever, chills, pain in your back or bladder that does not improve, please let your primary care provider know and schedule an appointment or return to the ED.

## 2020-03-14 NOTE — ED Provider Notes (Signed)
  Physical Exam  BP (!) 132/104 (BP Location: Left Arm)   Pulse 70   Temp 97.8 F (36.6 C) (Oral)   Resp 18   SpO2 98%   Physical Exam  ED Course/Procedures     BLADDER CATHETERIZATION  Date/Time: 03/14/2020 7:02 PM Performed by: Isla Pence, MD Authorized by: Isla Pence, MD   Consent:    Consent obtained:  Verbal   Consent given by:  Patient   Risks discussed:  Pain   Alternatives discussed:  No treatment Pre-procedure details:    Procedure purpose:  Therapeutic Anesthesia (see MAR for exact dosages):    Anesthesia method:  None Procedure details:    Provider performed due to:  Complicated insertion   Catheter insertion:  Indwelling   Catheter type:  Foley   Catheter size:  22 Fr   Bladder irrigation: no     Number of attempts:  1   Urine characteristics:  Cloudy Post-procedure details:    Patient tolerance of procedure:  Tolerated well, no immediate complications Comments:     Suprapubic catheter    MDM         Isla Pence, MD 03/14/20 1903

## 2020-03-14 NOTE — ED Triage Notes (Signed)
Pt to triage via [facility] from home.  Reports intermittent issues with foley catheter blockage over the last 7 days.  Pt states it is not draining any urine at present and he is having leakage at penis.

## 2020-03-17 ENCOUNTER — Other Ambulatory Visit: Payer: Self-pay | Admitting: *Deleted

## 2020-03-17 ENCOUNTER — Telehealth: Payer: Self-pay | Admitting: Surgery

## 2020-03-17 LAB — URINE CULTURE: Culture: >=100000 — AB

## 2020-03-17 NOTE — Patient Outreach (Signed)
[facility]) Care Management  03/17/2020  [name] [name]. 05/22/1946 [id number]   [facility] Telephone Assessment/Screen forpost hospital complex care Referral Date:10/13/19 Referral Source:[facility] hospital liaison, Natividad Brood Referral Reason:High ED utilizer in the united healthcare medicare [facility]: Please assign to [place] care coordinator for complex care, PCP does TOC and disease management follow calls and support needs assess for further needs Insurance:united healthcare medicare    Unsuccessful outreach  No answer. [facility] RN CM left HIPAA [facility] Portability and Accountability Act) compliant voicemail message along with CM's contact info  Shortly after a voice message was left Pt returned a call to [place]  Patient is able to verify HIPAA ([place]) identifiers Reviewed and addressed the purpose of the follow up ED call with the patient  Consent: [facility] RN CM reviewed [facility] services with patient. Patient gave verbal consent for services.  ED Follow up  [name] [name] 03/14/20 ED after summary visit (discharge sheets) reviewed with him. He reports he did not call to [facility] urology because he was expecting a call and he is legally blind. He reports he is not able to read the avs.   [name] [name] informs [facility] RN CM that he was informed by ED staff that a "social worker" from the hospital would outreach to him to offer assistance with getting a home health agency in his home  [name] [name] began to discuss his feelings about [facility]. [facility] RN CM redirected him back to the discussion of establishing care with another urology group. [facility] RN CM and [name] [name] have discussed  establishing care with another urology group previously but he was not preferring to attempt to go to another urology group. He agreed to allow [facility] RN CM to outreach to the recommended [facility] urology in [place]  dropped call or disconnection of [name] [name] number during the conference call with [name] [name] of [facility] urology [name] [name] confirms that there is not a [facility] urology office in [facility] only [facility]. [name] [name] was able to assist with scheduling [name] [name] an appointment with Dr Nicolette Bang on Wednesday March 24 2020 at 0930 with request he arrive 15 minutes earlier for new patient forms.  [facility] RN Cm returned a call to [name] [name]. Updated him that [name] [name] confirms that there is not [facility] in [facility] only [facility] and [name] [name] was able to schedule him an  appointment for October 20/ 2021 and offered to help him arrange transportation via his united healthcare medicare transportation benefit.  [name] [name] informs [facility] RN CM he prefers to have a home health nurse visit prior to establishing care with another urology group.  [facility] RN CM was informed of the agencies that [name] [name] states has informed him that they would not be able to assist to include [facility], [facility].  It is noted that He has also had assistance from his pcp office ED  RN Cm sent referral to [facility] and [facility] on 03/14/20  [facility] RN CM outreach to ED RN CM to review case- Pending responses from home health services    Plans  [facility] RN CM will attempt to outreach to patient if no return call within the next 14-21 business days  Routed note to MD  Goals Addressed              This Visit's Progress     Patient Stated   .  [facility]) Find Help  in My Community (pt-stated)   Not on track     Follow Up Date 03/17/20    - follow-up on any referrals for help I am given    Why is this important?   Knowing how and where to find help for yourself or family in your neighborhood and community is an important skill.  You will want to take some steps to learn how.    Notes: pcp office referral to home health services  Pt prefers [place]  03/17/20 Pt referred to various home health agencies via hospital ED  RN CM pending any possible responses from agencies Pt does not want to establish new urology services until seen by home health agency    .  COMPLETED: [facility]) Patient will be able to manage his hypertenstion and suprapubic catheter urinary symptoms at home (pt-stated)        CARE PLAN ENTRY (see longitudinal plan of care for additional care plan information)  Objective:  . Last practice recorded BP readings:  BP Readings from Last 3 Encounters:  03/14/20 120/76  02/27/20 (!) 150/100  02/23/20 (!) 161/89 .   Marland Kitchen Most recent eGFR/CrCl: No results found for: EGFR  No components found for: CRCL  Current Barriers:  Marland Kitchen Knowledge deficit related to self care management of hypertension . Vision or hearing barriers- Legally blind  Case Manager Clinical Goal(s):  Marland Kitchen Over the next 31 days, patient will not experience hospital admission. Hospital Admissions in last 6 months = 1 . Over the next 45 days, patient will demonstrate improved adherence to prescribed treatment plan for hypertension as evidenced by taking all medications as prescribed, monitoring and recording blood pressure as directed, adhering to low sodium/DASH diet- 03/01/20 NOT met further ED visits, BP not managed  . Over the next 90 days patient will demonstrate improved adherence to prescribed treatment plan for suprapubic catheter home care as evidenced by decrease in emergency room visits per EMR 03/01/20 NOT met further ED visits . Over the next 14 days patient will have food and resources to use monthly to obtain food as evidenced by patient verbally verifying he has food during [place]- 02/02/20 MET received food with assist of [place] [facility] Staff . Over the next 14 days patient will be offered update related to his availability to seek further services from his preferred company [employer] as evidenced by patient verbalizing during outreach his available options MET-02/02/20 Pt updated on the response after outreach to  [employer] today. He voiced understanding  . 03/17/20 Resolving due to duplicate goal  .   Interventions:  . Evaluation of current treatment plan related to hypertension self management and patient's adherence to plan as established by provider. . Reviewed medications with patient and discussed importance of compliance . Discussed plans with patient for ongoing care management follow up and provided patient with direct contact information for care management team . Reviewed scheduled/upcoming provider appointments including: pcp, urology . Discussed personal care services, offered [facility] SW referral but he has initiated outreaches to agencies . Assessed ED visits to attempt to identify care coordination needs . Offered unknown Engineer, drilling on PARTS . Listen to [name] [name] ventilate his feelings . Delivered 3 bags of food to patient provided by [place] [facility] staff [place] on 02/02/20 . Provided him with a list of local [place] & resources  . Updated patient about the response from an outreach to [employer], Janett Billow and his options . Offer  in network urology groups & offer to collaborate with pcp on new urology group  Patient Self Care Activities:  . Self administers medications as prescribed . Attends all scheduled provider appointments . Calls provider office for new concerns, questions, or BP outside discussed parameters . Monitors BP and records as discussed . Adheres to a low sodium diet/DASH diet . Increase physical activity as tolerated . Verbalize where to go to receive medical care . Use resources for future food pantry items . Outreach for further personal care services independently  . Pt will follow up on referrals  Please see past updates related to this goal by clicking on the "Past Updates" button in the selected goal  Please see other previous [facility] care plan information listed in Epic under the flow sheet section            Saree Krogh L. Lavina Hamman, RN, BSN, [place] Coordinator Office number [phone] [facility] number [phone] Fax number [phone]

## 2020-03-17 NOTE — Telephone Encounter (Signed)
ED CM received call from [place] concerning [place] not yet imitated since discharge on 10/10. Initial CM assessment done by Glennon Mac RN arrangement were pending with [facility] as per S. Correra.  Noting that patient had [employer] this CM with patient's permission faxed patient out to [facility], and [facility] HH. CM also contacted [facility] and [facility] who declined the referral.  This CM will re-fax [place] orders out to [facility], [facility], [facility], [facility], and [facility]. ED CM attempted to contact patient by phone to give update no answer no VM set up.  CM will also update the daytime ED CM to follow up in the am

## 2020-03-18 ENCOUNTER — Other Ambulatory Visit: Payer: Self-pay | Admitting: *Deleted

## 2020-03-18 ENCOUNTER — Telehealth: Payer: Self-pay | Admitting: *Deleted

## 2020-03-18 DIAGNOSIS — R6889 Other general symptoms and signs: Secondary | ICD-10-CM | POA: Diagnosis not present

## 2020-03-18 NOTE — Patient Outreach (Signed)
[facility]) Care Management  03/18/2020  [name] [name]. [birth year] [id number]   [facility] Care coordination- urology appointment cancellation  [facility] RN CM called to [facility] urology [phone] to have the staff cancel the scheduled appointment for 03/24/20 at 0930 [name] is not preferring to attend   Plan [facility] RN CM will attempt to outreach to patient if no return call within the next 14-21 business days Routed note to MD  [place]. Lavina Hamman, RN, BSN, [place] Coordinator Office number [phone] Mobile number [phone]  Main [facility] number [phone] Fax number [phone]

## 2020-03-18 NOTE — Telephone Encounter (Signed)
Post ED Visit - Positive Culture Follow-up: Unsuccessful Patient Follow-up  Culture assessed and recommendations reviewed by:  []  Elenor Quinones, Pharm.D. []  Heide Guile, Pharm.D., BCPS AQ-ID []  Parks Neptune, Pharm.D., BCPS []  Alycia Rossetti, Pharm.D., BCPS []  [place] [address], Pharm.D., BCPS, AAHIVP []  Legrand Como, Pharm.D., BCPS, AAHIVP []  Wynell Balloon, PharmD []  Vincenza Hews, PharmD, BCPS  Positive urine culture  []  Patient discharged without antimicrobial prescription and treatment is now indicated [x]  Organism is resistant to prescribed ED discharge antimicrobial []  Patient with positive blood cultures  Plan:  Stop Bactrim.  If any UTI symptoms, start Macrobid 100mg  BID x 5 days, Alfredia Client, PA-C  Unable to contact patient after 3 attempts, letter will be sent to address on file  Ardeen Fillers 03/18/2020, 11:14 AM

## 2020-03-18 NOTE — Progress Notes (Signed)
ED Antimicrobial Stewardship Positive Culture Follow Up   [name] [name]. is an 74 y.o. male who presented to [facility] on 03/14/2020 with a chief complaint of blocked catheter  Chief Complaint  Patient presents with  . foley issues    Recent Results (from the past 720 hour(s))  Urine culture     Status: Abnormal   Collection Time: 02/23/20  2:55 AM   Specimen: Urine, Clean Catch  Result Value Ref Range Status   Specimen Description   Final    URINE, CLEAN CATCH Performed at [facility], [place] [address] [place], [place] [address]    Special Requests   Final    NONE Performed at [facility], [place] [address] [place], [place] [address]    Culture (A)  Final    >=100,000 COLONIES/mL PROVIDENCIA RETTGERI >=100,000 COLONIES/mL MORGANELLA MORGANII    Report Status 02/26/2020 FINAL  Final   Organism ID, Bacteria PROVIDENCIA RETTGERI (A)  Final   Organism ID, Bacteria MORGANELLA MORGANII (A)  Final      Susceptibility   Morganella morganii - MIC*    AMPICILLIN >=32 RESISTANT Resistant     CEFAZOLIN >=64 RESISTANT Resistant     CIPROFLOXACIN <=0.25 SENSITIVE Sensitive     GENTAMICIN <=1 SENSITIVE Sensitive     IMIPENEM 2 SENSITIVE Sensitive     NITROFURANTOIN RESISTANT Resistant     TRIMETH/SULFA <=20 SENSITIVE Sensitive     AMPICILLIN/SULBACTAM >=32 RESISTANT Resistant     PIP/TAZO <=4 SENSITIVE Sensitive     * >=100,000 COLONIES/mL MORGANELLA MORGANII   Providencia rettgeri - MIC*    AMPICILLIN RESISTANT Resistant     CEFAZOLIN >=64 RESISTANT Resistant     CEFTRIAXONE <=0.25 SENSITIVE Sensitive     CIPROFLOXACIN <=0.25 SENSITIVE Sensitive     GENTAMICIN <=1 SENSITIVE Sensitive     IMIPENEM 1 SENSITIVE Sensitive     NITROFURANTOIN 256 RESISTANT Resistant     TRIMETH/SULFA <=20 SENSITIVE Sensitive     AMPICILLIN/SULBACTAM <=2 SENSITIVE Sensitive     PIP/TAZO <=4 SENSITIVE Sensitive     * >=100,000 COLONIES/mL PROVIDENCIA  RETTGERI  Culture, Urine     Status: None   Collection Time: 02/27/20  1:57 PM   Specimen: Urine  Result Value Ref Range Status   MICRO NUMBER: [id number]  Final   SPECIMEN QUALITY: Adequate  Final   Sample Source URINE  Final   STATUS: FINAL  Final   Result:   Final    Growth of mixed flora was isolated, suggesting probable contamination. No further testing will be performed. If clinically indicated, recollection using a method to minimize contamination, with prompt transfer to Urine Culture Transport Tube, is  recommended.   Urine culture     Status: Abnormal   Collection Time: 03/14/20  2:21 PM   Specimen: Urine, Random  Result Value Ref Range Status   Specimen Description URINE, RANDOM  Final   Special Requests NONE  Final   Culture (A)  Final    >=100,000 COLONIES/mL KLEBSIELLA SPECIES Susceptibility Pattern Suggests Possibility of an Extended Spectrum Beta Lactamase Producer. Contact Laboratory Within 7 Days if Confirmation Warranted. Performed at [facility], [place] [address] [place], [place] [address]    Report Status 03/17/2020 FINAL  Final   Organism ID, Bacteria KLEBSIELLA SPECIES (A)  Final      Susceptibility   Klebsiella species - MIC*    AMPICILLIN >=32 RESISTANT Resistant     CEFAZOLIN >=64 RESISTANT Resistant  CEFTRIAXONE >=64 RESISTANT Resistant     CIPROFLOXACIN >=4 RESISTANT Resistant     GENTAMICIN >=16 RESISTANT Resistant     IMIPENEM <=0.25 SENSITIVE Sensitive     NITROFURANTOIN 32 SENSITIVE Sensitive     TRIMETH/SULFA >=320 RESISTANT Resistant     AMPICILLIN/SULBACTAM >=32 RESISTANT Resistant     PIP/TAZO >=128 RESISTANT Resistant     * >=100,000 COLONIES/mL KLEBSIELLA SPECIES    [x]  Treated with Bactrim, organism resistant to prescribed antimicrobial. Stop Bactrim.  Call patient - if any urinary symptoms, start Macrobid 100mg  BID x 5 days.   ED Provider: Alfredia Client, PA-C   Dimple Nanas, PharmD PGY-1 Acute Care Pharmacy  Resident 03/18/2020 10:03 AM Monday - Friday phone -  [phone] Saturday - Sunday phone - [phone]

## 2020-03-19 NOTE — TOC Progression Note (Addendum)
Transition of Care (TOC) - Progression Note    Patient Details  Name: [name] [name]. MRN: [id number] Date of Birth: May 12, 1946  Transition [facility]) CM/SW Contact  Verdell Carmine, RN Phone Number: 03/19/2020, 9:45 AM  Clinical Narrative:    Received a call from [facility] stating they could not provide services to Mr Coate due to staffing concerns. He is being followed by remote health. Notes reviewed, saw that Ms Stann Mainland faxed out to Several other agencies. Called Mr. Madaline Savage from [place], to review case to see if he could provide assistance. He is checking on this and will be calling me back  1019 Drew from [facility] called back, stated they could not take this weekend due to staffing, but will resubmit on Monday. Called patient and left message     Barriers to Discharge: No [place] will accept this patient (In the past-)  Expected Discharge Plan and Services     Discharge Planning Services: CM Consult                               HH Arranged: RN [facility] Agency: [place] Date [place]: 03/14/20 Time [place]: 1416 Representative spoke with at [place]: [place] (SDOH) Interventions    Readmission Risk Interventions No flowsheet data found.

## 2020-03-21 ENCOUNTER — Telehealth: Payer: Self-pay | Admitting: Surgery

## 2020-03-21 NOTE — Telephone Encounter (Signed)
ED CM received message from [facility]oni Arth[facility], [facility], [facility], and [facility], and Nanine Means they are unable to accept  [facility] referral.  ED HH orders are  valid for 7 days.   ED CM attempted to  contact patient by phone no answer.  ED will make a 3rd attempt Monday 03/22/2020 to update patient..  CM also sent message to update K. [place] [facility] CM.

## 2020-03-21 NOTE — Progress Notes (Unsigned)
HPI: [name] [name]. is a 74 y.o. male who presents to the [facility] for Hepatitis C follow-up.  Medication: ***  Start Date: ***  Hepatitis C Genotype: 1a  Fibrosis Score: ***  Hepatitis C RNA: 1.3 million as of 02/23/20  Patient Active Problem List   Diagnosis Date Noted  . Swelling of lower leg 10/02/2019  . 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 Thrombocytosis 09/13/2019  . Urinary retention 09/13/2019  . AKI (acute kidney injury) ([place]) 05/25/2019  . BPH (benign prostatic hyperplasia) 05/25/2019  . Anemia due to blood loss, chronic   . Acute blood loss anemia 01/14/2018  . Hypokalemia 01/14/2018  . 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 Hematuria 12/23/2012  . Chest pain 12/23/2012  . Anemia due to blood loss, acute 12/23/2012  . Elevated prostate specific antigen (PSA)   . Hypertension   . Edema   . Severe stage glaucoma 08/14/2011  . 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   ASPIRIN-ACETAMINOPHEN-CAFFEINE (EXCEDRIN MIGRAINE) 250-250-65 MG TABLET    Take 1 tablet by mouth daily as needed (pain).    BRIMONIDINE (ALPHAGAN) 0.2 % OPHTHALMIC SOLUTION    Place 1 drop into both eyes 2 (two) times daily.   DORZOLAMIDE-TIMOLOL (COSOPT) 22.3-6.8 MG/ML OPHTHALMIC SOLUTION    APPOINTMENT OVERDUE, instill 1 drop in both eyes twice daily   FOLIC ACID (FOLVITE) 643 MCG TABLET    Take 800 mcg by mouth as directed. Once a week or periodically   FUROSEMIDE (LASIX) 20 MG TABLET    Take 20 mg by mouth daily.   HYDROCHLOROTHIAZIDE (MICROZIDE) 12.5 MG CAPSULE    TAKE 1 CAPSULE(12.5 MG) BY MOUTH DAILY   MAGNESIUM 250 MG TABS    Take 250 mg by mouth daily as needed (migraine headache).    PILOCARPINE  (PILOCAR) 2 % OPHTHALMIC SOLUTION    Place 1 drop into the right eye every 6 (six) hours.    POTASSIUM CHLORIDE SA (KLOR-CON) 20 MEQ TABLET    TAKE 1 TABLET(20 MEQ) BY MOUTH DAILY   PYRIDOXINE (VITAMIN B-6) 50 MG TABLET    Take 50 mg by mouth daily with lunch.    SULFAMETHOXAZOLE-TRIMETHOPRIM (BACTRIM DS) 800-160 MG TABLET    Take 1 tablet by mouth 2 (two) times daily.   TAMSULOSIN (FLOMAX) 0.4 MG CAPS CAPSULE    Take 0.4 mg by mouth daily.   VITAMIN B-12 (CYANOCOBALAMIN) 1000 MCG TABLET    Take 1,000 mcg by mouth daily with lunch.  Modified Medications   No medications on file  Discontinued Medications   No medications on file    Allergies: No Known Allergies  Past Medical History: Past Medical History:  Diagnosis Date  . Anemia 10/02/2019   DUE TO BLOOD LOSS  . Blind   . Disorder of bone and cartilage, unspecified   . Edema   . Elevated prostate specific antigen (PSA)   . Hypertension   . Hypopotassemia   . Nonspecific abnormal results of liver function study   . Unspecified glaucoma(365.9)     Social History: Social History   Socioeconomic History  . Marital status: Legally Separated    Spouse name: Not on file  . Number of children: Not on file  .  Years of education: Not on file  . Highest education level: Not on file  Occupational History  . Occupation: retired Training and development officer  Tobacco Use  . Smoking status: Former Smoker    Types: Cigarettes    Quit date: 06/05/1968    Years since quitting: 51.8  . Smokeless tobacco: Never Used  Vaping Use  . Vaping Use: Never used  Substance and Sexual Activity  . Alcohol use: No    Comment: STOPPED IN 1970S  . Drug use: No  . Sexual activity: Never  Other Topics Concern  . Not on file  Social History Narrative  . Not on file   Social Determinants of Health   Financial Resource Strain:   . Difficulty of Paying Living Expenses: Not on file  Food Insecurity: Food Insecurity Present  . Worried About Charity fundraiser in the  Last Year: Sometimes true  . Ran Out of Food in the Last Year: Sometimes true  Transportation Needs: No Transportation Needs  . Lack of Transportation (Medical): No  . Lack of Transportation (Non-Medical): No  Physical Activity:   . Days of Exercise per Week: Not on file  . Minutes of Exercise per Session: Not on file  Stress:   . Feeling of Stress : Not on file  Social Connections: Socially Isolated  . Frequency of Communication with Friends and Family: Twice a week  . Frequency of Social Gatherings with Friends and Family: Twice a week  . Attends Religious Services: Never  . Active Member of Clubs or Organizations: No  . Attends Archivist Meetings: Never  . Marital Status: Separated    Labs: Hepatitis C Lab Results  Component Value Date   HCVGENOTYPE 1a 02/23/2020   HEPCAB REACTIVE (A) 07/02/2019   HCVRNAPCRQN 1,330,000 (H) 02/23/2020   HCVRNAPCRQN 687,000 (H) 07/02/2019   Hepatitis B No results found for: HEPBSAB, HEPBSAG, HEPBCAB Hepatitis A No results found for: HAV HIV No results found for: HIV Lab Results  Component Value Date   CREATININE 0.81 03/14/2020   CREATININE 0.75 02/21/2020   CREATININE 1.10 11/03/2019   CREATININE 1.04 10/11/2019   CREATININE 1.19 10/08/2019   Lab Results  Component Value Date   AST 32 02/21/2020   AST 29 10/03/2019   AST 46 (H) 10/02/2019   ALT 28 02/21/2020   ALT 24 10/03/2019   ALT 35 10/02/2019   INR 1.2 10/03/2019   INR 1.1 10/02/2019   INR 1.1 05/25/2019    Assessment: [name] [name] presents today in good spirits for HCV management. Patient last seen by Dr. West Bali on 02/23/20 in which patient reported he just became aware of HCV diagnosis a few months ago and denied prior treatment. Denied history of IVDU, however PCP noted prior history of IVDU. History of multiple blood transfusions secondary to hematuria. Denied sharing of toothbrushes/razors, or sexual contact with known positive partners. Patient was not  started on HCV therapy as Dr. West Bali ordered Fibrotest/US elastography to be done before inititiating treatment. Current HCV RNA VL  1.3 million with genotype 1a as of 02/23/20.     Plan: ***  Lorel Monaco, PharmD PGY2 Ambulatory Care Resident [facility]

## 2020-03-22 ENCOUNTER — Ambulatory Visit: Payer: Medicare Other | Admitting: Pharmacist

## 2020-03-23 ENCOUNTER — Encounter (HOSPITAL_COMMUNITY): Payer: Self-pay | Admitting: Emergency Medicine

## 2020-03-23 ENCOUNTER — Other Ambulatory Visit: Payer: Self-pay

## 2020-03-23 ENCOUNTER — Emergency Department (HOSPITAL_COMMUNITY)
Admission: EM | Admit: 2020-03-23 | Discharge: 2020-03-23 | Disposition: A | Discharge status: Home / Self Care | Payer: Medicare Other | Attending: Emergency Medicine | Admitting: Emergency Medicine

## 2020-03-23 DIAGNOSIS — R52 Pain, unspecified: Secondary | ICD-10-CM | POA: Diagnosis not present

## 2020-03-23 DIAGNOSIS — Y628 Failure of sterile precautions during other surgical and medical care: Secondary | ICD-10-CM | POA: Insufficient documentation

## 2020-03-23 DIAGNOSIS — Z743 Need for continuous supervision: Secondary | ICD-10-CM | POA: Diagnosis not present

## 2020-03-23 DIAGNOSIS — I10 Essential (primary) hypertension: Secondary | ICD-10-CM | POA: Diagnosis not present

## 2020-03-23 DIAGNOSIS — T83198A Other mechanical complication of other urinary devices and implants, initial encounter: Secondary | ICD-10-CM | POA: Diagnosis not present

## 2020-03-23 DIAGNOSIS — Z87891 Personal history of nicotine dependence: Secondary | ICD-10-CM | POA: Diagnosis not present

## 2020-03-23 DIAGNOSIS — T83028A Displacement of other indwelling urethral catheter, initial encounter: Secondary | ICD-10-CM | POA: Diagnosis not present

## 2020-03-23 DIAGNOSIS — Z7982 Long term (current) use of aspirin: Secondary | ICD-10-CM | POA: Diagnosis not present

## 2020-03-23 DIAGNOSIS — T83010A Breakdown (mechanical) of cystostomy catheter, initial encounter: Secondary | ICD-10-CM

## 2020-03-23 DIAGNOSIS — Z79899 Other long term (current) drug therapy: Secondary | ICD-10-CM | POA: Insufficient documentation

## 2020-03-23 DIAGNOSIS — R6889 Other general symptoms and signs: Secondary | ICD-10-CM | POA: Diagnosis not present

## 2020-03-23 LAB — CBC
HCT: 36.7 % — ABNORMAL LOW (ref 39.0–52.0)
Hemoglobin: 10.9 g/dL — ABNORMAL LOW (ref 13.0–17.0)
MCH: 21.7 pg — ABNORMAL LOW (ref 26.0–34.0)
MCHC: 29.7 g/dL — ABNORMAL LOW (ref 30.0–36.0)
MCV: 73.1 fL — ABNORMAL LOW (ref 80.0–100.0)
Platelets: 351 10*3/uL (ref 150–400)
RBC: 5.02 MIL/uL (ref 4.22–5.81)
RDW: 19.8 % — ABNORMAL HIGH (ref 11.5–15.5)
WBC: 7.8 10*3/uL (ref 4.0–10.5)
nRBC: 0 % (ref 0.0–0.2)

## 2020-03-23 LAB — BASIC METABOLIC PANEL
Anion gap: 13 (ref 5–15)
BUN: 18 mg/dL (ref 8–23)
CO2: 20 mmol/L — ABNORMAL LOW (ref 22–32)
Calcium: 8.9 mg/dL (ref 8.9–10.3)
Chloride: 99 mmol/L (ref 98–111)
Creatinine, Ser: 1.26 mg/dL — ABNORMAL HIGH (ref 0.61–1.24)
GFR, Estimated: 56 mL/min — ABNORMAL LOW (ref >60)
Glucose, Bld: 75 mg/dL (ref 70–99)
Potassium: 4.6 mmol/L (ref 3.5–5.1)
Sodium: 132 mmol/L — ABNORMAL LOW (ref 135–145)

## 2020-03-23 MED ORDER — FENTANYL CITRATE (PF) 100 MCG/2ML IJ SOLN
50.0000 ug | Freq: Once | INTRAMUSCULAR | Status: DC
  Filled 2020-03-23: qty 2

## 2020-03-23 NOTE — ED Notes (Signed)
Bladder scan volume 351

## 2020-03-23 NOTE — ED Triage Notes (Signed)
Patient arrives to ED with complaints of issues with his suprapubic catheter being clogged up today. Pt states he fist noticed the blockage today at 10am. Pt states he is currently not draining any urine.

## 2020-03-23 NOTE — ED Provider Notes (Signed)
°  Face-to-face evaluation   History: He presents for evaluation of urine leaking around his suprapubic catheter site.  He comes from home.  He is unsure when ambulating started.  The ED a couple weeks ago and had a catheter change at that time.  It was replaced with a Foley catheter.  Physical exam: Alert, calm, cooperative.  He is mildly uncomfortable.  Abdomen somewhat distended. Urinalysis was normal Foley catheter in place.  Skin to the right of the ostomy site is slightly.  There is no purulent drainage.  Medical screening examination/treatment/procedure(s) were conducted as a shared visit with non-physician practitioner(s) and myself.  I personally evaluated the patient during the encounter    Daleen Bo, MD 03/23/20 2352

## 2020-03-23 NOTE — Discharge Instructions (Addendum)
At this time there does not appear to be the presence of an emergent medical condition, however there is always the potential for conditions to change. Please read and follow the below instructions.  Please return to the Emergency Department immediately for any new or worsening symptoms. Please be sure to follow up with your Primary Care Provider within one week regarding your visit today; please call their office to schedule an appointment even if you are feeling better for a follow-up visit. Please call the specialist at [facility] today to schedule a follow-up appointment for future catheter care.  You currently have a Foley catheter in place which is a not the preferable catheter for a suprapubic ostomy.  [facility] with [facility] for definitive care.  Go to the nearest Emergency Department immediately if: You have fever or chills Your catheter comes out. You have: Nausea. Back pain. Difficulty changing your catheter. Blood in your urine. No urine flow for 1 hour. You have any new/concerning or worsening of symptoms  Please read the additional information packets attached to your discharge summary.  Do not take your medicine if  develop an itchy rash, swelling in your mouth or lips, or difficulty breathing; call 911 and seek immediate emergency medical attention if this occurs.  You may review your lab tests and imaging results in their entirety on your MyChart account.  Please discuss all results of fully with your primary care provider and other specialist at your follow-up visit.  Note: Portions of this text may have been transcribed using voice recognition software. Every effort was made to ensure accuracy; however, inadvertent computerized transcription errors may still be present.

## 2020-03-23 NOTE — ED Provider Notes (Signed)
[facility] EMERGENCY DEPARTMENT Provider Note   CSN: [id number] Arrival date & time: 03/23/20  1425     History Chief Complaint  Patient presents with  . Clogged Catheter    Delvecchio Madole. is a 74 y.o. male history of suprapubic catheter, BPH, hypertension, blindness.  Patient presents today due to suprapubic catheter obstruction.  Patient is a 89 French Foley catheter in place he reports around an hour prior to arrival he noticed it stopped draining.  Since that time he has noticed some mild suprapubic pressure nonradiating no aggravating or alleviating factors.  He reports this happens occasionally, he was in the emergency department just over a week ago with a similar problem.  He reports that the time he was told he had a urinary tract infection, he was started on antibiotics which he has been taking as prescribed.  He denies any fever/chills, nausea/vomiting, abdominal pain, chest pain/shortness of breath, diarrhea, testicular pain/swelling or any additional concerns. HPI     Past Medical History:  Diagnosis Date  . Anemia 10/02/2019   DUE TO BLOOD LOSS  . Blind   . Disorder of bone and cartilage, unspecified   . Edema   . Elevated prostate specific antigen (PSA)   . Hypertension   . Hypopotassemia   . Nonspecific abnormal results of liver function study   . Unspecified glaucoma(365.9)     Patient Active Problem List   Diagnosis Date Noted  . Swelling of lower leg 10/02/2019  . 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 Thrombocytosis 09/13/2019  . Urinary retention 09/13/2019  . AKI (acute kidney injury) ([place]) 05/25/2019  . BPH (benign prostatic hyperplasia) 05/25/2019  . Anemia due to blood loss, chronic   . Acute blood loss anemia 01/14/2018  . Hypokalemia 01/14/2018  . 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 Hematuria 12/23/2012  . Chest pain 12/23/2012  . Anemia due to blood loss, acute 12/23/2012  . Elevated prostate specific antigen (PSA)    . Hypertension   . Edema   . Severe stage glaucoma 08/14/2011  . 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 CYSTOSCOPY WITH DIRECT VISION INTERNAL URETHROTOMY N/A 10/02/2019   Procedure: CYSTOSCOPY WITH DIRECT VISION INTERNAL URETHROTOMY WITH CLOT EVACUATION retrograd urethralgram Coverted to Open.;  Surgeon: Lucas Mallow, MD;  Location: [facility];  Service: Urology;  Laterality: N/A;  . CYSTOSCOPY WITH FULGERATION N/A 01/17/2018   Procedure: CYSTOSCOPY CLOT EVACUATION WITH FULGERATION TUR BIOPSY OF BLADDER NECK;  Surgeon: Irine Seal, MD;  Location: [facility];  Service: Urology;  Laterality: N/A;  . CYSTOSTOMY N/A 10/02/2019   Procedure: Open Cystostomy Suprapubic tube placement and JP drain and urethral dilation;  Surgeon: Lucas Mallow, MD;  Location: [facility];  Service: Urology;  Laterality: N/A;  . EYE SURGERY Bilateral 2009   for glaucoma  . EYE SURGERY Left 2014  . HERNIA REPAIR Right 2003   Dr. Jeraldine Loots  . IR ANGIOGRAM EXTREMITY RIGHT  10/03/2019  . IR ANGIOGRAM PELVIS SELECTIVE OR SUPRASELECTIVE  10/03/2019  . IR ANGIOGRAM PELVIS SELECTIVE OR SUPRASELECTIVE  10/03/2019  . IR ANGIOGRAM SELECTIVE EACH ADDITIONAL VESSEL  10/03/2019  . IR ANGIOGRAM SELECTIVE EACH ADDITIONAL VESSEL  10/03/2019  . IR ANGIOGRAM SELECTIVE EACH ADDITIONAL VESSEL  10/03/2019  . IR EMBO ART  VEN HEMORR LYMPH EXTRAV  INC GUIDE ROADMAPPING  10/03/2019  . IR US  GUIDE VASC ACCESS RIGHT  10/03/2019  . LAPAROSCOPIC APPENDECTOMY N/A 12/24/2012   Procedure:  LAPAROSCOPIC APPENDECTOMY ;  Surgeon: Pedro Earls, MD;  Location: [facility];  Service: General;  Laterality: N/A;  . TRANSURETHRAL RESECTION OF PROSTATE N/A 05/27/2019   Procedure: Cystoscopy Clot Evacuation and Fulgration;  Surgeon: Ardis Hughs, MD;  Location: [facility];  Service: Urology;  Laterality: N/A;        Family History  Problem Relation Age of Onset  . Kidney failure Mother     Social History   Tobacco Use  . Smoking status: Former Smoker    Types: Cigarettes    Quit date: 06/05/1968    Years since quitting: 51.8  . Smokeless tobacco: Never Used  Vaping Use  . Vaping Use: Never used  Substance Use Topics  . Alcohol use: No    Comment: STOPPED IN 1970S  . Drug use: No    Home Medications Prior to Admission medications   Medication Sig Start Date End Date Taking? Authorizing Provider  Ascorbic Acid (VITAMIN C WITH ROSE HIPS) 1000 MG tablet Take 2,000 mg by mouth daily with lunch.    Yes [provider]  aspirin-acetaminophen-caffeine (EXCEDRIN MIGRAINE) [phone] MG tablet Take 1 tablet by mouth daily as needed (pain).    Yes [provider]  B Complex Vitamins (VITAMIN-B COMPLEX PO) Take 1 tablet by mouth daily.   Yes [provider]  brimonidine (ALPHAGAN) 0.2 % ophthalmic solution Place 1 drop into both eyes 2 (two) times daily. 03/11/19  Yes [provider]  dorzolamide-timolol (COSOPT) 22.3-6.8 MG/ML ophthalmic solution APPOINTMENT OVERDUE, instill 1 drop in both eyes twice daily 07/14/13  Yes [name], NP  finasteride (PROSCAR) 5 MG tablet Take 5 mg by mouth daily. 03/10/20  Yes [provider]  folic acid (FOLVITE) 540 MCG tablet Take 800 mcg by mouth 2 (two) times a week. Twice a week   Yes [provider]  hydrochlorothiazide (MICROZIDE) 12.5 MG capsule TAKE 1 CAPSULE(12.5 MG) BY MOUTH DAILY Patient taking differently: Take 12.5 mg by mouth daily.  12/25/19  Yes [name], NP  Magnesium 250 MG TABS Take 250 mg by mouth daily as needed (migraine headache).    Yes [provider]  pilocarpine (PILOCAR) 2 % ophthalmic solution Place 1 drop into the right eye every 6 (six) hours.  12/25/17  Yes [provider]  potassium chloride SA (KLOR-CON) 20 MEQ tablet TAKE 1 TABLET(20 MEQ) BY  MOUTH DAILY Patient taking differently: Take 20 mEq by mouth daily.  12/25/19  Yes [name], NP  pyridOXINE (VITAMIN B-6) 50 MG tablet Take 50 mg by mouth daily with lunch.    Yes [provider]  sulfamethoxazole-trimethoprim (BACTRIM DS) 800-160 MG tablet Take 1 tablet by mouth 2 (two) times daily. 03/14/20  Yes Gladys Damme, MD  vitamin B-12 (CYANOCOBALAMIN) 1000 MCG tablet Take 1,000 mcg by mouth daily with lunch.   Yes [provider]    Allergies    Patient has no known allergies.  Review of Systems   Review of Systems Ten systems are reviewed and are negative for acute change except as noted in the HPI  Physical Exam Updated Vital Signs BP 130/71   Pulse (!) 54   Temp 97.7 F (36.5 C) (Oral)   Resp 14   SpO2 94%   Physical Exam Constitutional:      General: He is not in acute distress.    Appearance: Normal appearance.  He is well-developed. He is not ill-appearing or diaphoretic.  HENT:     Head: Normocephalic and atraumatic.  Eyes:     General: Vision grossly intact. Gaze aligned appropriately.     Pupils: Pupils are equal, round, and reactive to light.  Neck:     Trachea: Trachea and phonation normal.  Pulmonary:     Effort: Pulmonary effort is normal. No respiratory distress.  Abdominal:     General: There is no distension.     Palpations: Abdomen is soft.     Tenderness: There is no abdominal tenderness. There is no guarding or rebound.       Comments: Suprapubic stoma without purulence erythema streaking induration or bleeding.  Musculoskeletal:        General: Normal range of motion.     Cervical back: Normal range of motion.  Skin:    General: Skin is warm and dry.  Neurological:     Mental Status: He is alert.     GCS: GCS eye subscore is 4. GCS verbal subscore is 5. GCS motor subscore is 6.     Comments: Speech is clear and goal oriented, follows commands Major Cranial nerves without deficit, no facial droop Moves  extremities without ataxia, coordination intact  Psychiatric:        Behavior: Behavior normal.     ED Results / Procedures / Treatments   Labs (all labs ordered are listed, but only abnormal results are displayed) Labs Reviewed  BASIC METABOLIC PANEL - Abnormal; Notable for the following components:      Result Value   Sodium 132 (*)    CO2 20 (*)    Creatinine, Ser 1.26 (*)    GFR, Estimated 56 (*)    All other components within normal limits  CBC - Abnormal; Notable for the following components:   Hemoglobin 10.9 (*)    HCT 36.7 (*)    MCV 73.1 (*)    MCH 21.7 (*)    MCHC 29.7 (*)    RDW 19.8 (*)    All other components within normal limits    EKG None  Radiology No results found.  Procedures Procedures (including critical care time)  Suprapubic Tube Replacement Date/Time: 03/23/2020 8:20 PM Performed by: Deliah Boston, PA-C Authorized by: Deliah Boston, PA-C   Consent:    Consent obtained:  Verbal   Consent given by:  Patient   Risks discussed:  Bleeding, intraabdominal injury, infection and pain Anesthesia (see MAR for exact dosages):    Anesthesia method:  None Procedure details:    Catheter type:  Foley   Catheter size:  22 Fr   Ultrasound guidance: no     Number of attempts:  1   Urine characteristics:  Clear and yellow Post-procedure details:    Patient tolerance of procedure:  Tolerated well, no immediate complications Comments:     Tube was gently inserted without pain. This was a Tube Exchange, not the initial tube placement.  Medications Ordered in ED Medications - No data to display  ED Course  I have reviewed the triage vital signs and the nursing notes.  Pertinent labs & imaging results that were available during my care of the patient were reviewed by me and considered in my medical decision making (see chart for details).  Clinical Course as of Mar 23 2022  Tue Mar 23, 2020  1513 Patient not in room   [BM]    Clinical  Course User Index [BM] Deliah Boston,  PA-C   MDM Rules/Calculators/A&P                          Additional history obtained from: 1. Nursing notes from this visit. 2. Review of electronic medical record.  Shows patient had 17 French Foley placed suprapubically on March 14, 2020.  They encourage patient to follow-up with urology and started him on Bactrim. ------------------ Basic labs were ordered in triage, I reviewed and interpreted them.  CBC without leukocytosis to suggest infection, anemia of 10.9 appears baseline.  BMP shows no emergent electrolyte derangement or gap, minimal elevation of creatinine at 1.26 nonemergent. - Attempted to obtain a suprapubic catheter however after reaching out to multiple departments there are none at [facility].  Then attempted to irrigate patient's current Foley catheter without success.  Ultimately the catheter was replaced with another 22 [place] Foley.  After replacement with sterile technique there was immediate drainage of urine approximately 300 mL.  Patient encouraged to call [facility] to schedule a follow-up appointment for further care, no indication for antibiotics at this time.  At this time there does not appear to be any evidence of an acute emergency medical condition and the patient appears stable for discharge with appropriate outpatient follow up. Diagnosis was discussed with patient who verbalizes understanding of care plan and is agreeable to discharge. I have discussed return precautions with patient who verbalizes understanding. Patient encouraged to follow-up with their PCP. All questions answered.  Patient seen and evaluated by Dr. Eulis Foster during this visit who agrees with discharge at this time.  Note: Portions of this report may have been transcribed using voice recognition software. Every effort was made to ensure accuracy; however, inadvertent computerized transcription errors may still be present. Final Clinical  Impression(s) / ED Diagnoses Final diagnoses:  Suprapubic catheter dysfunction, initial encounter [facility])    Rx / DC Orders ED Discharge Orders    None       Gari Crown 03/23/20 2024    Daleen Bo, MD 03/23/20 2352

## 2020-03-24 ENCOUNTER — Ambulatory Visit: Payer: Medicare Other | Admitting: Urology

## 2020-03-31 ENCOUNTER — Ambulatory Visit (HOSPITAL_COMMUNITY): Payer: Medicare Other

## 2020-04-01 ENCOUNTER — Other Ambulatory Visit: Payer: Self-pay

## 2020-04-01 ENCOUNTER — Other Ambulatory Visit: Payer: Self-pay | Admitting: *Deleted

## 2020-04-01 NOTE — Patient Outreach (Signed)
[facility]) Care Management  04/01/2020  Zacchary Pompei. 05/15/1946 [id number]   [facility] Unsuccessful outreach forpost hospital complex care Referral Date:10/13/19 Referral Source:[facility] hospital liaison, Natividad Brood Referral Reason:High ED utilizer in the united healthcare medicare [facility]: Please assign to [place] care coordinator for complex care, PCP does TOC and disease management follow calls and support needs assess for further needs Insurance:united healthcare medicare   Outreach attempt to the home number  No answer. [facility] RN CM left HIPAA [facility] Portability and Accountability Act) compliant voicemail message along with CM's contact info.   Shortly after the message was left Mr Heckmann returned a call to [place] Mr Viernes was encouraged to identify himself as he informed [facility] RN CM "should be able to recognize my voice"  Patient is able to verify HIPAA ([place] and Accountability Act) identifiers Reviewed and addressed the purpose of the follow up call with the patient - follow up on home health services, urology services and assess for any other needs  Consent: [facility] RN CM reviewed [facility] services with patient. Patient gave verbal consent for services.   Follow up assessment Mr Aloia informs [facility] RN CM he is not doing well  With inquiry,  Mr Seeling informs [facility] RN CM he is not doing well as he needs a SNAP card for food He reports he "fell behind in paying" his electric bill but has made an agreement with [employer] He states "I receive thirteen twelve a month" in which he reports using for other bills  He confirms he has not attempted to call or go to the Department of Social Services (DSS) to get assistance with a SNAP card He states he needs someone that has "Inside with connections"  He stated [facility] RN CM informed him [facility] RN CM had "connections" and a SW.  [facility] RN CM confirmed with him  that [facility] RN CM has never said this  Mr Pehl was informed that [facility] has SWs but they do not work for [employer] Mr Carsey spent a length of time verbalizing his feelings about [facility] SW being "private",and informing [facility] RN CM "you can't help me" after [facility] RN CM offered to complete a [facility] SW referral to see if [facility] SW could offer any possible assistance related to his financial concerns and SNAP card/food. [facility] RN CM discussed that [facility] RN CM is not a SW and SW may be able to provide more appropriate resources.  [facility] RN CM attempted to answer various questions. Mr Aiken voiced not being pleased with how questions were answered by [facility] RN CM. [facility] RN CM attempted active listening as he ventilated and redirection but without success with continuation of the assessment. [facility] RN CM attempted to get Mr Suto to agree to continue assessment in a more appropriate manner and tone. [facility] RN CM attempted to inquire if case closure was preferred as he continue to state he was not being helped.   Home health services  [facility] RN CM inquired about home health services No services started  Mr Hiltner said he was not sure about [facility] but confirms he no longer felt he needed home health as he "found out why my catheter was being clogged"  "I don't need them now"  Mr Mcdonnell began to verbalize false statements about previous delivery of food provided by [facility] staff and delivered by [facility] RN CM staff in August 2021 with a list of local pantries. [facility]  RN CM did not enter Mr Russman apartment on the day of food delivery as he was not fully dressed and [facility] RN CM had further patients to outreach to. Mr Beske began to verbalize false statements about [facility] RN CM asking him for personal identification information. [facility] RN CM informed him his statements were not true and inquired if he was okay as he began to argue and increase is tone of speech.  Mr Rewerts continued to verbalize aggressively with some profanity Mr Enderle  disconnected the call when [facility] RN CM voiced statements of unbelief in his statements and manner of speaking to [facility] RN CM. [facility] RN CM had began the process of outreach to [facility] leadership to allow another [facility] staff to hear what was being stated when the call was dropped.   Plan: [facility] RN CM reviewed with [facility] leadership, G Pulliam  this patient's behavior, aggressive language during this outreach  Case closure letters sent to patient and PCP- consumer is not able to progress through care plan  Goals Addressed              This Visit's Progress     Patient Stated   .  COMPLETED: [facility]) Find Help in My Community (pt-stated)   Not on track     Follow Up Date   - follow-up on any referrals for help I am given    Why is this important?   Knowing how and where to find help for yourself or family in your neighborhood and community is an important skill.  You will want to take some steps to learn how.    Notes: 04/01/20 reviewed [facility] SW services (for [employer], finances, social needs) pcp office referral to home health services after ED visit Pt prefers [place]  03/17/20 Pt referred to various home health agencies via hospital ED RN CM pending any possible responses from agencies Pt does not want to establish new urology services until seen by home health agency Collaboration with ED RN CM - Poor response to home health services        [place]. Lavina Hamman, RN, BSN, [place] Coordinator Office number [phone] Mobile number [phone]  Main [facility] number [phone] Fax number [phone]

## 2020-04-07 ENCOUNTER — Telehealth: Payer: Self-pay

## 2020-04-07 NOTE — Telephone Encounter (Signed)
-----   Message from Rosiland Oz, MD sent at 04/07/2020  7:48 AM EDT ----- This patient has refused labs when seen in the last clinic appointment with me to being Hep C treatment. Could you please check when he is willing to come for labs? I can order the missing labs ( Hep A ab total, Hep B Core Ab IgG, Hep B Surface antibody, PT/INR, Fibro test), if he wants to come or he can get it in his next clinic appointment scheduled on 04/23/20 with [name], NP.

## 2020-04-07 NOTE — Telephone Encounter (Signed)
I called patient and he states that he doesn't want to go back to the [facility] because they wanted 6 vials of blood. Patient states that he's anemic and doesn't like to give much blood. Patient also states that [name], NP doesn't know what she's talking when it came to diagnosing him with Hepatitis C. Patient states he's never done anything to have such a thing. Patient states he cannot have Hepatitis C because he doesn't drink or smoke. Patient states that [facility] wouldn't discuss labs with him and inform him of what's going on with his blood work. Patient states that [facility] Control already took 1 vial of blood and from that vial of blood labs were never discussed. Patient states that he has no symptoms of Hepatitis C so he cannot have it. Patient states that [facility] sent him to Radiologist and the Radiologist couldn't tell him what was going on with his blood work. Patient states that he's not sure if he will come to his next scheduled office visit 04/23/2020 at 11am. Message routed to Marlowe Sax, NP.

## 2020-04-07 NOTE — Progress Notes (Signed)
This patient has refused labs when seen in the last clinic appointment with me to being Hep C treatment. Could you please check when he is willing to come for labs? I can order the missing labs ( Hep A ab total, Hep B Core Ab IgG, Hep B Surface antibody, PT/INR, Fibro test), if he wants to come or he can get it in his next clinic appointment scheduled on 04/23/20 with [name], NP.

## 2020-04-07 NOTE — Telephone Encounter (Signed)
Message received from [place] office has been trying to reach patient to get lab work done.please call and notify patient or does she want labs to be drawn during her visit with PCP Dani Gobble.Will need order's from [place] office if patient wants labs drawn by PCP.  ----- Message from Rosiland Oz, MD sent at 04/07/2020  7:48 AM EDT ----- This patient has refused labs when seen in the last clinic appointment with me to being Hep C treatment. Could you please check when he is willing to come for labs? I can order the missing labs ( Hep A ab total, Hep B Core Ab IgG, Hep B Surface antibody, PT/INR, Fibro test), if he wants to come or he can get it in his next clinic appointment scheduled on 04/23/20 with [name], NP.

## 2020-04-07 NOTE — Telephone Encounter (Signed)
Attempted to reach patient via phone, "this call can not be completed at this time" message received. No alternate phone number listed on profile. Will route message to PCP to see if that office will be able to draw labs if patient agrees at the time.  [name] [name]

## 2020-04-07 NOTE — Telephone Encounter (Addendum)
Noted.continue to follow up with ID.Notify [facility] if not able to come for upcoming visit with PCP on 04/13/2020.

## 2020-04-08 ENCOUNTER — Other Ambulatory Visit: Payer: Self-pay | Admitting: *Deleted

## 2020-04-08 NOTE — Patient Outreach (Signed)
[facility]) Care Management  04/08/2020  Glenden Rossell. 11-08-1945 [id number]   [place] coordination-  [facility] multidisciplinary care discussion (MCD)   [facility] RN CM reviewed patient with the [facility] MCD team [facility] RN CM answered questions for MCD team Discussed patient case closure- consumer is not able to progress through care plan [place] healthcare staff to see if patient is being followed by united healthcare programs    [place]. Lavina Hamman, RN, BSN, [place] Coordinator Office number [phone] Mobile number [phone]  Main [facility] number [phone] Fax number [phone]

## 2020-04-23 ENCOUNTER — Ambulatory Visit: Payer: Medicare Other | Admitting: Nurse Practitioner

## 2020-04-24 ENCOUNTER — Encounter (HOSPITAL_COMMUNITY): Payer: Self-pay | Admitting: *Deleted

## 2020-04-24 ENCOUNTER — Emergency Department (HOSPITAL_COMMUNITY)
Admission: EM | Admit: 2020-04-24 | Discharge: 2020-04-24 | Disposition: A | Discharge status: Home / Self Care | Payer: Medicare Other | Attending: Emergency Medicine | Admitting: Emergency Medicine

## 2020-04-24 ENCOUNTER — Other Ambulatory Visit: Payer: Self-pay

## 2020-04-24 DIAGNOSIS — Z7982 Long term (current) use of aspirin: Secondary | ICD-10-CM | POA: Insufficient documentation

## 2020-04-24 DIAGNOSIS — Z9359 Other cystostomy status: Secondary | ICD-10-CM

## 2020-04-24 DIAGNOSIS — Z79899 Other long term (current) drug therapy: Secondary | ICD-10-CM | POA: Diagnosis not present

## 2020-04-24 DIAGNOSIS — T83091A Other mechanical complication of indwelling urethral catheter, initial encounter: Secondary | ICD-10-CM | POA: Diagnosis not present

## 2020-04-24 DIAGNOSIS — R609 Edema, unspecified: Secondary | ICD-10-CM | POA: Diagnosis not present

## 2020-04-24 DIAGNOSIS — R2243 Localized swelling, mass and lump, lower limb, bilateral: Secondary | ICD-10-CM | POA: Diagnosis not present

## 2020-04-24 DIAGNOSIS — Z87891 Personal history of nicotine dependence: Secondary | ICD-10-CM | POA: Diagnosis not present

## 2020-04-24 DIAGNOSIS — I10 Essential (primary) hypertension: Secondary | ICD-10-CM | POA: Diagnosis not present

## 2020-04-24 DIAGNOSIS — Z743 Need for continuous supervision: Secondary | ICD-10-CM | POA: Diagnosis not present

## 2020-04-24 NOTE — ED Notes (Addendum)
Pt is refusing blood work because he says he is very anemic and does not have enough to give. Pt was educated that we do not take more than we need and that it is a very small amount. Pt is adamant that drawing any sort of blood work would be harmful.

## 2020-04-24 NOTE — Discharge Instructions (Addendum)
You came to ER for leg swelling and issue with suprapubic catheter  You refused blood work  Your catheter flushed without issue, there was 13 cc of urine in your bladder  Follow up with urology or primary care doctor as needed  Return for fever greater than 100, chest pain, shortness of breath, abdominal pain or flank pain

## 2020-04-24 NOTE — ED Notes (Signed)
Discharge instructions discussed with pt. Pt verbalized understanding. Pt stable and ambulatory. No signature pad available. 

## 2020-04-24 NOTE — ED Provider Notes (Signed)
[facility] EMERGENCY DEPARTMENT Provider Note   CSN: [id number] Arrival date & time: 04/24/20  1654     History Chief Complaint  Patient presents with  . foley cath clogged    [name] [name]. is a 74 y.o. male with history of chronic suprapubic catheter, hypertension, chronic leg edema presents to ER for evaluation of "urinary retention" for the last 2-3 days. States his urine output is decreased. Typically changes his foley bag 3-4 times a day and today he has only put out 300 mL.  Denies fever, chills, nausea, vomiting, abdominal pain or distention, flank pain. Has some chronic yellow drainage from skin at suprapubic cath unchanged.  Also reports worsening bilateral leg swelling. Reports chronic leg swelling but over last 2-3 days they have doubled in size. He thinks his suprapubic catheter and leg swelling are related.  Cannot remember last time he had this catheter exchanged. Does not have a urologist.  Denies chest pain, shortness of breath, cough, orthopnea, PND. Takes HCTZ, compliant.   HPI     Past Medical History:  Diagnosis Date  . Anemia 10/02/2019   DUE TO BLOOD LOSS  . Blind   . Disorder of bone and cartilage, unspecified   . Edema   . Elevated prostate specific antigen (PSA)   . Hypertension   . Hypopotassemia   . Nonspecific abnormal results of liver function study   . Unspecified glaucoma(365.9)     Patient Active Problem List   Diagnosis Date Noted  . Swelling of lower leg 10/02/2019  . 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 Thrombocytosis 09/13/2019  . Urinary retention 09/13/2019  . AKI (acute kidney injury) ([place]) 05/25/2019  . BPH (benign prostatic hyperplasia) 05/25/2019  . Anemia due to blood loss, chronic   . Acute blood loss anemia 01/14/2018  . Hypokalemia 01/14/2018  . 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 Hematuria 12/23/2012  . Chest pain 12/23/2012  . Anemia due to blood loss, acute 12/23/2012  . Elevated prostate  specific antigen (PSA)   . Hypertension   . Edema   . Severe stage glaucoma 08/14/2011  . 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 CYSTOSCOPY WITH DIRECT VISION INTERNAL URETHROTOMY N/A 10/02/2019   Procedure: CYSTOSCOPY WITH DIRECT VISION INTERNAL URETHROTOMY WITH CLOT EVACUATION retrograd urethralgram Coverted to Open.;  Surgeon: Lucas Mallow, MD;  Location: [facility];  Service: Urology;  Laterality: N/A;  . CYSTOSCOPY WITH FULGERATION N/A 01/17/2018   Procedure: CYSTOSCOPY CLOT EVACUATION WITH FULGERATION TUR BIOPSY OF BLADDER NECK;  Surgeon: Irine Seal, MD;  Location: [facility];  Service: Urology;  Laterality: N/A;  . CYSTOSTOMY N/A 10/02/2019   Procedure: Open Cystostomy Suprapubic tube placement and JP drain and urethral dilation;  Surgeon: Lucas Mallow, MD;  Location: [facility];  Service: Urology;  Laterality: N/A;  . EYE SURGERY Bilateral 2009   for glaucoma  . EYE SURGERY Left 2014  . HERNIA REPAIR Right 2003   Dr. Jeraldine Loots  . IR ANGIOGRAM EXTREMITY RIGHT  10/03/2019  . IR ANGIOGRAM PELVIS SELECTIVE OR SUPRASELECTIVE  10/03/2019  . IR ANGIOGRAM PELVIS SELECTIVE OR SUPRASELECTIVE  10/03/2019  . IR ANGIOGRAM SELECTIVE EACH ADDITIONAL VESSEL  10/03/2019  . IR ANGIOGRAM SELECTIVE EACH ADDITIONAL VESSEL  10/03/2019  . IR ANGIOGRAM SELECTIVE EACH ADDITIONAL VESSEL  10/03/2019  . IR EMBO ART  VEN HEMORR LYMPH EXTRAV  INC GUIDE ROADMAPPING  10/03/2019  . IR US GUIDE VASC ACCESS RIGHT  10/03/2019  . LAPAROSCOPIC APPENDECTOMY N/A 12/24/2012   Procedure:  LAPAROSCOPIC APPENDECTOMY ;  Surgeon: Pedro Earls, MD;  Location: [facility];  Service: General;  Laterality: N/A;  . TRANSURETHRAL RESECTION OF PROSTATE N/A 05/27/2019   Procedure: Cystoscopy Clot Evacuation and Fulgration;  Surgeon: Ardis Hughs, MD;  Location: [facility];  Service: Urology;   Laterality: N/A;       Family History  Problem Relation Age of Onset  . Kidney failure Mother     Social History   Tobacco Use  . Smoking status: Former Smoker    Types: Cigarettes    Quit date: 06/05/1968    Years since quitting: 51.9  . Smokeless tobacco: Never Used  Vaping Use  . Vaping Use: Never used  Substance Use Topics  . Alcohol use: No    Comment: STOPPED IN 1970S  . Drug use: No    Home Medications Prior to Admission medications   Medication Sig Start Date End Date Taking? Authorizing Provider  Ascorbic Acid (VITAMIN C WITH ROSE HIPS) 1000 MG tablet Take 2,000 mg by mouth daily with lunch.     [provider]  aspirin-acetaminophen-caffeine (EXCEDRIN MIGRAINE) [phone] MG tablet Take 1 tablet by mouth daily as needed (pain).     [provider]  B Complex Vitamins (VITAMIN-B COMPLEX PO) Take 1 tablet by mouth daily.    [provider]  brimonidine (ALPHAGAN) 0.2 % ophthalmic solution Place 1 drop into both eyes 2 (two) times daily. 03/11/19   [provider]  dorzolamide-timolol (COSOPT) 22.3-6.8 MG/ML ophthalmic solution APPOINTMENT OVERDUE, instill 1 drop in both eyes twice daily 07/14/13   [name], NP  finasteride (PROSCAR) 5 MG tablet Take 5 mg by mouth daily. 03/10/20   [provider]  folic acid (FOLVITE) 902 MCG tablet Take 800 mcg by mouth 2 (two) times a week. Twice a week    [provider]  hydrochlorothiazide (MICROZIDE) 12.5 MG capsule TAKE 1 CAPSULE(12.5 MG) BY MOUTH DAILY Patient taking differently: Take 12.5 mg by mouth daily.  12/25/19   [name], NP  Magnesium 250 MG TABS Take 250 mg by mouth daily as needed (migraine headache).     [provider]  pilocarpine (PILOCAR) 2 % ophthalmic solution Place 1 drop into the right eye every 6 (six) hours.  12/25/17   [provider]  potassium chloride SA (KLOR-CON) 20 MEQ tablet TAKE 1 TABLET(20 MEQ) BY MOUTH  DAILY Patient taking differently: Take 20 mEq by mouth daily.  12/25/19   [name], NP  pyridOXINE (VITAMIN B-6) 50 MG tablet Take 50 mg by mouth daily with lunch.     [provider]  sulfamethoxazole-trimethoprim (BACTRIM DS) 800-160 MG tablet Take 1 tablet by mouth 2 (two) times daily. 03/14/20   Gladys Damme, MD  vitamin B-12 (CYANOCOBALAMIN) 1000 MCG tablet Take 1,000 mcg by mouth daily with lunch.    [provider]    Allergies    Patient has no known allergies.  Review of Systems   Review of Systems  Cardiovascular: Positive for leg swelling.  Genitourinary: Positive for difficulty urinating.  All other systems reviewed and are negative.   Physical Exam Updated Vital Signs BP (!) 147/81   Pulse (!) 55   Temp (!) 97.5 F (36.4 C) (Oral)   Resp 17   Wt 86.2 kg   SpO2 100%  BMI 28.89 kg/m   Physical Exam Vitals and nursing note reviewed.  Constitutional:      General: He is not in acute distress.    Appearance: He is well-developed.     Comments: NAD.  HENT:     Head: Normocephalic and atraumatic.     Right Ear: External ear normal.     Left Ear: External ear normal.     Nose: Nose normal.  Eyes:     General: No scleral icterus.    Conjunctiva/sclera: Conjunctivae normal.  Cardiovascular:     Rate and Rhythm: Normal rate and regular rhythm.     Heart sounds: Normal heart sounds.  Pulmonary:     Effort: Pulmonary effort is normal.     Breath sounds: Normal breath sounds.  Abdominal:     Palpations: Abdomen is soft.     Tenderness: There is no abdominal tenderness.     Comments: Suprapubic catheter in place, minimal runny white discharge from skin but no surrounding erythema, fluctuance, tenderness or discharge. No suprapubic or CVA tenderness. No distention.   Genitourinary:    Comments: Normal external genitalia. Foley bag with 300 cc yellow clear urine noted  Musculoskeletal:        General: No deformity. Normal range of  motion.     Cervical back: Normal range of motion and neck supple.  Skin:    General: Skin is warm and dry.     Capillary Refill: Capillary refill takes less than 2 seconds.  Neurological:     Mental Status: He is alert and oriented to person, place, and time.  Psychiatric:        Behavior: Behavior normal.        Thought Content: Thought content normal.        Judgment: Judgment normal.     ED Results / Procedures / Treatments   Labs (all labs ordered are listed, but only abnormal results are displayed) Labs Reviewed  COMPREHENSIVE METABOLIC PANEL  CBC  URINALYSIS, ROUTINE W REFLEX MICROSCOPIC    EKG None  Radiology No results found.  Procedures Procedures (including critical care time)  Medications Ordered in ED Medications - No data to display  ED Course  I have reviewed the triage vital signs and the nursing notes.  Pertinent labs & imaging results that were available during my care of the patient were reviewed by me and considered in my medical decision making (see chart for details).    MDM Rules/Calculators/A&P                          74 year old male with BPH, chronic suprapubic catheter, chronic leg edema on HCTZ for hypertension here for concern over decreased urine output for the last 2 to 3 days and increased leg swelling. No fever, nausea, vomiting.  No abdominal pain, flank pain.  No CP, SOB. Claims compliance with HCTZ.   RN flushed the Foley without difficulty.  Patient has put out more than 300 cc of urine here in the ED.  Bladder scan was done and had only 13 cc of urine in his bladder.  At this time his Foley catheter appears to be functioning well.  He has no symptoms or findings to suggest significant or acute urinary retention or Foley malfunction.  Patient became very upset when I told him that his Foley appeared to be working well.  States the nurse did not BladderScan him appropriately.  I offered a repeat bladder scan to  make sure but he  declined.    Given age, chronic HCTZ use, decreased urine output and peripheral edema I recommended lab work to check creatinine at least. Patient is refusing blood work and stating that he is anemic and does not have enough blood to give.  RN, EMT and myself attempted to explain to him that it is safe to provide a small blood sample to continue work-up in the ED patient adamantly refusing.  At this time there is not much I can offer the patient without any lab work.  I do not think his Foley needs to be exchanged.  He denies UTI symptoms.  No fever, clinical findings to suggest UTI.  He does have peripheral swelling but lungs are clear without crackles, no hypoxia, no respiratory distress.  I don't think he needs emergent CXR.  Without any lab work unable to proceed with any interventions here.  Would like to check his creatinine but he refuses labs.  Patient states that the "plastic" of his medicines that he takes specifically "Keflex" and "hydrochlorothiazide" are causing his Foley to malfunction.  He requested discharge and refuse further medical care here.  Final Clinical Impression(s) / ED Diagnoses Final diagnoses:  Localized swelling of both lower legs  Suprapubic catheter [facility])    Rx / DC Orders ED Discharge Orders    None       Kinnie Feil, PA-C 04/24/20 2254    Drenda Freeze, MD 04/24/20 2259

## 2020-04-24 NOTE — ED Notes (Signed)
Attempted to obtain discharge vital signs. Pt began yelling stating "You already got vital signs, you don't need them anymore! I'm being discharged. My bag isn't even flowing!" This writer informed the pt that his bladder scan showed he only had 61mL in his bladder. Pt started yelling again that the bladder scan was not done properly and that the nurse kept the probe in the same spot. This Probation officer informed the pt that with our scanner, that is the proper way to scan a bladder. Pt refused to accept this information.

## 2020-04-24 NOTE — ED Triage Notes (Signed)
The pt is c/o a clogged foley catheter for 2-3 days  He has a chronic foley since 2014 he also has swollen both lower legs for 3 days also

## 2020-04-26 ENCOUNTER — Telehealth: Payer: Self-pay | Admitting: *Deleted

## 2020-04-26 NOTE — Telephone Encounter (Signed)
-----   Message from Rosiland Oz, MD sent at 04/26/2020  9:38 AM EST ----- Can we schedule a fu with [facility] for labs then if the patient is willing for hep C treatment ?

## 2020-04-26 NOTE — Telephone Encounter (Signed)
Left message on patient's voicemail. Identified myself as a nurse with Dr West Bali, following up after his visit with [place] in September.  RN asked patient to please call back and let us know if he wanted to follow up with Dr West Bali or with his primary care doctor.  He was supposed to see his primary care provider 11/19 to follow up on labs needed to plan his hepatitis c treatment. Per chart, patient did not plan to come back to [facility]. Landis Gandy, RN

## 2020-04-26 NOTE — Telephone Encounter (Signed)
Ok, Thanks

## 2020-05-20 ENCOUNTER — Telehealth: Payer: Self-pay

## 2020-05-20 NOTE — Telephone Encounter (Signed)
Patient stated he was trying to get [employer] transportation and they needed some information from [place]. He gave me a name and phone number to call.  [name] [name] [phone].  I called the number and got an answering machine. I left a message for her to return call so that I could find out exactly what was needed.

## 2020-05-24 NOTE — Telephone Encounter (Signed)
Called [place] again and got her VM. Left message for her to return call.

## 2020-05-27 NOTE — Telephone Encounter (Signed)
[name] [name] returned my call. She needs part B form completed. Asked that she fax [place] a copy, fax # given.

## 2020-06-07 NOTE — Telephone Encounter (Signed)
Received DMV Paperwork from [employer] with [employer]. [phone]  Placed in [name]'s folder to review and fill out. To be faxed back to Fax: [phone]

## 2020-06-10 NOTE — Telephone Encounter (Signed)
DMV Paperwork faxed and sent copy to [place].

## 2020-06-14 DIAGNOSIS — T83198A Other mechanical complication of other urinary devices and implants, initial encounter: Secondary | ICD-10-CM | POA: Diagnosis not present

## 2020-06-14 DIAGNOSIS — Y846 Urinary catheterization as the cause of abnormal reaction of the patient, or of later complication, without mention of misadventure at the time of the procedure: Secondary | ICD-10-CM | POA: Diagnosis not present

## 2020-06-25 DIAGNOSIS — R6889 Other general symptoms and signs: Secondary | ICD-10-CM | POA: Diagnosis not present

## 2020-07-05 ENCOUNTER — Other Ambulatory Visit: Payer: Self-pay

## 2020-07-05 ENCOUNTER — Encounter: Payer: Medicare Other | Admitting: Nurse Practitioner

## 2020-07-05 ENCOUNTER — Telehealth: Payer: Self-pay

## 2020-07-05 NOTE — Telephone Encounter (Signed)
3 unsessessful attempts made to reach patient by phone to complete Annual Wellness Visit scheduled today with [name], NP at 1: 00 pm.  Patient was instructed in voicemail to call and reschedule appointment.   Message also sent to administrative staff to follow through on no show protocol.

## 2020-07-06 ENCOUNTER — Telehealth: Payer: Self-pay

## 2020-07-06 ENCOUNTER — Ambulatory Visit (INDEPENDENT_AMBULATORY_CARE_PROVIDER_SITE_OTHER): Payer: Medicare Other | Admitting: Nurse Practitioner

## 2020-07-06 ENCOUNTER — Encounter: Payer: Self-pay | Admitting: Nurse Practitioner

## 2020-07-06 ENCOUNTER — Other Ambulatory Visit: Payer: Self-pay

## 2020-07-06 DIAGNOSIS — Z0000 Encounter for general adult medical examination without abnormal findings: Secondary | ICD-10-CM

## 2020-07-06 NOTE — Telephone Encounter (Signed)
Mr. earley, grobe are scheduled for a virtual visit with your provider today.    Just as we do with appointments in the office, we must obtain your consent to participate.  Your consent will be active for this visit and any virtual visit you may have with one of our providers in the next 365 days.    If you have a MyChart account, I can also send a copy of this consent to you electronically.  All virtual visits are billed to your insurance company just like a traditional visit in the office.  As this is a virtual visit, video technology does not allow for your provider to perform a traditional examination.  This may limit your provider's ability to fully assess your condition.  If your provider identifies any concerns that need to be evaluated in person or the need to arrange testing such as labs, EKG, etc, we will make arrangements to do so.    Although advances in technology are sophisticated, we cannot ensure that it will always work on either your end or our end.  If the connection with a video visit is poor, we may have to switch to a telephone visit.  With either a video or telephone visit, we are not always able to ensure that we have a secure connection.   I need to obtain your verbal consent now.   Are you willing to proceed with your visit today?   Even Sumner Boast. has provided verbal consent on 07/06/2020 for a virtual visit (video or telephone).   Carroll Kinds, [facility] 07/06/2020  10:57 AM

## 2020-07-06 NOTE — Progress Notes (Signed)
This service is provided via telemedicine  No vital signs collected/recorded due to the encounter was a telemedicine visit.   Location of patient (ex: home, work): Home  Patient consents to a telephone visit:  Yes, see encounter dated 07/06/2020  Location of the provider (ex: office, home):  [place]  Name of any referring provider: N/A  Names of all persons participating in the telemedicine service and their role in the encounter: [name], Nurse Practitioner, Carroll Kinds, CMA, and patient.   Time spent on call: 15 minutes with medical assistant

## 2020-07-06 NOTE — Progress Notes (Signed)
Subjective:   [name] [name]. is a 75 y.o. male who presents for Medicare Annual/Subsequent preventive examination.  Review of Systems     Cardiac Risk Factors include: sedentary lifestyle;advanced age (>71men, >65 women);hypertension;dyslipidemia;male gender     Objective:    There were no vitals filed for this visit. There is no height or weight on file to calculate BMI.  Advanced Directives 07/06/2020 04/24/2020 02/27/2020 02/23/2020 01/27/2020 12/22/2019 11/02/2019  Does Patient Have a Medical Advance Directive? No No No No No No No  Does patient want to make changes to medical advance directive? - - - - - - -  Would patient like information on creating a medical advance directive? - - No - Patient declined No - Patient declined No - Patient declined No - Patient declined No - Patient declined  Pre-existing out of facility DNR order (yellow form or pink MOST form) - - - - - - -    Current Medications (verified) Outpatient Encounter Medications as of 07/06/2020  Medication Sig  . Ascorbic Acid (VITAMIN C WITH ROSE HIPS) 1000 MG tablet Take 2,000 mg by mouth daily with lunch.   Marland Kitchen aspirin-acetaminophen-caffeine (EXCEDRIN MIGRAINE) 250-250-65 MG tablet Take 1 tablet by mouth daily as needed (pain).   . B Complex Vitamins (VITAMIN-B COMPLEX PO) Take 1 tablet by mouth daily.  . brimonidine (ALPHAGAN) 0.2 % ophthalmic solution Place 1 drop into both eyes 2 (two) times daily.  . dorzolamide-timolol (COSOPT) 22.3-6.8 MG/ML ophthalmic solution APPOINTMENT OVERDUE, instill 1 drop in both eyes twice daily  . finasteride (PROSCAR) 5 MG tablet Take 5 mg by mouth daily.  . folic acid (FOLVITE) 657 MCG tablet Take 800 mcg by mouth 2 (two) times a week. Twice a week  . hydrochlorothiazide (MICROZIDE) 12.5 MG capsule TAKE 1 CAPSULE(12.5 MG) BY MOUTH DAILY (Patient taking differently: Take 12.5 mg by mouth daily.)  . Magnesium 250 MG TABS Take 250 mg by mouth daily as needed (migraine headache).   .  pilocarpine (PILOCAR) 2 % ophthalmic solution Place 1 drop into the right eye every 6 (six) hours.   . potassium chloride SA (KLOR-CON) 20 MEQ tablet TAKE 1 TABLET(20 MEQ) BY MOUTH DAILY (Patient taking differently: Take 20 mEq by mouth daily.)  . pyridOXINE (VITAMIN B-6) 50 MG tablet Take 50 mg by mouth daily with lunch.   . vitamin B-12 (CYANOCOBALAMIN) 1000 MCG tablet Take 1,000 mcg by mouth daily with lunch.  . [DISCONTINUED] sulfamethoxazole-trimethoprim (BACTRIM DS) 800-160 MG tablet Take 1 tablet by mouth 2 (two) times daily.   No facility-administered encounter medications on file as of 07/06/2020.    Allergies (verified) Patient has no known allergies.   History: Past Medical History:  Diagnosis Date  . Anemia 10/02/2019   DUE TO BLOOD LOSS  . Blind   . Disorder of bone and cartilage, unspecified   . Edema   . Elevated prostate specific antigen (PSA)   . Hypertension   . Hypopotassemia   . Nonspecific abnormal results of liver function study   . Unspecified glaucoma(365.9)    Past Surgical History:  Procedure Laterality Date  . CYSTOSCOPY WITH DIRECT VISION INTERNAL URETHROTOMY N/A 10/02/2019   Procedure: CYSTOSCOPY WITH DIRECT VISION INTERNAL URETHROTOMY WITH CLOT EVACUATION retrograd urethralgram Coverted to Open.;  Surgeon: Lucas Mallow, MD;  Location: [facility];  Service: Urology;  Laterality: N/A;  . CYSTOSCOPY WITH FULGERATION N/A 01/17/2018   Procedure: CYSTOSCOPY CLOT EVACUATION WITH FULGERATION TUR BIOPSY OF BLADDER NECK;  Surgeon: Jeffie Pollock,  Jonny Ruiz, MD;  Location: [facility];  Service: Urology;  Laterality: N/A;  . CYSTOSTOMY N/A 10/02/2019   Procedure: Open Cystostomy Suprapubic tube placement and JP drain and urethral dilation;  Surgeon: Crista Elliot, MD;  [facility];  Service: Urology;  Laterality: N/A;  . EYE SURGERY Bilateral 2009   for glaucoma  . EYE SURGERY Left 2014  . HERNIA REPAIR Right 2003   Dr. Laroy Apple  . IR ANGIOGRAM EXTREMITY RIGHT   10/03/2019  . IR ANGIOGRAM PELVIS SELECTIVE OR SUPRASELECTIVE  10/03/2019  . IR ANGIOGRAM PELVIS SELECTIVE OR SUPRASELECTIVE  10/03/2019  . IR ANGIOGRAM SELECTIVE EACH ADDITIONAL VESSEL  10/03/2019  . IR ANGIOGRAM SELECTIVE EACH ADDITIONAL VESSEL  10/03/2019  . IR ANGIOGRAM SELECTIVE EACH ADDITIONAL VESSEL  10/03/2019  . IR EMBO ART  VEN HEMORR LYMPH EXTRAV  INC GUIDE ROADMAPPING  10/03/2019  . IR US GUIDE VASC ACCESS RIGHT  10/03/2019  . LAPAROSCOPIC APPENDECTOMY N/A 12/24/2012   Procedure:  LAPAROSCOPIC APPENDECTOMY ;  Surgeon: Valarie Merino, MD;  Location: [facility];  Service: General;  Laterality: N/A;  . TRANSURETHRAL RESECTION OF PROSTATE N/A 05/27/2019   Procedure: Cystoscopy Clot Evacuation and Fulgration;  Surgeon: Crist Fat, MD;  Location: [facility];  Service: Urology;  Laterality: N/A;   Family History  Problem Relation Age of Onset  . Kidney failure Mother    Social History   Socioeconomic History  . Marital status: Legally Separated    Spouse name: Not on file  . Number of children: Not on file  . Years of education: Not on file  . Highest education level: Not on file  Occupational History  . Occupation: retired Tree surgeon  Tobacco Use  . Smoking status: Former Smoker    Types: Cigarettes    Quit date: 06/05/1968    Years since quitting: 52.1  . Smokeless tobacco: Never Used  Vaping Use  . Vaping Use: Never used  Substance and Sexual Activity  . Alcohol use: No    Comment: STOPPED IN 1970S  . Drug use: No  . Sexual activity: Never  Other Topics Concern  . Not on file  Social History Narrative  . Not on file   Social Determinants of Health   Financial Resource Strain: Not on file  Food Insecurity: Food Insecurity Present  . Worried About Programme researcher, broadcasting/film/video in the Last Year: Sometimes true  . Ran Out of Food in the Last Year: Sometimes true  Transportation Needs: No Transportation Needs  . Lack of Transportation (Medical): No  . Lack of Transportation  (Non-Medical): No  Physical Activity: Not on file  Stress: Not on file  Social Connections: Socially Isolated  . Frequency of Communication with Friends and Family: Twice a week  . Frequency of Social Gatherings with Friends and Family: Twice a week  . Attends Religious Services: Never  . Active Member of Clubs or Organizations: No  . Attends Banker Meetings: Never  . Marital Status: Separated    Tobacco Counseling Counseling given: Not Answered   Clinical Intake:  Pre-visit preparation completed: Yes  Pain : No/denies pain     BMI - recorded: 28 Nutritional Status: BMI 25 -29 Overweight Nutritional Risks: None Diabetes: No  How often do you need to have someone help you when you read instructions, pamphlets, or other written materials from your doctor or pharmacy?: 4 - Often  Diabetic?no         Activities of Daily Living In your  present state of health, do you have any difficulty performing the following activities: 07/06/2020 10/02/2019  Hearing? N N  Vision? Y Y  Comment - wears glasses, left in room with other personal belongings  Difficulty concentrating or making decisions? N N  Walking or climbing stairs? N -  Dressing or bathing? N N  Doing errands, shopping? Y -  Conservation officer, nature and eating ? N -  Using the Toilet? N -  In the past six months, have you accidently leaked urine? N -  Do you have problems with loss of bowel control? N -  Managing your Medications? N -  Managing your Finances? N -  Housekeeping or managing your Housekeeping? N -  Some recent data might be hidden    Patient Care Team: [name], NP as PCP - General (Geriatric Medicine)  Indicate any recent Medical Services you may have received from other than [facility] providers in the past year (date may be approximate).     Assessment:   This is a routine wellness examination for [name] [name].  Hearing/Vision screen  Hearing Screening   125Hz  250Hz  500Hz  1000Hz   2000Hz  3000Hz  4000Hz  6000Hz  8000Hz   Right ear:           Left ear:           Comments: Patient has no hearing problems.  Vision Screening Comments: Patient is visually impaired  Dietary issues and exercise activities discussed: Current Exercise Habits: Home exercise routine, Type of exercise: calisthenics;strength training/weights;walking, Time (Minutes): 15, Frequency (Times/Week): 3, Weekly Exercise (Minutes/Week): 45  Goals    . Increase physical activity     Increase physical activity to 30 mins daily      Depression Screen PHQ 2/9 Scores 07/06/2020 01/29/2020 12/02/2019 07/02/2019 09/17/2012  PHQ - 2 Score 0 0 0 0 0    Fall Risk Fall Risk  07/06/2020 04/01/2020 02/27/2020 12/22/2019 12/02/2019  Falls in the past year? 0 0 0 0 0  Comment - - - - -  Number falls in past yr: 1 0 0 0 0  Injury with Fall? 0 0 0 0 0  Follow up - Falls evaluation completed - - Falls evaluation completed    FALL RISK PREVENTION PERTAINING TO THE HOME:  Any stairs in or around the home? Yes  If so, are there any without handrails? no Home free of loose throw rugs in walkways, pet beds, electrical cords, etc? Yes  Adequate lighting in your home to reduce risk of falls? No   ASSISTIVE DEVICES UTILIZED TO PREVENT FALLS:  Life alert? No  Use of a cane, walker or w/c? Yes  Grab bars in the bathroom? Yes  Shower chair or bench in shower? No  Elevated toilet seat or a handicapped toilet? No   TIMED UP AND GO:  Was the test performed? No .    Cognitive Function: MMSE - Mini Mental State Exam 02/16/2016  Orientation to time 5  Orientation to Place 5  Registration 3  Attention/ Calculation 5  Recall 3  Language- name 2 objects 2  Language- repeat 1  Language- follow 3 step command 3  Language- read & follow direction 1  Write a sentence 1  Copy design 1  Total score 30     6CIT Screen 07/06/2020 07/02/2019  What Year? 0 points 0 points  What month? 3 points 0 points  What time? 0 points 0  points  Count back from 20 0 points 0 points  Months in reverse 0  points 0 points  Repeat phrase 0 points 4 points  Total Score 3 4    Immunizations Immunization History  Administered Date(s) Administered  . PFIZER(Purple Top)SARS-COV-2 Vaccination 10/17/2019, 11/07/2019  . Pneumococcal Conjugate-13 07/02/2019  . Td 12/12/2000    TDAP status: Due, Education has been provided regarding the importance of this vaccine. Advised may receive this vaccine at local pharmacy or Health Dept. Aware to provide a copy of the vaccination record if obtained from local pharmacy or Health Dept. Verbalized acceptance and understanding.  Flu Vaccine status: Up to date  Pneumococcal vaccine status: Due, Education has been provided regarding the importance of this vaccine. Advised may receive this vaccine at local pharmacy or Health Dept. Aware to provide a copy of the vaccination record if obtained from local pharmacy or Health Dept. Verbalized acceptance and understanding.  Covid-19 vaccine status: Information provided on how to obtain vaccines.   Qualifies for Shingles Vaccine? Yes   Zostavax completed No   Shingrix Completed?: Yes  Screening Tests Health Maintenance  Topic Date Due  . INFLUENZA VACCINE  Never done  . COVID-19 Vaccine (3 - Booster for Pfizer series) 05/08/2020  . PNA vac Low Risk Adult (2 of 2 - PPSV23) 07/01/2020  . COLONOSCOPY (Pts 45-81yrs Insurance coverage will need to be confirmed)  02/15/2026 (Originally 03/07/1991)  . TETANUS/TDAP  02/15/2026 (Originally 12/13/2010)  . Hepatitis C Screening  Completed    Health Maintenance  Health Maintenance Due  Topic Date Due  . INFLUENZA VACCINE  Never done  . COVID-19 Vaccine (3 - Booster for Pfizer series) 05/08/2020  . PNA vac Low Risk Adult (2 of 2 - PPSV23) 07/01/2020     Pt denies colorectal screening, even after being educated on risk.   Lung Cancer Screening: (Low Dose CT Chest recommended if Age 89-80 years, 30  pack-year currently smoking OR have quit w/in 15years.) does not qualify.    Additional Screening:  Hepatitis C Screening: does qualify; Completed 02/23/2020  Vision Screening: Recommended annual ophthalmology exams for early detection of glaucoma and other disorders of the eye. Is the patient up to date with their annual eye exam?  No  Who is the provider or what is the name of the office in which the patient attends annual eye exams? [facility] If pt is not established with a provider, would they like to be referred to a provider to establish care? No .   Dental Screening: Recommended annual dental exams for proper oral hygiene  Community Resource Referral / Chronic Care Management: CRR required this visit?  No   CCM required this visit?  No      Plan:     I have personally reviewed and noted the following in the patient's chart:   . Medical and social history . Use of alcohol, tobacco or illicit drugs  . Current medications and supplements . Functional ability and status . Nutritional status . Physical activity . Advanced directives . List of other physicians . Hospitalizations, surgeries, and ER visits in previous 12 months . Vitals . Screenings to include cognitive, depression, and falls . Referrals and appointments  In addition, I have reviewed and discussed with patient certain preventive protocols, quality metrics, and best practice recommendations. A written personalized care plan for preventive services as well as general preventive health recommendations were provided to patient.     Sharon Seller, NP   07/06/2020    Virtual Visit via Telephone Note  I connected with@ on 07/06/20 at 11:00 AM  EST by telephone and verified that I am speaking with the correct person using two identifiers.  Location: Patient: home Provider: [place]   I discussed the limitations, risks, security and privacy concerns of performing an evaluation and management service by  telephone and the availability of in person appointments. I also discussed with the patient that there may be a patient responsible charge related to this service. The patient expressed understanding and agreed to proceed.   I discussed the assessment and treatment plan with the patient. The patient was provided an opportunity to ask questions and all were answered. The patient agreed with the plan and demonstrated an understanding of the instructions.   The patient was advised to call back or seek an in-person evaluation if the symptoms worsen or if the condition fails to improve as anticipated.  I provided 18 minutes of non-face-to-face time during this encounter.  Carlos American. Harle Battiest Avs printed and mailed

## 2020-07-06 NOTE — Patient Instructions (Addendum)
[name] [name] , Thank you for taking time to come for your Medicare Wellness Visit. I appreciate your ongoing commitment to your health goals. Please review the following plan we discussed and let me know if I can assist you in the future.   Screening recommendations/referrals: Colonoscopy RECOMMENDED- you have decline at this time but if you would like we can refer you to GI or send off for cologuard.  Recommended yearly ophthalmology/optometry visit for glaucoma screening and checkup Recommended yearly dental visit for hygiene and checkup  Vaccinations: Influenza vaccine RECOMMENDED yearly.  Pneumococcal vaccine RECOMMENDED- to get pneumococcal 23- can get in office or your local pharmacy  Tdap vaccine RECOMMENDED- to get at your local pharmacy  Shingles vaccine RECOMMENDED- to get Shingrix at your local pharmacy  Advanced directives: recommended to complete   Conditions/risks identified: age, vision loss, recommend routine follow up.   Next appointment: yearly for AWV   Preventive Care 43 Years and Older, Male Preventive care refers to lifestyle choices and visits with your health care provider that can promote health and wellness. What does preventive care include?  A yearly physical exam. This is also called an annual well check.  Dental exams once or twice a year.  Routine eye exams. Ask your health care provider how often you should have your eyes checked.  Personal lifestyle choices, including:  Daily care of your teeth and gums.  Regular physical activity.  Eating a healthy diet.  Avoiding tobacco and drug use.  Limiting alcohol use.  Practicing safe sex.  Taking low doses of aspirin every day.  Taking vitamin and mineral supplements as recommended by your health care provider. What happens during an annual well check? The services and screenings done by your health care provider during your annual well check will depend on your age, overall health, lifestyle  risk factors, and family history of disease. Counseling  Your health care provider may ask you questions about your:  Alcohol use.  Tobacco use.  Drug use.  Emotional well-being.  Home and relationship well-being.  Sexual activity.  Eating habits.  History of falls.  Memory and ability to understand (cognition).  Work and work Statistician. Screening  You may have the following tests or measurements:  Height, weight, and BMI.  Blood pressure.  Lipid and cholesterol levels. These may be checked every 5 years, or more frequently if you are over 87 years old.  Skin check.  Lung cancer screening. You may have this screening every year starting at age 59 if you have a 30-pack-year history of smoking and currently smoke or have quit within the past 15 years.  Fecal occult blood test (FOBT) of the stool. You may have this test every year starting at age 60.  Flexible sigmoidoscopy or colonoscopy. You may have a sigmoidoscopy every 5 years or a colonoscopy every 10 years starting at age 40.  Prostate cancer screening. Recommendations will vary depending on your family history and other risks.  Hepatitis C blood test.  Hepatitis B blood test.  Sexually transmitted disease (STD) testing.  Diabetes screening. This is done by checking your blood sugar (glucose) after you have not eaten for a while (fasting). You may have this done every 1-3 years.  Abdominal aortic aneurysm (AAA) screening. You may need this if you are a current or former smoker.  Osteoporosis. You may be screened starting at age 33 if you are at high risk. Talk with your health care provider about your test results, treatment options, and  if necessary, the need for more tests. Vaccines  Your health care provider may recommend certain vaccines, such as:  Influenza vaccine. This is recommended every year.  Tetanus, diphtheria, and acellular pertussis (Tdap, Td) vaccine. You may need a Td booster every 10  years.  Zoster vaccine. You may need this after age 52.  Pneumococcal 13-valent conjugate (PCV13) vaccine. One dose is recommended after age 82.  Pneumococcal polysaccharide (PPSV23) vaccine. One dose is recommended after age 40. Talk to your health care provider about which screenings and vaccines you need and how often you need them. This information is not intended to replace advice given to you by your health care provider. Make sure you discuss any questions you have with your health care provider. Document Released: 06/18/2015 Document Revised: 02/09/2016 Document Reviewed: 03/23/2015 Elsevier Interactive Patient Education  2017 [place] Prevention in the Home Falls can cause injuries. They can happen to people of all ages. There are many things you can do to make your home safe and to help prevent falls. What can I do on the outside of my home?  Regularly fix the edges of walkways and driveways and fix any cracks.  Remove anything that might make you trip as you walk through a door, such as a raised step or threshold.  Trim any bushes or trees on the path to your home.  Use bright outdoor lighting.  Clear any walking paths of anything that might make someone trip, such as rocks or tools.  Regularly check to see if handrails are loose or broken. Make sure that both sides of any steps have handrails.  Any raised decks and porches should have guardrails on the edges.  Have any leaves, snow, or ice cleared regularly.  Use sand or salt on walking paths during winter.  Clean up any spills in your garage right away. This includes oil or grease spills. What can I do in the bathroom?  Use night lights.  Install grab bars by the toilet and in the tub and shower. Do not use towel bars as grab bars.  Use non-skid mats or decals in the tub or shower.  If you need to sit down in the shower, use a plastic, non-slip stool.  Keep the floor dry. Clean up any water that  spills on the floor as soon as it happens.  Remove soap buildup in the tub or shower regularly.  Attach bath mats securely with double-sided non-slip rug tape.  Do not have throw rugs and other things on the floor that can make you trip. What can I do in the bedroom?  Use night lights.  Make sure that you have a light by your bed that is easy to reach.  Do not use any sheets or blankets that are too big for your bed. They should not hang down onto the floor.  Have a firm chair that has side arms. You can use this for support while you get dressed.  Do not have throw rugs and other things on the floor that can make you trip. What can I do in the kitchen?  Clean up any spills right away.  Avoid walking on wet floors.  Keep items that you use a lot in easy-to-reach places.  If you need to reach something above you, use a strong step stool that has a grab bar.  Keep electrical cords out of the way.  Do not use floor polish or wax that makes floors slippery. If you  must use wax, use non-skid floor wax.  Do not have throw rugs and other things on the floor that can make you trip. What can I do with my stairs?  Do not leave any items on the stairs.  Make sure that there are handrails on both sides of the stairs and use them. Fix handrails that are broken or loose. Make sure that handrails are as long as the stairways.  Check any carpeting to make sure that it is firmly attached to the stairs. Fix any carpet that is loose or worn.  Avoid having throw rugs at the top or bottom of the stairs. If you do have throw rugs, attach them to the floor with carpet tape.  Make sure that you have a light switch at the top of the stairs and the bottom of the stairs. If you do not have them, ask someone to add them for you. What else can I do to help prevent falls?  Wear shoes that:  Do not have high heels.  Have rubber bottoms.  Are comfortable and fit you well.  Are closed at the  toe. Do not wear sandals.  If you use a stepladder:  Make sure that it is fully opened. Do not climb a closed stepladder.  Make sure that both sides of the stepladder are locked into place.  Ask someone to hold it for you, if possible.  Clearly mark and make sure that you can see:  Any grab bars or handrails.  First and last steps.  Where the edge of each step is.  Use tools that help you move around (mobility aids) if they are needed. These include:  Canes.  Walkers.  Scooters.  Crutches.  Turn on the lights when you go into a dark area. Replace any light bulbs as soon as they burn out.  Set up your furniture so you have a clear path. Avoid moving your furniture around.  If any of your floors are uneven, fix them.  If there are any pets around you, be aware of where they are.  Review your medicines with your doctor. Some medicines can make you feel dizzy. This can increase your chance of falling. Ask your doctor what other things that you can do to help prevent falls. This information is not intended to replace advice given to you by your health care provider. Make sure you discuss any questions you have with your health care provider. Document Released: 03/18/2009 Document Revised: 10/28/2015 Document Reviewed: 06/26/2014 Elsevier Interactive Patient Education  2017 [employer].

## 2020-07-14 ENCOUNTER — Encounter: Payer: Self-pay | Admitting: Nurse Practitioner

## 2020-07-14 ENCOUNTER — Telehealth: Payer: Self-pay | Admitting: Nurse Practitioner

## 2020-07-14 NOTE — Telephone Encounter (Signed)
I have called the patient numerous times and left messages to schedule a follow-up from November and gotten no response. KM

## 2020-07-14 NOTE — Telephone Encounter (Signed)
Please mail letter requesting follow up.

## 2020-07-16 DIAGNOSIS — T83010A Breakdown (mechanical) of cystostomy catheter, initial encounter: Secondary | ICD-10-CM | POA: Diagnosis not present

## 2020-07-16 DIAGNOSIS — R309 Painful micturition, unspecified: Secondary | ICD-10-CM | POA: Diagnosis not present

## 2020-07-16 DIAGNOSIS — X58XXXA Exposure to other specified factors, initial encounter: Secondary | ICD-10-CM | POA: Diagnosis not present

## 2020-07-16 DIAGNOSIS — Y998 Other external cause status: Secondary | ICD-10-CM | POA: Diagnosis not present

## 2020-07-19 NOTE — Progress Notes (Signed)
Patient ID: [name] Ast., male   DOB: [birth year], 75 y.o.   MRN: [id number] I have called patient 6 times and left message to schedule Ultrasound   He has not returned my call  He was scheduled on 03-31-20 and cancelled the appointment

## 2020-07-20 ENCOUNTER — Other Ambulatory Visit: Payer: Self-pay | Admitting: Nurse Practitioner

## 2020-08-02 DIAGNOSIS — R6889 Other general symptoms and signs: Secondary | ICD-10-CM | POA: Diagnosis not present

## 2020-08-17 ENCOUNTER — Other Ambulatory Visit: Payer: Self-pay | Admitting: Nurse Practitioner

## 2020-09-02 ENCOUNTER — Other Ambulatory Visit: Payer: Self-pay

## 2020-09-02 ENCOUNTER — Emergency Department (HOSPITAL_BASED_OUTPATIENT_CLINIC_OR_DEPARTMENT_OTHER)
Admission: EM | Admit: 2020-09-02 | Discharge: 2020-09-02 | Disposition: A | Discharge status: Home / Self Care | Payer: Medicare Other | Attending: Emergency Medicine | Admitting: Emergency Medicine

## 2020-09-02 ENCOUNTER — Encounter (HOSPITAL_BASED_OUTPATIENT_CLINIC_OR_DEPARTMENT_OTHER): Payer: Self-pay

## 2020-09-02 DIAGNOSIS — Z79899 Other long term (current) drug therapy: Secondary | ICD-10-CM | POA: Insufficient documentation

## 2020-09-02 DIAGNOSIS — Z87891 Personal history of nicotine dependence: Secondary | ICD-10-CM | POA: Insufficient documentation

## 2020-09-02 DIAGNOSIS — K4090 Unilateral inguinal hernia, without obstruction or gangrene, not specified as recurrent: Secondary | ICD-10-CM

## 2020-09-02 DIAGNOSIS — K469 Unspecified abdominal hernia without obstruction or gangrene: Secondary | ICD-10-CM | POA: Insufficient documentation

## 2020-09-02 DIAGNOSIS — R609 Edema, unspecified: Secondary | ICD-10-CM | POA: Diagnosis not present

## 2020-09-02 DIAGNOSIS — I10 Essential (primary) hypertension: Secondary | ICD-10-CM | POA: Diagnosis not present

## 2020-09-02 DIAGNOSIS — T83098A Other mechanical complication of other indwelling urethral catheter, initial encounter: Secondary | ICD-10-CM | POA: Insufficient documentation

## 2020-09-02 DIAGNOSIS — Y846 Urinary catheterization as the cause of abnormal reaction of the patient, or of later complication, without mention of misadventure at the time of the procedure: Secondary | ICD-10-CM | POA: Insufficient documentation

## 2020-09-02 DIAGNOSIS — R600 Localized edema: Secondary | ICD-10-CM | POA: Diagnosis not present

## 2020-09-02 LAB — CBC
HCT: 41 % (ref 39.0–52.0)
Hemoglobin: 12.9 g/dL — ABNORMAL LOW (ref 13.0–17.0)
MCH: 26.4 pg (ref 26.0–34.0)
MCHC: 31.5 g/dL (ref 30.0–36.0)
MCV: 84 fL (ref 80.0–100.0)
Platelets: 174 10*3/uL (ref 150–400)
RBC: 4.88 MIL/uL (ref 4.22–5.81)
RDW: 16.8 % — ABNORMAL HIGH (ref 11.5–15.5)
WBC: 4.9 10*3/uL (ref 4.0–10.5)
nRBC: 0 % (ref 0.0–0.2)

## 2020-09-02 LAB — COMPREHENSIVE METABOLIC PANEL
ALT: 24 U/L (ref 0–44)
AST: 36 U/L (ref 15–41)
Albumin: 3.7 g/dL (ref 3.5–5.0)
Alkaline Phosphatase: 92 U/L (ref 38–126)
Anion gap: 13 (ref 5–15)
BUN: 23 mg/dL (ref 8–23)
CO2: 20 mmol/L — ABNORMAL LOW (ref 22–32)
Calcium: 8.6 mg/dL — ABNORMAL LOW (ref 8.9–10.3)
Chloride: 107 mmol/L (ref 98–111)
Creatinine, Ser: 0.98 mg/dL (ref 0.61–1.24)
GFR, Estimated: >60 mL/min (ref >60)
Glucose, Bld: 75 mg/dL (ref 70–99)
Potassium: 4 mmol/L (ref 3.5–5.1)
Sodium: 140 mmol/L (ref 135–145)
Total Bilirubin: 0.9 mg/dL (ref 0.3–1.2)
Total Protein: 7 g/dL (ref 6.5–8.1)

## 2020-09-02 LAB — URINALYSIS, ROUTINE W REFLEX MICROSCOPIC
Bilirubin Urine: NEGATIVE
Glucose, UA: NEGATIVE mg/dL
Ketones, ur: NEGATIVE mg/dL
Nitrite: NEGATIVE
Protein, ur: 100 mg/dL — AB
RBC / HPF: >50 RBC/hpf — ABNORMAL HIGH (ref 0–5)
Specific Gravity, Urine: 1.014 (ref 1.005–1.030)
WBC, UA: >50 WBC/hpf — ABNORMAL HIGH (ref 0–5)
pH: 7.5 (ref 5.0–8.0)

## 2020-09-02 NOTE — ED Notes (Signed)
Bladder Scan Complete 87 mL noted

## 2020-09-02 NOTE — Discharge Instructions (Signed)
Call [facility] tomorrow for an appointment tomorrow or Monday to have your suprapubic catheter evaluated.

## 2020-09-02 NOTE — ED Provider Notes (Addendum)
[facility] EMERGENCY DEPT Provider Note   CSN: [id number] Arrival date & time: 09/02/20  1705     History Chief Complaint  Patient presents with  . urinary catheter leaking    [name] [name]. is a 75 y.o. male.  HPI 75 year old male presents with suprapubic catheter dysfunction.  Ongoing on and off for a month.  He has been trying to flush it.  Has variable response with urine.  Due to this obstruction he is getting leg swelling which he states happens every time.  He has had a suprapubic catheter in for at least a month and overall has had a suprapubic catheter for over 8 months.  No trouble breathing or chest pain.  No abdominal pain.   Past Medical History:  Diagnosis Date  . Anemia 10/02/2019   DUE TO BLOOD LOSS  . Blind   . Disorder of bone and cartilage, unspecified   . Edema   . Elevated prostate specific antigen (PSA)   . Hypertension   . Hypopotassemia   . Nonspecific abnormal results of liver function study   . Unspecified glaucoma(365.9)     Patient Active Problem List   Diagnosis Date Noted  . Swelling of lower leg 10/02/2019  . 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 Thrombocytosis 09/13/2019  . Urinary retention 09/13/2019  . AKI (acute kidney injury) ([place]) 05/25/2019  . BPH (benign prostatic hyperplasia) 05/25/2019  . Anemia due to blood loss, chronic   . Acute blood loss anemia 01/14/2018  . Hypokalemia 01/14/2018  . 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 Hematuria 12/23/2012  . Chest pain 12/23/2012  . Anemia due to blood loss, acute 12/23/2012  . Elevated prostate specific antigen (PSA)   . Hypertension   . Edema   . Severe stage glaucoma 08/14/2011  . 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 CYSTOSCOPY WITH DIRECT VISION INTERNAL URETHROTOMY N/A 10/02/2019   Procedure: CYSTOSCOPY WITH DIRECT  VISION INTERNAL URETHROTOMY WITH CLOT EVACUATION retrograd urethralgram Coverted to Open.;  Surgeon: Lucas Mallow, MD;  Location: [facility];  Service: Urology;  Laterality: N/A;  . CYSTOSCOPY WITH FULGERATION N/A 01/17/2018   Procedure: CYSTOSCOPY CLOT EVACUATION WITH FULGERATION TUR BIOPSY OF BLADDER NECK;  Surgeon: Irine Seal, MD;  Location: [facility];  Service: Urology;  Laterality: N/A;  . CYSTOSTOMY N/A 10/02/2019   Procedure: Open Cystostomy Suprapubic tube placement and JP drain and urethral dilation;  Surgeon: Lucas Mallow, MD;  Location: [facility];  Service: Urology;  Laterality: N/A;  . EYE SURGERY Bilateral 2009   for glaucoma  . EYE SURGERY Left 2014  . HERNIA REPAIR Right 2003   Dr. Jeraldine Loots  . IR ANGIOGRAM EXTREMITY RIGHT  10/03/2019  . IR ANGIOGRAM PELVIS SELECTIVE OR SUPRASELECTIVE  10/03/2019  . IR ANGIOGRAM PELVIS SELECTIVE OR SUPRASELECTIVE  10/03/2019  . IR ANGIOGRAM SELECTIVE EACH ADDITIONAL VESSEL  10/03/2019  . IR ANGIOGRAM SELECTIVE EACH ADDITIONAL VESSEL  10/03/2019  . IR ANGIOGRAM SELECTIVE EACH ADDITIONAL VESSEL  10/03/2019  . IR EMBO ART  VEN HEMORR LYMPH EXTRAV  INC GUIDE ROADMAPPING  10/03/2019  . IR US GUIDE VASC ACCESS RIGHT  10/03/2019  . LAPAROSCOPIC APPENDECTOMY N/A 12/24/2012   Procedure:  LAPAROSCOPIC APPENDECTOMY ;  Surgeon: Pedro Earls, MD;  Location: [facility];  Service: General;  Laterality: N/A;  . TRANSURETHRAL RESECTION OF PROSTATE N/A 05/27/2019  Procedure: Cystoscopy Clot Evacuation and Fulgration;  Surgeon: Ardis Hughs, MD;  Location: [facility];  Service: Urology;  Laterality: N/A;       Family History  Problem Relation Age of Onset  . Kidney failure Mother     Social History   Tobacco Use  . Smoking status: Former Smoker    Types: Cigarettes    Quit date: 06/05/1968    Years since quitting: 52.2  . Smokeless tobacco: Never Used  Vaping Use  . Vaping Use: Never used  Substance Use Topics  . Alcohol use: No    Comment: STOPPED  IN 1970S  . Drug use: No    Home Medications Prior to Admission medications   Medication Sig Start Date End Date Taking? Authorizing Provider  Ascorbic Acid (VITAMIN C WITH ROSE HIPS) 1000 MG tablet Take 2,000 mg by mouth daily with lunch.     [provider]  aspirin-acetaminophen-caffeine (EXCEDRIN MIGRAINE) [phone] MG tablet Take 1 tablet by mouth daily as needed (pain).     [provider]  B Complex Vitamins (VITAMIN-B COMPLEX PO) Take 1 tablet by mouth daily.    [provider]  brimonidine (ALPHAGAN) 0.2 % ophthalmic solution Place 1 drop into both eyes 2 (two) times daily. 03/11/19   [provider]  dorzolamide-timolol (COSOPT) 22.3-6.8 MG/ML ophthalmic solution APPOINTMENT OVERDUE, instill 1 drop in both eyes twice daily 07/14/13   [name], NP  finasteride (PROSCAR) 5 MG tablet Take 5 mg by mouth daily. 03/10/20   [provider]  folic acid (FOLVITE) 756 MCG tablet Take 800 mcg by mouth 2 (two) times a week. Twice a week    [provider]  hydrochlorothiazide (MICROZIDE) 12.5 MG capsule Take one capsule by mouth once daily. Needs an appointment before anymore future refills. 07/20/20   [name], NP  Magnesium 250 MG TABS Take 250 mg by mouth daily as needed (migraine headache).     [provider]  pilocarpine (PILOCAR) 2 % ophthalmic solution Place 1 drop into the right eye every 6 (six) hours.  12/25/17   [provider]  potassium chloride SA (KLOR-CON) 20 MEQ tablet TAKE 1 TABLET(20 MEQ) BY MOUTH DAILY Patient taking differently: Take 20 mEq by mouth daily. 12/25/19   [name], NP  pyridOXINE (VITAMIN B-6) 50 MG tablet Take 50 mg by mouth daily with lunch.     [provider]  vitamin B-12 (CYANOCOBALAMIN) 1000 MCG tablet Take 1,000 mcg by mouth daily with lunch.    [provider]    Allergies    Patient has no known allergies.  Review of Systems   Review of  Systems  Constitutional: Negative for fever.  Respiratory: Negative for shortness of breath.   Cardiovascular: Positive for leg swelling.  Gastrointestinal: Negative for abdominal pain.  Genitourinary: Positive for decreased urine volume and difficulty urinating.  All other systems reviewed and are negative.   Physical Exam Updated Vital Signs BP (!) 158/89   Pulse 64   Temp 98.1 F (36.7 C) (Oral)   Resp 14   Ht 5\' 8"  (1.727 m)   Wt 80.6 kg   SpO2 100%   BMI 27.02 kg/m   Physical Exam Vitals and nursing note reviewed.  Constitutional:      Appearance: He is well-developed.  HENT:     Head: Normocephalic and atraumatic.     Right Ear: External ear normal.     Left Ear: External ear normal.  Nose: Nose normal.  Eyes:     General:        Right eye: No discharge.        Left eye: No discharge.  Cardiovascular:     Rate and Rhythm: Normal rate and regular rhythm.     Heart sounds: Normal heart sounds.  Pulmonary:     Effort: Pulmonary effort is normal.     Breath sounds: Normal breath sounds. No rales.  Abdominal:     General: There is distension.     Palpations: Abdomen is soft.     Tenderness: There is no abdominal tenderness.     Hernia: A hernia is present. Hernia is present in the left inguinal area (soft, reducible).     Comments: Suprapubic catheter in place. Minimal drainage around it. No erythema  Musculoskeletal:     Cervical back: Neck supple.     Right lower leg: Edema present.     Left lower leg: Edema present.  Skin:    General: Skin is warm and dry.  Neurological:     Mental Status: He is alert.  Psychiatric:        Mood and Affect: Mood is not anxious.     ED Results / Procedures / Treatments   Labs (all labs ordered are listed, but only abnormal results are displayed) Labs Reviewed  URINALYSIS, ROUTINE W REFLEX MICROSCOPIC - Abnormal; Notable for the following components:      Result Value   APPearance HAZY (*)    Hgb urine dipstick  MODERATE (*)    Protein, ur 100 (*)    Leukocytes,Ua LARGE (*)    RBC / HPF >50 (*)    WBC, UA >50 (*)    Bacteria, UA MANY (*)    All other components within normal limits  CBC - Abnormal; Notable for the following components:   Hemoglobin 12.9 (*)    RDW 16.8 (*)    All other components within normal limits  COMPREHENSIVE METABOLIC PANEL - Abnormal; Notable for the following components:   CO2 20 (*)    Calcium 8.6 (*)    All other components within normal limits  URINE CULTURE    EKG None  Radiology No results found.  Procedures Procedures   Medications Ordered in ED Medications - No data to display  ED Course  I have reviewed the triage vital signs and the nursing notes.  Pertinent labs & imaging results that were available during my care of the patient were reviewed by me and considered in my medical decision making (see chart for details).    MDM Rules/Calculators/A&P                          When patient first arrived, his suprapubic catheter was not connected to the bag.  However given his complaints and concerns that the catheter was not working properly I attempted to replace his suprapubic.  However, the initial catheter was not able to be removed.  Despite aspirating 5 cc of saline from the balloon, there is still resistance at the end of the catheter when trying to remove.  However it still freely goes back into the bladder.  Unable to remove any further saline.  Given this, the catheter was left in place and it was noted that he only had 80 cc in his bladder on bladder scan.  The urine is now clearly draining.  I think part of this is he is not drinking  as well because he is concerned it is obstructed.  He seems to correlate catheter dysfunction with peripheral edema but right now he does not have any renal dysfunction and chart review shows he has peripheral edema quite often.  At this point, given the complexity of a suprapubic catheter situation and he does not  need emergent removal given his working, I think he should follow-up with [facility].  No indication for transfer or admission.  No urinary symptoms otherwise or abdominal pain so I do not think his urine represents UTI but rather chronic contamination from the catheter.  Discharged home with return precautions.  Of note, patient also has a chronic left inguinal hernia that he states has been there for a long time.  He is always able to push it back in.  He would like a referral to general surgery which I will give. Final Clinical Impression(s) / ED Diagnoses Final diagnoses:  Peripheral edema    Rx / DC Orders ED Discharge Orders    None       Sherwood Gambler, MD 09/02/20 2116    Sherwood Gambler, MD 09/02/20 2208

## 2020-09-02 NOTE — ED Notes (Signed)
ED Provider at bedside. 

## 2020-09-02 NOTE — ED Notes (Signed)
4 ML taken out of balloon with Dr. Regenia Skeeter present in room. We were unable to move the cathter tubing out. Attempted to removed more saline from balloon and was unable to retreive. 5 mL was placed into balloon and then 5 removed to test balloon. Then attempted to irrigate and was unsuccessful. Cathter was then. Information passed to oncoming Rn who will reevaluate cathter either attempt to remove from the balloon or replace to original amount.

## 2020-09-02 NOTE — ED Notes (Signed)
Patient transported by this RN to [facility] personally. Discharge paperwork and belongings are sent with patient.

## 2020-09-02 NOTE — ED Notes (Signed)
Pt verbalizes understanding of discharge instructions. Opportunity for questioning and answers were provided. [place] removed by staff, pt discharged from ED in wheelchair to home with [name].

## 2020-09-02 NOTE — ED Triage Notes (Signed)
Pt reports having indwelling catheter for aroun 1 year and now is blocked and Pt reports having leaking of the urine and swelling of the lower legs.

## 2020-09-03 ENCOUNTER — Other Ambulatory Visit: Payer: Self-pay

## 2020-09-03 ENCOUNTER — Emergency Department (HOSPITAL_BASED_OUTPATIENT_CLINIC_OR_DEPARTMENT_OTHER)
Admission: EM | Admit: 2020-09-03 | Discharge: 2020-09-03 | Disposition: A | Discharge status: Home / Self Care | Payer: Medicare Other | Attending: Emergency Medicine | Admitting: Emergency Medicine

## 2020-09-03 ENCOUNTER — Encounter (HOSPITAL_BASED_OUTPATIENT_CLINIC_OR_DEPARTMENT_OTHER): Payer: Self-pay

## 2020-09-03 DIAGNOSIS — Y846 Urinary catheterization as the cause of abnormal reaction of the patient, or of later complication, without mention of misadventure at the time of the procedure: Secondary | ICD-10-CM | POA: Insufficient documentation

## 2020-09-03 DIAGNOSIS — T83091A Other mechanical complication of indwelling urethral catheter, initial encounter: Secondary | ICD-10-CM | POA: Diagnosis not present

## 2020-09-03 DIAGNOSIS — I10 Essential (primary) hypertension: Secondary | ICD-10-CM | POA: Insufficient documentation

## 2020-09-03 DIAGNOSIS — T83010A Breakdown (mechanical) of cystostomy catheter, initial encounter: Secondary | ICD-10-CM

## 2020-09-03 DIAGNOSIS — Z79899 Other long term (current) drug therapy: Secondary | ICD-10-CM | POA: Diagnosis not present

## 2020-09-03 DIAGNOSIS — T83198A Other mechanical complication of other urinary devices and implants, initial encounter: Secondary | ICD-10-CM | POA: Diagnosis not present

## 2020-09-03 DIAGNOSIS — R5381 Other malaise: Secondary | ICD-10-CM | POA: Diagnosis not present

## 2020-09-03 DIAGNOSIS — Z743 Need for continuous supervision: Secondary | ICD-10-CM | POA: Diagnosis not present

## 2020-09-03 DIAGNOSIS — Z87891 Personal history of nicotine dependence: Secondary | ICD-10-CM | POA: Insufficient documentation

## 2020-09-03 NOTE — ED Provider Notes (Signed)
[facility] EMERGENCY DEPT Provider Note   CSN: [id number] Arrival date & time: 09/03/20  0757     History Chief Complaint  Patient presents with  . urinary catheter    [name] [name]. is a 75 y.o. male.  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  Patient was seen at [facility] yesterday and at that time was having intermittent leakage around the catheter.  At that time the catheter was attempted to be removed but there was significant resistance and it was left in place.  There was no sign of patient having a UTI at that time and he was discharged home.  Patient reports at 4 AM this morning the catheter popped out and he is now here to get the catheter replaced.  He has no complaints of any abdominal pain, fever or other complaints.  Prior to discharge from [facility] yesterday his catheter was flowing and he was not having any evidence of urinary retention.  He reports in total he has had a suprapubic catheter for approximately a year.  The history is provided by the patient.       Past Medical History:  Diagnosis Date  . Anemia 10/02/2019   DUE TO BLOOD LOSS  . Blind   . Disorder of bone and cartilage, unspecified   . Edema   . Elevated prostate specific antigen (PSA)   . Hypertension   . Hypopotassemia   . Nonspecific abnormal results of liver function study   . Unspecified glaucoma(365.9)     Patient Active Problem List   Diagnosis Date Noted  . Swelling of lower leg 10/02/2019  . 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 Thrombocytosis 09/13/2019  . Urinary retention 09/13/2019  . AKI (acute kidney injury) ([place]) 05/25/2019  . BPH (benign prostatic hyperplasia) 05/25/2019  . Anemia due to blood loss, chronic   . Acute blood loss anemia 01/14/2018  . Hypokalemia 01/14/2018  . Legally blind  01/14/2018  . Hematuria 12/23/2012  . Chest pain 12/23/2012  . Anemia due to blood loss, acute 12/23/2012  . Elevated prostate specific antigen (PSA)   . Hypertension   . Edema   . Severe stage glaucoma 08/14/2011  . 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 CYSTOSCOPY WITH DIRECT VISION INTERNAL URETHROTOMY N/A 10/02/2019   Procedure: CYSTOSCOPY WITH DIRECT VISION INTERNAL URETHROTOMY WITH CLOT EVACUATION retrograd urethralgram Coverted to Open.;  Surgeon: Lucas Mallow, MD;  Location[place]  Service: Urology;  Laterality: N/A;  . CYSTOSCOPY WITH FULGERATION N/A 01/17/2018   Procedure: CYSTOSCOPY CLOT EVACUATION WITH FULGERATION TUR BIOPSY OF BLADDER NECK;  Surgeon: Irine Seal, MD;  Location: [facility];  Service: Urology;  Laterality: N/A;  . CYSTOSTOMY N/A 10/02/2019   Procedure: Open Cystostomy Suprapubic tube placement and JP drain and urethral dilation;  Surgeon: Lucas Mallow, MD;  Location: [place];  Service: Urology;  Laterality: N/A;  . EYE SURGERY Bilateral 2009   for glaucoma  . EYE SURGERY Left 2014  . HERNIA REPAIR Right 2003   Dr. Jeraldine Loots  . IR ANGIOGRAM EXTREMITY RIGHT  10/03/2019  . IR ANGIOGRAM PELVIS SELECTIVE OR SUPRASELECTIVE  10/03/2019  . IR ANGIOGRAM PELVIS SELECTIVE OR SUPRASELECTIVE  10/03/2019  . IR ANGIOGRAM SELECTIVE  EACH ADDITIONAL VESSEL  10/03/2019  . IR ANGIOGRAM SELECTIVE EACH ADDITIONAL VESSEL  10/03/2019  . IR ANGIOGRAM SELECTIVE EACH ADDITIONAL VESSEL  10/03/2019  . IR EMBO ART  VEN HEMORR LYMPH EXTRAV  INC GUIDE ROADMAPPING  10/03/2019  . IR US GUIDE VASC ACCESS RIGHT  10/03/2019  . LAPAROSCOPIC APPENDECTOMY N/A 12/24/2012   Procedure:  LAPAROSCOPIC APPENDECTOMY ;  Surgeon: Pedro Earls, MD;  Location: [facility];  Service: General;  Laterality: N/A;  . TRANSURETHRAL RESECTION OF PROSTATE N/A 05/27/2019    Procedure: Cystoscopy Clot Evacuation and Fulgration;  Surgeon: Ardis Hughs, MD;  Location: [facility];  Service: Urology;  Laterality: N/A;       Family History  Problem Relation Age of Onset  . Kidney failure Mother     Social History   Tobacco Use  . Smoking status: Former Smoker    Types: Cigarettes    Quit date: 06/05/1968    Years since quitting: 52.2  . Smokeless tobacco: Never Used  Vaping Use  . Vaping Use: Never used  Substance Use Topics  . Alcohol use: No    Comment: STOPPED IN 1970S  . Drug use: No    Home Medications Prior to Admission medications   Medication Sig Start Date End Date Taking? Authorizing Provider  Ascorbic Acid (VITAMIN C WITH ROSE HIPS) 1000 MG tablet Take 2,000 mg by mouth daily with lunch.     [provider]  aspirin-acetaminophen-caffeine (EXCEDRIN MIGRAINE) [phone] MG tablet Take 1 tablet by mouth daily as needed (pain).     [provider]  B Complex Vitamins (VITAMIN-B COMPLEX PO) Take 1 tablet by mouth daily.    [provider]  brimonidine (ALPHAGAN) 0.2 % ophthalmic solution Place 1 drop into both eyes 2 (two) times daily. 03/11/19   [provider]  dorzolamide-timolol (COSOPT) 22.3-6.8 MG/ML ophthalmic solution APPOINTMENT OVERDUE, instill 1 drop in both eyes twice daily 07/14/13   [name], NP  finasteride (PROSCAR) 5 MG tablet Take 5 mg by mouth daily. 03/10/20   [provider]  folic acid (FOLVITE) 570 MCG tablet Take 800 mcg by mouth 2 (two) times a week. Twice a week    [provider]  hydrochlorothiazide (MICROZIDE) 12.5 MG capsule Take one capsule by mouth once daily. Needs an appointment before anymore future refills. 07/20/20   [name], NP  Magnesium 250 MG TABS Take 250 mg by mouth daily as needed (migraine headache).     [provider]  pilocarpine (PILOCAR) 2 % ophthalmic solution Place 1 drop into the right eye every 6 (six) hours.   12/25/17   [provider]  potassium chloride SA (KLOR-CON) 20 MEQ tablet TAKE 1 TABLET(20 MEQ) BY MOUTH DAILY Patient taking differently: Take 20 mEq by mouth daily. 12/25/19   [name], NP  pyridOXINE (VITAMIN B-6) 50 MG tablet Take 50 mg by mouth daily with lunch.     [provider]  vitamin B-12 (CYANOCOBALAMIN) 1000 MCG tablet Take 1,000 mcg by mouth daily with lunch.    [provider]    Allergies    Patient has no known allergies.  Review of Systems   Review of Systems  All other systems reviewed and are negative.   Physical Exam Updated Vital Signs BP (!) 150/85 (BP Location: Right Arm)   Pulse 89   Temp 98.6 F (37 C) (Oral)   Resp 16   Ht 5\' 8"  (1.727 m)   Wt 80.6  kg   SpO2 100%   BMI 27.02 kg/m   Physical Exam Vitals and nursing note reviewed.  Constitutional:      General: He is not in acute distress.    Appearance: Normal appearance.  HENT:     Head: Normocephalic.     Mouth/Throat:     Mouth: Mucous membranes are moist.  Eyes:     Comments: Mild opacification of cornea bilaterally  Cardiovascular:     Rate and Rhythm: Normal rate.  Pulmonary:     Effort: Pulmonary effort is normal.  Abdominal:     General: Abdomen is flat.     Palpations: Abdomen is soft.     Tenderness: There is no abdominal tenderness. There is no guarding or rebound.     Comments: Suprapubic cath site with minimal purulent drainage in the lower suprapubic abd  Skin:    General: Skin is warm and dry.  Neurological:     Mental Status: He is alert. Mental status is at baseline.  Psychiatric:        Mood and Affect: Mood normal.        Behavior: Behavior normal.     ED Results / Procedures / Treatments   Labs (all labs ordered are listed, but only abnormal results are displayed) Labs Reviewed - No data to display  EKG None  Radiology No results found.  Procedures Procedures   Medications Ordered in ED Medications - No data to  display  ED Course  I have reviewed the triage vital signs and the nursing notes.  Pertinent labs & imaging results that were available during my care of the patient were reviewed by me and considered in my medical decision making (see chart for details).    MDM Rules/Calculators/A&P                          Patient presenting today because his suprapubic catheter fell out.  He was seen here yesterday because of intermittent leakage from the catheter.  Patient is requesting that a 20 to be placed because it causes less leakage from the catheter.  His catheter has been out approximately 4 hours.  Patient has no other complaints at this time.  Yesterday he was evaluated with blood work and everything was within normal limits.  27 French catheter was placed with out complication.  Urine flowing and patient stable for discharge.  Final Clinical Impression(s) / ED Diagnoses Final diagnoses:  Suprapubic catheter dysfunction, initial encounter [facility])    Rx / DC Orders ED Discharge Orders    None       Blanchie Dessert, MD 09/03/20 [phone]

## 2020-09-03 NOTE — ED Triage Notes (Signed)
Pt reports suprapubic catheter fell out at 0400 this am

## 2020-09-03 NOTE — ED Notes (Signed)
Patient came stating that his suprapubic cath came out at 0415 this morning.   Dr. Maryan Rued was able to replace the suprapubic to Fr22 without difficulty.  Patient denies any pain.

## 2020-09-04 LAB — URINE CULTURE

## 2020-09-18 ENCOUNTER — Other Ambulatory Visit: Payer: Self-pay | Admitting: Nurse Practitioner

## 2020-09-30 ENCOUNTER — Other Ambulatory Visit: Payer: Self-pay | Admitting: Nurse Practitioner

## 2020-10-07 ENCOUNTER — Other Ambulatory Visit: Payer: Self-pay

## 2020-10-07 ENCOUNTER — Emergency Department (HOSPITAL_BASED_OUTPATIENT_CLINIC_OR_DEPARTMENT_OTHER)
Admission: EM | Admit: 2020-10-07 | Discharge: 2020-10-07 | Disposition: A | Discharge status: Home / Self Care | Payer: Medicare Other | Attending: Emergency Medicine | Admitting: Emergency Medicine

## 2020-10-07 ENCOUNTER — Encounter (HOSPITAL_BASED_OUTPATIENT_CLINIC_OR_DEPARTMENT_OTHER): Payer: Self-pay | Admitting: Emergency Medicine

## 2020-10-07 DIAGNOSIS — Y846 Urinary catheterization as the cause of abnormal reaction of the patient, or of later complication, without mention of misadventure at the time of the procedure: Secondary | ICD-10-CM | POA: Insufficient documentation

## 2020-10-07 DIAGNOSIS — Z79899 Other long term (current) drug therapy: Secondary | ICD-10-CM | POA: Insufficient documentation

## 2020-10-07 DIAGNOSIS — T83098A Other mechanical complication of other indwelling urethral catheter, initial encounter: Secondary | ICD-10-CM | POA: Diagnosis not present

## 2020-10-07 DIAGNOSIS — I10 Essential (primary) hypertension: Secondary | ICD-10-CM | POA: Diagnosis not present

## 2020-10-07 DIAGNOSIS — Z743 Need for continuous supervision: Secondary | ICD-10-CM | POA: Diagnosis not present

## 2020-10-07 DIAGNOSIS — Z87891 Personal history of nicotine dependence: Secondary | ICD-10-CM | POA: Insufficient documentation

## 2020-10-07 DIAGNOSIS — R69 Illness, unspecified: Secondary | ICD-10-CM | POA: Diagnosis not present

## 2020-10-07 DIAGNOSIS — T83011A Breakdown (mechanical) of indwelling urethral catheter, initial encounter: Secondary | ICD-10-CM

## 2020-10-07 NOTE — ED Triage Notes (Signed)
Patient reports to the ER for foley cath issues. Patient's catheter is reportedly clogged. Catheter has been in place x2 weeks. Patient reports he has been to urology x2 weeks ago.

## 2020-10-07 NOTE — ED Provider Notes (Signed)
[facility] EMERGENCY DEPT Provider Note   CSN: [id number] Arrival date & time: 10/07/20  1649     History Chief Complaint  Patient presents with  . Urinary catheter issues    [name] [name]. is a 75 y.o. male with suprapubic Foley catheter present ED with concern for Foley clogged.  He reports he had this issue about 2 weeks ago and needed his Foley to be replaced.  He has a 22 French suprapubic Foley.  It clogged up again last night.  He feels like is having bladder distention and pain.  He is able to flush fluid in it but nothing is coming out.  He says it is probably because her sediment stuck in the end.  He is not on blood thinners.  He does not have a urologist.  HPI     Past Medical History:  Diagnosis Date  . Anemia 10/02/2019   DUE TO BLOOD LOSS  . Blind   . Disorder of bone and cartilage, unspecified   . Edema   . Elevated prostate specific antigen (PSA)   . Hypertension   . Hypopotassemia   . Nonspecific abnormal results of liver function study   . Unspecified glaucoma(365.9)     Patient Active Problem List   Diagnosis Date Noted  . Swelling of lower leg 10/02/2019  . 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 Thrombocytosis 09/13/2019  . Urinary retention 09/13/2019  . AKI (acute kidney injury) ([place]) 05/25/2019  . BPH (benign prostatic hyperplasia) 05/25/2019  . Anemia due to blood loss, chronic   . Acute blood loss anemia 01/14/2018  . Hypokalemia 01/14/2018  . 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 Hematuria 12/23/2012  . Chest pain 12/23/2012  . Anemia due to blood loss, acute 12/23/2012  . Elevated prostate specific antigen (PSA)   . Hypertension   . Edema   . Severe stage glaucoma 08/14/2011  . 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 CYSTOSCOPY WITH DIRECT VISION INTERNAL URETHROTOMY N/A 10/02/2019    Procedure: CYSTOSCOPY WITH DIRECT VISION INTERNAL URETHROTOMY WITH CLOT EVACUATION retrograd urethralgram Coverted to Open.;  Surgeon: Lucas Mallow, MD;  Location: [place];  Service: Urology;  Laterality: N/A;  . CYSTOSCOPY WITH FULGERATION N/A 01/17/2018   Procedure: CYSTOSCOPY CLOT EVACUATION WITH FULGERATION TUR BIOPSY OF BLADDER NECK;  Surgeon: Irine Seal, MD;  [facility];  Service: Urology;  Laterality: N/A;  . CYSTOSTOMY N/A 10/02/2019   Procedure: Open Cystostomy Suprapubic tube placement and JP drain and urethral dilation;  Surgeon: Lucas Mallow, MD;  Location: [place];  Service: Urology;  Laterality: N/A;  . EYE SURGERY Bilateral 2009   for glaucoma  . EYE SURGERY Left 2014  . HERNIA REPAIR Right 2003   Dr. Jeraldine Loots  . IR ANGIOGRAM EXTREMITY RIGHT  10/03/2019  . IR ANGIOGRAM PELVIS SELECTIVE OR SUPRASELECTIVE  10/03/2019  . IR ANGIOGRAM PELVIS SELECTIVE OR SUPRASELECTIVE  10/03/2019  . IR ANGIOGRAM SELECTIVE EACH ADDITIONAL VESSEL  10/03/2019  . IR ANGIOGRAM SELECTIVE EACH ADDITIONAL VESSEL  10/03/2019  . IR ANGIOGRAM SELECTIVE EACH ADDITIONAL VESSEL  10/03/2019  . IR EMBO ART  VEN HEMORR LYMPH EXTRAV  INC GUIDE ROADMAPPING  10/03/2019  . IR US GUIDE VASC ACCESS RIGHT  10/03/2019  . LAPAROSCOPIC APPENDECTOMY N/A 12/24/2012   Procedure:  LAPAROSCOPIC APPENDECTOMY ;  Surgeon: Pedro Earls, MD;  Location: [facility];  Service: General;  Laterality: N/A;  . TRANSURETHRAL RESECTION OF PROSTATE N/A 05/27/2019   Procedure: Cystoscopy Clot Evacuation and Fulgration;  Surgeon: Ardis Hughs, MD;  Location[facility];  Service: Urology;  Laterality: N/A;       Family History  Problem Relation Age of Onset  . Kidney failure Mother     Social History   Tobacco Use  . Smoking status: Former Smoker    Types: Cigarettes    Quit date: 06/05/1968    Years since quitting: 52.3  . Smokeless tobacco: Never Used  Vaping Use  . Vaping Use: Never used  Substance Use Topics  .  Alcohol use: No    Comment: STOPPED IN 1970S  . Drug use: No    Home Medications Prior to Admission medications   Medication Sig Start Date End Date Taking? Authorizing Provider  Ascorbic Acid (VITAMIN C WITH ROSE HIPS) 1000 MG tablet Take 2,000 mg by mouth daily with lunch.     [provider]  aspirin-acetaminophen-caffeine (EXCEDRIN MIGRAINE) [phone] MG tablet Take 1 tablet by mouth daily as needed (pain).     [provider]  B Complex Vitamins (VITAMIN-B COMPLEX PO) Take 1 tablet by mouth daily.    [provider]  brimonidine (ALPHAGAN) 0.2 % ophthalmic solution Place 1 drop into both eyes 2 (two) times daily. 03/11/19   [provider]  dorzolamide-timolol (COSOPT) 22.3-6.8 MG/ML ophthalmic solution APPOINTMENT OVERDUE, instill 1 drop in both eyes twice daily 07/14/13   [name], NP  finasteride (PROSCAR) 5 MG tablet Take 5 mg by mouth daily. 03/10/20   [provider]  folic acid (FOLVITE) 631 MCG tablet Take 800 mcg by mouth 2 (two) times a week. Twice a week    [provider]  hydrochlorothiazide (MICROZIDE) 12.5 MG capsule Take one capsule by mouth once daily. Needs an appointment before anymore future refills. 07/20/20   [name], NP  Magnesium 250 MG TABS Take 250 mg by mouth daily as needed (migraine headache).     [provider]  pilocarpine (PILOCAR) 2 % ophthalmic solution Place 1 drop into the right eye every 6 (six) hours.  12/25/17   [provider]  potassium chloride SA (KLOR-CON) 20 MEQ tablet TAKE 1 TABLET(20 MEQ) BY MOUTH DAILY 09/20/20   [name], NP  pyridOXINE (VITAMIN B-6) 50 MG tablet Take 50 mg by mouth daily with lunch.     [provider]  vitamin B-12 (CYANOCOBALAMIN) 1000 MCG tablet Take 1,000 mcg by mouth daily with lunch.    [provider]    Allergies    Patient has no known allergies.  Review of Systems   Review of Systems   Constitutional: Negative for chills and fever.  Respiratory: Negative for cough and shortness of breath.   Cardiovascular: Negative for chest pain and palpitations.  Gastrointestinal: Positive for abdominal pain and nausea. Negative for vomiting.  Genitourinary: Positive for difficulty urinating. Negative for dysuria.  Musculoskeletal: Negative for arthralgias and back pain.  Skin: Negative for color change and rash.  Neurological: Negative for syncope and light-headedness.  All other systems reviewed and are negative.   Physical Exam Updated Vital Signs BP (!) 167/106   Pulse 99   Temp 99 F (37.2 C) (Oral)   Resp 18   Ht 5\' 8"  (1.727 m)   SpO2 98%   BMI 27.02 kg/m   Physical Exam Constitutional:      General: He is  not in acute distress. HENT:     Head: Normocephalic and atraumatic.  Eyes:     Conjunctiva/sclera: Conjunctivae normal.     Pupils: Pupils are equal, round, and reactive to light.  Cardiovascular:     Rate and Rhythm: Normal rate and regular rhythm.  Pulmonary:     Effort: Pulmonary effort is normal. No respiratory distress.  Abdominal:     General: There is no distension.     Tenderness: There is no abdominal tenderness.     Comments: Suprapubic foley catheter, suprapubic fullness  Skin:    General: Skin is warm and dry.  Neurological:     General: No focal deficit present.     Mental Status: He is alert. Mental status is at baseline.  Psychiatric:        Mood and Affect: Mood normal.        Behavior: Behavior normal.     ED Results / Procedures / Treatments   Labs (all labs ordered are listed, but only abnormal results are displayed) Labs Reviewed - No data to display  EKG None  Radiology No results found.  Procedures Procedures   Medications Ordered in ED Medications - No data to display  ED Course  I have reviewed the triage vital signs and the nursing notes.  Pertinent labs & imaging results that were available during my care  of the patient were reviewed by me and considered in my medical decision making (see chart for details).  We were able to flush but unable to drain a suprapubic Foley.  Does not appear to be any obvious blood here.  I performed a bedside ultrasound which did show the Foley balloon in place.  We exchanged the suprapubic catheter with another 21 [place] and injected this balloon with 20 cc of sterile saline.  We were then easily able to flush and drain clear yellow urine.  I suspect there may be a lot of sediment in the bladder, as there was some clogging of prior Foley.  I will give him urology follow-up.  Clinical Course as of 10/07/20 2314  Thu Oct 07, 2020  1901 Foley placed with good return of urine.  Prior Foley showed that he had some sediment clogged at the end of it.  He does appear to a lot of sediment in his bladder.  We irrigated this with about 1 L of sterile saline and allowed to cell to drain.  He does not currently have a urologist and provide him follow-up. [MT]    Clinical Course User Index [MT] Dan Scearce, Carola Rhine, MD    Final Clinical Impression(s) / ED Diagnoses Final diagnoses:  Malfunction of Foley catheter, initial encounter [facility])    Rx / DC Orders ED Discharge Orders    None       Langston Masker Carola Rhine, MD 10/07/20 2314

## 2020-10-07 NOTE — Discharge Instructions (Addendum)
Please call the urologist office to schedule an appointment with them moving forward for your Foley issues.

## 2020-10-12 ENCOUNTER — Other Ambulatory Visit: Payer: Self-pay | Admitting: Nurse Practitioner

## 2020-10-18 ENCOUNTER — Encounter (HOSPITAL_BASED_OUTPATIENT_CLINIC_OR_DEPARTMENT_OTHER): Payer: Self-pay | Admitting: Emergency Medicine

## 2020-10-18 ENCOUNTER — Other Ambulatory Visit: Payer: Self-pay

## 2020-10-18 ENCOUNTER — Emergency Department (HOSPITAL_BASED_OUTPATIENT_CLINIC_OR_DEPARTMENT_OTHER)
Admission: EM | Admit: 2020-10-18 | Discharge: 2020-10-18 | Disposition: A | Discharge status: Home / Self Care | Payer: Medicare Other | Attending: Emergency Medicine | Admitting: Emergency Medicine

## 2020-10-18 DIAGNOSIS — Z87891 Personal history of nicotine dependence: Secondary | ICD-10-CM | POA: Insufficient documentation

## 2020-10-18 DIAGNOSIS — I10 Essential (primary) hypertension: Secondary | ICD-10-CM | POA: Insufficient documentation

## 2020-10-18 DIAGNOSIS — Z7982 Long term (current) use of aspirin: Secondary | ICD-10-CM | POA: Insufficient documentation

## 2020-10-18 DIAGNOSIS — R609 Edema, unspecified: Secondary | ICD-10-CM | POA: Diagnosis not present

## 2020-10-18 DIAGNOSIS — Z79899 Other long term (current) drug therapy: Secondary | ICD-10-CM | POA: Insufficient documentation

## 2020-10-18 DIAGNOSIS — Y733 Surgical instruments, materials and gastroenterology and urology devices (including sutures) associated with adverse incidents: Secondary | ICD-10-CM | POA: Diagnosis not present

## 2020-10-18 DIAGNOSIS — T83098A Other mechanical complication of other indwelling urethral catheter, initial encounter: Secondary | ICD-10-CM | POA: Diagnosis not present

## 2020-10-18 DIAGNOSIS — Z743 Need for continuous supervision: Secondary | ICD-10-CM | POA: Diagnosis not present

## 2020-10-18 DIAGNOSIS — T83011A Breakdown (mechanical) of indwelling urethral catheter, initial encounter: Secondary | ICD-10-CM

## 2020-10-18 NOTE — ED Notes (Signed)
Patient verbalizes understanding of discharge instructions. Opportunity for questioning and answers were provided. Armband removed by staff, pt discharged from ED.  

## 2020-10-18 NOTE — ED Provider Notes (Signed)
[facility] EMERGENCY DEPT Provider Note   CSN: [id number] Arrival date & time: 10/18/20  1254     History CC:  Foley problems  [name] Udell. is a 75 y.o. male w/ hx of suprapubic foley catheter here with foley clogged.  His foley insertion site is "leaking," which he states happens when his catheter gets clogged.  This is a recurring issue.  His foley was last replaced by myself in the ED on 10/07/20.  He has not followed up or established care with urology as advised at that time.  It was a 22 French suprapubic foley.   HPI     Past Medical History:  Diagnosis Date  . Anemia 10/02/2019   DUE TO BLOOD LOSS  . Blind   . Disorder of bone and cartilage, unspecified   . Edema   . Elevated prostate specific antigen (PSA)   . Hypertension   . Hypopotassemia   . Nonspecific abnormal results of liver function study   . Unspecified glaucoma(365.9)     Patient Active Problem List   Diagnosis Date Noted  . Swelling of lower leg 10/02/2019  . 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 Thrombocytosis 09/13/2019  . Urinary retention 09/13/2019  . AKI (acute kidney injury) ([place]) 05/25/2019  . BPH (benign prostatic hyperplasia) 05/25/2019  . Anemia due to blood loss, chronic   . Acute blood loss anemia 01/14/2018  . Hypokalemia 01/14/2018  . 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 Hematuria 12/23/2012  . Chest pain 12/23/2012  . Anemia due to blood loss, acute 12/23/2012  . Elevated prostate specific antigen (PSA)   . Hypertension   . Edema   . Severe stage glaucoma 08/14/2011  . 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 CYSTOSCOPY WITH DIRECT VISION INTERNAL URETHROTOMY N/A 10/02/2019   Procedure: CYSTOSCOPY WITH DIRECT VISION INTERNAL URETHROTOMY WITH CLOT EVACUATION retrograd urethralgram Coverted to Open.;  Surgeon: Lucas Mallow, MD;  Location: [facility];  Service: Urology;  Laterality: N/A;  . CYSTOSCOPY WITH FULGERATION N/A 01/17/2018   Procedure: CYSTOSCOPY CLOT EVACUATION WITH FULGERATION TUR BIOPSY OF BLADDER NECK;  Surgeon: Irine Seal, MD;  Location: [facility];  Service: Urology;  Laterality: N/A;  . CYSTOSTOMY N/A 10/02/2019   Procedure: Open Cystostomy Suprapubic tube placement and JP drain and urethral dilation;  Surgeon: Lucas Mallow, MD;  Location: [facility];  Service: Urology;  Laterality: N/A;  . EYE SURGERY Bilateral 2009   for glaucoma  . EYE SURGERY Left 2014  . HERNIA REPAIR Right 2003   Dr. Jeraldine Loots  . IR ANGIOGRAM EXTREMITY RIGHT  10/03/2019  . IR ANGIOGRAM PELVIS SELECTIVE OR SUPRASELECTIVE  10/03/2019  . IR ANGIOGRAM PELVIS SELECTIVE OR SUPRASELECTIVE  10/03/2019  . IR ANGIOGRAM SELECTIVE EACH ADDITIONAL VESSEL  10/03/2019  . IR ANGIOGRAM SELECTIVE EACH ADDITIONAL VESSEL  10/03/2019  . IR ANGIOGRAM SELECTIVE EACH ADDITIONAL VESSEL  10/03/2019  . IR EMBO ART  VEN HEMORR LYMPH EXTRAV  INC GUIDE ROADMAPPING  10/03/2019  . IR US GUIDE VASC ACCESS RIGHT  10/03/2019  . LAPAROSCOPIC APPENDECTOMY N/A 12/24/2012   Procedure:  LAPAROSCOPIC APPENDECTOMY ;  Surgeon: Pedro Earls, MD;  Location: [facility];  Service: General;  Laterality: N/A;  . TRANSURETHRAL RESECTION OF PROSTATE N/A 05/27/2019   Procedure: Cystoscopy Clot Evacuation and Fulgration;  Surgeon: Ardis Hughs, MD;  Location: [facility];  Service: Urology;  Laterality: N/A;       Family History  Problem Relation Age of Onset  . Kidney failure Mother     Social History   Tobacco Use  . Smoking status: Former Smoker    Types: Cigarettes    Quit date: 06/05/1968    Years since quitting: 52.4  . Smokeless tobacco: Never Used  Vaping Use  . Vaping Use: Never used  Substance Use Topics  . Alcohol use: No    Comment: STOPPED IN 1970S  . Drug use: No    Home Medications Prior to Admission medications   Medication Sig Start Date End  Date Taking? Authorizing Provider  Ascorbic Acid (VITAMIN C WITH ROSE HIPS) 1000 MG tablet Take 2,000 mg by mouth daily with lunch.     [provider]  aspirin-acetaminophen-caffeine (EXCEDRIN MIGRAINE) [phone] MG tablet Take 1 tablet by mouth daily as needed (pain).     [provider]  B Complex Vitamins (VITAMIN-B COMPLEX PO) Take 1 tablet by mouth daily.    [provider]  brimonidine (ALPHAGAN) 0.2 % ophthalmic solution Place 1 drop into both eyes 2 (two) times daily. 03/11/19   [provider]  dorzolamide-timolol (COSOPT) 22.3-6.8 MG/ML ophthalmic solution APPOINTMENT OVERDUE, instill 1 drop in both eyes twice daily 07/14/13   [name], NP  finasteride (PROSCAR) 5 MG tablet Take 5 mg by mouth daily. 03/10/20   [provider]  folic acid (FOLVITE) 355 MCG tablet Take 800 mcg by mouth 2 (two) times a week. Twice a week    [provider]  hydrochlorothiazide (MICROZIDE) 12.5 MG capsule Take one capsule by mouth once daily. Needs an appointment before anymore future refills. 07/20/20   [name], NP  Magnesium 250 MG TABS Take 250 mg by mouth daily as needed (migraine headache).     [provider]  pilocarpine (PILOCAR) 2 % ophthalmic solution Place 1 drop into the right eye every 6 (six) hours.  12/25/17   [provider]  potassium chloride SA (KLOR-CON) 20 MEQ tablet TAKE 1 TABLET(20 MEQ) BY MOUTH DAILY 09/20/20   [name], NP  pyridOXINE (VITAMIN B-6) 50 MG tablet Take 50 mg by mouth daily with lunch.     [provider]  vitamin B-12 (CYANOCOBALAMIN) 1000 MCG tablet Take 1,000 mcg by mouth daily with lunch.    [provider]    Allergies    Patient has no known allergies.  Review of Systems   Review of Systems  Constitutional: Negative for chills and fever.  Respiratory: Negative for cough and shortness of breath.   Cardiovascular: Negative for chest pain and  palpitations.  Gastrointestinal: Negative for abdominal pain and vomiting.  Skin: Negative for color change and rash.  All other systems reviewed and are negative.   Physical Exam Updated Vital Signs BP (!) 153/93 (BP Location: Right Arm)   Pulse 67   Temp 98 F (36.7 C) (Oral)   Resp 16   Ht 5\' 8"  (1.727 m)   Wt 86.2 kg   SpO2 100%   BMI 28.89 kg/m   Physical Exam Constitutional:      General: He is not in acute distress. HENT:     Head: Normocephalic and atraumatic.  Eyes:     Conjunctiva/sclera: Conjunctivae normal.     Pupils: Pupils are equal, round, and reactive to light.  Cardiovascular:     Rate and Rhythm: Normal rate and regular rhythm.  Pulmonary:  Effort: Pulmonary effort is normal. No respiratory distress.  Abdominal:     General: There is no distension.     Tenderness: There is no abdominal tenderness.     Comments: Suprapubic foley with minimal urine output  Skin:    General: Skin is warm and dry.  Neurological:     General: No focal deficit present.     Mental Status: He is alert. Mental status is at baseline.     ED Results / Procedures / Treatments   Labs (all labs ordered are listed, but only abnormal results are displayed) Labs Reviewed - No data to display  EKG None  Radiology No results found.  Procedures Procedures   Medications Ordered in ED Medications - No data to display  ED Course  I have reviewed the triage vital signs and the nursing notes.  Pertinent labs & imaging results that were available during my care of the patient were reviewed by me and considered in my medical decision making (see chart for details).   22 french suprapubic foley catheter exchanged at bedside.  Tract is matured and new foley easily passed into bladder.  Old foley clearly had sediment from the bladder clogging it.  I reviewed with him the need for urology care - either for upsizing to a larger foley, or else for more thorough bladder  irrigation, as he has a large amount of sediment in his bladder.  Here we irrigated again with 1 liter of fluid.  He stated he would contact the urology office.     Final Clinical Impression(s) / ED Diagnoses Final diagnoses:  Malfunction of Foley catheter, initial encounter [facility])    Rx / DC Orders ED Discharge Orders    None       Rosine Solecki, Carola Rhine, MD 10/18/20 [phone]

## 2020-10-18 NOTE — ED Triage Notes (Signed)
Pt arrives via EMS with c/o suprapubic catheter leaking x 4 days. Pt denies pain

## 2020-10-18 NOTE — Discharge Instructions (Addendum)
Please call [facility] tomorrow to schedule follow-up appointment.  You continue to have issues with your Foley.  This needs the attention of a specialist.

## 2020-11-10 ENCOUNTER — Ambulatory Visit: Payer: Medicare Other | Admitting: Nurse Practitioner

## 2020-11-29 ENCOUNTER — Ambulatory Visit (INDEPENDENT_AMBULATORY_CARE_PROVIDER_SITE_OTHER): Payer: Medicare Other | Admitting: Nurse Practitioner

## 2020-11-29 ENCOUNTER — Encounter: Payer: Self-pay | Admitting: Nurse Practitioner

## 2020-11-29 ENCOUNTER — Other Ambulatory Visit: Payer: Self-pay

## 2020-11-29 VITALS — BP 160/80 | HR 77 | Temp 97.0°F | Ht 68.0 in | Wt 199.0 lb

## 2020-11-29 DIAGNOSIS — R338 Other retention of urine: Secondary | ICD-10-CM | POA: Diagnosis not present

## 2020-11-29 DIAGNOSIS — N401 Enlarged prostate with lower urinary tract symptoms: Secondary | ICD-10-CM

## 2020-11-29 DIAGNOSIS — I700 Atherosclerosis of aorta: Secondary | ICD-10-CM

## 2020-11-29 DIAGNOSIS — Z91199 Patient's noncompliance with other medical treatment and regimen due to unspecified reason: Secondary | ICD-10-CM

## 2020-11-29 DIAGNOSIS — Z9359 Other cystostomy status: Secondary | ICD-10-CM

## 2020-11-29 DIAGNOSIS — I10 Essential (primary) hypertension: Secondary | ICD-10-CM | POA: Diagnosis not present

## 2020-11-29 DIAGNOSIS — Z9119 Patient's noncompliance with other medical treatment and regimen: Secondary | ICD-10-CM

## 2020-11-29 DIAGNOSIS — D509 Iron deficiency anemia, unspecified: Secondary | ICD-10-CM | POA: Diagnosis not present

## 2020-11-29 DIAGNOSIS — B182 Chronic viral hepatitis C: Secondary | ICD-10-CM | POA: Insufficient documentation

## 2020-11-29 MED ORDER — LISINOPRIL 10 MG PO TABS: 10.0000 mg | ORAL_TABLET | Freq: Every day | ORAL | 0 refills | Status: DC

## 2020-11-29 MED ORDER — HYDROCHLOROTHIAZIDE 12.5 MG PO TABS: 12.5000 mg | ORAL_TABLET | Freq: Every day | ORAL | 1 refills | Status: DC

## 2020-11-29 MED ORDER — IRON (FERROUS SULFATE) 325 (65 FE) MG PO TABS: 325.0000 mg | ORAL_TABLET | Freq: Every day | ORAL | 1 refills | Status: DC

## 2020-11-29 NOTE — Patient Instructions (Signed)
Start lisinopril 10 mg by mouth daily to help with blood pressure  To start with ferrous sulfate 325 mg by mouth daily for anemia

## 2020-11-29 NOTE — Progress Notes (Signed)
Careteam: Patient Care Team: [name], NP as PCP - General (Geriatric Medicine)  PLACE OF SERVICE:  [place] Directive information Does Patient Have a Medical Advance Directive?: No, Would patient like information on creating a medical advance directive?: No - Patient declined  No Known Allergies  Chief Complaint  Patient presents with   Medical Management of Chronic Issues    Follow up. Patient would like to discuss medication. Discuss need for Zoster vaccine, COVID booster,pneumonia vaccine     HPI: Patient is a 75 y.o. male for routine follow up.  -missed appt in November, now following up for routine follow up.   He reports he has established with urologist but has not been in a while.  He is followed by ED when he needs anything.  He reports he has done in his research in what to do.  Reports he needs a new referral.   Reports "I do not have hepatitis C"  not planning, screened positive in 2021.   Bph- does not take proscar because it makes him have the sensation of urinating  Htn- taking hctz and potassium supplement.   Review of Systems:  Review of Systems  Constitutional:  Negative for chills, fever and weight loss.  HENT:  Negative for tinnitus.   Respiratory:  Negative for cough, sputum production and shortness of breath.   Cardiovascular:  Positive for leg swelling. Negative for chest pain and palpitations.  Gastrointestinal:  Negative for abdominal pain, constipation, diarrhea and heartburn.  Genitourinary:  Negative for dysuria, frequency and urgency.  Musculoskeletal:  Negative for back pain, falls, joint pain and myalgias.  Skin: Negative.   Neurological:  Negative for dizziness and headaches.  Psychiatric/Behavioral:  Negative for depression and memory loss. The patient does not have insomnia.    Past Medical History:  Diagnosis Date   Anemia 10/02/2019   DUE TO BLOOD LOSS   Blind    Disorder of bone and cartilage,  unspecified    Edema    Elevated prostate specific antigen (PSA)    Hypertension    Hypopotassemia    Nonspecific abnormal results of liver function study    Unspecified glaucoma(365.9)    Past Surgical History:  Procedure Laterality Date   CYSTOSCOPY WITH DIRECT VISION INTERNAL URETHROTOMY N/A 10/02/2019   Procedure: CYSTOSCOPY WITH DIRECT VISION INTERNAL URETHROTOMY WITH CLOT EVACUATION retrograd urethralgram Coverted to Open.;  Surgeon: Lucas Mallow, MD;  Location: [facility];  Service: Urology;  Laterality: N/A;   CYSTOSCOPY WITH FULGERATION N/A 01/17/2018   Procedure: CYSTOSCOPY CLOT EVACUATION WITH FULGERATION TUR BIOPSY OF BLADDER NECK;  Surgeon: Irine Seal, MD;  [facility];  Service: Urology;  Laterality: N/A;   CYSTOSTOMY N/A 10/02/2019   Procedure: Open Cystostomy Suprapubic tube placement and JP drain and urethral dilation;  Surgeon: Lucas Mallow, MD;  Location: [facility];  Service: Urology;  Laterality: N/A;   EYE SURGERY Bilateral 2009   for glaucoma   EYE SURGERY Left 2014   HERNIA REPAIR Right 2003   Dr. Jeraldine Loots   IR ANGIOGRAM EXTREMITY RIGHT  10/03/2019   IR ANGIOGRAM PELVIS SELECTIVE OR SUPRASELECTIVE  10/03/2019   IR ANGIOGRAM PELVIS SELECTIVE OR SUPRASELECTIVE  10/03/2019   IR ANGIOGRAM SELECTIVE EACH ADDITIONAL VESSEL  10/03/2019   IR ANGIOGRAM SELECTIVE EACH ADDITIONAL VESSEL  10/03/2019   IR ANGIOGRAM SELECTIVE EACH ADDITIONAL VESSEL  10/03/2019   IR EMBO ART  VEN [place] GUIDE ROADMAPPING  10/03/2019   IR US GUIDE VASC ACCESS RIGHT  10/03/2019   LAPAROSCOPIC APPENDECTOMY N/A 12/24/2012   Procedure:  LAPAROSCOPIC APPENDECTOMY ;  Surgeon: Pedro Earls, MD;  Location: [facility];  Service: General;  Laterality: N/A;   TRANSURETHRAL RESECTION OF PROSTATE N/A 05/27/2019   Procedure: Cystoscopy Clot Evacuation and Fulgration;  Surgeon: Ardis Hughs, MD;  Location: [facility];  Service: Urology;  Laterality: N/A;   Social History:   reports  that he quit smoking about 52 years ago. His smoking use included cigarettes. He has never used smokeless tobacco. He reports that he does not drink alcohol and does not use drugs.  Family History  Problem Relation Age of Onset   Kidney failure Mother     Medications: Patient's Medications  New Prescriptions   No medications on file  Previous Medications   ASCORBIC ACID (VITAMIN C WITH ROSE HIPS) 1000 MG TABLET    Take 2,000 mg by mouth daily with lunch.    ASPIRIN-ACETAMINOPHEN-CAFFEINE (EXCEDRIN MIGRAINE) 250-250-65 MG TABLET    Take 1 tablet by mouth daily as needed (pain).    B COMPLEX VITAMINS (VITAMIN-B COMPLEX PO)    Take 1 tablet by mouth daily.   BRIMONIDINE (ALPHAGAN) 0.2 % OPHTHALMIC SOLUTION    Place 1 drop into both eyes 2 (two) times daily.   DORZOLAMIDE-TIMOLOL (COSOPT) 22.3-6.8 MG/ML OPHTHALMIC SOLUTION    APPOINTMENT OVERDUE, instill 1 drop in both eyes twice daily   FINASTERIDE (PROSCAR) 5 MG TABLET    Take 5 mg by mouth daily.   FOLIC ACID (FOLVITE) 409 MCG TABLET    Take 800 mcg by mouth 2 (two) times a week. Twice a week   HYDROCHLOROTHIAZIDE (HYDRODIURIL) 12.5 MG TABLET    Take 12.5 mg by mouth daily.   MAGNESIUM 250 MG TABS    Take 250 mg by mouth daily as needed (migraine headache).    PILOCARPINE (PILOCAR) 2 % OPHTHALMIC SOLUTION    Place 1 drop into the right eye every 6 (six) hours.    POTASSIUM CHLORIDE SA (KLOR-CON) 20 MEQ TABLET    TAKE 1 TABLET(20 MEQ) BY MOUTH DAILY   PYRIDOXINE (VITAMIN B-6) 50 MG TABLET    Take 50 mg by mouth daily with lunch.    VITAMIN B-12 (CYANOCOBALAMIN) 1000 MCG TABLET    Take 1,000 mcg by mouth daily with lunch.  Modified Medications   No medications on file  Discontinued Medications   HYDROCHLOROTHIAZIDE (MICROZIDE) 12.5 MG CAPSULE    Take one capsule by mouth once daily. Needs an appointment before anymore future refills.    Physical Exam:  Vitals:   11/29/20 1142  BP: (!) 160/80  Pulse: 77  Temp: (!) 97 F (36.1 C)   SpO2: 98%  Weight: 199 lb (90.3 kg)  Height: 5\' 8"  (1.727 m)   Body mass index is 30.26 kg/m. Wt Readings from Last 3 Encounters:  10/18/20 190 lb (86.2 kg)  09/03/20 177 lb 11.1 oz (80.6 kg)  09/02/20 177 lb 11.2 oz (80.6 kg)    Physical Exam Constitutional:      General: He is not in acute distress.    Appearance: He is well-developed. He is not diaphoretic.  HENT:     Head: Normocephalic and atraumatic.     Right Ear: External ear normal.     Left Ear: External ear normal.     Mouth/Throat:     Pharynx: No oropharyngeal exudate.  Eyes:     Conjunctiva/sclera: Conjunctivae normal.  Pupils: Pupils are equal, round, and reactive to light.  Cardiovascular:     Rate and Rhythm: Normal rate and regular rhythm.     Heart sounds: Normal heart sounds.  Pulmonary:     Effort: Pulmonary effort is normal.     Breath sounds: Normal breath sounds.  Abdominal:     General: Bowel sounds are normal.     Palpations: Abdomen is soft.     Comments: Suprapubic cath in place  Musculoskeletal:        General: No tenderness.     Cervical back: Normal range of motion and neck supple.     Right lower leg: Edema present.     Left lower leg: Edema present.  Skin:    General: Skin is warm and dry.  Neurological:     Mental Status: He is alert and oriented to person, place, and time.    Labs reviewed: Basic Metabolic Panel: Recent Labs    03/14/20 1723 03/23/20 1440 09/02/20 1811  NA 140 132* 140  K 3.1* 4.6 4.0  CL 106 99 107  CO2 26 20* 20*  GLUCOSE 83 75 75  BUN 20 18 23   CREATININE 0.81 1.26* 0.98  CALCIUM 8.7* 8.9 8.6*   Liver Function Tests: Recent Labs    02/21/20 1450 09/02/20 1811  AST 32 36  ALT 28 24  ALKPHOS 82 92  BILITOT 1.0 0.9  PROT 6.9 7.0  ALBUMIN 3.5 3.7   No results for input(s): LIPASE, AMYLASE in the last 8760 hours. No results for input(s): AMMONIA in the last 8760 hours. CBC: Recent Labs    02/21/20 1450 03/23/20 1440 09/02/20 2010   WBC 5.4 7.8 4.9  NEUTROABS 3.8  --   --   HGB 9.4* 10.9* 12.9*  HCT 31.9* 36.7* 41.0  MCV 72.0* 73.1* 84.0  PLT 268 351 174   Lipid Panel: No results for input(s): CHOL, HDL, LDLCALC, TRIG, CHOLHDL, LDLDIRECT in the last 8760 hours. TSH: No results for input(s): TSH in the last 8760 hours. A1C: No results found for: HGBA1C   Assessment/Plan 1. Suprapubic catheter [facility]) -has not followed up with urology. - Ambulatory referral to Urology  2. Positive hepatitis C antibody test He reports he does not have hepatitis C even though screening was positive. Does not want additional testing. Extensive education provided today in regards that hepatitis C is generally asymptomatic and that is why we screen. He is always aware that without treatment could lead to liver failure/death.   3. Cirrhosis  -noted on imaging from hospital. Educated pt about this today.   4. Aortic Atherosclerosis  -noted on imaging. Pt declines lab work today to follow up lipids.   5. Anemia Reports this results was given in the hospital. He also states that the doctor told him "not to let anyone take his blood for at least 6 months"  -encouraged ferrous sulfate 325 mg daily   6. Hypertension He has not been on hctz and potassium supplement. Not at [place] to this.  -will add lisinopril as well.  -encouraged low sodium diet.   7. Noncompliant -has been noncompliant with follow up and medication. He also has not followed up with urologist as directed. He does not want to have blood work even though education provided (worried about worsen anemia).  Educated Mr Edelen that in order for our practice to provide him  with proper medical care, it is important that he keeps his regular scheduled appointments and that it is our  office policy that patients who have missed three (3) scheduled appointments be dismissed from our practice. He missed appt in November and earlier this month. If you are unable to keep your  NEXT scheduled appointment, please call at least 24 hours in advance to reschedule and avoid having this count as a missed appointment. Educated if he fails to keep follow up appt we will have no choice but to dismiss you from our practice.   Next appt: 1 month to recheck blood pressure and labs.  Carlos American. [place], [facility] [phone]

## 2020-11-30 ENCOUNTER — Telehealth: Payer: Self-pay | Admitting: Nurse Practitioner

## 2020-11-30 NOTE — Telephone Encounter (Signed)
Noted  

## 2020-11-30 NOTE — Telephone Encounter (Signed)
Received call from [facility] gso stating [name] [name] had been discharged from their practice & would not be able to schedule the referral that was sent over on 11/29/20.  I have sent the referral to [facility], being that's the next closet location other than [facility].  Thanks, Vilinda Blanks

## 2021-01-03 ENCOUNTER — Encounter: Payer: Self-pay | Admitting: Nurse Practitioner

## 2021-01-03 ENCOUNTER — Ambulatory Visit (INDEPENDENT_AMBULATORY_CARE_PROVIDER_SITE_OTHER): Payer: Medicare Other | Admitting: Nurse Practitioner

## 2021-01-03 ENCOUNTER — Other Ambulatory Visit: Payer: Self-pay

## 2021-01-03 VITALS — BP 130/88 | HR 83 | Temp 97.3°F | Ht 68.0 in | Wt 203.0 lb

## 2021-01-03 DIAGNOSIS — R600 Localized edema: Secondary | ICD-10-CM | POA: Diagnosis not present

## 2021-01-03 DIAGNOSIS — I10 Essential (primary) hypertension: Secondary | ICD-10-CM | POA: Diagnosis not present

## 2021-01-03 DIAGNOSIS — R338 Other retention of urine: Secondary | ICD-10-CM | POA: Diagnosis not present

## 2021-01-03 DIAGNOSIS — B182 Chronic viral hepatitis C: Secondary | ICD-10-CM | POA: Diagnosis not present

## 2021-01-03 DIAGNOSIS — Z6830 Body mass index (BMI) 30.0-30.9, adult: Secondary | ICD-10-CM

## 2021-01-03 DIAGNOSIS — Z23 Encounter for immunization: Secondary | ICD-10-CM | POA: Diagnosis not present

## 2021-01-03 DIAGNOSIS — N401 Enlarged prostate with lower urinary tract symptoms: Secondary | ICD-10-CM | POA: Diagnosis not present

## 2021-01-03 DIAGNOSIS — D509 Iron deficiency anemia, unspecified: Secondary | ICD-10-CM | POA: Diagnosis not present

## 2021-01-03 DIAGNOSIS — E6609 Other obesity due to excess calories: Secondary | ICD-10-CM

## 2021-01-03 LAB — CBC WITH DIFFERENTIAL/PLATELET
Absolute Monocytes: 610 cells/uL (ref 200–950)
Basophils Absolute: 39 cells/uL (ref 0–200)
Basophils Relative: 0.7 %
Eosinophils Absolute: 213 cells/uL (ref 15–500)
Eosinophils Relative: 3.8 %
HCT: 36.8 % — ABNORMAL LOW (ref 38.5–50.0)
Hemoglobin: 12.2 g/dL — ABNORMAL LOW (ref 13.2–17.1)
Lymphs Abs: 1142 cells/uL (ref 850–3900)
MCH: 29.3 pg (ref 27.0–33.0)
MCHC: 33.2 g/dL (ref 32.0–36.0)
MCV: 88.5 fL (ref 80.0–100.0)
MPV: 11.4 fL (ref 7.5–12.5)
Monocytes Relative: 10.9 %
Neutro Abs: 3595 cells/uL (ref 1500–7800)
Neutrophils Relative %: 64.2 %
Platelets: 204 10*3/uL (ref 140–400)
RBC: 4.16 10*6/uL — ABNORMAL LOW (ref 4.20–5.80)
RDW: 13.7 % (ref 11.0–15.0)
Total Lymphocyte: 20.4 %
WBC: 5.6 10*3/uL (ref 3.8–10.8)

## 2021-01-03 LAB — COMPLETE METABOLIC PANEL WITH GFR
AG Ratio: 1.5 (calc) (ref 1.0–2.5)
ALT: 30 U/L (ref 9–46)
AST: 47 U/L — ABNORMAL HIGH (ref 10–35)
Albumin: 4 g/dL (ref 3.6–5.1)
Alkaline phosphatase (APISO): 88 U/L (ref 35–144)
BUN/Creatinine Ratio: 29 (calc) — ABNORMAL HIGH (ref 6–22)
BUN: 29 mg/dL — ABNORMAL HIGH (ref 7–25)
CO2: 25 mmol/L (ref 20–32)
Calcium: 8.8 mg/dL (ref 8.6–10.3)
Chloride: 106 mmol/L (ref 98–110)
Creat: 1.01 mg/dL (ref 0.70–1.28)
Globulin: 2.6 g/dL (calc) (ref 1.9–3.7)
Glucose, Bld: 91 mg/dL (ref 65–139)
Potassium: 4.1 mmol/L (ref 3.5–5.3)
Sodium: 140 mmol/L (ref 135–146)
Total Bilirubin: 0.7 mg/dL (ref 0.2–1.2)
Total Protein: 6.6 g/dL (ref 6.1–8.1)
eGFR: 78 mL/min/{1.73_m2} (ref >=60)

## 2021-01-03 NOTE — Patient Instructions (Signed)
Elevate legs above level of heart when you can to help with swelling

## 2021-01-03 NOTE — Progress Notes (Signed)
Careteam: Patient Care Team: [name], NP as PCP - General (Geriatric Medicine)  PLACE OF SERVICE:  [facility] Directive information    No Known Allergies  Chief Complaint  Patient presents with   Medical Management of Chronic Issues    Patient presents today for a 4 week follow-up.   Quality Metric Gaps    Per patients care gap he is due for Shingrix, PNA and Covid vaccine.     HPI: Patient is a 75 y.o. male for blood pressure follow up.  He got hctz and lisinopril at the pharmacy- reports he took all medication around 9 am this morning (currently 1 pm).  He does not check blood pressure at home.   He reports he is also taking iron supplement.   Review of Systems: Review of Systems  Constitutional:  Negative for chills, fever and weight loss.  HENT:  Negative for tinnitus.   Respiratory:  Negative for cough, sputum production and shortness of breath.   Cardiovascular:  Positive for leg swelling. Negative for chest pain and palpitations.  Gastrointestinal:  Negative for abdominal pain, blood in stool, constipation, diarrhea and heartburn.  Genitourinary:        Supra pubic cath  Musculoskeletal:  Negative for back pain, falls, joint pain and myalgias.  Skin: Negative.   Neurological:  Negative for dizziness and headaches.  Psychiatric/Behavioral:  Negative for depression and memory loss. The patient does not have insomnia.    Past Medical History:  Diagnosis Date   Anemia 10/02/2019   DUE TO BLOOD LOSS   Blind    Disorder of bone and cartilage, unspecified    Edema    Elevated prostate specific antigen (PSA)    Hypertension    Hypopotassemia    Nonspecific abnormal results of liver function study    Unspecified glaucoma(365.9)    Past Surgical History:  Procedure Laterality Date   CYSTOSCOPY WITH DIRECT VISION INTERNAL URETHROTOMY N/A 10/02/2019   Procedure: CYSTOSCOPY WITH DIRECT VISION INTERNAL URETHROTOMY WITH CLOT EVACUATION  retrograd urethralgram Coverted to Open.;  Surgeon: Lucas Mallow, MD;  Location: [facility];  Service: Urology;  Laterality: N/A;   CYSTOSCOPY WITH FULGERATION N/A 01/17/2018   Procedure: CYSTOSCOPY CLOT EVACUATION WITH FULGERATION TUR BIOPSY OF BLADDER NECK;  Surgeon: Irine Seal, MD;  Location: [facility];  Service: Urology;  Laterality: N/A;   CYSTOSTOMY N/A 10/02/2019   Procedure: Open Cystostomy Suprapubic tube placement and JP drain and urethral dilation;  Surgeon: Lucas Mallow, MD;  Location: [facility];  Service: Urology;  Laterality: N/A;   EYE SURGERY Bilateral 2009   for glaucoma   EYE SURGERY Left 2014   HERNIA REPAIR Right 2003   Dr. Jeraldine Loots   IR ANGIOGRAM EXTREMITY RIGHT  10/03/2019   IR ANGIOGRAM PELVIS SELECTIVE OR SUPRASELECTIVE  10/03/2019   IR ANGIOGRAM PELVIS SELECTIVE OR SUPRASELECTIVE  10/03/2019   IR ANGIOGRAM SELECTIVE EACH ADDITIONAL VESSEL  10/03/2019   IR ANGIOGRAM SELECTIVE EACH ADDITIONAL VESSEL  10/03/2019   IR ANGIOGRAM SELECTIVE EACH ADDITIONAL VESSEL  10/03/2019   IR EMBO ART  VEN HEMORR LYMPH EXTRAV  INC GUIDE ROADMAPPING  10/03/2019   IR US GUIDE VASC ACCESS RIGHT  10/03/2019   LAPAROSCOPIC APPENDECTOMY N/A 12/24/2012   Procedure:  LAPAROSCOPIC APPENDECTOMY ;  Surgeon: Pedro Earls, MD;  Location: [facility];  Service: General;  Laterality: N/A;   TRANSURETHRAL RESECTION OF PROSTATE N/A 05/27/2019   Procedure: Cystoscopy Clot Evacuation and Fulgration;  Surgeon: Ardis Hughs, MD;  Location: [facility];  Service: Urology;  Laterality: N/A;   Social History:   reports that he quit smoking about 52 years ago. His smoking use included cigarettes. He has never used smokeless tobacco. He reports that he does not drink alcohol and does not use drugs.  Family History  Problem Relation Age of Onset   Kidney failure Mother     Medications: Patient's Medications  New Prescriptions   No medications on file  Previous Medications   ASCORBIC ACID (VITAMIN C WITH  ROSE HIPS) 1000 MG TABLET    Take 2,000 mg by mouth daily with lunch.    ASPIRIN-ACETAMINOPHEN-CAFFEINE (EXCEDRIN MIGRAINE) 250-250-65 MG TABLET    Take 1 tablet by mouth daily as needed (pain).    B COMPLEX VITAMINS (VITAMIN-B COMPLEX PO)    Take 1 tablet by mouth daily.   BRIMONIDINE (ALPHAGAN) 0.2 % OPHTHALMIC SOLUTION    Place 1 drop into both eyes 2 (two) times daily.   DORZOLAMIDE-TIMOLOL (COSOPT) 22.3-6.8 MG/ML OPHTHALMIC SOLUTION    APPOINTMENT OVERDUE, instill 1 drop in both eyes twice daily   FOLIC ACID (FOLVITE) 818 MCG TABLET    Take 800 mcg by mouth 2 (two) times a week. Twice a week   HYDROCHLOROTHIAZIDE (HYDRODIURIL) 12.5 MG TABLET    Take 1 tablet (12.5 mg total) by mouth daily.   IRON, FERROUS SULFATE, 325 (65 FE) MG TABS    Take 325 mg by mouth daily.   LISINOPRIL (ZESTRIL) 10 MG TABLET    Take 1 tablet (10 mg total) by mouth daily.   MAGNESIUM 250 MG TABS    Take 250 mg by mouth daily as needed (migraine headache).    PILOCARPINE (PILOCAR) 2 % OPHTHALMIC SOLUTION    Place 1 drop into the right eye every 6 (six) hours.    POTASSIUM CHLORIDE SA (KLOR-CON) 20 MEQ TABLET    TAKE 1 TABLET(20 MEQ) BY MOUTH DAILY   PYRIDOXINE (VITAMIN B-6) 50 MG TABLET    Take 50 mg by mouth daily with lunch.    VITAMIN B-12 (CYANOCOBALAMIN) 1000 MCG TABLET    Take 1,000 mcg by mouth daily with lunch.  Modified Medications   No medications on file  Discontinued Medications   No medications on file    Physical Exam:  Vitals:   01/03/21 1228 01/03/21 1301  BP: (!) 154/90 130/88  Pulse: 83   Temp: (!) 97.3 F (36.3 C)   TempSrc: Temporal   SpO2: 98%   Weight: 203 lb (92.1 kg)   Height: _0  (1.727 m)    Body mass index is 30.87 kg/m. Wt Readings from Last 3 Encounters:  01/03/21 203 lb (92.1 kg)  11/29/20 199 lb (90.3 kg)  10/18/20 190 lb (86.2 kg)    Physical Exam Constitutional:      General: He is not in acute distress.    Appearance: He is well-developed. He is not  diaphoretic.  HENT:     Head: Normocephalic and atraumatic.     Right Ear: External ear normal.     Left Ear: External ear normal.     Mouth/Throat:     Pharynx: No oropharyngeal exudate.  Eyes:     Conjunctiva/sclera: Conjunctivae normal.     Pupils: Pupils are equal, round, and reactive to light.  Cardiovascular:     Rate and Rhythm: Normal rate and regular rhythm.     Heart sounds: Normal heart sounds.  Pulmonary:     Effort: Pulmonary  effort is normal.     Breath sounds: Normal breath sounds.  Abdominal:     General: Bowel sounds are normal.     Palpations: Abdomen is soft.  Musculoskeletal:        General: No tenderness.     Cervical back: Normal range of motion and neck supple.     Right lower leg: Edema (2+) present.     Left lower leg: Edema (2+) present.  Skin:    General: Skin is warm and dry.  Neurological:     Mental Status: He is alert and oriented to person, place, and time.    Labs reviewed: Basic Metabolic Panel: Recent Labs    03/14/20 1723 03/23/20 1440 09/02/20 1811  NA 140 132* 140  K 3.1* 4.6 4.0  CL 106 99 107  CO2 26 20* 20*  GLUCOSE 83 75 75  BUN _0 CREATININE 0.81 1.26* 0.98  CALCIUM 8.7* 8.9 8.6*   Liver Function Tests: Recent Labs    02/21/20 1450 09/02/20 1811  AST 32 36  ALT 28 24  ALKPHOS 82 92  BILITOT 1.0 0.9  PROT 6.9 7.0  ALBUMIN 3.5 3.7   No results for input(s): LIPASE, AMYLASE in the last 8760 hours. No results for input(s): AMMONIA in the last 8760 hours. CBC: Recent Labs    02/21/20 1450 03/23/20 1440 09/02/20 2010  WBC 5.4 7.8 4.9  NEUTROABS 3.8  --   --   HGB 9.4* 10.9* 12.9*  HCT 31.9* 36.7* 41.0  MCV 72.0* 73.1* 84.0  PLT 268 351 174   Lipid Panel: No results for input(s): CHOL, HDL, LDLCALC, TRIG, CHOLHDL, LDLDIRECT in the last 8760 hours. TSH: No results for input(s): TSH in the last 8760 hours. A1C: No results found for: HGBA1C   Assessment/Plan 1. Iron deficiency anemia, unspecified  iron deficiency anemia type -continues on iron, no adverse effects.  - CBC with Differential/Platelet  2. Benign prostatic hyperplasia with urinary retention Continues with chronic suprapubic.  3. Primary hypertension -stable. Goal bp <140/90. Continue on hctz and lisinopril with low sodium diet. - CMP with eGFR([facility])  4. Chronic hepatitis C without hepatic coma (HCC) -pt declines treatment at this time. He has been fully educated on progression of hepatitis C without treatment.   5. Lower extremity edema -encouraged to elevate legs above level of heart as tolerates, low sodium diet, compression hose as tolerates (on in am, off in pm)  6. Class 1 obesity due to excess calories with serious comorbidity and body mass index (BMI) of 30.0 to 30.9 in adult --education provided on healthy weight loss through increase in physical activity and proper nutrition    7. Need for 23-polyvalent pneumococcal polysaccharide vaccine - Pneumococcal polysaccharide vaccine 23-valent greater than or equal to 2yo subcutaneous/IM   Next appt: 4 months.  [name] [name]. [place] [phone]

## 2021-02-15 DIAGNOSIS — H2513 Age-related nuclear cataract, bilateral: Secondary | ICD-10-CM | POA: Diagnosis not present

## 2021-02-15 DIAGNOSIS — H269 Unspecified cataract: Secondary | ICD-10-CM | POA: Diagnosis not present

## 2021-02-15 DIAGNOSIS — Z79899 Other long term (current) drug therapy: Secondary | ICD-10-CM | POA: Diagnosis not present

## 2021-02-15 DIAGNOSIS — H3581 Retinal edema: Secondary | ICD-10-CM | POA: Diagnosis not present

## 2021-02-15 DIAGNOSIS — H401133 Primary open-angle glaucoma, bilateral, severe stage: Secondary | ICD-10-CM | POA: Diagnosis not present

## 2021-03-10 ENCOUNTER — Other Ambulatory Visit: Payer: Self-pay | Admitting: Nurse Practitioner

## 2021-03-10 DIAGNOSIS — I10 Essential (primary) hypertension: Secondary | ICD-10-CM

## 2021-04-07 DIAGNOSIS — H269 Unspecified cataract: Secondary | ICD-10-CM | POA: Diagnosis not present

## 2021-04-07 DIAGNOSIS — Z79899 Other long term (current) drug therapy: Secondary | ICD-10-CM | POA: Diagnosis not present

## 2021-04-07 DIAGNOSIS — H2513 Age-related nuclear cataract, bilateral: Secondary | ICD-10-CM | POA: Diagnosis not present

## 2021-04-07 DIAGNOSIS — H35352 Cystoid macular degeneration, left eye: Secondary | ICD-10-CM | POA: Diagnosis not present

## 2021-04-07 DIAGNOSIS — H401133 Primary open-angle glaucoma, bilateral, severe stage: Secondary | ICD-10-CM | POA: Diagnosis not present

## 2021-05-09 ENCOUNTER — Encounter: Payer: Medicare Other | Admitting: Nurse Practitioner

## 2021-05-10 NOTE — Progress Notes (Signed)
err

## 2021-05-16 ENCOUNTER — Other Ambulatory Visit: Payer: Self-pay

## 2021-05-16 ENCOUNTER — Emergency Department (HOSPITAL_BASED_OUTPATIENT_CLINIC_OR_DEPARTMENT_OTHER)
Admission: EM | Admit: 2021-05-16 | Discharge: 2021-05-17 | Disposition: A | Discharge status: Home / Self Care | Payer: Medicare Other | Attending: Emergency Medicine | Admitting: Emergency Medicine

## 2021-05-16 ENCOUNTER — Encounter (HOSPITAL_BASED_OUTPATIENT_CLINIC_OR_DEPARTMENT_OTHER): Payer: Self-pay | Admitting: Emergency Medicine

## 2021-05-16 DIAGNOSIS — L02211 Cutaneous abscess of abdominal wall: Secondary | ICD-10-CM | POA: Insufficient documentation

## 2021-05-16 DIAGNOSIS — Z79899 Other long term (current) drug therapy: Secondary | ICD-10-CM | POA: Diagnosis not present

## 2021-05-16 DIAGNOSIS — Z87891 Personal history of nicotine dependence: Secondary | ICD-10-CM | POA: Insufficient documentation

## 2021-05-16 DIAGNOSIS — I10 Essential (primary) hypertension: Secondary | ICD-10-CM | POA: Diagnosis not present

## 2021-05-16 DIAGNOSIS — Z7982 Long term (current) use of aspirin: Secondary | ICD-10-CM | POA: Diagnosis not present

## 2021-05-16 DIAGNOSIS — L0291 Cutaneous abscess, unspecified: Secondary | ICD-10-CM

## 2021-05-16 MED ORDER — LISINOPRIL 10 MG PO TABS
10.0000 mg | ORAL_TABLET | Freq: Once | ORAL | Status: AC
  Administered 2021-05-16: 10 mg via ORAL
  Filled 2021-05-16: qty 1

## 2021-05-16 MED ORDER — HYDROCHLOROTHIAZIDE 25 MG PO TABS
12.5000 mg | ORAL_TABLET | Freq: Once | ORAL | Status: AC
  Administered 2021-05-16: 12.5 mg via ORAL
  Filled 2021-05-16: qty 1

## 2021-05-16 NOTE — ED Provider Notes (Signed)
[facility] DE[facility] Provider Note   CSN: [id number] Arrival date & time: 05/16/21  1833     History Chief Complaint  Patient presents with   Abdominal Pain    [name] [name]. is a 75 y.o. male.   Abdominal Pain Associated symptoms: no chest pain, no chills, no cough, no diarrhea, no fatigue, no fever, no hematuria, no nausea, no shortness of breath, no sore throat and no vomiting   Patient presents for concern of an area of swelling on the lower aspect of his abdomen.  Patient has a chronic suprapubic catheter in place.  He noticed an area of swelling to the right of this catheter.  This area had mild associated pain.  He had some Bactrim at home that was leftover from a previous infection.  He took 1 tablet of this.  He states that, since he took this antibiotic, he has had decrease in size of the swelling.  He presents to the ED due to concern of an infection and need for further antibiotics.  He denies any systemic symptoms.  He denies any other complaints or concerns at this time.    Past Medical History:  Diagnosis Date   Anemia 10/02/2019   DUE TO BLOOD LOSS   Blind    Disorder of bone and cartilage, unspecified    Edema    Elevated prostate specific antigen (PSA)    Hypertension    Hypopotassemia    Nonspecific abnormal results of liver function study    Unspecified glaucoma(365.9)     Patient Active Problem List   Diagnosis Date Noted   Chronic hepatitis C without hepatic coma ([place]) 11/29/2020   Swelling of lower leg 10/02/2019   Hypochromic microcytic anemia 09/13/2019   Thrombocytosis 09/13/2019   Urinary retention 09/13/2019   AKI (acute kidney injury) ([place]) 05/25/2019   BPH (benign prostatic hyperplasia) 05/25/2019   Anemia due to blood loss, chronic    Acute blood loss anemia 01/14/2018   Hypokalemia 01/14/2018   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   Hematuria 12/23/2012   Chest pain 12/23/2012   Anemia due to blood loss, acute  12/23/2012   Elevated prostate specific antigen (PSA)    Hypertension    Edema    Severe stage glaucoma 08/14/2011   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    Past Surgical History:  Procedure Laterality Date   CYSTOSCOPY WITH DIRECT VISION INTERNAL URETHROTOMY N/A 10/02/2019   Procedure: CYSTOSCOPY WITH DIRECT VISION INTERNAL URETHROTOMY WITH CLOT EVACUATION retrograd urethralgram Coverted to Open.;  Surgeon: Lucas Mallow, MD;  Location: [facility];  Service: Urology;  Laterality: N/A;   CYSTOSCOPY WITH FULGERATION N/A 01/17/2018   Procedure: CYSTOSCOPY CLOT EVACUATION WITH FULGERATION TUR BIOPSY OF BLADDER NECK;  Surgeon: Irine Seal, MD;  [facility];  Service: Urology;  Laterality: N/A;   CYSTOSTOMY N/A 10/02/2019   Procedure: Open Cystostomy Suprapubic tube placement and JP drain and urethral dilation;  Surgeon: Lucas Mallow, MD;  Location: [facility];  Service: Urology;  Laterality: N/A;   EYE SURGERY Bilateral 2009   for glaucoma   EYE SURGERY Left 2014   HERNIA REPAIR Right 2003   Dr. Jeraldine Loots   IR ANGIOGRAM EXTREMITY RIGHT  10/03/2019   IR ANGIOGRAM PELVIS SELECTIVE OR SUPRASELECTIVE  10/03/2019   IR ANGIOGRAM PELVIS SELECTIVE OR SUPRASELECTIVE  10/03/2019   IR ANGIOGRAM SELECTIVE EACH ADDITIONAL VESSEL  10/03/2019   IR ANGIOGRAM  SELECTIVE EACH ADDITIONAL VESSEL  10/03/2019   IR ANGIOGRAM SELECTIVE EACH ADDITIONAL VESSEL  10/03/2019   IR EMBO ART  VEN HEMORR LYMPH EXTRAV  INC GUIDE ROADMAPPING  10/03/2019   IR US GUIDE VASC ACCESS RIGHT  10/03/2019   LAPAROSCOPIC APPENDECTOMY N/A 12/24/2012   Procedure:  LAPAROSCOPIC APPENDECTOMY ;  Surgeon: Pedro Earls, MD;  Location[facility];  Service: General;  Laterality: N/A;   TRANSURETHRAL RESECTION OF PROSTATE N/A 05/27/2019   Procedure: Cystoscopy Clot Evacuation and Fulgration;  Surgeon: Ardis Hughs, MD;  Location[facility];  Service: Urology;   Laterality: N/A;       Family History  Problem Relation Age of Onset   Kidney failure Mother     Social History   Tobacco Use   Smoking status: Former    Types: Cigarettes    Quit date: 06/05/1968    Years since quitting: 52.9   Smokeless tobacco: Never  Vaping Use   Vaping Use: Never used  Substance Use Topics   Alcohol use: No    Comment: STOPPED IN 1970S   Drug use: No    Home Medications Prior to Admission medications   Medication Sig Start Date End Date Taking? Authorizing Provider  Ascorbic Acid (VITAMIN C WITH ROSE HIPS) 1000 MG tablet Take 2,000 mg by mouth daily with lunch.     [provider]  aspirin-acetaminophen-caffeine (EXCEDRIN MIGRAINE) [phone] MG tablet Take 1 tablet by mouth daily as needed (pain).     [provider]  B Complex Vitamins (VITAMIN-B COMPLEX PO) Take 1 tablet by mouth daily.    [provider]  brimonidine (ALPHAGAN) 0.2 % ophthalmic solution Place 1 drop into both eyes 2 (two) times daily. 03/11/19   [provider]  dorzolamide-timolol (COSOPT) 22.3-6.8 MG/ML ophthalmic solution APPOINTMENT OVERDUE, instill 1 drop in both eyes twice daily 07/14/13   [name], NP  folic acid (FOLVITE) 284 MCG tablet Take 800 mcg by mouth 2 (two) times a week. Twice a week    [provider]  hydrochlorothiazide (HYDRODIURIL) 12.5 MG tablet Take 1 tablet (12.5 mg total) by mouth daily. 11/29/20   [name], NP  Iron, Ferrous Sulfate, 325 (65 Fe) MG TABS Take 325 mg by mouth daily. 11/29/20   [name], NP  lisinopril (ZESTRIL) 10 MG tablet TAKE 1 TABLET(10 MG) BY MOUTH DAILY 03/10/21   [name], NP  Magnesium 250 MG TABS Take 250 mg by mouth daily as needed (migraine headache).     [provider]  pilocarpine (PILOCAR) 2 % ophthalmic solution Place 1 drop into the right eye every 6 (six) hours.  12/25/17   [provider]  potassium chloride SA (KLOR-CON) 20 MEQ  tablet TAKE 1 TABLET(20 MEQ) BY MOUTH DAILY 09/20/20   [name], NP  pyridOXINE (VITAMIN B-6) 50 MG tablet Take 50 mg by mouth daily with lunch.     [provider]  sulfamethoxazole-trimethoprim (BACTRIM DS) 800-160 MG tablet Take 1 tablet by mouth 2 (two) times daily for 5 days. 05/17/21 05/22/21  Godfrey Pick, MD  vitamin B-12 (CYANOCOBALAMIN) 1000 MCG tablet Take 1,000 mcg by mouth daily with lunch.    [provider]    Allergies    Patient has no known allergies.  Review of Systems   Review of Systems  Constitutional:  Negative for activity change, appetite change, chills, fatigue and fever.  HENT:  Negative for congestion, ear pain and sore throat.  Eyes:  Negative for pain.       Near blindness at baseline  Respiratory:  Negative for cough, chest tightness and shortness of breath.   Cardiovascular:  Negative for chest pain and palpitations.  Gastrointestinal:  Negative for abdominal pain, diarrhea, nausea and vomiting.       Focal area of swelling on right side of abdomen  Genitourinary:  Negative for flank pain and hematuria.       Chronic suprapubic catheter  Musculoskeletal:  Negative for arthralgias and back pain.  Skin:  Negative for color change and rash.  Neurological:  Negative for dizziness, seizures, syncope and headaches.  All other systems reviewed and are negative.  Physical Exam Updated Vital Signs BP (!) 172/97 (BP Location: Right Arm)   Pulse (!) 55   Temp 98.7 F (37.1 C) (Oral)   Resp 18   SpO2 99%   Physical Exam Vitals and nursing note reviewed.  Constitutional:      General: He is not in acute distress.    Appearance: He is well-developed. He is not ill-appearing, toxic-appearing or diaphoretic.  HENT:     Head: Normocephalic and atraumatic.  Eyes:     Conjunctiva/sclera: Conjunctivae normal.  Cardiovascular:     Rate and Rhythm: Normal rate and regular rhythm.     Heart sounds: No murmur heard. Pulmonary:      Effort: Pulmonary effort is normal. No respiratory distress.     Breath sounds: Normal breath sounds.  Abdominal:     Palpations: Abdomen is soft.       Comments: Small area of induration.  There is minimal associated tenderness.  Musculoskeletal:        General: No swelling.     Cervical back: Neck supple.  Skin:    General: Skin is warm and dry.     Capillary Refill: Capillary refill takes less than 2 seconds.     Coloration: Skin is not cyanotic, jaundiced or pale.  Neurological:     General: No focal deficit present.     Mental Status: He is alert and oriented to person, place, and time.  Psychiatric:        Mood and Affect: Mood normal. Mood is not anxious or depressed.        Behavior: Behavior normal.    ED Results / Procedures / Treatments   Labs (all labs ordered are listed, but only abnormal results are displayed) Labs Reviewed - No data to display  EKG None  Radiology No results found.  Procedures Procedures   Medications Ordered in ED Medications  hydrochlorothiazide (HYDRODIURIL) tablet 12.5 mg (12.5 mg Oral Given 05/16/21 2152)  lisinopril (ZESTRIL) tablet 10 mg (10 mg Oral Given 05/16/21 2152)    ED Course  I have reviewed the triage vital signs and the nursing notes.  Pertinent labs & imaging results that were available during my care of the patient were reviewed by me and considered in my medical decision making (see chart for details).    MDM Rules/Calculators/A&P                          Patient presents for an area of swelling on the surface of his lower right abdomen.  Area of swelling has reportedly decreased since he took a dose of Bactrim at home.  On arrival in the ED, he is well-appearing.  Vital signs are notable for hypertension.  Home blood pressure medications were given.  There is an  area of induration to the right of his suprapubic catheter.  There is minimal associated tenderness.  With bedside ultrasound, a small, 1 cm abscess was  identified, 1 cm below the skin surface.  Given the small size, no incision and drainage is indicated at this time.  Patient to be treated with antibiotics. Per chart review, patient has no history of CKD. Bactrim was prescribed.  Patient was advised to follow-up with his primary care doctor or to return to the ED if he does have worsening symptoms.  He was discharged in good condition.  Final Clinical Impression(s) / ED Diagnoses Final diagnoses:  Abscess    Rx / DC Orders ED Discharge Orders          Ordered    sulfamethoxazole-trimethoprim (BACTRIM DS) 800-160 MG tablet  Daily,   Status:  Discontinued        05/17/21 0023    sulfamethoxazole-trimethoprim (BACTRIM DS) 800-160 MG tablet  2 times daily        05/17/21 Lilly Cove, MD 05/18/21 [phone]

## 2021-05-16 NOTE — ED Triage Notes (Addendum)
Pt brought in by EMS for problems at his supra pubic catheter site , pt cannot remember when it was changed last ,has no  one to do it he states  no issues with urine  no redness at  site or blood in urine, states  was supposedly to be in touch with [facility] but they never called him back   Pt is visually impaired

## 2021-05-17 MED ORDER — SULFAMETHOXAZOLE-TRIMETHOPRIM 800-160 MG PO TABS: 1.0000 | ORAL_TABLET | Freq: Every day | ORAL | 0 refills | Status: DC

## 2021-05-17 MED ORDER — SULFAMETHOXAZOLE-TRIMETHOPRIM 800-160 MG PO TABS: 1.0000 | ORAL_TABLET | Freq: Two times a day (BID) | ORAL | 0 refills | Status: AC

## 2021-05-28 IMAGING — CT CT IMAGE GUIDED DRAINAGE BY PERCUTANEOUS CATHETER
1 of 2 series · 14 of 32 positions shown, 18 images · non-contrast
Comparison: None.

INDICATION: 73-year-old male with massive prostatomegaly and bladder outlet
obstruction. He has a history of chronic indwelling suprapubic
bladder catheter, however this was inadvertently displaced earlier
today. Unsuccessful attempted replacement by [HOSPITAL]the bedside.
The skin entry site appears to have healed over. He requires urgent
placement of a new suprapubic bladder catheter.

EXAM:
CT IMAGE GUIDED DRAINAGE BY PERCUTANEOUS CATHETER

[Series 2: i-spiral 5.0 b40f · axial · 0.87mm/px · z∈[+849,+989]mm · 14 of 46 slices shown, 18 images]
[im 4/46  soft-tissue]
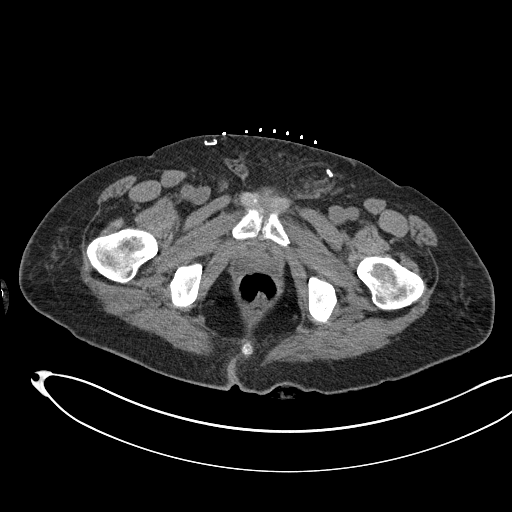
[im 4/46  bone]
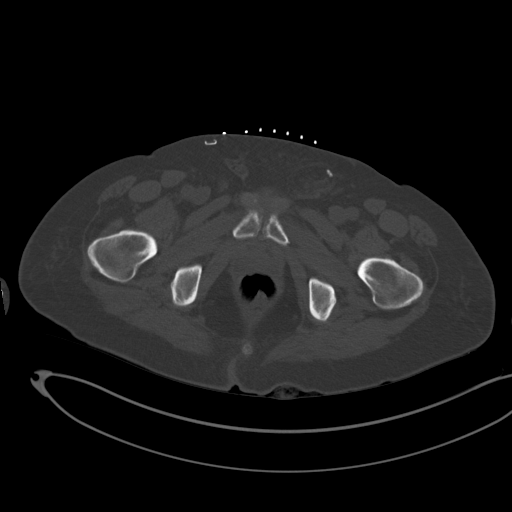
[im 7/46  soft-tissue]
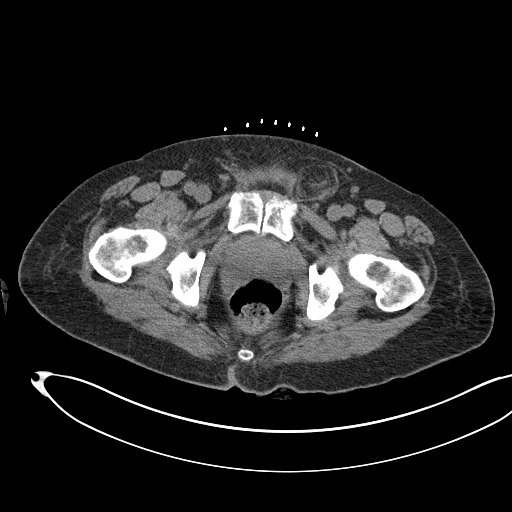
[im 11/46  soft-tissue]
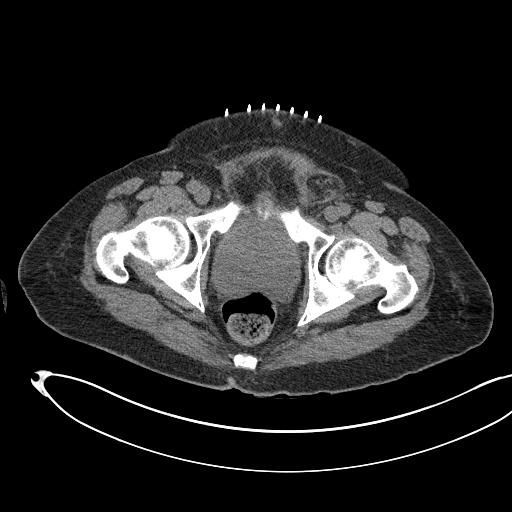
[im 14/46  soft-tissue]
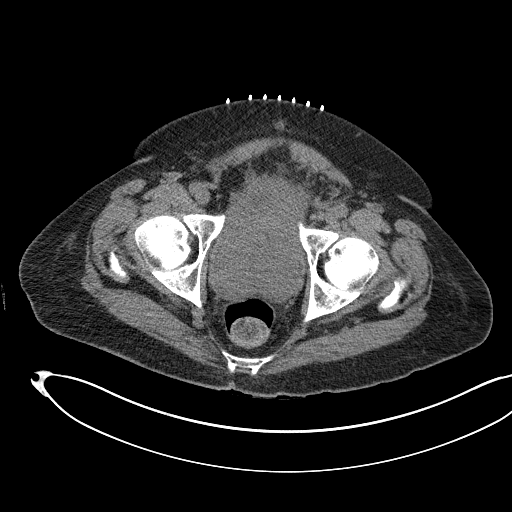
[im 18/46  soft-tissue]
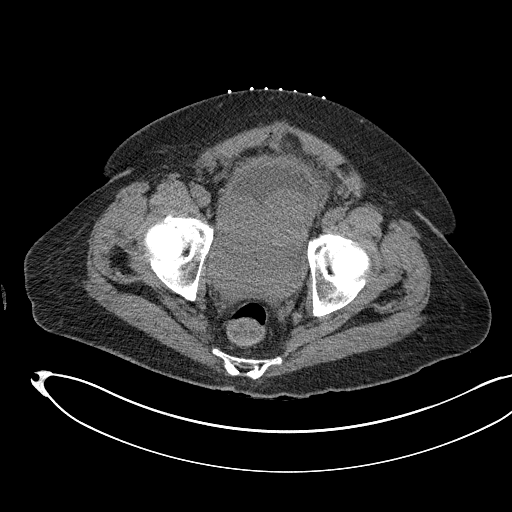
[im 21/46  soft-tissue]
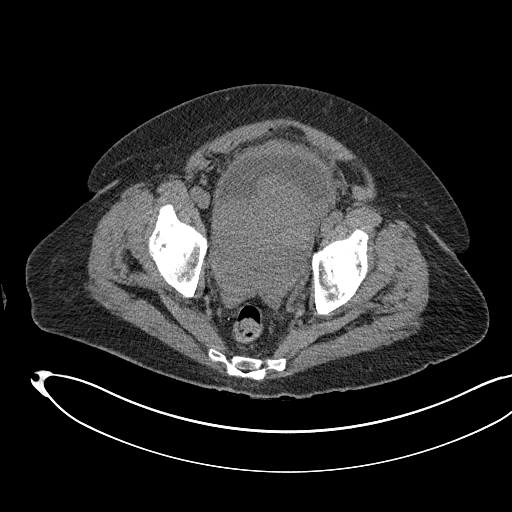
[im 25/46  soft-tissue]
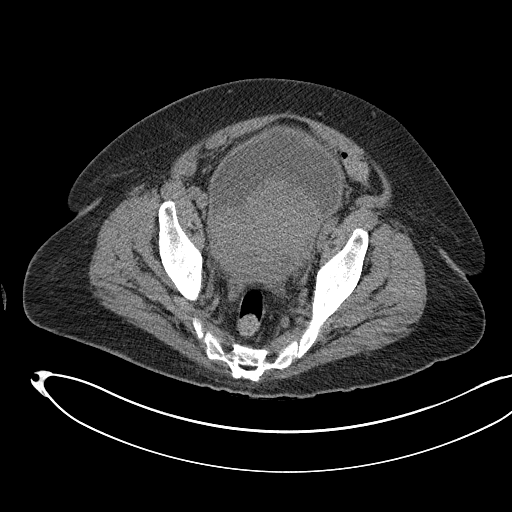
[im 28/46  soft-tissue]
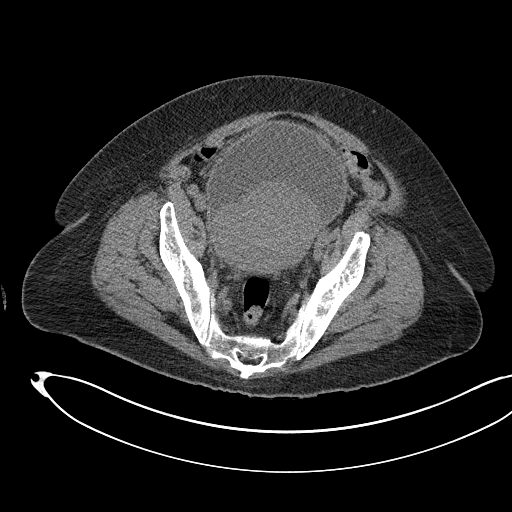
[im 32/46  soft-tissue]
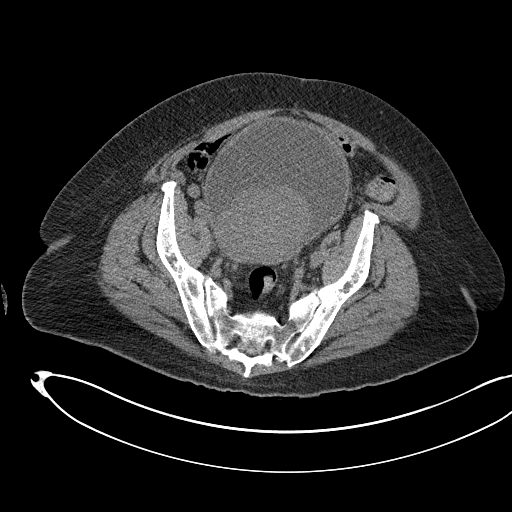
[im 32/46  bone]
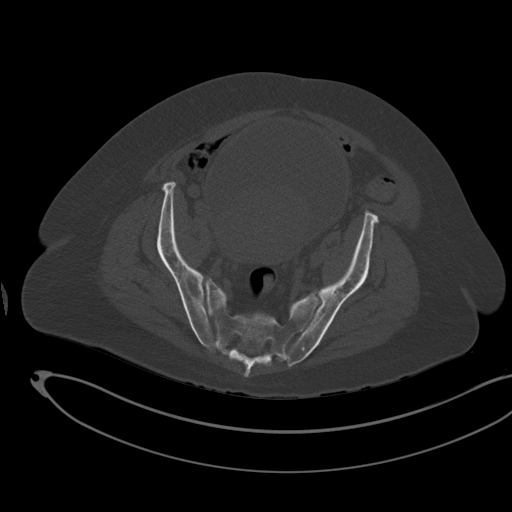
[im 35/46  soft-tissue]
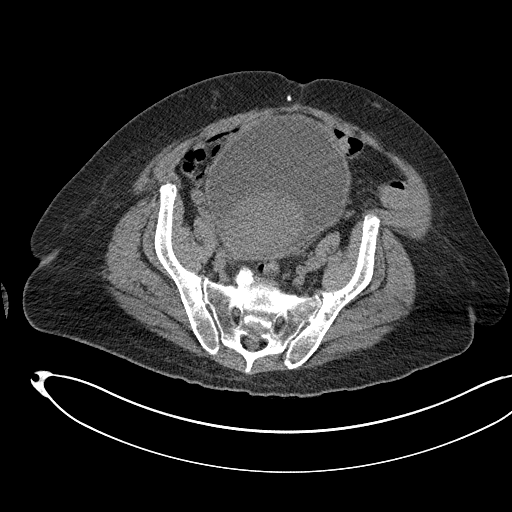
[im 39/46  soft-tissue]
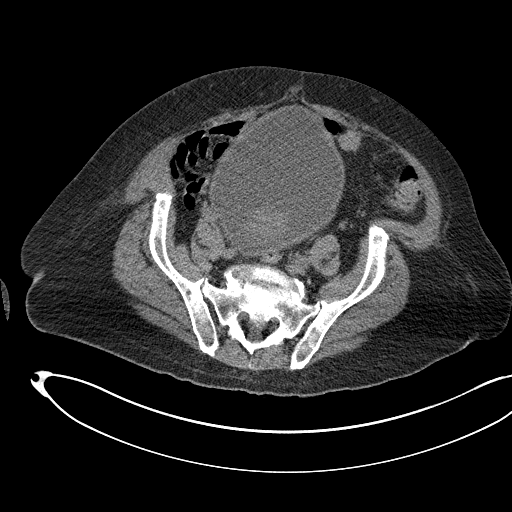
[im 39/46  lung]
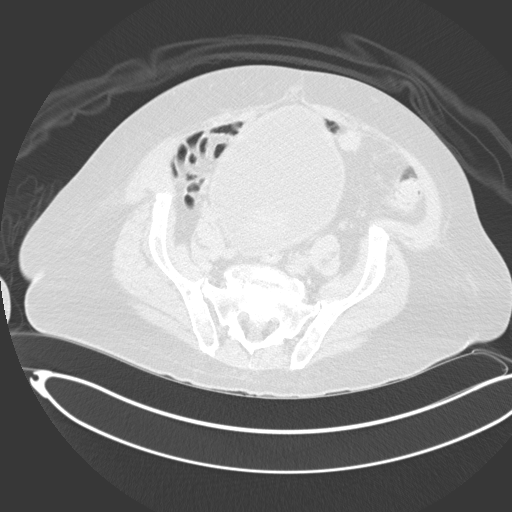
[im 40/46  lung]
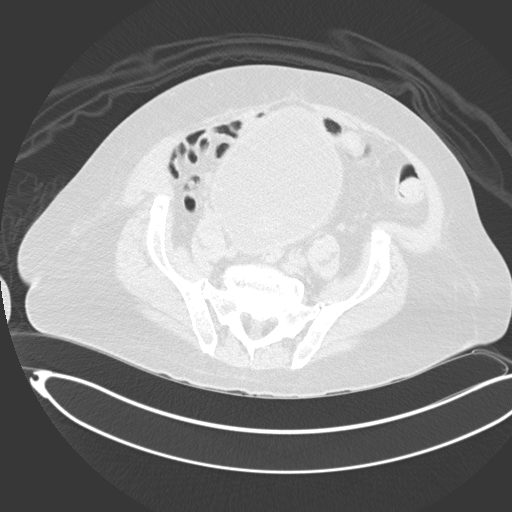
[im 42/46  soft-tissue]
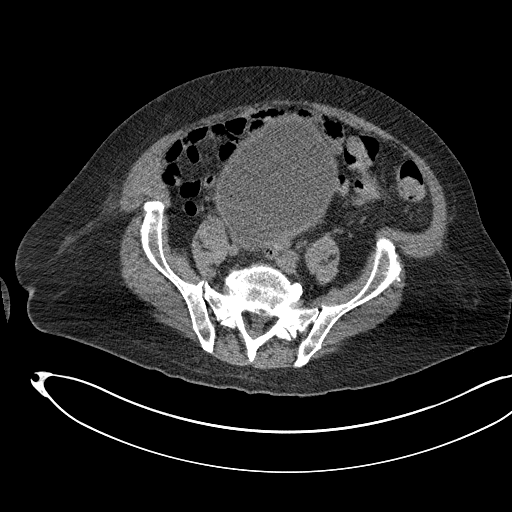
[im 42/46  lung]
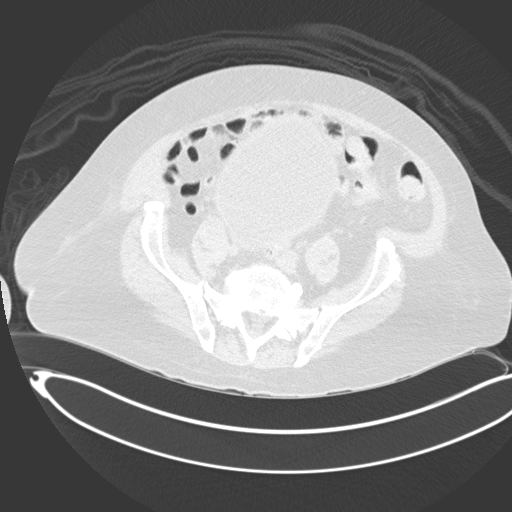
[im 44/46  lung]
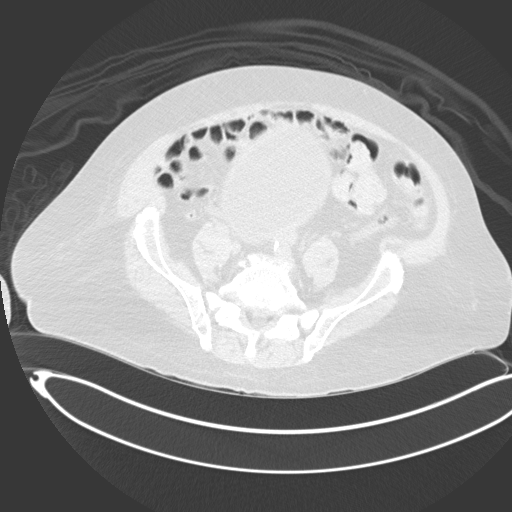

[14 of 32 positions shown; findings below may reference images not displayed]

MEDICATIONS:
None.

ANESTHESIA/SEDATION:
Fentanyl 100 mcg IV; Versed 2 mg IV

Moderate Sedation Time:  12

The patient was continuously monitored during the procedure by the
interventional radiology nurse under my direct supervision.

CONTRAST:  None

FLUOROSCOPY TIME:  None

COMPLICATIONS:
None immediate.

PROCEDURE:
Informed written consent was obtained from the patient after a
thorough discussion of the procedural risks, benefits and
alternatives. All questions were addressed. Maximal Sterile Barrier
Technique was utilized including caps, mask, sterile gowns, sterile
gloves, sterile drape, hand hygiene and skin antiseptic. A timeout
was performed prior to the initiation of the procedure.

A planning axial CT scan was performed. The bladder is markedly
distended. A suitable skin entry site in the midline just inferior
to the umbilicus was selected and marked. The overlying skin was
sterilely prepped and draped in the standard fashion using
chlorhexidine skin prep. Local anesthesia was attained by
infiltration with 1% lidocaine. A small dermatotomy was made.

Under intermittent CT guidance, an 18 gauge trocar needle was
advanced through the lower anterior abdominal wall and into the
bladder. A 0.035 wire was then coiled in the bladder. The skin tract
was dilated to 14 French and Youshin Dyakopu 14 French all-purpose drainage
catheter was advanced over the wire and formed in the bladder. The
catheter was connected to a urinary leg bag and secured to the skin
with 0 Prolene suture. Sterile bandages were applied.

Follow-up CT imaging demonstrates a well-positioned suprapubic
catheter and no evidence of immediate complication.
IMPRESSION: Successful placement of a 14 French suprapubic bladder catheter.

PLAN:
1. [REDACTED] in 4-6 weeks for catheter
exchange and up size.
2. Consider referral to Interventional Radiology to evaluate for
possible prosthetic artery embolization. This procedure could
improve the patient's underlying bladder outlet obstruction and
removed the need for chronic catheter dependence.

## 2021-06-04 IMAGING — DX DG ABD PORTABLE 1V
1 series · 1 of 1 positions shown · non-contrast
Comparison: None.

CLINICAL DATA: NG tube placement

EXAM:
PORTABLE ABDOMEN - 1 VIEW

[abdomen]
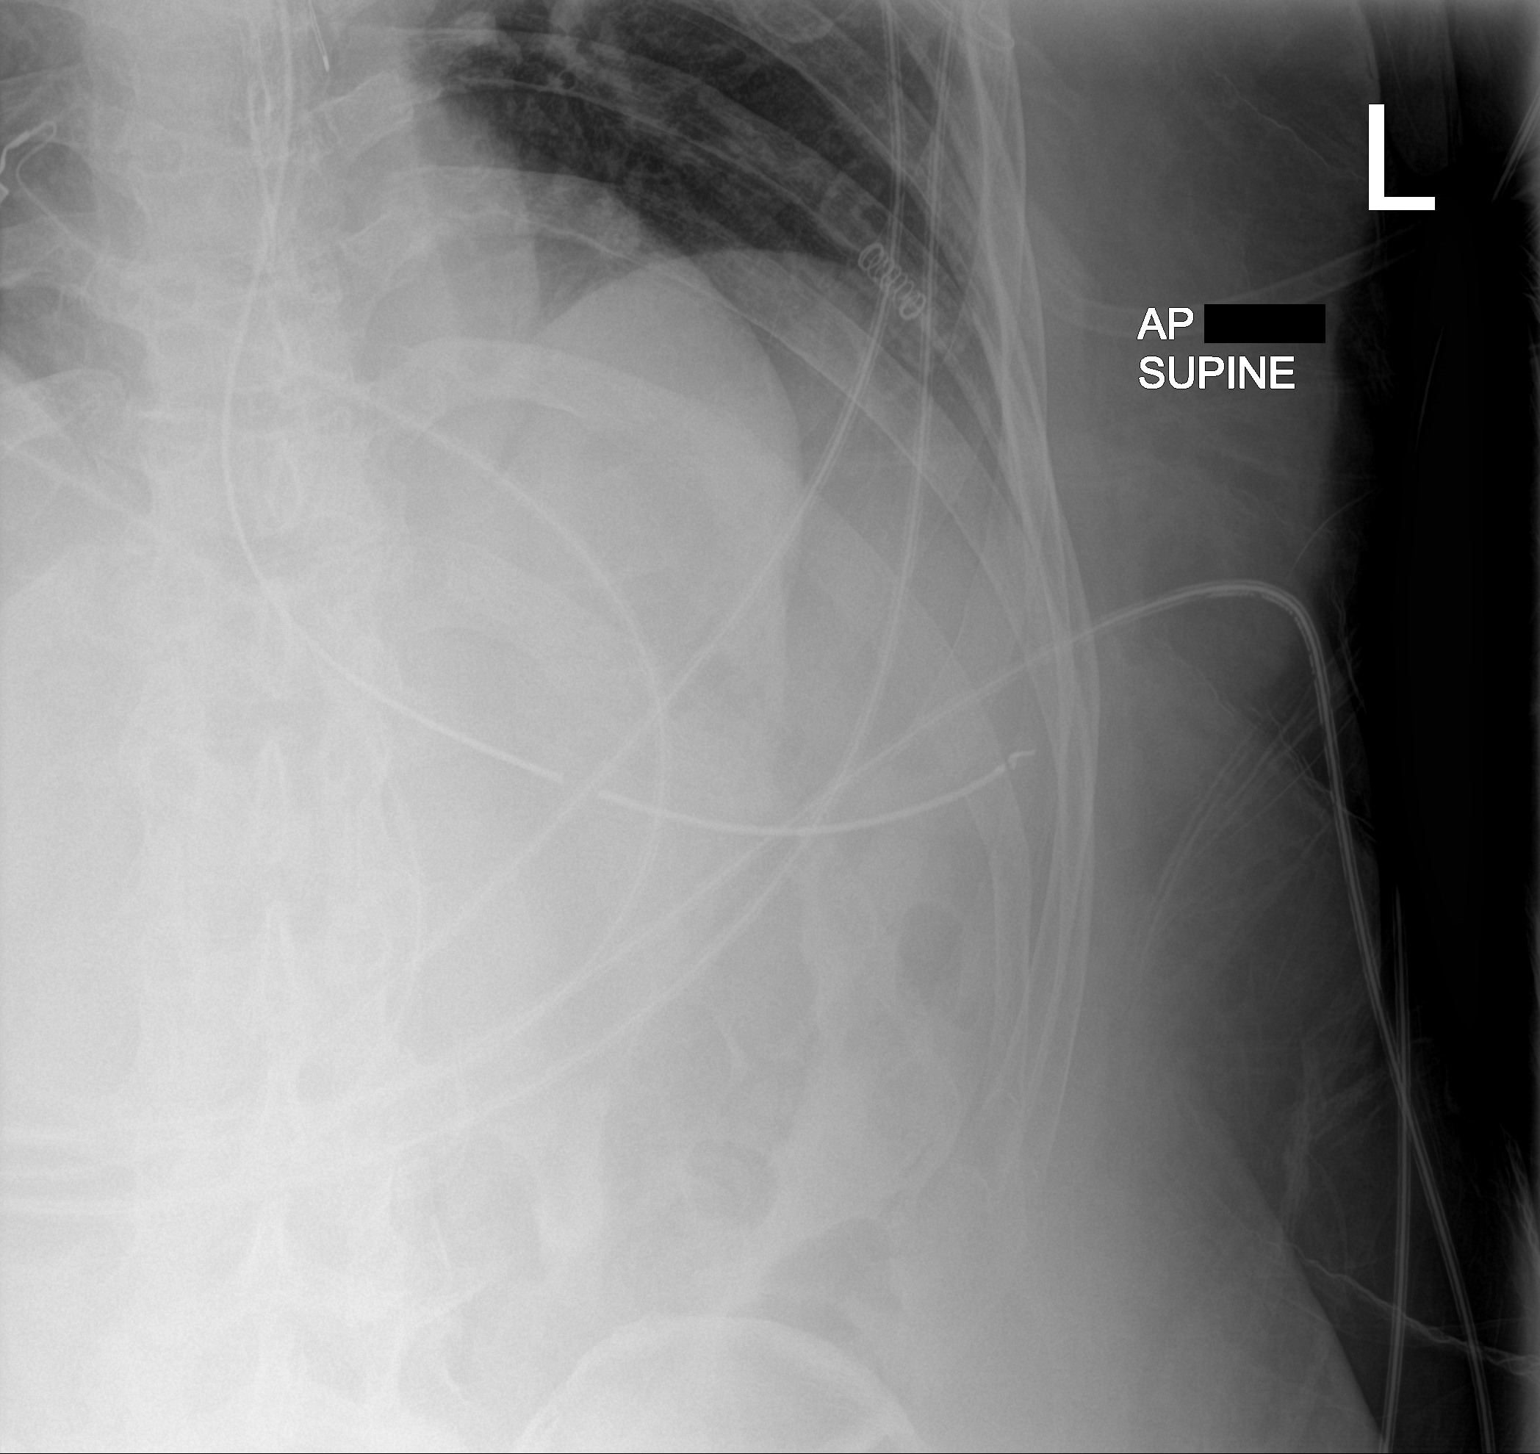

[1 of 1 positions shown; findings below may reference images not displayed]

FINDINGS: Enteric tube terminates within the stomach. Included bowel gas
pattern is unremarkable.
IMPRESSION: Enteric tube within the stomach.

## 2021-06-04 IMAGING — DX DG CHEST 1V PORT
1 series · 1 of 1 positions shown · non-contrast
Comparison: None.

CLINICAL DATA: Intubation

EXAM:
PORTABLE CHEST 1 VIEW

[chest ap]
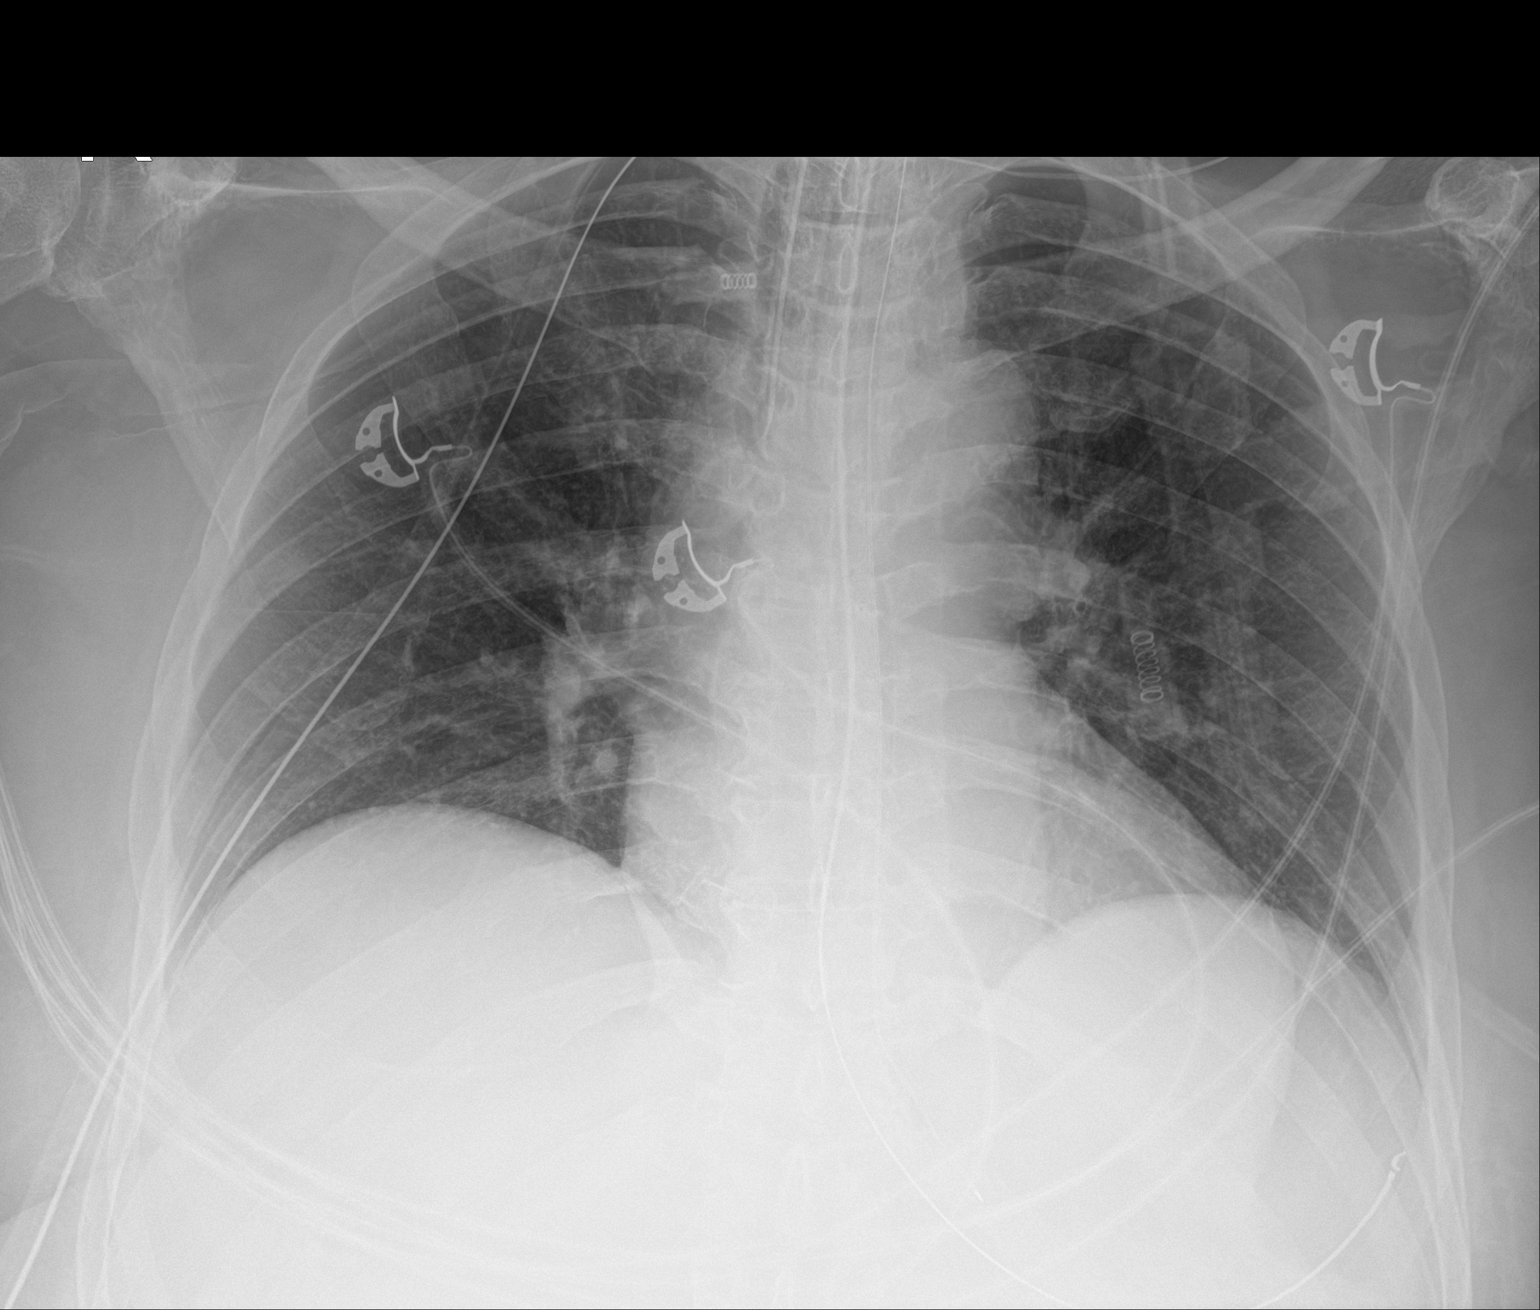

[1 of 1 positions shown; findings below may reference images not displayed]

FINDINGS: Endotracheal tube with tip halfway between the clavicular heads and
carina, 2.5 cm above the carina. The enteric tube and thermistor
reaches the stomach. Generous heart size likely from low volumes.
There is no edema, consolidation, effusion, or pneumothorax.
IMPRESSION: Unremarkable hardware positioning and low volume but clear lungs.

## 2021-07-04 ENCOUNTER — Encounter: Payer: Medicare Other | Admitting: Nurse Practitioner

## 2021-07-04 ENCOUNTER — Ambulatory Visit: Payer: Medicare Other | Admitting: Nurse Practitioner

## 2021-07-05 NOTE — Progress Notes (Signed)
err

## 2021-07-08 ENCOUNTER — Encounter: Payer: Medicare Other | Admitting: Nurse Practitioner

## 2021-07-12 ENCOUNTER — Telehealth: Payer: Self-pay

## 2021-07-12 ENCOUNTER — Encounter: Payer: Medicare Other | Admitting: Nurse Practitioner

## 2021-07-12 ENCOUNTER — Other Ambulatory Visit: Payer: Self-pay

## 2021-07-12 ENCOUNTER — Other Ambulatory Visit: Payer: Self-pay | Admitting: Nurse Practitioner

## 2021-07-12 DIAGNOSIS — I10 Essential (primary) hypertension: Secondary | ICD-10-CM

## 2021-07-12 NOTE — Telephone Encounter (Signed)
I made 3 unsuccessful attempts to connect with patient to start AWV. I left voicemail messages with each call, which on the 3rd and final attempt I informed patient he would need to call the office to reschedule appointment.  1st attempt-8:24 am 2nd Attempt-8:46 am 3rd attempt-9:02 am

## 2021-07-18 ENCOUNTER — Ambulatory Visit (INDEPENDENT_AMBULATORY_CARE_PROVIDER_SITE_OTHER): Payer: Medicare Other | Admitting: Nurse Practitioner

## 2021-07-18 ENCOUNTER — Encounter: Payer: Self-pay | Admitting: Nurse Practitioner

## 2021-07-18 ENCOUNTER — Other Ambulatory Visit: Payer: Self-pay

## 2021-07-18 VITALS — BP 140/80 | HR 63 | Temp 97.3°F | Ht 68.0 in | Wt 197.8 lb

## 2021-07-18 DIAGNOSIS — Z23 Encounter for immunization: Secondary | ICD-10-CM

## 2021-07-18 DIAGNOSIS — Z91199 Patient's noncompliance with other medical treatment and regimen due to unspecified reason: Secondary | ICD-10-CM | POA: Diagnosis not present

## 2021-07-18 DIAGNOSIS — I700 Atherosclerosis of aorta: Secondary | ICD-10-CM

## 2021-07-18 DIAGNOSIS — S31109A Unspecified open wound of abdominal wall, unspecified quadrant without penetration into peritoneal cavity, initial encounter: Secondary | ICD-10-CM | POA: Diagnosis not present

## 2021-07-18 DIAGNOSIS — Z9359 Other cystostomy status: Secondary | ICD-10-CM | POA: Diagnosis not present

## 2021-07-18 DIAGNOSIS — N401 Enlarged prostate with lower urinary tract symptoms: Secondary | ICD-10-CM

## 2021-07-18 DIAGNOSIS — B182 Chronic viral hepatitis C: Secondary | ICD-10-CM

## 2021-07-18 DIAGNOSIS — I10 Essential (primary) hypertension: Secondary | ICD-10-CM

## 2021-07-18 DIAGNOSIS — R338 Other retention of urine: Secondary | ICD-10-CM

## 2021-07-18 NOTE — Patient Instructions (Signed)
To use xeroform dressing to cover wound every other day and then place non stick over To use saline to clean area when changing dressing.   Home health and wound care center will reach out to you for follow up .

## 2021-07-18 NOTE — Progress Notes (Signed)
Careteam: Patient Care Team: Sharon Seller, NP as PCP - General (Geriatric Medicine)  PLACE OF SERVICE:  [facility]  Advanced Directive information    No Known Allergies  Chief Complaint  Patient presents with   Medical Management of Chronic Issues    4 month follow up. Patient has area on lower abdomen that seems to be infected. Area came up about a month ago.   Health Maintenance    Shingrix vaccine, Flu vaccine,COVID booster     HPI: Patient is a 76 y.o. male here today for follow up.  He was seen in ED on 12/12 due to abdominal abcess to lower abdomen, this area has since opened up but has yet to heal.  He is changing dressing about once a week with a nonstick dressing. Does not have help with this.   Reports he needs more help around the house. He needs at his apartment cleaned and wants [place] to find someone to help him.   Reports he is trying to eat better.  Does not wish to follow up with urologist- suprapubic draining well.    Review of Systems:  Review of Systems  Constitutional:  Negative for chills, fever and weight loss.  HENT:  Negative for tinnitus.   Respiratory:  Negative for cough, sputum production and shortness of breath.   Cardiovascular:  Positive for leg swelling. Negative for chest pain and palpitations.  Gastrointestinal:  Negative for abdominal pain, constipation, diarrhea and heartburn.  Genitourinary:  Negative for dysuria, frequency and urgency.  Musculoskeletal:  Negative for back pain, falls, joint pain and myalgias.  Skin:        Open area on lower abdomen  Neurological:  Negative for dizziness and headaches.  Psychiatric/Behavioral:  Negative for depression and memory loss. The patient does not have insomnia.    Past Medical History:  Diagnosis Date   Anemia 10/02/2019   DUE TO BLOOD LOSS   Blind    Disorder of bone and cartilage, unspecified    Edema    Elevated prostate specific antigen (PSA)    Hypertension     Hypopotassemia    Nonspecific abnormal results of liver function study    Unspecified glaucoma(365.9)    Past Surgical History:  Procedure Laterality Date   CYSTOSCOPY WITH DIRECT VISION INTERNAL URETHROTOMY N/A 10/02/2019   Procedure: CYSTOSCOPY WITH DIRECT VISION INTERNAL URETHROTOMY WITH CLOT EVACUATION retrograd urethralgram Coverted to Open.;  Surgeon: Crista Elliot, MD;  Location: [facility] OR;  Service: Urology;  Laterality: N/A;   CYSTOSCOPY WITH FULGERATION N/A 01/17/2018   Procedure: CYSTOSCOPY CLOT EVACUATION WITH FULGERATION TUR BIOPSY OF BLADDER NECK;  Surgeon: Bjorn Pippin, MD;  Location: [facility] OR[facility];  Service: Urology;  Laterality: N/A;   CYSTOSTOMY N/A 10/02/2019   Procedure: Open Cystostomy Suprapubic tube placement and JP drain and urethral dilation;  Surgeon: Crista Elliot, MD;  Location: [facility] OR;  Service: Urology;  Laterality: N/A;   EYE SURGERY Bilateral 2009   for glaucoma   EYE SURGERY Left 2014   HERNIA REPAIR Right 2003   Dr. Laroy Apple   IR ANGIOGRAM EXTREMITY RIGHT  10/03/2019   IR ANGIOGRAM PELVIS SELECTIVE OR SUPRASELECTIVE  10/03/2019   IR ANGIOGRAM PELVIS SELECTIVE OR SUPRASELECTIVE  10/03/2019   IR ANGIOGRAM SELECTIVE EACH ADDITIONAL VESSEL  10/03/2019   IR ANGIOGRAM SELECTIVE EACH ADDITIONAL VESSEL  10/03/2019   IR ANGIOGRAM SELECTIVE EACH ADDITIONAL VESSEL  10/03/2019   IR EMBO ART  VEN HEMORR LYMPH EXTRAV  INC GUIDE ROADMAPPING  10/03/2019   IR US GUIDE VASC ACCESS RIGHT  10/03/2019   LAPAROSCOPIC APPENDECTOMY N/A 12/24/2012   Procedure:  LAPAROSCOPIC APPENDECTOMY ;  Surgeon: Pedro Earls, MD;  Location: [facility] ORS;  Service: General;  Laterality: N/A;   TRANSURETHRAL RESECTION OF PROSTATE N/A 05/27/2019   Procedure: Cystoscopy Clot Evacuation and Fulgration;  Surgeon: Ardis Hughs, MD;  Location: [facility] ORS;  Service: Urology;  Laterality: N/A;   Social History:   reports that he quit smoking about 53 years ago. His smoking use included cigarettes. He has  never used smokeless tobacco. He reports that he does not drink alcohol and does not use drugs.  Family History  Problem Relation Age of Onset   Kidney failure Mother     Medications: Patient's Medications  New Prescriptions   No medications on file  Previous Medications   ASCORBIC ACID (VITAMIN C WITH ROSE HIPS) 1000 MG TABLET    Take 2,000 mg by mouth daily with lunch.    ASPIRIN-ACETAMINOPHEN-CAFFEINE (EXCEDRIN MIGRAINE) 250-250-65 MG TABLET    Take 1 tablet by mouth daily as needed (pain).    B COMPLEX VITAMINS (VITAMIN-B COMPLEX PO)    Take 1 tablet by mouth daily.   BRIMONIDINE (ALPHAGAN) 0.2 % OPHTHALMIC SOLUTION    Place 1 drop into both eyes 2 (two) times daily.   DORZOLAMIDE-TIMOLOL (COSOPT) 22.3-6.8 MG/ML OPHTHALMIC SOLUTION    APPOINTMENT OVERDUE, instill 1 drop in both eyes twice daily   FOLIC ACID (FOLVITE) 654 MCG TABLET    Take 800 mcg by mouth 2 (two) times a week. Twice a week   HYDROCHLOROTHIAZIDE (HYDRODIURIL) 12.5 MG TABLET    TAKE 1 TABLET(12.5 MG) BY MOUTH DAILY   IRON, FERROUS SULFATE, 325 (65 FE) MG TABS    Take 325 mg by mouth daily.   LISINOPRIL (ZESTRIL) 10 MG TABLET    TAKE 1 TABLET(10 MG) BY MOUTH DAILY   MAGNESIUM 250 MG TABS    Take 250 mg by mouth daily as needed (migraine headache).    PILOCARPINE (PILOCAR) 2 % OPHTHALMIC SOLUTION    Place 1 drop into the right eye every 6 (six) hours.    POTASSIUM CHLORIDE SA (KLOR-CON) 20 MEQ TABLET    TAKE 1 TABLET(20 MEQ) BY MOUTH DAILY   PYRIDOXINE (VITAMIN B-6) 50 MG TABLET    Take 50 mg by mouth daily with lunch.    VITAMIN B-12 (CYANOCOBALAMIN) 1000 MCG TABLET    Take 1,000 mcg by mouth daily with lunch.  Modified Medications   No medications on file  Discontinued Medications   No medications on file    Physical Exam:  Vitals:   07/18/21 1122  BP: 140/80  Pulse: 63  Temp: (!) 97.3 F (36.3 C)  SpO2: 97%  Weight: 197 lb 12.8 oz (89.7 kg)  Height: $Remove'5\' 8"'KtEIDAT$  (1.727 m)   Body mass index is 30.08 kg/m. Wt  Readings from Last 3 Encounters:  07/18/21 197 lb 12.8 oz (89.7 kg)  01/03/21 203 lb (92.1 kg)  11/29/20 199 lb (90.3 kg)    Physical Exam Constitutional:      General: He is not in acute distress.    Appearance: He is well-developed. He is not diaphoretic.  HENT:     Head: Normocephalic and atraumatic.     Right Ear: External ear normal.     Left Ear: External ear normal.     Mouth/Throat:     Pharynx: No oropharyngeal exudate.  Eyes:  Conjunctiva/sclera: Conjunctivae normal.     Pupils: Pupils are equal, round, and reactive to light.  Cardiovascular:     Rate and Rhythm: Normal rate and regular rhythm.     Heart sounds: Normal heart sounds.  Pulmonary:     Effort: Pulmonary effort is normal.     Breath sounds: Normal breath sounds.  Abdominal:     General: Bowel sounds are normal. There is distension.     Palpations: Abdomen is soft.     Comments: Suprapubic cath  Musculoskeletal:        General: No tenderness.     Cervical back: Normal range of motion and neck supple.     Right lower leg: No edema.     Left lower leg: No edema.  Skin:    General: Skin is warm and dry.  Neurological:     Mental Status: He is alert and oriented to person, place, and time.    Labs reviewed: Basic Metabolic Panel: Recent Labs    09/02/20 1811 01/03/21 1321  NA 140 140  K 4.0 4.1  CL 107 106  CO2 20* 25  GLUCOSE 75 91  BUN 23 29*  CREATININE 0.98 1.01  CALCIUM 8.6* 8.8   Liver Function Tests: Recent Labs    09/02/20 1811 01/03/21 1321  AST 36 47*  ALT 24 30  ALKPHOS 92  --   BILITOT 0.9 0.7  PROT 7.0 6.6  ALBUMIN 3.7  --    No results for input(s): LIPASE, AMYLASE in the last 8760 hours. No results for input(s): AMMONIA in the last 8760 hours. CBC: Recent Labs    09/02/20 2010 01/03/21 1321  WBC 4.9 5.6  NEUTROABS  --  3,595  HGB 12.9* 12.2*  HCT 41.0 36.8*  MCV 84.0 88.5  PLT 174 204   Lipid Panel: No results for input(s): CHOL, HDL, LDLCALC, TRIG,  CHOLHDL, LDLDIRECT in the last 8760 hours. TSH: No results for input(s): TSH in the last 8760 hours. A1C: No results found for: HGBA1C   Assessment/Plan 1. Suprapubic catheter ([place]) -continues with suprapubic cath, intact and working well at this time.   2. Aortic atherosclerosis ([place]) -noted on imaging recommending follow up with lipid panel- pt declines lab - Lipid panel  3. 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   4. Primary hypertension --stable. Goal bp <140/90. Continue on HCTZ and lisinopril with low sodium diet.  - CBC with Differential/Platelet - CMP with eGFR([facility]) (Pt refusing labs)  5. Benign prostatic hyperplasia with urinary retention Continues with suprapubic cath. No signs of complications at this time.   6. Need for influenza vaccination - Flu Vaccine QUAD High Dose(Fluad)  7. Wound of abdomen -area cleaned with saline, picture placed on media tab -xeroform dressing applied and to be changed every other day with a nonstick covering. Pt educated on care.  - AMB referral to wound care center - Ambulatory referral to [place]  8. Noncompliance -patient has continued to miss several appts. He is here today but declines lab work. He states he wanted [place] to take labs for his vitamin levels. When I explained to him that I could order these labs but they may not be covered by insurance he got very agitated. Explained to patient we have to periodically monitor electrolytes and renal function due to medications he is on. Explained I would not be able to continue to prescribe medication without routine follow up and lab work and if I could not prescribe  medication I could not be his provider. He stated "that would be fine" and got up to leave without lab work.   Carlos American. [place], [facility] [phone]

## 2021-07-28 ENCOUNTER — Telehealth: Payer: Self-pay | Admitting: Nurse Practitioner

## 2021-07-28 ENCOUNTER — Other Ambulatory Visit: Payer: Self-pay | Admitting: Nurse Practitioner

## 2021-07-28 NOTE — Telephone Encounter (Signed)
Due to last encounter with patient in office he will need to schedule appt with Dr Sabra Heck prior to any additional refills.

## 2021-09-19 ENCOUNTER — Ambulatory Visit: Payer: Medicare Other | Admitting: Nurse Practitioner

## 2021-09-19 ENCOUNTER — Telehealth: Payer: Self-pay

## 2021-09-19 NOTE — Telephone Encounter (Signed)
He needs to schedule follow up with Dr Sabra Heck, I will no longer be his PCP, he had called to schedule appt when office was closed 2 weeks ago so may have been calling to follow up on that.  ?

## 2021-09-19 NOTE — Telephone Encounter (Signed)
I will forward message to front administration to follow up with patient.  ?

## 2021-09-19 NOTE — Telephone Encounter (Signed)
Patient called the office stating that someone called his phone. I verbalized with patient that I was unsure of why phone call was made. Before I could tell patient I would look further into it he said "THEN FIND OUT!!". Patient was on hold and call got disconnected. Patient called the office back and accused me of hanging up on him. I told patient I didn't hang up on him, and the call got disconnected. I let patient know that his appointment was cancelled today 09/19/2021. Patient said "I KNOW THAT ALREADY!, WHY DID SOMEONE CALL ME!!" I told patient that all I could find documented was cancelled appointment. Other than that Im unsure. Patient became rude, and irate. I verbalized to patient that he would not speak to me in a disrespectful manner. I let patient know that I'm only trying to help. Patient continued to be argumentative. I asked patient if there was anything else I could help with. Patient said NO!, I told patient to have a great day and hung up the phone. Message routed to PCP Dewaine Oats, Carlos American, NP as Juluis Rainier.  ?

## 2021-09-20 NOTE — Telephone Encounter (Signed)
He needs to be scheduled with Dr Sabra Heck for follow up appt. Thank you  ?

## 2021-09-22 NOTE — Telephone Encounter (Signed)
This is the  message that was sent by Kathyrn Lass (referral coordinator and front desk registrar) after Jasmine's entry on 09/19/2021: ? ?I apologize for ALL the confusion with Mr Waterhouse nut it was I(Lisa ) that called him. I called to check on the Home Health status & follow up on the referral we put in for home RN assistance with his abdomen wound. I have called 2 x & documented that in his referral notes.  ?I apologized for his rudeness to you & not being able to address this situation. I also wasn't able to locate any notes where [facility] has evaluated either.  ? ?Thanks, Vilinda Blanks  ? ?The above message was sent in a way that does not save to patients chart (reason I copied and pasted).  ? ? ?I have now left a detailed message requesting for patient to return call and speak with whomever answers the phone to schedule a follow-up with Dr.Miller (call does not have to be transferred or passed off to me, necessarily), as previously instructed when he called [name], NP on call. ?

## 2021-10-05 ENCOUNTER — Ambulatory Visit: Payer: Medicare Other | Admitting: Family Medicine

## 2021-10-06 ENCOUNTER — Other Ambulatory Visit: Payer: Self-pay

## 2021-10-06 ENCOUNTER — Emergency Department (HOSPITAL_BASED_OUTPATIENT_CLINIC_OR_DEPARTMENT_OTHER)
Admission: EM | Admit: 2021-10-06 | Discharge: 2021-10-06 | Disposition: A | Discharge status: Home / Self Care | Payer: Medicare Other | Attending: Emergency Medicine | Admitting: Emergency Medicine

## 2021-10-06 ENCOUNTER — Encounter (HOSPITAL_BASED_OUTPATIENT_CLINIC_OR_DEPARTMENT_OTHER): Payer: Self-pay | Admitting: Emergency Medicine

## 2021-10-06 DIAGNOSIS — Z7982 Long term (current) use of aspirin: Secondary | ICD-10-CM | POA: Insufficient documentation

## 2021-10-06 DIAGNOSIS — T83010A Breakdown (mechanical) of cystostomy catheter, initial encounter: Secondary | ICD-10-CM

## 2021-10-06 DIAGNOSIS — N3090 Cystitis, unspecified without hematuria: Secondary | ICD-10-CM | POA: Insufficient documentation

## 2021-10-06 DIAGNOSIS — T83090A Other mechanical complication of cystostomy catheter, initial encounter: Secondary | ICD-10-CM | POA: Insufficient documentation

## 2021-10-06 DIAGNOSIS — N390 Urinary tract infection, site not specified: Secondary | ICD-10-CM

## 2021-10-06 DIAGNOSIS — Z743 Need for continuous supervision: Secondary | ICD-10-CM | POA: Diagnosis not present

## 2021-10-06 DIAGNOSIS — T83198A Other mechanical complication of other urinary devices and implants, initial encounter: Secondary | ICD-10-CM | POA: Diagnosis not present

## 2021-10-06 DIAGNOSIS — X58XXXA Exposure to other specified factors, initial encounter: Secondary | ICD-10-CM | POA: Insufficient documentation

## 2021-10-06 DIAGNOSIS — T83510A Infection and inflammatory reaction due to cystostomy catheter, initial encounter: Secondary | ICD-10-CM | POA: Diagnosis not present

## 2021-10-06 DIAGNOSIS — R531 Weakness: Secondary | ICD-10-CM | POA: Diagnosis not present

## 2021-10-06 LAB — URINALYSIS, ROUTINE W REFLEX MICROSCOPIC
Bilirubin Urine: NEGATIVE
Glucose, UA: NEGATIVE mg/dL
Ketones, ur: NEGATIVE mg/dL
Nitrite: POSITIVE — AB
Protein, ur: 30 mg/dL — AB
Specific Gravity, Urine: 1.014 (ref 1.005–1.030)
pH: 5.5 (ref 5.0–8.0)

## 2021-10-06 MED ORDER — NITROFURANTOIN MONOHYD MACRO 100 MG PO CAPS: 100.0000 mg | ORAL_CAPSULE | Freq: Two times a day (BID) | ORAL | 0 refills | Status: DC

## 2021-10-06 MED ORDER — NITROFURANTOIN MONOHYD MACRO 100 MG PO CAPS
100.0000 mg | ORAL_CAPSULE | Freq: Once | ORAL | Status: AC
  Administered 2021-10-06: 100 mg via ORAL
  Filled 2021-10-06: qty 1

## 2021-10-06 NOTE — ED Triage Notes (Signed)
Pt BIB [facility], pt had a foley catheter placed 6 months for a cyst on his bladder. Hx of a cyst x4 years. No pain to foley site, visually impaired. Ambulatory, able to go down a flight of stairs and walk to EMS truck ? ? ?BP 142/86 ?HR 74 ?RR 18 ?100% RA  ?CBG 103 ?

## 2021-10-06 NOTE — ED Triage Notes (Signed)
Pt arrives to ED via [facility] and reports that his suprapubic catheter has fallen out today. He currently denies pain.  ?

## 2021-10-06 NOTE — ED Provider Notes (Signed)
?[facility] EMERGENCY DEPT ?Provider Note ? ? ?CSN: [id number] ?Arrival date & time: 10/06/21  1134 ? ?  ? ?History ? ?Chief Complaint  ?Patient presents with  ? Suprapubic Catheter Problem   ? ? ?[name] [name]. is a 76 y.o. male. ? ?Pt is a 76 yo male with a pmhx significant for glaucoma causing significant vision impairment, htn, and BPH requiring suprapubic catheter.  Pt said his catheter popped out today around 1045.  He called EMS who brought him here.  He denies any other sx. ? ? ?  ? ?Home Medications ?Prior to Admission medications   ?Medication Sig Start Date End Date Taking? Authorizing Provider  ?nitrofurantoin, macrocrystal-monohydrate, (MACROBID) 100 MG capsule Take 1 capsule (100 mg total) by mouth 2 (two) times daily. 10/06/21  Yes Isla Pence, MD  ?Ascorbic Acid (VITAMIN C WITH ROSE HIPS) 1000 MG tablet Take 2,000 mg by mouth daily with lunch.     [provider]  ?aspirin-acetaminophen-caffeine (EXCEDRIN MIGRAINE) [phone] MG tablet Take 1 tablet by mouth daily as needed (pain).     [provider]  ?B Complex Vitamins (VITAMIN-B COMPLEX PO) Take 1 tablet by mouth daily.    [provider]  ?brimonidine (ALPHAGAN) 0.2 % ophthalmic solution Place 1 drop into both eyes 2 (two) times daily. 03/11/19   [provider]  ?dorzolamide-timolol (COSOPT) 22.3-6.8 MG/ML ophthalmic solution APPOINTMENT OVERDUE, instill 1 drop in both eyes twice daily 07/14/13   [name], NP  ?folic acid (FOLVITE) 017 MCG tablet Take 800 mcg by mouth 2 (two) times a week. Twice a week    [provider]  ?hydrochlorothiazide (HYDRODIURIL) 12.5 MG tablet TAKE 1 TABLET(12.5 MG) BY MOUTH DAILY ?Patient taking differently: Refill must go through provider 07/12/21   [name], NP  ?Iron, Ferrous Sulfate, 325 (65 Fe) MG TABS Take 325 mg by mouth daily. 11/29/20   [name], NP  ?lisinopril (ZESTRIL) 10 MG tablet TAKE 1 TABLET(10 MG) BY MOUTH  DAILY ?Patient taking differently: Refill must go through provider 07/12/21   [name], NP  ?Magnesium 250 MG TABS Take 250 mg by mouth daily as needed (migraine headache).     [provider]  ?pilocarpine (PILOCAR) 2 % ophthalmic solution Place 1 drop into the right eye every 6 (six) hours.  12/25/17   [provider]  ?potassium chloride SA (KLOR-CON) 20 MEQ tablet TAKE 1 TABLET(20 MEQ) BY MOUTH DAILY ?Patient taking differently: Refill must go through provider 09/20/20   [name], NP  ?pyridOXINE (VITAMIN B-6) 50 MG tablet Take 50 mg by mouth daily with lunch.     [provider]  ?vitamin B-12 (CYANOCOBALAMIN) 1000 MCG tablet Take 1,000 mcg by mouth daily with lunch.    [provider]  ?   ? ?Allergies    ?Patient has no known allergies.   ? ?Review of Systems   ?Review of Systems  ?Genitourinary:   ?     Suprapubic catheter dislodged  ?All other systems reviewed and are negative. ? ?Physical Exam ?Updated Vital Signs ?BP (!) 148/85 (BP Location: Right Arm)   Pulse (!) 53   Temp 98.2 ?F (36.8 ?C)   Resp 12   Ht '5\' 8"'$  (1.727 m)   Wt 89.7 kg   SpO2 100%   BMI 30.07 kg/m?  ?Physical Exam ?Vitals and nursing note reviewed.  ?Constitutional:   ?   Appearance: Normal appearance.  ?HENT:  ?   Head:  Normocephalic and atraumatic.  ?   Right Ear: External ear normal.  ?   Left Ear: External ear normal.  ?   Nose: Nose normal.  ?   Mouth/Throat:  ?   Mouth: Mucous membranes are moist.  ?Eyes:  ?   Extraocular Movements: Extraocular movements intact.  ?Cardiovascular:  ?   Rate and Rhythm: Normal rate and regular rhythm.  ?   Pulses: Normal pulses.  ?   Heart sounds: Normal heart sounds.  ?Pulmonary:  ?   Effort: Pulmonary effort is normal.  ?   Breath sounds: Normal breath sounds.  ?Abdominal:  ?   General: Abdomen is flat. Bowel sounds are normal.  ?   Palpations: Abdomen is soft.  ?   Comments: Suprapubic cath site with a little bit of blood around site.   ?Musculoskeletal:     ?   General: Normal range of motion.  ?   Cervical back: Normal range of motion and neck supple.  ?Skin: ?   General: Skin is warm.  ?   Capillary Refill: Capillary refill takes less than 2 seconds.  ?Neurological:  ?   General: No focal deficit present.  ?   Mental Status: He is alert and oriented to person, place, and time.  ?Psychiatric:     ?   Mood and Affect: Mood normal.     ?   Behavior: Behavior normal.  ? ? ?ED Results / Procedures / Treatments   ?Labs ?(all labs ordered are listed, but only abnormal results are displayed) ?Labs Reviewed  ?URINALYSIS, ROUTINE W REFLEX MICROSCOPIC - Abnormal; Notable for the following components:  ?    Result Value  ? APPearance HAZY (*)   ? Hgb urine dipstick LARGE (*)   ? Protein, ur 30 (*)   ? Nitrite POSITIVE (*)   ? Leukocytes,Ua LARGE (*)   ? Bacteria, UA MANY (*)   ? All other components within normal limits  ?URINE CULTURE  ? ? ?EKG ?None ? ?Radiology ?No results found. ? ?Procedures ?ASPIRATION BLADDER INSERT SUPRAPUBIC CATHETER ? ?Date/Time: 10/06/2021 2:33 PM ?Performed by: Isla Pence, MD ?Authorized by: Isla Pence, MD  ?Consent: Verbal consent obtained. ?Patient identity confirmed: verbally with patient ?Preparation: Patient was prepped and draped in the usual sterile fashion. ?Local anesthesia used: no ? ?Anesthesia: ?Local anesthesia used: no ? ?Sedation: ?Patient sedated: no ? ?Patient tolerance: patient tolerated the procedure well with no immediate complications ?Comments: 75F catheter introduced into bladder via suprapubic tract ? ?  ? ? ?Medications Ordered in ED ?Medications  ?nitrofurantoin (macrocrystal-monohydrate) (MACROBID) capsule 100 mg (has no administration in time range)  ? ? ?ED Course/ Medical Decision Making/ A&P ?  ?                        ?Medical Decision Making ?Amount and/or Complexity of Data Reviewed ?Labs: ordered. ? ?Risk ?Prescription drug management. ? ? ?This patient presents to the ED for concern  of suprapubic catheter dislodged, this involves an extensive number of treatment options, and is a complaint that carries with it a high risk of complications and morbidity.  The differential diagnosis includes suprapubic catheter dislodged ? ? ?Co morbidities that complicate the patient evaluation ? ?glaucoma causing significant vision impairment, htn, and BPH requiring suprapubic catheter ? ? ?Additional history obtained: ? ?Additional history obtained from epic chart review ?External records from outside source obtained and reviewed including EMS report ? ? ?Lab Tests: ? ?I Ordered,  and personally interpreted labs.  The pertinent results include:  UA + for UTI.  Last culture that grew out bacteria was a multidrug resistant klebsiella sensitive to macrobid. ? ?Medicines ordered and prescription drug management: ? ?I ordered medication including macrobid  for uti  ?Reevaluation of the patient after these medicines showed that the patient stayed the same ?I have reviewed the patients home medicines and have made adjustments as needed ? ? ? ?Problem List / ED Course: ? ?Suprapubic catheter dislodged:  75F catheter replaced ?UTI:  pt put on macrobid.  Urine sent for cx ? ? ?Reevaluation: ? ?After the interventions noted above, I reevaluated the patient and found that they have :improved ? ? ?Social Determinants of Health: ? ?Lives at home alone.  Visually impaired.  Very little social support. ? ? ?Dispostion: ? ?After consideration of the diagnostic results and the patients response to treatment, I feel that the patent would benefit from discharge with outpatient f/u.   ? ? ? ? ? ? ? ?Final Clinical Impression(s) / ED Diagnoses ?Final diagnoses:  ?Suprapubic catheter dysfunction, initial encounter ([place])  ?Urinary tract infection associated with cystostomy catheter, initial encounter ([place])  ? ? ?Rx / DC Orders ?ED Discharge Orders   ? ?      Ordered  ?  nitrofurantoin, macrocrystal-monohydrate, (MACROBID) 100 MG  capsule  2 times daily       ? 10/06/21 1546  ? ?  ?  ? ?  ? ? ?  ?Isla Pence, MD ?10/06/21 1549 ? ?

## 2021-10-08 LAB — URINE CULTURE: Culture: >=100000 — AB

## 2021-10-09 ENCOUNTER — Telehealth (HOSPITAL_BASED_OUTPATIENT_CLINIC_OR_DEPARTMENT_OTHER): Payer: Self-pay | Admitting: *Deleted

## 2021-10-09 NOTE — Telephone Encounter (Signed)
Post ED Visit - Positive Culture Follow-up ? ?Culture report reviewed by antimicrobial stewardship pharmacist: ?[place] Team ?'[x]'$  Lorelei Pont,, Pharm.D. ?'[]'$  Heide Guile, Pharm.D., BCPS AQ-ID ?'[]'$  Parks Neptune, Pharm.D., BCPS ?'[]'$  Alycia Rossetti, Pharm.D., BCPS ?'[]'$  [place], Pharm.D., BCPS, AAHIVP ?'[]'$  Legrand Como, Pharm.D., BCPS, AAHIVP ?'[]'$  Salome Arnt, PharmD, BCPS ?'[]'$  Johnnette Gourd, PharmD, BCPS ?'[]'$  Hughes Better, PharmD, BCPS ?'[]'$  Leeroy Cha, PharmD ?'[]'$  Laqueta Linden, PharmD, BCPS ?'[]'$  Albertina Parr, PharmD ? ?[place] Team ?'[]'$  Leodis Sias, PharmD ?'[]'$  Lindell Spar, PharmD ?'[]'$  Royetta Asal, PharmD ?'[]'$  Graylin Shiver, Rph ?'[]'$  Rema Fendt) Glennon Mac, PharmD ?'[]'$  Arlyn Dunning, PharmD ?'[]'$  Netta Cedars, PharmD ?'[]'$  Dia Sitter, PharmD ?'[]'$  Leone Haven, PharmD ?'[]'$  Gretta Arab, PharmD ?'[]'$  Theodis Shove, PharmD ?'[]'$  Peggyann Juba, PharmD ?'[]'$  Reuel Boom, PharmD ? ? ?Positive urine culture ?Treated with Nitrofurantoin, organism sensitive to the same and no further patient follow-up is required at this time. ? ?Rosie Fate ?10/09/2021, 12:06 PM ?  ?

## 2021-12-02 ENCOUNTER — Other Ambulatory Visit: Payer: Self-pay | Admitting: Nurse Practitioner

## 2021-12-02 DIAGNOSIS — I10 Essential (primary) hypertension: Secondary | ICD-10-CM

## 2021-12-02 NOTE — Telephone Encounter (Signed)
Patient has request refill on medication Lisinopril. Medication states that refill must go through PCP Sabra Heck Lillette Boxer, MD . Medication pend and sent.

## 2021-12-07 ENCOUNTER — Other Ambulatory Visit: Payer: Self-pay | Admitting: Nurse Practitioner

## 2021-12-07 DIAGNOSIS — I10 Essential (primary) hypertension: Secondary | ICD-10-CM

## 2022-03-03 ENCOUNTER — Ambulatory Visit: Payer: Self-pay

## 2022-03-03 NOTE — Patient Outreach (Signed)
  Care Coordination   03/03/2022 Name: Tycen Dockter. MRN: [id number] DOB: Dec 28, 1945   Care Coordination Outreach Attempts:  An unsuccessful telephone outreach was attempted today to offer the patient information about available care coordination services as a benefit of their health plan.   Follow Up Plan:  Additional outreach attempts will be made to offer the patient care coordination information and services.   Encounter Outcome:  No Answer  Care Coordination Interventions Activated:  No   Care Coordination Interventions:  No, not indicated    Daneen Schick, BSW, CDP Social Worker, Certified Dementia Practitioner [facility] Care Management  Care Coordination [phone]

## 2022-03-16 ENCOUNTER — Ambulatory Visit: Payer: Self-pay

## 2022-03-16 NOTE — Patient Outreach (Signed)
  Care Coordination   03/16/2022 Name: [name] [name]. MRN: [id number] DOB: [birth year]   Care Coordination Outreach Attempts:  A second unsuccessful outreach was attempted today to offer the patient with information about available care coordination services as a benefit of their health plan.     Follow Up Plan:  Additional outreach attempts will be made to offer the patient care coordination information and services.   Encounter Outcome:  No Answer  Care Coordination Interventions Activated:  No   Care Coordination Interventions:  No, not indicated    Daneen Schick, BSW, CDP Social Worker, Certified Dementia Practitioner [facility] Care Management  Care Coordination [phone]

## 2022-05-04 ENCOUNTER — Ambulatory Visit: Payer: Self-pay

## 2022-05-04 NOTE — Patient Outreach (Signed)
  Care Coordination   05/04/2022 Name: [name] [name]. MRN: [id number] DOB: 1945-11-13   Care Coordination Outreach Attempts:  A third unsuccessful outreach was attempted today to offer the patient with information about available care coordination services as a benefit of their health plan.   Follow Up Plan:  No further outreach attempts will be made at this time. We have been unable to contact the patient to offer or enroll patient in care coordination services  Encounter Outcome:  No Answer   Care Coordination Interventions:  No, not indicated    Daneen Schick, BSW, CDP Social Worker, Certified Dementia Practitioner [facility]  Care Coordination [phone]

## 2022-05-17 ENCOUNTER — Emergency Department (HOSPITAL_BASED_OUTPATIENT_CLINIC_OR_DEPARTMENT_OTHER)
Admission: EM | Admit: 2022-05-17 | Discharge: 2022-05-17 | Disposition: A | Discharge status: Home / Self Care | Payer: Medicare Other | Attending: Emergency Medicine | Admitting: Emergency Medicine

## 2022-05-17 ENCOUNTER — Encounter (HOSPITAL_BASED_OUTPATIENT_CLINIC_OR_DEPARTMENT_OTHER): Payer: Self-pay

## 2022-05-17 DIAGNOSIS — J209 Acute bronchitis, unspecified: Secondary | ICD-10-CM

## 2022-05-17 DIAGNOSIS — I10 Essential (primary) hypertension: Secondary | ICD-10-CM | POA: Insufficient documentation

## 2022-05-17 DIAGNOSIS — Z79899 Other long term (current) drug therapy: Secondary | ICD-10-CM | POA: Diagnosis not present

## 2022-05-17 DIAGNOSIS — Z1152 Encounter for screening for COVID-19: Secondary | ICD-10-CM | POA: Diagnosis not present

## 2022-05-17 DIAGNOSIS — R070 Pain in throat: Secondary | ICD-10-CM | POA: Diagnosis not present

## 2022-05-17 DIAGNOSIS — J069 Acute upper respiratory infection, unspecified: Secondary | ICD-10-CM | POA: Insufficient documentation

## 2022-05-17 DIAGNOSIS — B974 Respiratory syncytial virus as the cause of diseases classified elsewhere: Secondary | ICD-10-CM | POA: Diagnosis not present

## 2022-05-17 DIAGNOSIS — Z743 Need for continuous supervision: Secondary | ICD-10-CM | POA: Diagnosis not present

## 2022-05-17 DIAGNOSIS — G4489 Other headache syndrome: Secondary | ICD-10-CM | POA: Diagnosis not present

## 2022-05-17 DIAGNOSIS — B338 Other specified viral diseases: Secondary | ICD-10-CM

## 2022-05-17 LAB — RESP PANEL BY RT-PCR (RSV, FLU A&B, COVID)  RVPGX2
Influenza A by PCR: NEGATIVE
Influenza B by PCR: NEGATIVE
Resp Syncytial Virus by PCR: POSITIVE — AB
SARS Coronavirus 2 by RT PCR: NEGATIVE

## 2022-05-17 LAB — GROUP A STREP BY PCR: Group A Strep by PCR: NOT DETECTED

## 2022-05-17 MED ORDER — DOXYCYCLINE HYCLATE 100 MG PO CAPS: 100.0000 mg | ORAL_CAPSULE | Freq: Two times a day (BID) | ORAL | 0 refills | Status: DC

## 2022-05-17 MED ORDER — DOXYCYCLINE HYCLATE 100 MG PO TABS
100.0000 mg | ORAL_TABLET | Freq: Once | ORAL | Status: AC
Start: 2022-05-17 — End: 2022-05-17
  Administered 2022-05-17: 100 mg via ORAL
  Filled 2022-05-17: qty 1

## 2022-05-17 MED ORDER — PREDNISONE 10 MG PO TABS: 20.0000 mg | ORAL_TABLET | Freq: Two times a day (BID) | ORAL | 0 refills | Status: DC

## 2022-05-17 MED ORDER — PREDNISONE 20 MG PO TABS
20.0000 mg | ORAL_TABLET | Freq: Once | ORAL | Status: AC
Start: 2022-05-17 — End: 2022-05-17
  Administered 2022-05-17: 20 mg via ORAL
  Filled 2022-05-17: qty 1

## 2022-05-17 NOTE — Discharge Instructions (Signed)
Begin taking doxycycline as prescribed.  Take over-the-counter medications as needed for relief of symptoms.  Return to the ER if symptoms significantly worsen or change.

## 2022-05-17 NOTE — ED Notes (Signed)
Patient became verbally aggressive and rude with this RN when attempting to give patient education about medications patient will receive prior to discharge and would not allow this RN to educate patient on medications and their purpose. When attempting to provide discharge education to patient, the patient became verbally aggressive and rude again while raising his voice. Patient states that he has uber on his phone and demanded that this RN needs to order him and [name] so he can get home. When this RN attempted to do so patient refused to provide information stating that it was all in the phone. This RN was able to find patient address in chart and was able to confirm with patient. Patient states that this RN must help patient to wheelchair and put him in the waiting room and have someone assist him to the [facility] vehicle when it arrives. This RN assisted patient to wheelchair and wheeled patient to waiting room, registration stated that they will assist patient to the uber when it arrives.

## 2022-05-17 NOTE — ED Triage Notes (Addendum)
Patient presents from [facility] the ALF close to this facility via [employer]. States he has been dealing with flu like symptoms x2 weeks that have progressively been getting worse including cough, sore throat, runny nose, occasional headache. States he has only taken vitamin C, denies any other OTC medications. Pt. 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

## 2022-05-17 NOTE — ED Provider Notes (Signed)
[facility] EMERGENCY DEPT Provider Note   CSN: [id number] Arrival date & time: 05/17/22  0418     History  Chief Complaint  Patient presents with   Sore Throat    [name] [name]. is a 76 y.o. male.  Patient is a 76 year old male with past medical history of glaucoma, hypertension, blindness, anemia.  Patient presenting today with complaints of chest congestion, productive cough, nasal congestion, and feeling poorly for the past 2 weeks.  He thought he had the flu, but it does not seem to be improving.  He denies to me he is having any fevers.  He denies any aggravating or alleviating factors.  He denies any ill contacts.  The history is provided by the patient.       Home Medications Prior to Admission medications   Medication Sig Start Date End Date Taking? Authorizing Provider  Ascorbic Acid (VITAMIN C WITH ROSE HIPS) 1000 MG tablet Take 2,000 mg by mouth daily with lunch.     [provider]  aspirin-acetaminophen-caffeine (EXCEDRIN MIGRAINE) [id number] MG tablet Take 1 tablet by mouth daily as needed (pain).     [provider]  B Complex Vitamins (VITAMIN-B COMPLEX PO) Take 1 tablet by mouth daily.    [provider]  brimonidine (ALPHAGAN) 0.2 % ophthalmic solution Place 1 drop into both eyes 2 (two) times daily. 03/11/19   [provider]  dorzolamide-timolol (COSOPT) 22.3-6.8 MG/ML ophthalmic solution APPOINTMENT OVERDUE, instill 1 drop in both eyes twice daily 07/14/13   [name], NP  folic acid (FOLVITE) 314 MCG tablet Take 800 mcg by mouth 2 (two) times a week. Twice a week    [provider]  hydrochlorothiazide (HYDRODIURIL) 12.5 MG tablet TAKE 1 TABLET(12.5 MG) BY MOUTH DAILY 12/07/21   Wardell Honour, MD  Iron, Ferrous Sulfate, 325 (65 Fe) MG TABS Take 325 mg by mouth daily. 11/29/20   [name], NP  lisinopril (ZESTRIL) 10 MG tablet TAKE 1 TABLET(10 MG) BY MOUTH DAILY 12/09/21   [name], NP  Magnesium 250 MG TABS Take 250 mg by mouth daily as needed (migraine headache).     [provider]  nitrofurantoin, macrocrystal-monohydrate, (MACROBID) 100 MG capsule Take 1 capsule (100 mg total) by mouth 2 (two) times daily. 10/06/21   Isla Pence, MD  pilocarpine (PILOCAR) 2 % ophthalmic solution Place 1 drop into the right eye every 6 (six) hours.  12/25/17   [provider]  potassium chloride SA (KLOR-CON) 20 MEQ tablet TAKE 1 TABLET(20 MEQ) BY MOUTH DAILY Patient taking differently: Refill must go through provider 09/20/20   [name], NP  pyridOXINE (VITAMIN B-6) 50 MG tablet Take 50 mg by mouth daily with lunch.     [provider]  vitamin B-12 (CYANOCOBALAMIN) 1000 MCG tablet Take 1,000 mcg by mouth daily with lunch.    [provider]      Allergies    Patient has no known allergies.    Review of Systems   Review of Systems  All other systems reviewed and are negative.   Physical Exam Updated Vital Signs BP (!) 147/86 (BP Location: Right Arm)   Pulse 72   Temp 98.4 F (36.9 C) (Oral)   Resp 18   Ht '5\' 7"'$  (1.702 m)   Wt 86.2 kg   SpO2 100%   BMI 29.76 kg/m  Physical Exam Vitals and nursing note reviewed.  Constitutional:      General: He  is not in acute distress.    Appearance: He is well-developed. He is not diaphoretic.  HENT:     Head: Normocephalic and atraumatic.  Cardiovascular:     Rate and Rhythm: Normal rate and regular rhythm.     Heart sounds: No murmur heard.    No friction rub.  Pulmonary:     Effort: Pulmonary effort is normal. No respiratory distress.     Breath sounds: Normal breath sounds. No wheezing or rales.  Abdominal:     General: Bowel sounds are normal. There is no distension.     Palpations: Abdomen is soft.     Tenderness: There is no abdominal tenderness.  Musculoskeletal:        General: Normal range of motion.     Cervical back: Normal range of motion and neck  supple.  Skin:    General: Skin is warm and dry.  Neurological:     Mental Status: He is alert and oriented to person, place, and time.     Coordination: Coordination normal.     ED Results / Procedures / Treatments   Labs (all labs ordered are listed, but only abnormal results are displayed) Labs Reviewed  GROUP A STREP BY PCR  RESP PANEL BY RT-PCR (RSV, FLU A&B, COVID)  [id number]    EKG None  Radiology No results found.  Procedures Procedures    Medications Ordered in ED Medications - No data to display  ED Course/ Medical Decision Making/ A&P  Patient is a 76 year old male presenting with URI symptoms.  His past medical history as listed in the HPI.  He arrives here with stable vital signs and is well-appearing.  There is no hypoxia.  Respiratory panel obtained and patient has tested negative for COVID and influenza, but RSV has returned as positive.  His symptoms are not consistent with RSV infection and have been lingering for 2 weeks.  I suspect there may be some sort of overlying bacterial infection as well and will treat with doxycycline for this.  Patient to return as needed if he worsens.  Final Clinical Impression(s) / ED Diagnoses Final diagnoses:  None    Rx / DC Orders ED Discharge Orders     None         Veryl Speak, MD 05/17/22 [phone]

## 2022-05-24 ENCOUNTER — Encounter: Payer: Self-pay | Admitting: Family Medicine

## 2022-05-24 ENCOUNTER — Ambulatory Visit (INDEPENDENT_AMBULATORY_CARE_PROVIDER_SITE_OTHER): Payer: Medicare Other | Admitting: Family Medicine

## 2022-05-24 VITALS — BP 130/84 | HR 60 | Temp 97.8°F | Ht 68.0 in | Wt 170.4 lb

## 2022-05-24 DIAGNOSIS — D62 Acute posthemorrhagic anemia: Secondary | ICD-10-CM

## 2022-05-24 DIAGNOSIS — Z23 Encounter for immunization: Secondary | ICD-10-CM | POA: Diagnosis not present

## 2022-05-24 DIAGNOSIS — B182 Chronic viral hepatitis C: Secondary | ICD-10-CM

## 2022-05-24 DIAGNOSIS — R339 Retention of urine, unspecified: Secondary | ICD-10-CM

## 2022-05-24 DIAGNOSIS — H409 Unspecified glaucoma: Secondary | ICD-10-CM | POA: Diagnosis not present

## 2022-05-24 DIAGNOSIS — I10 Essential (primary) hypertension: Secondary | ICD-10-CM

## 2022-05-24 NOTE — Progress Notes (Signed)
Provider:  Alain Honey, MD  Careteam: Patient Care Team: Wardell Honour, MD as PCP - General (Family Medicine)  PLACE OF SERVICE:  [facility] Directive information    No Known Allergies  Chief Complaint  Patient presents with   Medical Management of Chronic Issues    Medical Management of Chronic Issues. 6 Month follow up   Quality Metric Gaps    Discuss Need for Zoster,Covid, Tdap, Flu and AWV     HPI: Patient is a 76 y.o. male Here for medical management of chronic problems including hypertension, anemia, suprapubic catheter.  Regarding the catheter he has had this since 2018 or 19 after repeated Foley cath led to some scarring of the urethra.  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  He now takes care of the suprapubic catheter himself.  No history of frequent infections. He also takes lisinopril and hydrochlorothiazide for blood pressure which is well-controlled.  He takes several other vitamins.  He is not totally blind but visually impaired walks with a cane and needs help but but lives alone does his own cooking and sounds like he does pretty well.  He has lost he has lost about 30 pounds since his last visit that he attributes to change in lifestyle and eating fried foods but making foods.  Review of Systems:  Review of Systems  Constitutional:  Positive for weight loss.  Respiratory: Negative.    Cardiovascular:  Positive for leg swelling.  Gastrointestinal: Negative.   Genitourinary: Negative.   Neurological: Negative.   All other systems reviewed and are negative.   Past Medical History:  Diagnosis Date   Anemia 10/02/2019   DUE TO BLOOD LOSS   Blind    Disorder of bone and cartilage, unspecified    Edema    Elevated prostate specific antigen (PSA)    Hypertension    Hypopotassemia    Nonspecific abnormal results of liver function study    Unspecified glaucoma(365.9)    Past Surgical History:  Procedure  Laterality Date   CYSTOSCOPY WITH DIRECT VISION INTERNAL URETHROTOMY N/A 10/02/2019   Procedure: CYSTOSCOPY WITH DIRECT VISION INTERNAL URETHROTOMY WITH CLOT EVACUATION retrograd urethralgram Coverted to Open.;  Surgeon: Lucas Mallow, MD;  Location: [facility];  Service: Urology;  Laterality: N/A;   CYSTOSCOPY WITH FULGERATION N/A 01/17/2018   Procedure: CYSTOSCOPY CLOT EVACUATION WITH FULGERATION TUR BIOPSY OF BLADDER NECK;  Surgeon: Irine Seal, MD;  Location: [facility];  Service: Urology;  Laterality: N/A;   CYSTOSTOMY N/A 10/02/2019   Procedure: Open Cystostomy Suprapubic tube placement and JP drain and urethral dilation;  Surgeon: Lucas Mallow, MD;  Location: [facility];  Service: Urology;  Laterality: N/A;   EYE SURGERY Bilateral 2009   for glaucoma   EYE SURGERY Left 2014   HERNIA REPAIR Right 2003   Dr. Jeraldine Loots   IR ANGIOGRAM EXTREMITY RIGHT  10/03/2019   IR ANGIOGRAM PELVIS SELECTIVE OR SUPRASELECTIVE  10/03/2019   IR ANGIOGRAM PELVIS SELECTIVE OR SUPRASELECTIVE  10/03/2019   IR ANGIOGRAM SELECTIVE EACH ADDITIONAL VESSEL  10/03/2019   IR ANGIOGRAM SELECTIVE EACH ADDITIONAL VESSEL  10/03/2019   IR ANGIOGRAM SELECTIVE EACH ADDITIONAL VESSEL  10/03/2019   IR EMBO ART  VEN HEMORR LYMPH EXTRAV  INC GUIDE ROADMAPPING  10/03/2019   IR US GUIDE VASC ACCESS RIGHT  10/03/2019   LAPAROSCOPIC APPENDECTOMY N/A 12/24/2012   Procedure:  LAPAROSCOPIC APPENDECTOMY ;  Surgeon: Pedro Earls, MD;  Location: [facility];  Service: General;  Laterality: N/A;   TRANSURETHRAL RESECTION OF PROSTATE N/A 05/27/2019   Procedure: Cystoscopy Clot Evacuation and Fulgration;  Surgeon: Ardis Hughs, MD;  Location: [facility];  Service: Urology;  Laterality: N/A;   Social History:   reports that he quit smoking about 54 years ago. His smoking use included cigarettes. He has never used smokeless tobacco. He reports that he does not drink alcohol and does not use drugs.  Family History  Problem Relation Age of Onset    Kidney failure Mother     Medications: Patient's Medications  New Prescriptions   No medications on file  Previous Medications   ASCORBIC ACID (VITAMIN C WITH ROSE HIPS) 1000 MG TABLET    Take 2,000 mg by mouth daily with lunch.    ASPIRIN-ACETAMINOPHEN-CAFFEINE (EXCEDRIN MIGRAINE) 250-250-65 MG TABLET    Take 1 tablet by mouth daily as needed (pain).    B COMPLEX VITAMINS (VITAMIN-B COMPLEX PO)    Take 1 tablet by mouth daily.   BRIMONIDINE (ALPHAGAN) 0.2 % OPHTHALMIC SOLUTION    Place 1 drop into both eyes 2 (two) times daily.   DORZOLAMIDE-TIMOLOL (COSOPT) 22.3-6.8 MG/ML OPHTHALMIC SOLUTION    APPOINTMENT OVERDUE, instill 1 drop in both eyes twice daily   FOLIC ACID (FOLVITE) 818 MCG TABLET    Take 800 mcg by mouth 2 (two) times a week. Twice a week   HYDROCHLOROTHIAZIDE (HYDRODIURIL) 12.5 MG TABLET    TAKE 1 TABLET(12.5 MG) BY MOUTH DAILY   IRON, FERROUS SULFATE, 325 (65 FE) MG TABS    Take 325 mg by mouth daily.   LISINOPRIL (ZESTRIL) 10 MG TABLET    TAKE 1 TABLET(10 MG) BY MOUTH DAILY   MAGNESIUM 250 MG TABS    Take 250 mg by mouth daily as needed (migraine headache).    NITROFURANTOIN, MACROCRYSTAL-MONOHYDRATE, (MACROBID) 100 MG CAPSULE    Take 1 capsule (100 mg total) by mouth 2 (two) times daily.   PILOCARPINE (PILOCAR) 2 % OPHTHALMIC SOLUTION    Place 1 drop into the right eye every 6 (six) hours.    POTASSIUM CHLORIDE SA (KLOR-CON) 20 MEQ TABLET    TAKE 1 TABLET(20 MEQ) BY MOUTH DAILY   PREDNISONE (DELTASONE) 10 MG TABLET    Take 2 tablets (20 mg total) by mouth 2 (two) times daily with a meal.   PYRIDOXINE (VITAMIN B-6) 50 MG TABLET    Take 50 mg by mouth daily with lunch.    VITAMIN B-12 (CYANOCOBALAMIN) 1000 MCG TABLET    Take 1,000 mcg by mouth daily with lunch.  Modified Medications   No medications on file  Discontinued Medications   DOXYCYCLINE (VIBRAMYCIN) 100 MG CAPSULE    Take 1 capsule (100 mg total) by mouth 2 (two) times daily. One po bid x 7 days    Physical  Exam:  Vitals:   05/24/22 1115  BP: 130/84  Pulse: 60  Temp: 97.8 F (36.6 C)  SpO2: 99%  Weight: 170 lb 6.4 oz (77.3 kg)  Height: '5\' 8"'$  (1.727 m)   Body mass index is 25.91 kg/m. Wt Readings from Last 3 Encounters:  05/24/22 170 lb 6.4 oz (77.3 kg)  05/17/22 190 lb (86.2 kg)  10/06/21 197 lb 12 oz (89.7 kg)    Physical Exam Vitals and nursing note reviewed.  Constitutional:      Appearance: Normal appearance.  Cardiovascular:     Rate and Rhythm: Normal rate and regular rhythm.  Pulmonary:  Effort: Pulmonary effort is normal.     Breath sounds: Normal breath sounds.  Abdominal:     General: Bowel sounds are normal.     Palpations: Abdomen is soft.  Musculoskeletal:        General: Normal range of motion.  Neurological:     General: No focal deficit present.     Mental Status: He is alert and oriented to person, place, and time.  Psychiatric:        Mood and Affect: Mood normal.        Behavior: Behavior normal.     Labs reviewed: Basic Metabolic Panel: No results for input(s): "NA", "K", "CL", "CO2", "GLUCOSE", "BUN", "CREATININE", "CALCIUM", "MG", "PHOS", "TSH" in the last 8760 hours. Liver Function Tests: No results for input(s): "AST", "ALT", "ALKPHOS", "BILITOT", "PROT", "ALBUMIN" in the last 8760 hours. No results for input(s): "LIPASE", "AMYLASE" in the last 8760 hours. No results for input(s): "AMMONIA" in the last 8760 hours. CBC: No results for input(s): "WBC", "NEUTROABS", "HGB", "HCT", "MCV", "PLT" in the last 8760 hours. Lipid Panel: No results for input(s): "CHOL", "HDL", "LDLCALC", "TRIG", "CHOLHDL", "LDLDIRECT" in the last 8760 hours. TSH: No results for input(s): "TSH" in the last 8760 hours. A1C: No results found for: "HGBA1C"   Assessment/Plan  1. Need for immunization against influenza Received flu shot today  2. Acute blood loss anemia This was related to bleeding from the bladder.  That problem has been remedied but he  continues to take iron supplements  3. Chronic hepatitis C without hepatic coma (HCC) Has previously declined treatment  4. Primary hypertension Blood pressure is well-controlled at 130/84 on lisinopril and hydrochlorothiazide  5. Severe stage glaucoma Very low vision and requires assistance but is well adapted  6. Urinary retention Suprapubic catheter is working well.  He maintains himself   Alain Honey, MD [facility] [phone]

## 2022-11-16 ENCOUNTER — Emergency Department (HOSPITAL_BASED_OUTPATIENT_CLINIC_OR_DEPARTMENT_OTHER)
Admission: EM | Admit: 2022-11-16 | Discharge: 2022-11-16 | Disposition: A | Discharge status: Home / Self Care | Payer: Medicare Other | Attending: Emergency Medicine | Admitting: Emergency Medicine

## 2022-11-16 ENCOUNTER — Other Ambulatory Visit (HOSPITAL_BASED_OUTPATIENT_CLINIC_OR_DEPARTMENT_OTHER): Payer: Self-pay

## 2022-11-16 ENCOUNTER — Encounter (HOSPITAL_BASED_OUTPATIENT_CLINIC_OR_DEPARTMENT_OTHER): Payer: Self-pay | Admitting: Emergency Medicine

## 2022-11-16 DIAGNOSIS — T83098A Other mechanical complication of other indwelling urethral catheter, initial encounter: Secondary | ICD-10-CM | POA: Diagnosis not present

## 2022-11-16 DIAGNOSIS — Y732 Prosthetic and other implants, materials and accessory gastroenterology and urology devices associated with adverse incidents: Secondary | ICD-10-CM | POA: Diagnosis not present

## 2022-11-16 DIAGNOSIS — N3001 Acute cystitis with hematuria: Secondary | ICD-10-CM | POA: Diagnosis not present

## 2022-11-16 DIAGNOSIS — I10 Essential (primary) hypertension: Secondary | ICD-10-CM | POA: Diagnosis not present

## 2022-11-16 DIAGNOSIS — Z7982 Long term (current) use of aspirin: Secondary | ICD-10-CM | POA: Diagnosis not present

## 2022-11-16 DIAGNOSIS — T83010A Breakdown (mechanical) of cystostomy catheter, initial encounter: Secondary | ICD-10-CM

## 2022-11-16 DIAGNOSIS — R600 Localized edema: Secondary | ICD-10-CM | POA: Diagnosis not present

## 2022-11-16 DIAGNOSIS — R531 Weakness: Secondary | ICD-10-CM | POA: Diagnosis not present

## 2022-11-16 DIAGNOSIS — Z743 Need for continuous supervision: Secondary | ICD-10-CM | POA: Diagnosis not present

## 2022-11-16 DIAGNOSIS — Z79899 Other long term (current) drug therapy: Secondary | ICD-10-CM | POA: Insufficient documentation

## 2022-11-16 DIAGNOSIS — T83091A Other mechanical complication of indwelling urethral catheter, initial encounter: Secondary | ICD-10-CM | POA: Diagnosis not present

## 2022-11-16 LAB — URINALYSIS, W/ REFLEX TO CULTURE (INFECTION SUSPECTED)
Bilirubin Urine: NEGATIVE
Glucose, UA: NEGATIVE mg/dL
Ketones, ur: NEGATIVE mg/dL
Nitrite: NEGATIVE
Protein, ur: 100 mg/dL — AB
RBC / HPF: >50 RBC/hpf (ref 0–5)
Specific Gravity, Urine: 1.012 (ref 1.005–1.030)
WBC, UA: >50 WBC/hpf (ref 0–5)
pH: 6 (ref 5.0–8.0)

## 2022-11-16 MED ORDER — NITROFURANTOIN MONOHYD MACRO 100 MG PO CAPS
100.0000 mg | ORAL_CAPSULE | Freq: Two times a day (BID) | ORAL | 0 refills | Status: DC
  Filled 2022-11-16: qty 10, 5d supply, fill #0

## 2022-11-16 NOTE — Discharge Instructions (Addendum)
It was a pleasure taking care of you here in the emergency department  We have started you on some antibiotics.  Take as prescribed.  Follow-up outpatient, return for new or worsening symptoms.

## 2022-11-16 NOTE — ED Triage Notes (Signed)
Pt BIB [facility] with c/o foley catheter coming out and needing replaced.

## 2022-11-16 NOTE — ED Provider Notes (Addendum)
[place] EMERGENCY DEPARTMENT AT [facility] Provider Note   CSN: [id number] Arrival date & time: 11/16/22  1353    History  Chief Complaint  Patient presents with   Foley Catheter    [name] [name] Head. is a 77 y.o. male legal blindness, BPH, chronic suprapubic catheter, hypertension, hep C here for evaluation of dislodged suprapubic Foley catheter.  States he got out of the shower around 12:00.  He was drying off when he realized that the Foley catheter was no longer in place.  He denies any pain.  He lives by himself.  No fever, nausea, vomiting, flank pain, abdominal pain, diarrhea or dysuria.  He states last time he had his Foley catheter placed was about 1 year ago.  States he has had "normal" output from his Foley catheter.  HPI     Home Medications Prior to Admission medications   Medication Sig Start Date End Date Taking? Authorizing Provider  nitrofurantoin, macrocrystal-monohydrate, (MACROBID) 100 MG capsule Take 1 capsule (100 mg total) by mouth 2 (two) times daily. 11/16/22  Yes Farren Nelles A, PA-C  Ascorbic Acid (VITAMIN C WITH ROSE HIPS) 1000 MG tablet Take 2,000 mg by mouth daily with lunch.     [provider]  aspirin-acetaminophen-caffeine (EXCEDRIN MIGRAINE) [id number] MG tablet Take 1 tablet by mouth daily as needed (pain).     [provider]  B Complex Vitamins (VITAMIN-B COMPLEX PO) Take 1 tablet by mouth daily.    [provider]  brimonidine (ALPHAGAN) 0.2 % ophthalmic solution Place 1 drop into both eyes 2 (two) times daily. 03/11/19   [provider]  dorzolamide-timolol (COSOPT) 22.3-6.8 MG/ML ophthalmic solution APPOINTMENT OVERDUE, instill 1 drop in both eyes twice daily 07/14/13   Sharon Seller, NP  folic acid (FOLVITE) 800 MCG tablet Take 800 mcg by mouth 2 (two) times a week. Twice a week    [provider]  hydrochlorothiazide (HYDRODIURIL) 12.5 MG tablet TAKE 1 TABLET(12.5 MG) BY MOUTH  DAILY 12/07/21   Frederica Kuster, MD  Iron, Ferrous Sulfate, 325 (65 Fe) MG TABS Take 325 mg by mouth daily. 11/29/20   Sharon Seller, NP  lisinopril (ZESTRIL) 10 MG tablet TAKE 1 TABLET(10 MG) BY MOUTH DAILY 12/09/21   Sharon Seller, NP  Magnesium 250 MG TABS Take 250 mg by mouth daily as needed (migraine headache).     [provider]  pilocarpine (PILOCAR) 2 % ophthalmic solution Place 1 drop into the right eye every 6 (six) hours.  12/25/17   [provider]  potassium chloride SA (KLOR-CON) 20 MEQ tablet TAKE 1 TABLET(20 MEQ) BY MOUTH DAILY 09/20/20   Sharon Seller, NP  predniSONE (DELTASONE) 10 MG tablet Take 2 tablets (20 mg total) by mouth 2 (two) times daily with a meal. 05/17/22   Geoffery Lyons, MD  pyridOXINE (VITAMIN B-6) 50 MG tablet Take 50 mg by mouth daily with lunch.     [provider]  vitamin B-12 (CYANOCOBALAMIN) 1000 MCG tablet Take 1,000 mcg by mouth daily with lunch.    [provider]      Allergies    Patient has no known allergies.    Review of Systems   Review of Systems  Constitutional: Negative.   HENT: Negative.    Respiratory: Negative.    Cardiovascular: Negative.   Gastrointestinal: Negative.   Genitourinary: Negative.   Musculoskeletal: Negative.   Skin: Negative.   Neurological: Negative.   All other systems reviewed and  are negative.   Physical Exam Updated Vital Signs BP 126/89 (BP Location: Right Arm)   Pulse 70   Temp (!) 97.2 F (36.2 C)   Resp 14   Wt 72.6 kg   SpO2 99%   BMI 24.33 kg/m  Physical Exam Vitals and nursing note reviewed.  Constitutional:      General: He is not in acute distress.    Appearance: He is well-developed. He is not ill-appearing, toxic-appearing or diaphoretic.  HENT:     Head: Normocephalic and atraumatic.  Eyes:     Pupils: Pupils are equal, round, and reactive to light.     Comments: Cloudy cornea bilateral eyes right greater than left consistent with  cataracts  Cardiovascular:     Rate and Rhythm: Normal rate and regular rhythm.     Pulses: Normal pulses.          Radial pulses are 2+ on the right side and 2+ on the left side.       Dorsalis pedis pulses are 2+ on the right side and 2+ on the left side.     Heart sounds: Normal heart sounds.  Pulmonary:     Effort: Pulmonary effort is normal. No respiratory distress.     Breath sounds: Normal breath sounds and air entry.     Comments: Speaks in full sentences Abdominal:     General: Bowel sounds are normal. There is no distension.     Palpations: Abdomen is soft.     Tenderness: There is no abdominal tenderness. There is no guarding or rebound.       Comments: Soft, non-tender Suprapubic site to suprapubic region without erythema, warmth, drainage  Musculoskeletal:        General: Normal range of motion.     Cervical back: Full passive range of motion without pain, normal range of motion and neck supple.     Right lower leg: Edema present.     Left lower leg: Edema present.     Comments: Lower extremity edema to knees bilaterally.  Chronic venous stasis skin changes present.  No redness, warmth, ulcerations.  Skin:    General: Skin is warm and dry.  Neurological:     Mental Status: He is alert and oriented to person, place, and time. Mental status is at baseline.     Comments: Legally blind bilateral eyes    ED Results / Procedures / Treatments   Labs (all labs ordered are listed, but only abnormal results are displayed) Labs Reviewed  URINALYSIS, W/ REFLEX TO CULTURE (INFECTION SUSPECTED) - Abnormal; Notable for the following components:      Result Value   APPearance CLOUDY (*)    Hgb urine dipstick LARGE (*)    Protein, ur 100 (*)    Leukocytes,Ua LARGE (*)    Bacteria, UA MANY (*)    All other components within normal limits  URINE CULTURE    EKG None  Radiology No results found.  Procedures ASPIRATION BLADDER INSERT SUPRAPUBIC CATHETER  Date/Time:  11/16/2022 3:01 PM  Performed by: Linwood Dibbles, PA-C Authorized by: Linwood Dibbles, PA-C  Consent: Verbal consent obtained. Written consent not obtained. Risks and benefits: risks, benefits and alternatives were discussed Consent given by: patient Patient understanding: patient states understanding of the procedure being performed Patient consent: the patient's understanding of the procedure matches consent given Procedure consent: procedure consent matches procedure scheduled Relevant documents: relevant documents present and verified Test results: test results available and properly labeled Site  marked: the operative site was marked Imaging studies: imaging studies available Required items: required blood products, implants, devices, and special equipment available Patient identity confirmed: verbally with patient and arm band Time out: Immediately prior to procedure a "time out" was called to verify the correct patient, procedure, equipment, support staff and site/side marked as required. Preparation: Patient was prepped and draped in the usual sterile fashion. Local anesthesia used: no  Anesthesia: Local anesthesia used: no  Sedation: Patient sedated: no  Patient tolerance: patient tolerated the procedure well with no immediate complications Comments: 22 F suprapubic Foley catheter placed via sterile technique.  Yellow urine 300cc with scant blood in bag.       Medications Ordered in ED Medications - No data to display  ED Course/ Medical Decision Making/ A&P   77 year old multiple medical comorbidities presents emergency department via EMS for Foley catheter dislodgment.  Had gotten in the shower around 12:00 and when he got out he noticed his Foley catheter had become dislodged.  He denies any pain, redness, warmth, drainage to the area.  No systemic symptoms.  He is afebrile, nonseptic, not ill-appearing.  Suprapubic site clean, intact, leaking clear yellow urine.   No evidence of surrounding cellulitis.  No purulent drainage to suggest abscess.  His abdomen is soft, nontender.  His heart and lungs are clear.  Abdomen soft, tender.  He does have some lower extremity edema which he states is at baseline.  Some chronic venous stasis skin changes however no obvious infectious process to his lower extremities.  Will plan on replacing his Foley catheter, get UA.  Labs personally viewed and interpreted: UA large leuk, many bacteria, greater than 50 WBC  See procedure note.  Patient tolerated suprapubic replacement without difficulty.  Did have some scant blood in his Foley catheter bag with cloudy urine.  Will plan on starting antibiotics at follow-up outpatient, return for new or worsening symptoms.  Patient agreeable.  Reviewed prior urine culture, sensitive to Macrobid  The patient has been appropriately medically screened and/or stabilized in the ED. I have low suspicion for any other emergent medical condition which would require further screening, evaluation or treatment in the ED or require inpatient management.  Patient is hemodynamically stable and in no acute distress.  Patient able to ambulate in department prior to ED.  Evaluation does not show acute pathology that would require ongoing or additional emergent interventions while in the emergency department or further inpatient treatment.  I have discussed the diagnosis with the patient and answered all questions.  Pain is been managed while in the emergency department and patient has no further complaints prior to discharge.  Patient is comfortable with plan discussed in room and is stable for discharge at this time.  I have discussed strict return precautions for returning to the emergency department.  Patient was encouraged to follow-up with PCP/specialist refer to at discharge.                             Medical Decision Making Amount and/or Complexity of Data Reviewed Independent Historian:  EMS External Data Reviewed: labs, radiology and notes. Labs: ordered.  Risk OTC drugs. Prescription drug management. Decision regarding hospitalization. Diagnosis or treatment significantly limited by social determinants of health.      Final Clinical Impression(s) / ED Diagnoses Final diagnoses:  Suprapubic catheter dysfunction, initial encounter (HCC)  Acute cystitis with hematuria    Rx / DC Orders ED Discharge  Orders          Ordered    nitrofurantoin, macrocrystal-monohydrate, (MACROBID) 100 MG capsule  2 times daily        11/16/22 1504                Ginny Loomer A, PA-C 11/16/22 1520    Terald Sleeper, MD 11/16/22 1525

## 2022-11-16 NOTE — ED Notes (Signed)
Pharmacy is bringing Pt medication down to him from Pharmacy.

## 2022-11-18 LAB — URINE CULTURE: Culture: >=100000 — AB

## 2022-11-19 ENCOUNTER — Telehealth (HOSPITAL_BASED_OUTPATIENT_CLINIC_OR_DEPARTMENT_OTHER): Payer: Self-pay

## 2022-11-19 NOTE — Telephone Encounter (Signed)
Post ED Visit - Positive Culture Follow-up  Culture report reviewed by antimicrobial stewardship pharmacist: [facility] [x]  Estill Batten, Pharm.D. BCCCP []  Celedonio Miyamoto, Pharm.D., BCPS AQ-ID []  Garvin Fila, Pharm.D., BCPS []  Georgina Pillion, [address].D., BCPS []  [place], [address].D., BCPS, AAHIVP []  Estella Husk, Pharm.D., BCPS, AAHIVP []  Lysle Pearl, PharmD, BCPS []  Phillips Climes, PharmD, BCPS []  Agapito Games, PharmD, BCPS []  Verlan Friends, PharmD []  Mervyn Gay, PharmD, BCPS []  Vinnie Level, PharmD  [facility] []  Len Childs, PharmD []  Greer Pickerel, PharmD []  Adalberto Cole, PharmD []  Perlie Gold, Rph []  Lonell Face) Jean Rosenthal, PharmD []  Earl Many, PharmD []  Junita Push, PharmD []  Dorna Leitz, PharmD []  Terrilee Files, PharmD []  Lynann Beaver, PharmD []  Keturah Barre, PharmD []  Loralee Pacas, PharmD []  Bernadene Person, PharmD   Positive urine culture Treated with Nitrofurantoin Monohyd, organism sensitive to the same and no further patient follow-up is required at this time.  Sandria Senter 11/19/2022, 11:49 AM

## 2022-11-21 ENCOUNTER — Telehealth: Payer: Self-pay

## 2022-11-21 NOTE — Telephone Encounter (Signed)
Transition Care Management Unsuccessful Follow-up Telephone Call  Date of discharge and from where:  [facility] 6/13  Attempts:  1st Attempt  Reason for unsuccessful TCM follow-up call:  Left voice message   Christie Viscomi [facility] Guide, [place] [phone] [address], [place], [place] [address] Phone: [phone] Email: [email or web address]       

## 2022-11-21 NOTE — Telephone Encounter (Signed)
Transition Care Management Unsuccessful Follow-up Telephone Call  Date of discharge and from where:  [facility] 6/13  Attempts:  2nd Attempt  Reason for unsuccessful TCM follow-up call:  Unable to leave message   Lenard [facility] Guide, [facility] [phone] [address] [place], [place], [place] [address] Phone: [phone] Email: [email or web address]

## 2022-11-29 ENCOUNTER — Encounter: Payer: Medicare Other | Admitting: Family Medicine

## 2022-11-29 ENCOUNTER — Encounter: Payer: Self-pay | Admitting: Family Medicine

## 2022-12-06 NOTE — Progress Notes (Signed)
error 

## 2022-12-25 ENCOUNTER — Emergency Department (HOSPITAL_COMMUNITY)
Admission: EM | Admit: 2022-12-25 | Discharge: 2022-12-25 | Discharge status: Against Medical Advice | Payer: Medicare Other | Attending: Emergency Medicine | Admitting: Emergency Medicine

## 2022-12-25 ENCOUNTER — Encounter (HOSPITAL_COMMUNITY): Payer: Self-pay

## 2022-12-25 ENCOUNTER — Other Ambulatory Visit: Payer: Self-pay

## 2022-12-25 DIAGNOSIS — Z5329 Procedure and treatment not carried out because of patient's decision for other reasons: Secondary | ICD-10-CM | POA: Diagnosis not present

## 2022-12-25 DIAGNOSIS — Z7982 Long term (current) use of aspirin: Secondary | ICD-10-CM | POA: Insufficient documentation

## 2022-12-25 DIAGNOSIS — I499 Cardiac arrhythmia, unspecified: Secondary | ICD-10-CM | POA: Diagnosis not present

## 2022-12-25 DIAGNOSIS — I10 Essential (primary) hypertension: Secondary | ICD-10-CM | POA: Diagnosis not present

## 2022-12-25 DIAGNOSIS — R000 Tachycardia, unspecified: Secondary | ICD-10-CM | POA: Insufficient documentation

## 2022-12-25 DIAGNOSIS — R404 Transient alteration of awareness: Secondary | ICD-10-CM | POA: Diagnosis not present

## 2022-12-25 DIAGNOSIS — R6889 Other general symptoms and signs: Secondary | ICD-10-CM | POA: Diagnosis not present

## 2022-12-25 DIAGNOSIS — R42 Dizziness and giddiness: Secondary | ICD-10-CM | POA: Diagnosis not present

## 2022-12-25 NOTE — ED Provider Notes (Signed)
[name] EMERGENCY [facility] AT [facility] Provider Note   CSN: [id number] Arrival date & time: 12/25/22  0534     History  Chief Complaint  Patient presents with   Tachycardia    [name] [name]. is a 77 y.o. male.  Patient presents to the emergency department after being evaluated by EMS and being found in SVT with hypotension.  Patient reportedly felt that he may pass out and try to check his heart rate and blood pressure at home but could not find his pulse.  EMS administered 450 mL of normal saline and the patient converted back to normal sinus rhythm.  Upon arrival the patient denies any complaints.  He states he believes his symptoms are due to eating a salad earlier.  He is alert and oriented x 4 and seems capable of making medical decisions.  Past medical history significant for hypertension, legally blind, chronic anemia  HPI     Home Medications Prior to Admission medications   Medication Sig Start Date End Date Taking? Authorizing Provider  Ascorbic Acid (VITAMIN C WITH ROSE HIPS) 1000 MG tablet Take 2,000 mg by mouth daily with lunch.     [provider]  aspirin-acetaminophen-caffeine (EXCEDRIN MIGRAINE) [phone] MG tablet Take 1 tablet by mouth daily as needed (pain).     [provider]  B Complex Vitamins (VITAMIN-B COMPLEX PO) Take 1 tablet by mouth daily.    [provider]  brimonidine (ALPHAGAN) 0.2 % ophthalmic solution Place 1 drop into both eyes 2 (two) times daily. 03/11/19   [provider]  dorzolamide-timolol (COSOPT) 22.3-6.8 MG/ML ophthalmic solution APPOINTMENT OVERDUE, instill 1 drop in both eyes twice daily 07/14/13   Sharon Seller, NP  folic acid (FOLVITE) 800 MCG tablet Take 800 mcg by mouth 2 (two) times a week. Twice a week    [provider]  hydrochlorothiazide (HYDRODIURIL) 12.5 MG tablet TAKE 1 TABLET(12.5 MG) BY MOUTH DAILY 12/07/21   Frederica Kuster, MD  Iron, Ferrous Sulfate,  325 (65 Fe) MG TABS Take 325 mg by mouth daily. 11/29/20   Sharon Seller, NP  lisinopril (ZESTRIL) 10 MG tablet TAKE 1 TABLET(10 MG) BY MOUTH DAILY 12/09/21   Sharon Seller, NP  Magnesium 250 MG TABS Take 250 mg by mouth daily as needed (migraine headache).     [provider]  nitrofurantoin, macrocrystal-monohydrate, (MACROBID) 100 MG capsule Take 1 capsule (100 mg total) by mouth 2 (two) times daily. 11/16/22   Henderly, Britni A, PA-C  pilocarpine (PILOCAR) 2 % ophthalmic solution Place 1 drop into the right eye every 6 (six) hours.  12/25/17   [provider]  potassium chloride SA (KLOR-CON) 20 MEQ tablet TAKE 1 TABLET(20 MEQ) BY MOUTH DAILY 09/20/20   Sharon Seller, NP  predniSONE (DELTASONE) 10 MG tablet Take 2 tablets (20 mg total) by mouth 2 (two) times daily with a meal. 05/17/22   Geoffery Lyons, MD  pyridOXINE (VITAMIN B-6) 50 MG tablet Take 50 mg by mouth daily with lunch.     [provider]  vitamin B-12 (CYANOCOBALAMIN) 1000 MCG tablet Take 1,000 mcg by mouth daily with lunch.    [provider]      Allergies    Patient has no known allergies.    Review of Systems   Review of Systems  Physical Exam Updated Vital Signs BP (!) 142/88   Pulse (!) 104   Temp 98.2 F (36.8 C)   Resp 16  Ht 5\' 8"  (1.727 m)   Wt 72.6 kg   SpO2 100%   BMI 24.33 kg/m  Physical Exam Vitals and nursing note reviewed.  Constitutional:      General: He is not in acute distress.    Appearance: He is well-developed.  HENT:     Head: Normocephalic and atraumatic.  Eyes:     Conjunctiva/sclera: Conjunctivae normal.  Cardiovascular:     Rate and Rhythm: Normal rate and regular rhythm.     Heart sounds: No murmur heard. Pulmonary:     Effort: Pulmonary effort is normal. No respiratory distress.     Breath sounds: Normal breath sounds.  Abdominal:     Palpations: Abdomen is soft.     Tenderness: There is no abdominal tenderness.   Musculoskeletal:        General: No swelling.     Cervical back: Neck supple.     Right lower leg: No edema.     Left lower leg: No edema.  Skin:    General: Skin is warm and dry.     Capillary Refill: Capillary refill takes less than 2 seconds.  Neurological:     Mental Status: He is alert.  Psychiatric:        Mood and Affect: Mood normal.     ED Results / Procedures / Treatments   Labs (all labs ordered are listed, but only abnormal results are displayed) Labs Reviewed  CBC WITH DIFFERENTIAL/PLATELET  BASIC METABOLIC PANEL  TROPONIN I (HIGH SENSITIVITY)    EKG EKG Interpretation Date/Time:  Monday December 25 2022 05:39:32 EDT Ventricular Rate:  85 PR Interval:  234 QRS Duration:  94 QT Interval:  359 QTC Calculation: 427 R Axis:   17  Text Interpretation: Sinus rhythm Prolonged PR interval No significant change was found Confirmed by Glynn Octave [phone]) on 12/25/2022 5:54:36 AM  Radiology No results found.  Procedures Procedures    Medications Ordered in ED Medications - No data to display  ED Course/ Medical Decision Making/ A&P                             Medical Decision Making Amount and/or Complexity of Data Reviewed Labs: ordered.   This patient presents to the ED for concern of lightheadedness with hypotension, this involves an extensive number of treatment options, and is a complaint that carries with it a high risk of complications and morbidity.  The differential diagnosis includes dysrhythmia, dehydration, and others   Co morbidities that complicate the patient evaluation  Anemia   Additional history obtained:  Additional history obtained from EMS   Lab Tests:  I recommended laboratory testing including a metabolic panel and a CBC.  The patient adamantly refused   Cardiac Monitoring: / EKG:  The patient was maintained on a cardiac monitor.  I personally viewed and interpreted the cardiac monitored which showed an underlying  rhythm of: Sinus rhythm   Test / Admission - Considered:  Recommended workup with patient including CBC, BMP.  Possible further fluids.  Patient refused any care stating that his symptoms are due to eating a salad.  I explained to the patient that a salad would not cause the rapid heart rate and low blood pressure noted by EMS.  The patient stated it was the salad and that he had no chest pain and it could not be his heart.  I again explained that his heart rhythm would not have been caused  by food.  I explained that food poisoning could potentially cause low blood pressure if he had had nausea and vomiting but the patient denied nausea, vomiting.  He denies chest pain he denies shortness of breath.  He is asymptomatic at this time.  Patient continues to refuse further workup.  He does appear to have capacity to make medical decisions.  Patient will leave AMA as I do not feel that he has been appropriately worked up for discharge.         Final Clinical Impression(s) / ED Diagnoses Final diagnoses:  Lightheadedness    Rx / DC Orders ED Discharge Orders     None         Pamala Duffel 12/25/22 0102    Glynn Octave, MD 12/26/22 [phone]

## 2022-12-25 NOTE — ED Triage Notes (Signed)
Patient called 911 due to being in SVT and hypotension. Felt he was going to pass out.  Tried to check HR and BP but couldn't get it.  EMS arrived he was in SVT and hypotensive but with 450cc patient converteed on his own back to NSR.

## 2022-12-25 NOTE — ED Notes (Signed)
Patient is refusing blood work at this time. States he is anemic.

## 2022-12-25 NOTE — ED Notes (Signed)
Patient leaving AMA. Patient given form to sign and declined.  Patient stated "I have signed all that I am going to sign". However he understood he was leaving AMA. States he needs an [place] home.

## 2023-01-12 ENCOUNTER — Encounter: Payer: Self-pay | Admitting: Family Medicine

## 2023-01-15 ENCOUNTER — Encounter: Payer: Self-pay | Admitting: Family Medicine

## 2023-01-16 ENCOUNTER — Encounter: Payer: Self-pay | Admitting: Family Medicine

## 2023-04-10 ENCOUNTER — Encounter (INDEPENDENT_AMBULATORY_CARE_PROVIDER_SITE_OTHER): Payer: Medicare Other | Admitting: Sports Medicine

## 2023-04-17 ENCOUNTER — Ambulatory Visit (INDEPENDENT_AMBULATORY_CARE_PROVIDER_SITE_OTHER): Payer: Medicare Other | Admitting: Sports Medicine

## 2023-04-17 ENCOUNTER — Encounter: Payer: Self-pay | Admitting: Sports Medicine

## 2023-04-17 VITALS — BP 130/88 | HR 75 | Temp 97.5°F | Resp 17 | Ht 68.0 in | Wt 180.6 lb

## 2023-04-17 DIAGNOSIS — Z131 Encounter for screening for diabetes mellitus: Secondary | ICD-10-CM

## 2023-04-17 DIAGNOSIS — Z6827 Body mass index (BMI) 27.0-27.9, adult: Secondary | ICD-10-CM

## 2023-04-17 DIAGNOSIS — I10 Essential (primary) hypertension: Secondary | ICD-10-CM | POA: Diagnosis not present

## 2023-04-17 DIAGNOSIS — H538 Other visual disturbances: Secondary | ICD-10-CM

## 2023-04-17 DIAGNOSIS — K7469 Other cirrhosis of liver: Secondary | ICD-10-CM

## 2023-04-17 MED ORDER — DORZOLAMIDE HCL-TIMOLOL MAL 2-0.5 % OP SOLN: OPHTHALMIC | 0 refills | Status: AC

## 2023-04-17 MED ORDER — BRIMONIDINE TARTRATE 0.2 % OP SOLN: 1.0000 [drp] | Freq: Two times a day (BID) | OPHTHALMIC | 3 refills | Status: DC

## 2023-04-17 MED ORDER — HYDROCHLOROTHIAZIDE 12.5 MG PO TABS: 12.5000 mg | ORAL_TABLET | Freq: Every day | ORAL | 1 refills | Status: DC

## 2023-04-17 MED ORDER — PILOCARPINE HCL 2 % OP SOLN: 1.0000 [drp] | Freq: Four times a day (QID) | OPHTHALMIC | 0 refills | Status: DC

## 2023-04-17 NOTE — Progress Notes (Signed)
This encounter was created in error - please disregard.

## 2023-04-17 NOTE — Progress Notes (Unsigned)
Careteam: Patient Care Team: [name] Sheffield, [name] as PCP - General (Internal Medicine)  PLACE OF SERVICE:  [facility]  Advanced Directive information Does Patient Have a Medical Advance Directive?: No, Would patient like information on creating a medical advance directive?: No - Patient declined  No Known Allergies  Chief Complaint  Patient presents with   Medical Management of Chronic Issues    Routine Visit.    Immunizations    Discuss the need for Shingrix vaccine, DTAP vaccine, Covid Booster, and Influenza vaccine.    Health Maintenance    Discuss the need for AWV.     HPI: Patient is a 77 y.o. male is here for routine visit  Last seen by [name] [name] about an year ago  Has no concerns today Requesting refills on his meds Reports good appetite Weight stable  Lives alone, independent with all his ALDS and IADLS Ambulates with a cane No recent falls   HTN  130/88  Stopped lisinopril     Blurring of vision  Since last few years  Requesting to see a ophthalmology   Cirrhosis H/o hep c  CT from 2021 showed cirrhosis Pt denies abdominal pain, nausea, vomiting, hematemesis Tries to eat healthy foods Denies bloody or dark stools.   Review of Systems:  Review of Systems  Constitutional:  Negative for chills and fever.  HENT:  Negative for congestion and sore throat.   Eyes:  Negative for double vision.  Respiratory:  Negative for cough, sputum production and shortness of breath.   Cardiovascular:  Negative for chest pain, palpitations and leg swelling.  Gastrointestinal:  Negative for abdominal pain, heartburn and nausea.  Genitourinary:  Negative for dysuria, frequency and hematuria.  Musculoskeletal:  Negative for falls and myalgias.  Neurological:  Negative for dizziness, sensory change and focal weakness.    Past Medical History:  Diagnosis Date   Anemia 10/02/2019   DUE TO BLOOD LOSS   Blind    Disorder of bone and cartilage, unspecified     Edema    Elevated prostate specific antigen (PSA)    Hypertension    Hypopotassemia    Nonspecific abnormal results of liver function study    Unspecified glaucoma(365.9)    Past Surgical History:  Procedure Laterality Date   CYSTOSCOPY WITH DIRECT VISION INTERNAL URETHROTOMY N/A 10/02/2019   Procedure: CYSTOSCOPY WITH DIRECT VISION INTERNAL URETHROTOMY WITH CLOT EVACUATION retrograd urethralgram Coverted to Open.;  Surgeon: [name] [name];  Location: [facility] OR;  Service: Urology;  Laterality: N/A;   CYSTOSCOPY WITH FULGERATION N/A 01/17/2018   Procedure: CYSTOSCOPY CLOT EVACUATION WITH FULGERATION TUR BIOPSY OF BLADDER NECK;  Surgeon: [name] [name];  Location: [facility];  Service: Urology;  Laterality: N/A;   CYSTOSTOMY N/A 10/02/2019   Procedure: Open Cystostomy Suprapubic tube placement and JP drain and urethral dilation;  Surgeon: [name] [name];  Location: [facility] OR;  Service: Urology;  Laterality: N/A;   EYE SURGERY Bilateral 2009   for glaucoma   EYE SURGERY Left 2014   HERNIA REPAIR Right 2003   [name] [name]   IR ANGIOGRAM EXTREMITY RIGHT  10/03/2019   IR ANGIOGRAM PELVIS SELECTIVE OR SUPRASELECTIVE  10/03/2019   IR ANGIOGRAM PELVIS SELECTIVE OR SUPRASELECTIVE  10/03/2019   IR ANGIOGRAM SELECTIVE EACH ADDITIONAL VESSEL  10/03/2019   IR ANGIOGRAM SELECTIVE EACH ADDITIONAL VESSEL  10/03/2019   IR ANGIOGRAM SELECTIVE EACH ADDITIONAL VESSEL  10/03/2019   IR EMBO ART  VEN HEMORR LYMPH EXTRAV  INC GUIDE ROADMAPPING  10/03/2019   IR US GUIDE VASC ACCESS RIGHT  10/03/2019   LAPAROSCOPIC APPENDECTOMY N/A 12/24/2012   Procedure:  LAPAROSCOPIC APPENDECTOMY ;  Surgeon: [name] [name];  Location: [facility];  Service: General;  Laterality: N/A;   TRANSURETHRAL RESECTION OF PROSTATE N/A 05/27/2019   Procedure: Cystoscopy Clot Evacuation and Fulgration;  Surgeon: [name] [name];  Location: [facility];  Service: Urology;  Laterality: N/A;   Social History:   reports that he quit  smoking about 54 years ago. His smoking use included cigarettes. He has never used smokeless tobacco. He reports that he does not drink alcohol and does not use drugs.  Family History  Problem Relation Age of Onset   Kidney failure Mother     Medications: Patient's Medications  New Prescriptions   No medications on file  Previous Medications   ASCORBIC ACID (VITAMIN C WITH ROSE HIPS) 1000 MG TABLET    Take 2,000 mg by mouth daily with lunch.    B COMPLEX VITAMINS (VITAMIN-B COMPLEX PO)    Take 1 tablet by mouth daily.   BRIMONIDINE (ALPHAGAN) 0.2 % OPHTHALMIC SOLUTION    Place 1 drop into both eyes 2 (two) times daily.   DORZOLAMIDE-TIMOLOL (COSOPT) 22.3-6.8 MG/ML OPHTHALMIC SOLUTION    APPOINTMENT OVERDUE, instill 1 drop in both eyes twice daily   FOLIC ACID (FOLVITE) 800 MCG TABLET    Take 800 mcg by mouth 2 (two) times a week. Twice a week   HYDROCHLOROTHIAZIDE (HYDRODIURIL) 12.5 MG TABLET    TAKE 1 TABLET(12.5 MG) BY MOUTH DAILY   IRON, FERROUS SULFATE, 325 (65 FE) MG TABS    Take 325 mg by mouth daily.   LISINOPRIL (ZESTRIL) 10 MG TABLET    TAKE 1 TABLET(10 MG) BY MOUTH DAILY   MAGNESIUM 250 MG TABS    Take 250 mg by mouth daily as needed (migraine headache).    NETARSUDIL DIMESYLATE (RHOPRESSA) 0.02 % SOLN    Place 1 drop into the right eye every morning. 3am   NITROFURANTOIN, MACROCRYSTAL-MONOHYDRATE, (MACROBID) 100 MG CAPSULE    Take 1 capsule (100 mg total) by mouth 2 (two) times daily.   PILOCARPINE (PILOCAR) 2 % OPHTHALMIC SOLUTION    Place 1 drop into the right eye every 6 (six) hours.    POTASSIUM CHLORIDE SA (KLOR-CON) 20 MEQ TABLET    TAKE 1 TABLET(20 MEQ) BY MOUTH DAILY   PREDNISONE (DELTASONE) 10 MG TABLET    Take 2 tablets (20 mg total) by mouth 2 (two) times daily with a meal.   PYRIDOXINE (VITAMIN B-6) 50 MG TABLET    Take 50 mg by mouth daily with lunch.    VITAMIN B-12 (CYANOCOBALAMIN) 1000 MCG TABLET    Take 1,000 mcg by mouth daily with lunch.  Modified Medications    No medications on file  Discontinued Medications   ASPIRIN-ACETAMINOPHEN-CAFFEINE (EXCEDRIN MIGRAINE) 250-250-65 MG TABLET    Take 1 tablet by mouth daily as needed (pain).     Physical Exam:  Vitals:   04/17/23 1326  BP: 130/88  Pulse: 75  Resp: 17  Temp: (!) 97.5 F (36.4 C)  SpO2: 96%  Weight: 180 lb 9.6 oz (81.9 kg)  Height: 5\' 8"  (1.727 m)   Body mass index is 27.46 kg/m. Wt Readings from Last 3 Encounters:  04/17/23 180 lb 9.6 oz (81.9 kg)  12/25/22 160 lb (72.6 kg)  11/16/22 160 lb (72.6 kg)    Physical Exam Constitutional:  Appearance: Normal appearance.  HENT:     Head: Normocephalic and atraumatic.  Cardiovascular:     Rate and Rhythm: Normal rate and regular rhythm.     Pulses: Normal pulses.     Heart sounds: Normal heart sounds.  Pulmonary:     Effort: No respiratory distress.     Breath sounds: No stridor. No wheezing or rales.  Abdominal:     General: Bowel sounds are normal. There is no distension.     Palpations: Abdomen is soft.     Tenderness: There is no abdominal tenderness. There is no right CVA tenderness or guarding.  Musculoskeletal:        General: Swelling present.     Comments: Chronic venous insufficiency with hyperpigmentation, thickened skin  Neurological:     Mental Status: He is alert. Mental status is at baseline.     Sensory: No sensory deficit.     Motor: No weakness.     Labs reviewed: Basic Metabolic Panel: No results for input(s): "NA", "K", "CL", "CO2", "GLUCOSE", "BUN", "CREATININE", "CALCIUM", "MG", "PHOS", "TSH" in the last 8760 hours. Liver Function Tests: No results for input(s): "AST", "ALT", "ALKPHOS", "BILITOT", "PROT", "ALBUMIN" in the last 8760 hours. No results for input(s): "LIPASE", "AMYLASE" in the last 8760 hours. No results for input(s): "AMMONIA" in the last 8760 hours. CBC: No results for input(s): "WBC", "NEUTROABS", "HGB", "HCT", "MCV", "PLT" in the last 8760 hours. Lipid Panel: No results  for input(s): "CHOL", "HDL", "LDLCALC", "TRIG", "CHOLHDL", "LDLDIRECT" in the last 8760 hours. TSH: No results for input(s): "TSH" in the last 8760 hours. A1C: No results found for: "HGBA1C"   Assessment/Plan  1. Primary hypertension *** - hydrochlorothiazide (HYDRODIURIL) 12.5 MG tablet; Take 1 tablet (12.5 mg total) by mouth daily.  Dispense: 90 tablet; Refill: 1 - Lipid Panel  2. Blurring of vision Requesting refills on his eye drops - dorzolamide-timolol (COSOPT) 2-0.5 % ophthalmic solution; APPOINTMENT OVERDUE, instill 1 drop in both eyes twice daily  Dispense: 10 mL; Refill: 0 - pilocarpine (PILOCAR) 2 % ophthalmic solution; Place 1 drop into the right eye every 6 (six) hours.  Dispense: 15 mL; Refill: 0 - brimonidine (ALPHAGAN) 0.2 % ophthalmic solution; Place 1 drop into both eyes 2 (two) times daily.  Dispense: 10 mL; Refill: 3 - Lipid Panel - Ambulatory referral to Ophthalmology  3. Screening for diabetes mellitus  - Hemoglobin A1C  4. Other cirrhosis of liver (HCC) Does not want to see GI  No labs since 2022 Does not want to do blood work - CBC With Differential/Platelet - Complete Metabolic Panel with eGFR - Lipid Panel  5. BMI 27.0-27.9,adult  - Lipid Panel  Other orders - Netarsudil Dimesylate (RHOPRESSA) 0.02 % SOLN; Place 1 drop into the right eye every morning. 3am     No follow-ups on file.:

## 2023-05-09 ENCOUNTER — Other Ambulatory Visit: Payer: Self-pay | Admitting: Sports Medicine

## 2023-05-09 DIAGNOSIS — H538 Other visual disturbances: Secondary | ICD-10-CM

## 2023-08-15 ENCOUNTER — Encounter: Payer: Medicare Other | Admitting: Sports Medicine

## 2023-08-19 NOTE — Progress Notes (Signed)
 This encounter was created in error - please disregard.

## 2023-11-11 ENCOUNTER — Other Ambulatory Visit (HOSPITAL_BASED_OUTPATIENT_CLINIC_OR_DEPARTMENT_OTHER): Payer: Self-pay

## 2023-11-11 ENCOUNTER — Other Ambulatory Visit: Payer: Self-pay

## 2023-11-11 ENCOUNTER — Encounter (HOSPITAL_BASED_OUTPATIENT_CLINIC_OR_DEPARTMENT_OTHER): Payer: Self-pay

## 2023-11-11 ENCOUNTER — Emergency Department (HOSPITAL_BASED_OUTPATIENT_CLINIC_OR_DEPARTMENT_OTHER)
Admission: EM | Admit: 2023-11-11 | Discharge: 2023-11-11 | Disposition: A | Discharge status: Home / Self Care | Attending: Emergency Medicine | Admitting: Emergency Medicine

## 2023-11-11 DIAGNOSIS — I10 Essential (primary) hypertension: Secondary | ICD-10-CM | POA: Diagnosis not present

## 2023-11-11 DIAGNOSIS — Y828 Other medical devices associated with adverse incidents: Secondary | ICD-10-CM | POA: Insufficient documentation

## 2023-11-11 DIAGNOSIS — Z79899 Other long term (current) drug therapy: Secondary | ICD-10-CM | POA: Diagnosis not present

## 2023-11-11 DIAGNOSIS — T839XXA Unspecified complication of genitourinary prosthetic device, implant and graft, initial encounter: Secondary | ICD-10-CM

## 2023-11-11 DIAGNOSIS — T83091A Other mechanical complication of indwelling urethral catheter, initial encounter: Secondary | ICD-10-CM | POA: Diagnosis present

## 2023-11-11 LAB — URINALYSIS, ROUTINE W REFLEX MICROSCOPIC
Bilirubin Urine: NEGATIVE
Glucose, UA: NEGATIVE mg/dL
Ketones, ur: NEGATIVE mg/dL
Nitrite: NEGATIVE
Protein, ur: 100 mg/dL — AB
RBC / HPF: >50 RBC/hpf (ref 0–5)
Specific Gravity, Urine: 1.011 (ref 1.005–1.030)
WBC, UA: >50 WBC/hpf (ref 0–5)
pH: 8.5 — ABNORMAL HIGH (ref 5.0–8.0)

## 2023-11-11 MED ORDER — CEPHALEXIN 500 MG PO CAPS
500.0000 mg | ORAL_CAPSULE | Freq: Three times a day (TID) | ORAL | 0 refills | Status: AC
  Filled 2023-11-11: qty 15, 5d supply, fill #0

## 2023-11-11 MED ORDER — CEPHALEXIN 250 MG PO CAPS
500.0000 mg | ORAL_CAPSULE | Freq: Once | ORAL | Status: AC
  Administered 2023-11-11: 500 mg via ORAL
  Filled 2023-11-11: qty 2

## 2023-11-11 MED ORDER — LIDOCAINE HCL URETHRAL/MUCOSAL 2 % EX GEL
1.0000 | Freq: Once | CUTANEOUS | Status: AC
  Administered 2023-11-11: 1 via URETHRAL

## 2023-11-11 NOTE — ED Notes (Signed)
 Pt d/c instructions, medications, and follow-up care reviewed with pt. Pt verbalized understanding and had no further questions at time of d/c. Pt CA&Ox4, ambulatory, and in NAD at time off d/c. Pt is legally blind, requested this RN to order a 'Lyft' for him. With pt's permission, this RN ordered Lyft ride for patient to patient's home address, provided by pt. Leg bag applied to suprapubic catheter and pt provided with paper scrub pants to wear home. Pt provided education on care/catheter and verbalized understanding with no further questions at time of d/c. Pt assisted into Lyft by staff.

## 2023-11-11 NOTE — ED Provider Notes (Signed)
 [place] EMERGENCY DEPARTMENT AT [facility] Provider Note   CSN: [id number] Arrival date & time: 11/11/23  1031     History  Chief Complaint  Patient presents with   Catheter Replacement    [name] [name]. is a 78 y.o. male.  Patient here after his suprapubic Foley catheter dislodged.  History of bladder outlet obstruction.  Foley catheter came out overnight.  Denies any fevers or chills.  Denies any abdominal pain.  History of hypertension otherwise.  Patient is blind.  The history is provided by the patient.       Home Medications Prior to Admission medications   Medication Sig Start Date End Date Taking? Authorizing Provider  cephALEXin  (KEFLEX ) 500 MG capsule Take 1 capsule (500 mg total) by mouth 3 (three) times daily for 5 days. 11/11/23 11/16/23 Yes Domonic Kimball, DO  Ascorbic Acid  (VITAMIN C  WITH ROSE HIPS) 1000 MG tablet Take 2,000 mg by mouth daily with lunch.     [provider]  B Complex Vitamins (VITAMIN-B COMPLEX PO) Take 1 tablet by mouth daily.    [provider]  brimonidine  (ALPHAGAN ) 0.2 % ophthalmic solution Place 1 drop into both eyes 2 (two) times daily. 04/17/23   Tye Gall, MD  dorzolamide -timolol  (COSOPT ) 2-0.5 % ophthalmic solution APPOINTMENT OVERDUE, instill 1 drop in both eyes twice daily 04/17/23   Veludandi, Prashanthi, MD  folic acid  (FOLVITE ) 800 MCG tablet Take 800 mcg by mouth 2 (two) times a week. Twice a week    [provider]  hydrochlorothiazide  (HYDRODIURIL ) 12.5 MG tablet Take 1 tablet (12.5 mg total) by mouth daily. 04/17/23   Tye Gall, MD  Iron , Ferrous Sulfate , 325 (65 Fe) MG TABS Take 325 mg by mouth daily. 11/29/20   Eubanks, [name] K, NP  Magnesium  250 MG TABS Take 250 mg by mouth daily as needed (migraine headache).     [provider]  Netarsudil Dimesylate (RHOPRESSA) 0.02 % SOLN Place 1 drop into the right eye every morning. 3am 04/06/23   [provider]  nitrofurantoin , macrocrystal-monohydrate, (MACROBID ) 100 MG capsule Take 1 capsule (100 mg total) by mouth 2 (two) times daily. 11/16/22   Henderly, Britni A, PA-C  pilocarpine  (PILOCAR) 2 % ophthalmic solution INSTILL 1 DROP IN RIGHT EYE EVERY 6 HOURS 05/10/23   Tye Gall, MD  potassium chloride  SA (KLOR-CON ) 20 MEQ tablet TAKE 1 TABLET(20 MEQ) BY MOUTH DAILY 09/20/20   Eubanks, [name] K, NP  predniSONE  (DELTASONE ) 10 MG tablet Take 2 tablets (20 mg total) by mouth 2 (two) times daily with a meal. 05/17/22   Orvilla Blander, MD  pyridOXINE  (VITAMIN B-6) 50 MG tablet Take 50 mg by mouth daily with lunch.     [provider]  vitamin B-12 (CYANOCOBALAMIN ) 1000 MCG tablet Take 1,000 mcg by mouth daily with lunch.    [provider]      Allergies    Patient has no known allergies.    Review of Systems   Review of Systems  Physical Exam Updated Vital Signs BP 126/87   Pulse 76   Temp 98.2 F (36.8 C)   Resp 18   Ht 5\' 8"  (1.727 m)   Wt 77.1 kg   SpO2 100%   BMI 25.85 kg/m  Physical Exam Vitals and nursing note reviewed.  Constitutional:      General: He is not in acute distress.    Appearance: He is well-developed.  HENT:     Head: Normocephalic and atraumatic.  Eyes:     Conjunctiva/sclera: Conjunctivae normal.  Cardiovascular:     Rate and Rhythm: Normal rate and regular rhythm.     Heart sounds: No murmur heard. Pulmonary:     Effort: Pulmonary effort is normal. No respiratory distress.     Breath sounds: Normal breath sounds.  Abdominal:     Palpations: Abdomen is soft.     Tenderness: There is no abdominal tenderness.     Comments: Suprapubic site clean dry and intact  Musculoskeletal:        General: No swelling.     Cervical back: Neck supple.  Skin:    General: Skin is warm and dry.     Capillary Refill: Capillary refill takes less than 2 seconds.  Neurological:     Mental Status: He is alert.  Psychiatric:         Mood and Affect: Mood normal.     ED Results / Procedures / Treatments   Labs (all labs ordered are listed, but only abnormal results are displayed) Labs Reviewed  URINALYSIS, ROUTINE W REFLEX MICROSCOPIC - Abnormal; Notable for the following components:      Result Value   APPearance HAZY (*)    pH 8.5 (*)    Hgb urine dipstick LARGE (*)    Protein, ur 100 (*)    Leukocytes,Ua LARGE (*)    Bacteria, UA RARE (*)    Crystals PRESENT (*)    All other components within normal limits  URINE CULTURE    EKG None  Radiology No results found.  Procedures Procedures    Medications Ordered in ED Medications  lidocaine  (XYLOCAINE ) 2 % jelly 1 Application (1 Application Urethral Given 11/11/23 1132)  cephALEXin  (KEFLEX ) capsule 500 mg (500 mg Oral Given 11/11/23 1241)    ED Course/ Medical Decision Making/ A&P                                 Medical Decision Making Amount and/or Complexity of Data Reviewed Labs: ordered.  Risk Prescription drug management.   [name] [name]. is here after his suprapubic Foley catheter got dislodged.  Unremarkable vitals.  No abdominal tenderness.  This occurred overnight.  Is not having any discomfort or pain.  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  Suprapubic catheter put in years ago but he states.  New suprapubic Foley was placed by nursing staff.  Will check urinalysis for infection but may empirically treat for infection.  Will send urine culture.  He is asymptomatic.  Tolerated Foley placement very well.  Urinalysis somewhat equivocal for infection.  Will treat with Keflex .  Urine culture sent.  Patient discharged in good condition.  No concern for systemic process.  He is feeling well.  Foley catheter working very well.  This chart was dictated using voice recognition software.  Despite best efforts to proofread,  errors can occur which can change the documentation meaning.         Final  Clinical Impression(s) / ED Diagnoses Final diagnoses:  Problem with Foley catheter, initial encounter [facility])    Rx / DC Orders ED Discharge Orders          Ordered    cephALEXin  (KEFLEX ) 500 MG capsule  3 times daily        11/11/23 1250              [name] Rue, DO 11/11/23 1251

## 2023-11-11 NOTE — ED Triage Notes (Signed)
 Suprapubic catheter replacement. Came out around midnight.

## 2023-11-11 NOTE — Discharge Instructions (Signed)
 Pick up your antibiotics tomorrow at the pharmacy here and continue your medication.  Return if symptoms worsen.

## 2023-11-12 ENCOUNTER — Other Ambulatory Visit (HOSPITAL_BASED_OUTPATIENT_CLINIC_OR_DEPARTMENT_OTHER): Payer: Self-pay

## 2023-11-13 LAB — URINE CULTURE: Culture: >=100000 — AB

## 2023-11-14 ENCOUNTER — Telehealth (HOSPITAL_BASED_OUTPATIENT_CLINIC_OR_DEPARTMENT_OTHER): Payer: Self-pay | Admitting: *Deleted

## 2023-11-14 NOTE — Telephone Encounter (Signed)
 Post ED Visit - Positive Culture Follow-up  Culture report reviewed by antimicrobial stewardship pharmacist: [facility] [x]  Barbra Boone, [place].D. []  Skeet Duke, Pharm.D., BCPS AQ-ID []  Leslee Rase, Pharm.D., BCPS []  Garland Junk, Pharm.D., BCPS []  [place], [address].D., BCPS, AAHIVP []  Alcide Aly, Pharm.D., BCPS, AAHIVP []  Jerri Morale, PharmD, BCPS []  Graham Laws, PharmD, BCPS []  Cleda Curly, PharmD, BCPS []  Tamar Fairly, PharmD []  Ballard Levels, PharmD, BCPS []  Ollen Beverage, PharmD  [facility] []  Arlyne Bering, PharmD []  Sherryle Don, PharmD []  Van Gelinas, PharmD []  Delila Felty, Rph []  [place]) Cleora Daft, PharmD []  Augustina Block, PharmD []  Arie Kurtz, PharmD []  Sharlyn Deaner, PharmD []  Agnes Hose, PharmD []  Kendall Pauls, PharmD []  Gladstone Lamer, PharmD []  Armanda Bern, PharmD []  Tera Fellows, PharmD   Positive urine culture Treated with cephalexin , organism sensitive to the same.  Pt presented w/ foley catheter dislodgement; no urinary symptoms present.  No further patient follow-up is required at this time.  Zeb Heys 11/14/2023, 10:26 AM

## 2023-12-19 ENCOUNTER — Ambulatory Visit: Admitting: Sports Medicine

## 2024-03-14 ENCOUNTER — Other Ambulatory Visit: Payer: Self-pay | Admitting: Sports Medicine

## 2024-03-14 DIAGNOSIS — H538 Other visual disturbances: Secondary | ICD-10-CM

## 2024-03-14 DIAGNOSIS — I10 Essential (primary) hypertension: Secondary | ICD-10-CM

## 2024-03-14 NOTE — Telephone Encounter (Signed)
 Patient medication has been pend and sent to PCP [name], MD for 30 day supply. Medication has note for patient to schedule office appointment for any further refills. Medication pend and sent to PCP [name], MD for approval.

## 2024-03-21 ENCOUNTER — Emergency Department (HOSPITAL_BASED_OUTPATIENT_CLINIC_OR_DEPARTMENT_OTHER)

## 2024-03-21 ENCOUNTER — Encounter (HOSPITAL_BASED_OUTPATIENT_CLINIC_OR_DEPARTMENT_OTHER): Payer: Self-pay

## 2024-03-21 ENCOUNTER — Emergency Department (HOSPITAL_BASED_OUTPATIENT_CLINIC_OR_DEPARTMENT_OTHER)
Admission: EM | Admit: 2024-03-21 | Discharge: 2024-03-21 | Disposition: A | Discharge status: Home / Self Care | Attending: Emergency Medicine | Admitting: Emergency Medicine

## 2024-03-21 ENCOUNTER — Other Ambulatory Visit: Payer: Self-pay

## 2024-03-21 DIAGNOSIS — M7989 Other specified soft tissue disorders: Secondary | ICD-10-CM | POA: Diagnosis not present

## 2024-03-21 DIAGNOSIS — I10 Essential (primary) hypertension: Secondary | ICD-10-CM | POA: Diagnosis not present

## 2024-03-21 DIAGNOSIS — M79662 Pain in left lower leg: Secondary | ICD-10-CM | POA: Diagnosis not present

## 2024-03-21 DIAGNOSIS — Z87891 Personal history of nicotine dependence: Secondary | ICD-10-CM | POA: Diagnosis not present

## 2024-03-21 DIAGNOSIS — R2242 Localized swelling, mass and lump, left lower limb: Secondary | ICD-10-CM | POA: Diagnosis not present

## 2024-03-21 LAB — COMPREHENSIVE METABOLIC PANEL WITH GFR
ALT: 29 U/L (ref 0–44)
AST: 44 U/L — ABNORMAL HIGH (ref 15–41)
Albumin: 3.8 g/dL (ref 3.5–5.0)
Alkaline Phosphatase: 92 U/L (ref 38–126)
Anion gap: 17 — ABNORMAL HIGH (ref 5–15)
BUN: 15 mg/dL (ref 8–23)
CO2: 22 mmol/L (ref 22–32)
Calcium: 9.5 mg/dL (ref 8.9–10.3)
Chloride: 98 mmol/L (ref 98–111)
Creatinine, Ser: 1.29 mg/dL — ABNORMAL HIGH (ref 0.61–1.24)
GFR, Estimated: 57 mL/min — ABNORMAL LOW (ref >60)
Glucose, Bld: 90 mg/dL (ref 70–99)
Potassium: 3.7 mmol/L (ref 3.5–5.1)
Sodium: 137 mmol/L (ref 135–145)
Total Bilirubin: 1.1 mg/dL (ref 0.0–1.2)
Total Protein: 7.9 g/dL (ref 6.5–8.1)

## 2024-03-21 LAB — CBC WITH DIFFERENTIAL/PLATELET
Abs Immature Granulocytes: 0.05 K/uL (ref 0.00–0.07)
Basophils Absolute: 0.1 K/uL (ref 0.0–0.1)
Basophils Relative: 1 %
Eosinophils Absolute: 0 K/uL (ref 0.0–0.5)
Eosinophils Relative: 0 %
HCT: 40.5 % (ref 39.0–52.0)
Hemoglobin: 14 g/dL (ref 13.0–17.0)
Immature Granulocytes: 1 %
Lymphocytes Relative: 14 %
Lymphs Abs: 1.3 K/uL (ref 0.7–4.0)
MCH: 31.9 pg (ref 26.0–34.0)
MCHC: 34.6 g/dL (ref 30.0–36.0)
MCV: 92.3 fL (ref 80.0–100.0)
Monocytes Absolute: 0.5 K/uL (ref 0.1–1.0)
Monocytes Relative: 6 %
Neutro Abs: 7.4 K/uL (ref 1.7–7.7)
Neutrophils Relative %: 78 %
Platelets: 330 K/uL (ref 150–400)
RBC: 4.39 MIL/uL (ref 4.22–5.81)
RDW: 11.8 % (ref 11.5–15.5)
WBC: 9.3 K/uL (ref 4.0–10.5)
nRBC: 0 % (ref 0.0–0.2)

## 2024-03-21 MED ORDER — DOXYCYCLINE HYCLATE 100 MG PO CAPS: 100.0000 mg | ORAL_CAPSULE | Freq: Two times a day (BID) | ORAL | 0 refills | Status: AC

## 2024-03-21 MED ORDER — DOXYCYCLINE HYCLATE 100 MG PO TABS
100.0000 mg | ORAL_TABLET | Freq: Once | ORAL | Status: AC
  Administered 2024-03-21: 100 mg via ORAL
  Filled 2024-03-21: qty 1

## 2024-03-21 MED ORDER — DOXYCYCLINE HYCLATE 100 MG PO CAPS: 100.0000 mg | ORAL_CAPSULE | Freq: Two times a day (BID) | ORAL | 0 refills | Status: DC

## 2024-03-21 NOTE — ED Notes (Addendum)
 Note added in error

## 2024-03-21 NOTE — ED Triage Notes (Signed)
 Pt c/o L calf pain onset approx 1wk ago, I thought it was going to go away but it didn't.  5/10 tightness, a little sore. Denies fevers, SHOB, additional symptoms.

## 2024-03-21 NOTE — Discharge Instructions (Addendum)
 Take the antibiotic doxycycline  as directed for the next 7 days.  Then follow-up with your doctor.  First dose of the antibiotic given here tonight.  So you can start the rest of the antibiotic tomorrow.  The ultrasound study showed no evidence of blood clot in the legs.  But there could be an early cellulitis as the reason for the antibiotic.  Return for any new or worse symptoms.

## 2024-03-21 NOTE — ED Provider Notes (Addendum)
 [place] [facility]ERGENCY DEPARTMENT AT [facility] Provider Note   CSN: [id number] Arrival date & time: 03/21/24  1437     Patient presents with: Leg Pain (L)   [name] [name]. is a 78 y.o. male.   Patient here with a complaint of left Swelling for approximately a week.  Patient concerned about a blood clot in the leg.  Denies any fevers shortness of breath or any additional symptoms.  Patient is followed by [facility] patient is not on any blood thinners.  Past medical history significant for hypertension blind and anemia in 2021 due to blood loss.  Patient also has urinary retention and has a permanent Foley catheter in place.  Patient is a former smoker quit in 1970.  No chest pain no shortness of breath  Vital sign chair temp 97.9 pulse 87 respiration 16 blood pressure 155/72 oxygen sat 97% on room air       Prior to Admission medications   Medication Sig Start Date End Date Taking? Authorizing Provider  Ascorbic Acid  (VITAMIN C  WITH ROSE HIPS) 1000 MG tablet Take 2,000 mg by mouth daily with lunch.     [provider]  B Complex Vitamins (VITAMIN-B COMPLEX PO) Take 1 tablet by mouth daily.    [provider]  brimonidine  (ALPHAGAN ) 0.2 % ophthalmic solution INSTILL 1 DROP INTO BOTH EYES TWICE DAILY 03/14/24   [name], MD  dorzolamide -timolol  (COSOPT ) 2-0.5 % ophthalmic solution APPOINTMENT OVERDUE, instill 1 drop in both eyes twice daily 04/17/23   Veludandi, Prashanthi, MD  folic acid  (FOLVITE ) 800 MCG tablet Take 800 mcg by mouth 2 (two) times a week. Twice a week    [provider]  hydrochlorothiazide  (HYDRODIURIL ) 12.5 MG tablet TAKE 1 TABLET BY MOUTH EVERY DAY 03/14/24   [name], MD  Iron , Ferrous Sulfate , 325 (65 Fe) MG TABS Take 325 mg by mouth daily. 11/29/20   Eubanks, [name] K, NP  Magnesium  250 MG TABS Take 250 mg by mouth daily as needed (migraine headache).     [provider]   Netarsudil Dimesylate (RHOPRESSA) 0.02 % SOLN Place 1 drop into the right eye every morning. 3am 04/06/23   [provider]  nitrofurantoin , macrocrystal-monohydrate, (MACROBID ) 100 MG capsule Take 1 capsule (100 mg total) by mouth 2 (two) times daily. 11/16/22   Henderly, Britni A, PA-C  pilocarpine  (PILOCAR) 2 % ophthalmic solution INSTILL 1 DROP IN RIGHT EYE EVERY 6 HOURS 05/10/23   [name], MD  potassium chloride  SA (KLOR-CON ) 20 MEQ tablet TAKE 1 TABLET(20 MEQ) BY MOUTH DAILY 09/20/20   Eubanks, [name] K, NP  predniSONE  (DELTASONE ) 10 MG tablet Take 2 tablets (20 mg total) by mouth 2 (two) times daily with a meal. 05/17/22   Geroldine Berg, MD  pyridOXINE  (VITAMIN B-6) 50 MG tablet Take 50 mg by mouth daily with lunch.     [provider]  vitamin B-12 (CYANOCOBALAMIN ) 1000 MCG tablet Take 1,000 mcg by mouth daily with lunch.    [provider]    Allergies: Patient has no known allergies.    Review of Systems  Constitutional:  Negative for chills and fever.  HENT:  Negative for ear pain and sore throat.   Eyes:  Positive for visual disturbance. Negative for pain.  Respiratory:  Negative for cough and shortness of breath.   Cardiovascular:  Positive for leg swelling. Negative for chest pain and palpitations.  Gastrointestinal:  Negative for abdominal pain and vomiting.  Genitourinary:  Negative  for dysuria and hematuria.  Musculoskeletal:  Negative for arthralgias and back pain.  Skin:  Negative for color change and rash.  Neurological:  Negative for seizures and syncope.  All other systems reviewed and are negative.   Updated Vital Signs BP (!) 153/97   Pulse 63   Temp 98 F (36.7 C)   Resp 16   SpO2 100%   Physical Exam Vitals and nursing note reviewed.  Constitutional:      General: He is not in acute distress.    Appearance: Normal appearance. He is well-developed.  HENT:     Head: Normocephalic and atraumatic.     Mouth/Throat:      Mouth: Mucous membranes are moist.  Eyes:     Extraocular Movements: Extraocular movements intact.     Conjunctiva/sclera: Conjunctivae normal.     Pupils: Pupils are equal, round, and reactive to light.  Cardiovascular:     Rate and Rhythm: Normal rate and regular rhythm.     Heart sounds: No murmur heard. Pulmonary:     Effort: Pulmonary effort is normal. No respiratory distress.     Breath sounds: Normal breath sounds.  Abdominal:     Palpations: Abdomen is soft.     Tenderness: There is no abdominal tenderness.  Musculoskeletal:        General: No swelling.     Cervical back: Normal range of motion and neck supple.     Comments: Swelling to both legs.  Left leg with some increased swelling does have some chronic skin changes in both legs but below increased erythema to the left leg.  Cap refill in both feet.  Patient has chronic wound to the left great toe.  But he says it is baseline.  Skin:    General: Skin is warm and dry.     Capillary Refill: Capillary refill takes less than 2 seconds.  Neurological:     Mental Status: He is alert. Mental status is at baseline.  Psychiatric:        Mood and Affect: Mood normal.     (all labs ordered are listed, but only abnormal results are displayed) Labs Reviewed  COMPREHENSIVE METABOLIC PANEL WITH GFR - Abnormal; Notable for the following components:      Result Value   Creatinine, Ser 1.29 (*)    AST 44 (*)    GFR, Estimated 57 (*)    Anion gap 17 (*)    All other components within normal limits  CBC WITH DIFFERENTIAL/PLATELET    EKG: None  Radiology: No results found.   Procedures   Medications Ordered in the ED - No data to display                                  Medical Decision Making Amount and/or Complexity of Data Reviewed Labs: ordered.  Complete metabolic panel electrolytes normal GFR 57 LFTs normal other than AST of 44.  Blood sugar 90 CBC white count 9.3 hemoglobin 14 platelets are 330.  Will  get Doppler study of the left lower extremity.  Doppler of the left lower extremity negative for deep vein thrombosis.  I think the redness is probably secondary to chronic skin changes.  But there is additional erythema higher than the other leg.  Will give a trial of antibiotic doxycycline .  Will give first dose here and then prescription for him to continue tomorrow  Final diagnoses:  Left leg  swelling    ED Discharge Orders     None          Geraldene Hamilton, MD 03/21/24 1701    Geraldene Hamilton, MD 03/21/24 AVA

## 2024-03-25 ENCOUNTER — Encounter: Payer: Self-pay | Admitting: Sports Medicine

## 2024-03-25 ENCOUNTER — Ambulatory Visit (INDEPENDENT_AMBULATORY_CARE_PROVIDER_SITE_OTHER): Admitting: Sports Medicine

## 2024-03-25 VITALS — BP 130/86 | HR 114 | Temp 98.0°F | Ht 68.0 in

## 2024-03-25 DIAGNOSIS — Z131 Encounter for screening for diabetes mellitus: Secondary | ICD-10-CM

## 2024-03-25 DIAGNOSIS — Z0000 Encounter for general adult medical examination without abnormal findings: Secondary | ICD-10-CM

## 2024-03-25 DIAGNOSIS — Z9359 Other cystostomy status: Secondary | ICD-10-CM | POA: Diagnosis not present

## 2024-03-25 DIAGNOSIS — E785 Hyperlipidemia, unspecified: Secondary | ICD-10-CM | POA: Diagnosis not present

## 2024-03-25 DIAGNOSIS — I10 Essential (primary) hypertension: Secondary | ICD-10-CM | POA: Diagnosis not present

## 2024-03-25 DIAGNOSIS — Z23 Encounter for immunization: Secondary | ICD-10-CM | POA: Diagnosis not present

## 2024-03-25 DIAGNOSIS — N1831 Chronic kidney disease, stage 3a: Secondary | ICD-10-CM | POA: Diagnosis not present

## 2024-03-25 LAB — LIPID PANEL
Cholesterol: 139 mg/dL (ref <200)
HDL: 57 mg/dL (ref >=40)
LDL Cholesterol (Calc): 61 mg/dL
Non-HDL Cholesterol (Calc): 82 mg/dL (ref <130)
Total CHOL/HDL Ratio: 2.4 (calc) (ref <5.0)
Triglycerides: 120 mg/dL (ref <150)

## 2024-03-25 LAB — HEMOGLOBIN A1C
Hgb A1c MFr Bld: 4.6 % (ref <5.7)
Mean Plasma Glucose: 85 mg/dL
eAG (mmol/L): 4.7 mmol/L

## 2024-03-25 MED ORDER — HYDROCHLOROTHIAZIDE 12.5 MG PO TABS: 12.5000 mg | ORAL_TABLET | Freq: Every day | ORAL | 3 refills | Status: AC

## 2024-03-25 NOTE — Progress Notes (Unsigned)
 Careteam: Patient Care Team: [name], MD as PCP - General (Internal Medicine)  PLACE OF SERVICE:  [facility]  Advanced Directive information    No Known Allergies  Chief Complaint  Patient presents with   Annual Exam     Discussed the use of AI scribe software for clinical note transcription with the patient, who gave verbal consent to proceed.  History of Present Illness  [name] [name]. is a 78 year old male who presents for a follow-up visit. Pt went to ED recently for left calf swelling. U/S did not show DVT. He was discharged on abx which he has not picked up  from the pharmacy.   He has a suprapubic catheter, follows with urology.  .   No breathing difficulties, cough, congestion, stomach pain, heartburn, nausea, vomiting, pain with urination, or blood in urine or stool. No recent falls. Describes himself as 'strong as a bulldog.'  Pt states he is taking hydrochlorothiazide  12.5 mg, he did not bring any of his medications with him today.    Review of Systems:  Review of Systems  Constitutional:  Negative for chills and fever.  HENT:  Negative for congestion and sore throat.   Respiratory:  Negative for cough, sputum production and shortness of breath.   Cardiovascular:  Negative for chest pain, palpitations and leg swelling.  Gastrointestinal:  Negative for abdominal pain, heartburn and nausea.  Genitourinary:  Negative for dysuria, frequency and hematuria.  Musculoskeletal:  Negative for falls.  Neurological:  Negative for dizziness.   Negative unless indicated in HPI.   Past Medical History:  Diagnosis Date   Anemia 10/02/2019   DUE TO BLOOD LOSS   Blind    Disorder of bone and cartilage, unspecified    Edema    Elevated prostate specific antigen (PSA)    Hypertension    Hypopotassemia    Nonspecific abnormal results of liver function study    Unspecified glaucoma(365.9)    Past Surgical History:  Procedure Laterality Date    CYSTOSCOPY WITH DIRECT VISION INTERNAL URETHROTOMY N/A 10/02/2019   Procedure: CYSTOSCOPY WITH DIRECT VISION INTERNAL URETHROTOMY WITH CLOT EVACUATION retrograd urethralgram Coverted to Open.;  Surgeon: Carolee Sherwood JONETTA DOUGLAS, MD;  Location: [facility] OR;  Service: Urology;  Laterality: N/A;   CYSTOSCOPY WITH FULGERATION N/A 01/17/2018   Procedure: CYSTOSCOPY CLOT EVACUATION WITH FULGERATION TUR BIOPSY OF BLADDER NECK;  Surgeon: Watt Rush, MD;  Location: [facility] ORS;  Service: Urology;  Laterality: N/A;   CYSTOSTOMY N/A 10/02/2019   Procedure: Open Cystostomy Suprapubic tube placement and JP drain and urethral dilation;  Surgeon: Carolee Sherwood JONETTA DOUGLAS, MD;  Location: [facility] OR;  Service: Urology;  Laterality: N/A;   EYE SURGERY Bilateral 2009   for glaucoma   EYE SURGERY Left 2014   HERNIA REPAIR Right 2003   Dr. Riva Collet   IR ANGIOGRAM EXTREMITY RIGHT  10/03/2019   IR ANGIOGRAM PELVIS SELECTIVE OR SUPRASELECTIVE  10/03/2019   IR ANGIOGRAM PELVIS SELECTIVE OR SUPRASELECTIVE  10/03/2019   IR ANGIOGRAM SELECTIVE EACH ADDITIONAL VESSEL  10/03/2019   IR ANGIOGRAM SELECTIVE EACH ADDITIONAL VESSEL  10/03/2019   IR ANGIOGRAM SELECTIVE EACH ADDITIONAL VESSEL  10/03/2019   IR EMBO ART  VEN HEMORR LYMPH EXTRAV  INC GUIDE ROADMAPPING  10/03/2019   IR US  GUIDE VASC ACCESS RIGHT  10/03/2019   LAPAROSCOPIC APPENDECTOMY N/A 12/24/2012   Procedure:  LAPAROSCOPIC APPENDECTOMY ;  Surgeon: Donnice KATHEE Lunger, MD;  Location: [facility] ORS;  Service: General;  Laterality: N/A;  TRANSURETHRAL RESECTION OF PROSTATE N/A 05/27/2019   Procedure: Cystoscopy Clot Evacuation and Fulgration;  Surgeon: Cam Morene ORN, MD;  Location: [facility] ORS;  Service: Urology;  Laterality: N/A;   Social History:   reports that he quit smoking about 55 years ago. His smoking use included cigarettes. He has never used smokeless tobacco. He reports that he does not drink alcohol and does not use drugs.  Family History  Problem Relation Age of Onset   Kidney failure  Mother     Medications: Patient's Medications  New Prescriptions   No medications on file  Previous Medications   ASCORBIC ACID  (VITAMIN C  WITH ROSE HIPS) 1000 MG TABLET    Take 2,000 mg by mouth daily with lunch.    B COMPLEX VITAMINS (VITAMIN-B COMPLEX PO)    Take 1 tablet by mouth daily.   BRIMONIDINE  (ALPHAGAN ) 0.2 % OPHTHALMIC SOLUTION    INSTILL 1 DROP INTO BOTH EYES TWICE DAILY   DORZOLAMIDE -TIMOLOL  (COSOPT ) 2-0.5 % OPHTHALMIC SOLUTION    APPOINTMENT OVERDUE, instill 1 drop in both eyes twice daily   DOXYCYCLINE  (VIBRAMYCIN ) 100 MG CAPSULE    Take 1 capsule (100 mg total) by mouth 2 (two) times daily.   FOLIC ACID  (FOLVITE ) 800 MCG TABLET    Take 800 mcg by mouth 2 (two) times a week. Twice a week   HYDROCHLOROTHIAZIDE  (HYDRODIURIL ) 12.5 MG TABLET    TAKE 1 TABLET BY MOUTH EVERY DAY   IRON , FERROUS SULFATE , 325 (65 FE) MG TABS    Take 325 mg by mouth daily.   MAGNESIUM  250 MG TABS    Take 250 mg by mouth daily as needed (migraine headache).    NETARSUDIL DIMESYLATE (RHOPRESSA) 0.02 % SOLN    Place 1 drop into the right eye every morning. 3am   NITROFURANTOIN , MACROCRYSTAL-MONOHYDRATE, (MACROBID ) 100 MG CAPSULE    Take 1 capsule (100 mg total) by mouth 2 (two) times daily.   PILOCARPINE  (PILOCAR) 2 % OPHTHALMIC SOLUTION    INSTILL 1 DROP IN RIGHT EYE EVERY 6 HOURS   POTASSIUM CHLORIDE  SA (KLOR-CON ) 20 MEQ TABLET    TAKE 1 TABLET(20 MEQ) BY MOUTH DAILY   PREDNISONE  (DELTASONE ) 10 MG TABLET    Take 2 tablets (20 mg total) by mouth 2 (two) times daily with a meal.   PYRIDOXINE  (VITAMIN B-6) 50 MG TABLET    Take 50 mg by mouth daily with lunch.    VITAMIN B-12 (CYANOCOBALAMIN ) 1000 MCG TABLET    Take 1,000 mcg by mouth daily with lunch.  Modified Medications   No medications on file  Discontinued Medications   No medications on file    Physical Exam: Vitals:   03/25/24 1241  BP: 130/86  Pulse: (!) 114  Temp: 98 F (36.7 C)  SpO2: 99%  Height: 5' 8 (1.727 m)   Body mass  index is 25.85 kg/m. BP Readings from Last 3 Encounters:  03/25/24 130/86  03/21/24 128/86  11/11/23 (!) 159/98   Wt Readings from Last 3 Encounters:  11/11/23 170 lb (77.1 kg)  04/17/23 180 lb 9.6 oz (81.9 kg)  12/25/22 160 lb (72.6 kg)    Physical Exam Constitutional:      Appearance: Normal appearance.  HENT:     Head: Normocephalic and atraumatic.  Cardiovascular:     Rate and Rhythm: Normal rate and regular rhythm.     Pulses: Normal pulses.     Heart sounds: Normal heart sounds.  Pulmonary:     Effort: No respiratory distress.  Breath sounds: No stridor. No wheezing or rales.  Abdominal:     General: Bowel sounds are normal. There is no distension.     Palpations: Abdomen is soft.     Tenderness: There is no abdominal tenderness. There is no guarding.  Musculoskeletal:        General: No swelling.  Neurological:     Mental Status: He is alert. Mental status is at baseline.     Sensory: No sensory deficit.     Motor: No weakness.     Labs reviewed: Basic Metabolic Panel: Recent Labs    03/21/24 1456  NA 137  K 3.7  CL 98  CO2 22  GLUCOSE 90  BUN 15  CREATININE 1.29*  CALCIUM  9.5   Liver Function Tests: Recent Labs    03/21/24 1456  AST 44*  ALT 29  ALKPHOS 92  BILITOT 1.1  PROT 7.9  ALBUMIN  3.8   No results for input(s): LIPASE, AMYLASE in the last 8760 hours. No results for input(s): AMMONIA in the last 8760 hours. CBC: Recent Labs    03/21/24 1456  WBC 9.3  NEUTROABS 7.4  HGB 14.0  HCT 40.5  MCV 92.3  PLT 330   Lipid Panel: No results for input(s): CHOL, HDL, LDLCALC, TRIG, CHOLHDL, LDLDIRECT in the last 8760 hours. TSH: No results for input(s): TSH in the last 8760 hours. A1C: No results found for: HGBA1C  Assessment and Plan Assessment & Plan  1. Flu vaccine need   - Flu vaccine HIGH DOSE PF(Fluzone Trivalent)  2. Encounter for annual physical exam (Primary)  Pt had recent ED visit Left leg -  no redness or swelling Chronic skin changes + Will check labs A1c, lipid panel  3. Stage 3a chronic kidney disease (HCC)   Lab Results  Component Value Date   CREATININE 1.29 (H) 03/21/2024   CREATININE 1.01 01/03/2021   CREATININE 0.98 09/02/2020   Avoid nephrotoxic meds Increase oral hydration   4. Suprapubic catheter (HCC)  Follow up with urology  5. Primary hypertension  At goal  Refilled chtz - hydrochlorothiazide  (HYDRODIURIL ) 12.5 MG tablet; Take 1 tablet (12.5 mg total) by mouth daily.  Dispense: 90 tablet; Refill: 3  6. Screening for diabetes mellitus   - Hemoglobin A1c  7. Hyperlipidemia, unspecified hyperlipidemia type   - Lipid Panel

## 2024-03-26 ENCOUNTER — Ambulatory Visit: Payer: Self-pay | Admitting: Sports Medicine

## 2024-03-26 MED ORDER — ATORVASTATIN CALCIUM 10 MG PO TABS: 10.0000 mg | ORAL_TABLET | Freq: Every day | ORAL | 1 refills | Status: AC

## 2024-04-23 ENCOUNTER — Other Ambulatory Visit: Payer: Self-pay | Admitting: Sports Medicine

## 2024-04-23 DIAGNOSIS — H538 Other visual disturbances: Secondary | ICD-10-CM

## 2024-09-29 ENCOUNTER — Ambulatory Visit: Payer: Self-pay | Admitting: Nurse Practitioner
